# Patient Record
Sex: Female | Born: 1955
Health system: Southern US, Community
[De-identification: ages and names within clinical notes are randomized; demographics above are authoritative.]

## PROBLEM LIST (undated history)

## (undated) DIAGNOSIS — I10 Essential (primary) hypertension: Secondary | ICD-10-CM

## (undated) DIAGNOSIS — F1210 Cannabis abuse, uncomplicated: Secondary | ICD-10-CM

## (undated) DIAGNOSIS — I471 Supraventricular tachycardia, unspecified: Secondary | ICD-10-CM

## (undated) DIAGNOSIS — R079 Chest pain, unspecified: Secondary | ICD-10-CM

## (undated) DIAGNOSIS — M6530 Trigger finger, unspecified finger: Secondary | ICD-10-CM

## (undated) DIAGNOSIS — E7800 Pure hypercholesterolemia, unspecified: Secondary | ICD-10-CM

## (undated) DIAGNOSIS — E119 Type 2 diabetes mellitus without complications: Secondary | ICD-10-CM

## (undated) DIAGNOSIS — F329 Major depressive disorder, single episode, unspecified: Secondary | ICD-10-CM

## (undated) DIAGNOSIS — I340 Nonrheumatic mitral (valve) insufficiency: Secondary | ICD-10-CM

## (undated) DIAGNOSIS — F3181 Bipolar II disorder: Secondary | ICD-10-CM

## (undated) DIAGNOSIS — T7840XA Allergy, unspecified, initial encounter: Secondary | ICD-10-CM

## (undated) DIAGNOSIS — N289 Disorder of kidney and ureter, unspecified: Secondary | ICD-10-CM

## (undated) DIAGNOSIS — I351 Nonrheumatic aortic (valve) insufficiency: Secondary | ICD-10-CM

## (undated) DIAGNOSIS — I5042 Chronic combined systolic (congestive) and diastolic (congestive) heart failure: Secondary | ICD-10-CM

## (undated) DIAGNOSIS — I428 Other cardiomyopathies: Secondary | ICD-10-CM

## (undated) DIAGNOSIS — M1990 Unspecified osteoarthritis, unspecified site: Secondary | ICD-10-CM

## (undated) DIAGNOSIS — I071 Rheumatic tricuspid insufficiency: Secondary | ICD-10-CM

## (undated) DIAGNOSIS — F419 Anxiety disorder, unspecified: Secondary | ICD-10-CM

## (undated) DIAGNOSIS — H269 Unspecified cataract: Secondary | ICD-10-CM

## (undated) DIAGNOSIS — F32A Depression, unspecified: Secondary | ICD-10-CM

## (undated) DIAGNOSIS — K5900 Constipation, unspecified: Secondary | ICD-10-CM

## (undated) DIAGNOSIS — K219 Gastro-esophageal reflux disease without esophagitis: Secondary | ICD-10-CM

## (undated) DIAGNOSIS — F3170 Bipolar disorder, currently in remission, most recent episode unspecified: Secondary | ICD-10-CM

## (undated) HISTORY — DX: Major depressive disorder, single episode, unspecified: F32.9

## (undated) HISTORY — DX: Nonrheumatic mitral (valve) insufficiency: I34.0

## (undated) HISTORY — DX: Nonrheumatic aortic (valve) insufficiency: I35.1

## (undated) HISTORY — DX: Essential (primary) hypertension: I10

## (undated) HISTORY — DX: Allergy, unspecified, initial encounter: T78.40XA

## (undated) HISTORY — DX: Bipolar disorder, currently in remission, most recent episode unspecified: F31.70

## (undated) HISTORY — DX: Rheumatic tricuspid insufficiency: I07.1

## (undated) HISTORY — DX: Disorder of kidney and ureter, unspecified: N28.9

## (undated) HISTORY — DX: Other cardiomyopathies: I42.8

## (undated) HISTORY — PX: OTHER SURGICAL HISTORY: SHX169

## (undated) HISTORY — DX: Unspecified cataract: H26.9

## (undated) HISTORY — DX: Chest pain, unspecified: R07.9

## (undated) HISTORY — DX: Depression, unspecified: F32.A

## (undated) HISTORY — DX: Cannabis abuse, uncomplicated: F12.10

## (undated) HISTORY — DX: Pure hypercholesterolemia, unspecified: E78.00

## (undated) HISTORY — DX: Type 2 diabetes mellitus without complications: E11.9

## (undated) HISTORY — DX: Chronic combined systolic (congestive) and diastolic (congestive) heart failure: I50.42

## (undated) HISTORY — PX: CYSTECTOMY: SUR359

## (undated) HISTORY — DX: Trigger finger, unspecified finger: M65.30

## (undated) HISTORY — PX: COLONOSCOPY: SHX174

## (undated) HISTORY — DX: Constipation, unspecified: K59.00

## (undated) HISTORY — DX: Gastro-esophageal reflux disease without esophagitis: K21.9

## (undated) HISTORY — DX: Unspecified osteoarthritis, unspecified site: M19.90

## (undated) HISTORY — DX: Anxiety disorder, unspecified: F41.9

## (undated) HISTORY — DX: Supraventricular tachycardia: I47.1

## (undated) HISTORY — DX: Supraventricular tachycardia, unspecified: I47.10

## (undated) HISTORY — DX: Bipolar II disorder: F31.81

---

## 1984-06-16 HISTORY — PX: ABDOMINAL HYSTERECTOMY: SUR658

## 1998-12-28 ENCOUNTER — Emergency Department (HOSPITAL_COMMUNITY): Admission: EM | Admit: 1998-12-28 | Discharge: 1998-12-28 | Payer: Self-pay | Admitting: Emergency Medicine

## 1999-03-07 ENCOUNTER — Encounter: Payer: Self-pay | Admitting: Emergency Medicine

## 1999-03-07 ENCOUNTER — Emergency Department (HOSPITAL_COMMUNITY): Admission: EM | Admit: 1999-03-07 | Discharge: 1999-03-08 | Payer: Self-pay | Admitting: Emergency Medicine

## 2000-12-30 ENCOUNTER — Emergency Department (HOSPITAL_COMMUNITY): Admission: EM | Admit: 2000-12-30 | Discharge: 2000-12-30 | Payer: Self-pay | Admitting: Emergency Medicine

## 2001-04-15 ENCOUNTER — Ambulatory Visit (HOSPITAL_COMMUNITY): Admission: RE | Admit: 2001-04-15 | Discharge: 2001-04-15 | Payer: Self-pay | Admitting: Family Medicine

## 2001-09-13 ENCOUNTER — Emergency Department (HOSPITAL_COMMUNITY): Admission: EM | Admit: 2001-09-13 | Discharge: 2001-09-13 | Payer: Self-pay | Admitting: Emergency Medicine

## 2001-11-06 ENCOUNTER — Emergency Department (HOSPITAL_COMMUNITY): Admission: EM | Admit: 2001-11-06 | Discharge: 2001-11-06 | Payer: Self-pay | Admitting: Emergency Medicine

## 2001-12-21 ENCOUNTER — Emergency Department (HOSPITAL_COMMUNITY): Admission: EM | Admit: 2001-12-21 | Discharge: 2001-12-22 | Payer: Self-pay | Admitting: Emergency Medicine

## 2002-07-04 ENCOUNTER — Emergency Department (HOSPITAL_COMMUNITY): Admission: EM | Admit: 2002-07-04 | Discharge: 2002-07-04 | Payer: Self-pay | Admitting: Emergency Medicine

## 2002-07-06 ENCOUNTER — Emergency Department (HOSPITAL_COMMUNITY): Admission: EM | Admit: 2002-07-06 | Discharge: 2002-07-06 | Payer: Self-pay | Admitting: *Deleted

## 2003-06-03 ENCOUNTER — Inpatient Hospital Stay (HOSPITAL_COMMUNITY): Admission: EM | Admit: 2003-06-03 | Discharge: 2003-06-07 | Payer: Self-pay | Admitting: Emergency Medicine

## 2003-07-11 ENCOUNTER — Emergency Department (HOSPITAL_COMMUNITY): Admission: EM | Admit: 2003-07-11 | Discharge: 2003-07-11 | Payer: Self-pay | Admitting: Family Medicine

## 2003-08-07 ENCOUNTER — Encounter: Payer: Self-pay | Admitting: Emergency Medicine

## 2003-08-07 ENCOUNTER — Inpatient Hospital Stay (HOSPITAL_COMMUNITY): Admission: AD | Admit: 2003-08-07 | Discharge: 2003-08-10 | Payer: Self-pay | Admitting: Internal Medicine

## 2003-09-28 ENCOUNTER — Inpatient Hospital Stay (HOSPITAL_COMMUNITY): Admission: EM | Admit: 2003-09-28 | Discharge: 2003-09-30 | Payer: Self-pay | Admitting: Emergency Medicine

## 2003-10-02 ENCOUNTER — Encounter: Admission: RE | Admit: 2003-10-02 | Discharge: 2003-10-02 | Payer: Self-pay | Admitting: Family Medicine

## 2003-10-09 ENCOUNTER — Encounter: Admission: RE | Admit: 2003-10-09 | Discharge: 2003-10-09 | Payer: Self-pay | Admitting: Family Medicine

## 2003-10-20 ENCOUNTER — Encounter: Admission: RE | Admit: 2003-10-20 | Discharge: 2003-10-20 | Payer: Self-pay | Admitting: Family Medicine

## 2003-10-30 ENCOUNTER — Encounter: Admission: RE | Admit: 2003-10-30 | Discharge: 2003-10-30 | Payer: Self-pay | Admitting: Family Medicine

## 2003-10-30 ENCOUNTER — Encounter: Admission: RE | Admit: 2003-10-30 | Discharge: 2004-01-28 | Payer: Self-pay | Admitting: Family Medicine

## 2004-02-13 ENCOUNTER — Encounter: Admission: RE | Admit: 2004-02-13 | Discharge: 2004-02-13 | Payer: Self-pay | Admitting: Family Medicine

## 2004-04-24 ENCOUNTER — Ambulatory Visit: Payer: Self-pay | Admitting: Family Medicine

## 2004-04-24 ENCOUNTER — Observation Stay (HOSPITAL_COMMUNITY): Admission: EM | Admit: 2004-04-24 | Discharge: 2004-04-26 | Payer: Self-pay | Admitting: Emergency Medicine

## 2004-05-06 ENCOUNTER — Ambulatory Visit: Payer: Self-pay | Admitting: Sports Medicine

## 2004-05-06 HISTORY — PX: CARDIAC CATHETERIZATION: SHX172

## 2004-06-21 ENCOUNTER — Ambulatory Visit: Payer: Self-pay | Admitting: Family Medicine

## 2004-09-17 ENCOUNTER — Ambulatory Visit: Payer: Self-pay | Admitting: Family Medicine

## 2004-10-02 ENCOUNTER — Encounter: Admission: RE | Admit: 2004-10-02 | Discharge: 2004-10-02 | Payer: Self-pay | Admitting: Sports Medicine

## 2004-11-08 ENCOUNTER — Ambulatory Visit: Payer: Self-pay | Admitting: Family Medicine

## 2005-02-12 HISTORY — PX: OTHER SURGICAL HISTORY: SHX169

## 2005-06-30 ENCOUNTER — Ambulatory Visit: Payer: Self-pay | Admitting: Sports Medicine

## 2005-08-08 ENCOUNTER — Emergency Department (HOSPITAL_COMMUNITY): Admission: EM | Admit: 2005-08-08 | Discharge: 2005-08-08 | Payer: Self-pay | Admitting: Emergency Medicine

## 2005-08-12 ENCOUNTER — Ambulatory Visit: Payer: Self-pay | Admitting: Family Medicine

## 2005-12-14 ENCOUNTER — Emergency Department (HOSPITAL_COMMUNITY): Admission: EM | Admit: 2005-12-14 | Discharge: 2005-12-14 | Payer: Self-pay | Admitting: Family Medicine

## 2006-04-10 ENCOUNTER — Emergency Department (HOSPITAL_COMMUNITY): Admission: EM | Admit: 2006-04-10 | Discharge: 2006-04-10 | Payer: Self-pay | Admitting: Emergency Medicine

## 2006-04-13 ENCOUNTER — Emergency Department (HOSPITAL_COMMUNITY): Admission: EM | Admit: 2006-04-13 | Discharge: 2006-04-13 | Payer: Self-pay | Admitting: Family Medicine

## 2006-04-15 ENCOUNTER — Ambulatory Visit: Payer: Self-pay | Admitting: Family Medicine

## 2006-04-28 ENCOUNTER — Ambulatory Visit: Payer: Self-pay | Admitting: Family Medicine

## 2006-05-20 ENCOUNTER — Ambulatory Visit: Payer: Self-pay | Admitting: Family Medicine

## 2006-08-11 ENCOUNTER — Encounter (INDEPENDENT_AMBULATORY_CARE_PROVIDER_SITE_OTHER): Payer: Self-pay | Admitting: Family Medicine

## 2006-08-11 ENCOUNTER — Ambulatory Visit: Payer: Self-pay | Admitting: Family Medicine

## 2006-08-11 LAB — CONVERTED CEMR LAB
Calcium: 9.2 mg/dL (ref 8.4–10.5)
Potassium: 3.9 meq/L (ref 3.5–5.3)
Sodium: 141 meq/L (ref 135–145)

## 2006-08-13 DIAGNOSIS — G4700 Insomnia, unspecified: Secondary | ICD-10-CM | POA: Insufficient documentation

## 2006-08-13 DIAGNOSIS — G44209 Tension-type headache, unspecified, not intractable: Secondary | ICD-10-CM | POA: Insufficient documentation

## 2006-08-13 DIAGNOSIS — E1169 Type 2 diabetes mellitus with other specified complication: Secondary | ICD-10-CM | POA: Insufficient documentation

## 2006-08-13 DIAGNOSIS — F339 Major depressive disorder, recurrent, unspecified: Secondary | ICD-10-CM | POA: Insufficient documentation

## 2006-08-13 DIAGNOSIS — E785 Hyperlipidemia, unspecified: Secondary | ICD-10-CM

## 2006-08-13 DIAGNOSIS — K219 Gastro-esophageal reflux disease without esophagitis: Secondary | ICD-10-CM | POA: Insufficient documentation

## 2006-08-14 ENCOUNTER — Inpatient Hospital Stay (HOSPITAL_COMMUNITY): Admission: AD | Admit: 2006-08-14 | Discharge: 2006-08-15 | Payer: Self-pay

## 2006-08-14 ENCOUNTER — Emergency Department (HOSPITAL_COMMUNITY): Admission: EM | Admit: 2006-08-14 | Discharge: 2006-08-14 | Payer: Self-pay | Admitting: Emergency Medicine

## 2006-08-18 ENCOUNTER — Encounter (INDEPENDENT_AMBULATORY_CARE_PROVIDER_SITE_OTHER): Payer: Self-pay | Admitting: Family Medicine

## 2006-08-18 ENCOUNTER — Ambulatory Visit: Payer: Self-pay | Admitting: Family Medicine

## 2006-08-18 DIAGNOSIS — F17200 Nicotine dependence, unspecified, uncomplicated: Secondary | ICD-10-CM

## 2006-08-18 DIAGNOSIS — IMO0002 Reserved for concepts with insufficient information to code with codable children: Secondary | ICD-10-CM | POA: Insufficient documentation

## 2006-08-18 DIAGNOSIS — E1165 Type 2 diabetes mellitus with hyperglycemia: Secondary | ICD-10-CM

## 2006-08-18 DIAGNOSIS — Z87891 Personal history of nicotine dependence: Secondary | ICD-10-CM | POA: Insufficient documentation

## 2006-08-18 LAB — CONVERTED CEMR LAB
ALT: 10 units/L (ref 0–35)
Albumin: 4.1 g/dL (ref 3.5–5.2)
CO2: 24 meq/L (ref 19–32)
Calcium: 9.6 mg/dL (ref 8.4–10.5)
Chloride: 103 meq/L (ref 96–112)
Glucose, Bld: 181 mg/dL — ABNORMAL HIGH (ref 70–99)
Potassium: 4.5 meq/L (ref 3.5–5.3)
Sodium: 137 meq/L (ref 135–145)
Total Bilirubin: 0.3 mg/dL (ref 0.3–1.2)
Total CK: 40 units/L (ref 7–177)
Total Protein: 8 g/dL (ref 6.0–8.3)

## 2006-08-28 ENCOUNTER — Ambulatory Visit: Payer: Self-pay | Admitting: Family Medicine

## 2006-09-03 ENCOUNTER — Ambulatory Visit: Payer: Self-pay | Admitting: Family Medicine

## 2006-09-09 ENCOUNTER — Telehealth: Payer: Self-pay | Admitting: *Deleted

## 2006-09-10 ENCOUNTER — Ambulatory Visit: Payer: Self-pay | Admitting: Family Medicine

## 2006-09-22 ENCOUNTER — Encounter (INDEPENDENT_AMBULATORY_CARE_PROVIDER_SITE_OTHER): Payer: Self-pay | Admitting: *Deleted

## 2006-09-22 ENCOUNTER — Ambulatory Visit: Payer: Self-pay | Admitting: Sports Medicine

## 2006-12-23 ENCOUNTER — Ambulatory Visit: Payer: Self-pay | Admitting: Family Medicine

## 2007-01-14 ENCOUNTER — Encounter (INDEPENDENT_AMBULATORY_CARE_PROVIDER_SITE_OTHER): Payer: Self-pay | Admitting: Family Medicine

## 2007-01-29 ENCOUNTER — Ambulatory Visit: Payer: Self-pay | Admitting: Family Medicine

## 2007-01-29 DIAGNOSIS — E1143 Type 2 diabetes mellitus with diabetic autonomic (poly)neuropathy: Secondary | ICD-10-CM | POA: Insufficient documentation

## 2007-02-01 ENCOUNTER — Encounter (INDEPENDENT_AMBULATORY_CARE_PROVIDER_SITE_OTHER): Payer: Self-pay | Admitting: Family Medicine

## 2007-02-03 ENCOUNTER — Encounter (INDEPENDENT_AMBULATORY_CARE_PROVIDER_SITE_OTHER): Payer: Self-pay | Admitting: Family Medicine

## 2007-02-22 ENCOUNTER — Encounter (INDEPENDENT_AMBULATORY_CARE_PROVIDER_SITE_OTHER): Payer: Self-pay | Admitting: Family Medicine

## 2007-05-08 ENCOUNTER — Emergency Department (HOSPITAL_COMMUNITY): Admission: EM | Admit: 2007-05-08 | Discharge: 2007-05-08 | Payer: Self-pay | Admitting: Family Medicine

## 2007-05-11 ENCOUNTER — Emergency Department (HOSPITAL_COMMUNITY): Admission: EM | Admit: 2007-05-11 | Discharge: 2007-05-11 | Payer: Self-pay | Admitting: Emergency Medicine

## 2007-05-17 ENCOUNTER — Ambulatory Visit: Payer: Self-pay | Admitting: Sports Medicine

## 2007-05-18 ENCOUNTER — Ambulatory Visit: Payer: Self-pay | Admitting: Sports Medicine

## 2007-05-25 ENCOUNTER — Ambulatory Visit: Payer: Self-pay | Admitting: Family Medicine

## 2007-06-07 ENCOUNTER — Ambulatory Visit: Payer: Self-pay | Admitting: Sports Medicine

## 2008-02-02 ENCOUNTER — Telehealth: Payer: Self-pay | Admitting: *Deleted

## 2008-02-14 ENCOUNTER — Emergency Department (HOSPITAL_COMMUNITY): Admission: EM | Admit: 2008-02-14 | Discharge: 2008-02-14 | Payer: Self-pay | Admitting: Emergency Medicine

## 2008-02-16 ENCOUNTER — Ambulatory Visit: Payer: Self-pay | Admitting: Family Medicine

## 2008-03-30 ENCOUNTER — Telehealth (INDEPENDENT_AMBULATORY_CARE_PROVIDER_SITE_OTHER): Payer: Self-pay | Admitting: *Deleted

## 2008-04-19 ENCOUNTER — Telehealth: Payer: Self-pay | Admitting: Family Medicine

## 2008-04-19 ENCOUNTER — Ambulatory Visit: Payer: Self-pay | Admitting: Family Medicine

## 2008-04-21 ENCOUNTER — Encounter: Payer: Self-pay | Admitting: Family Medicine

## 2008-05-03 ENCOUNTER — Telehealth: Payer: Self-pay | Admitting: *Deleted

## 2008-05-04 ENCOUNTER — Ambulatory Visit: Payer: Self-pay | Admitting: Sports Medicine

## 2008-05-04 ENCOUNTER — Encounter (INDEPENDENT_AMBULATORY_CARE_PROVIDER_SITE_OTHER): Payer: Self-pay | Admitting: Family Medicine

## 2008-05-04 DIAGNOSIS — M25549 Pain in joints of unspecified hand: Secondary | ICD-10-CM | POA: Insufficient documentation

## 2008-05-05 ENCOUNTER — Encounter: Payer: Self-pay | Admitting: Family Medicine

## 2008-07-10 ENCOUNTER — Ambulatory Visit: Payer: Self-pay | Admitting: Family Medicine

## 2008-07-10 ENCOUNTER — Ambulatory Visit (HOSPITAL_COMMUNITY): Admission: RE | Admit: 2008-07-10 | Discharge: 2008-07-10 | Payer: Self-pay | Admitting: Family Medicine

## 2008-07-14 ENCOUNTER — Encounter: Payer: Self-pay | Admitting: Family Medicine

## 2008-08-01 ENCOUNTER — Encounter: Payer: Self-pay | Admitting: Family Medicine

## 2008-08-01 ENCOUNTER — Ambulatory Visit: Payer: Self-pay | Admitting: Family Medicine

## 2008-08-01 LAB — CONVERTED CEMR LAB
ALT: 23 units/L (ref 0–35)
AST: 16 units/L (ref 0–37)
Albumin: 4.4 g/dL (ref 3.5–5.2)
Alkaline Phosphatase: 147 units/L — ABNORMAL HIGH (ref 39–117)
BUN: 15 mg/dL (ref 6–23)
Chlamydia, DNA Probe: NEGATIVE
GC Probe Amp, Genital: NEGATIVE
HDL: 58 mg/dL (ref >39)
LDL Cholesterol: 173 mg/dL — ABNORMAL HIGH (ref 0–99)
Potassium: 4 meq/L (ref 3.5–5.3)
TSH: 2.062 microintl units/mL (ref 0.350–4.50)
Total CHOL/HDL Ratio: 4.7

## 2008-08-21 ENCOUNTER — Ambulatory Visit: Payer: Self-pay | Admitting: Family Medicine

## 2008-08-21 DIAGNOSIS — M129 Arthropathy, unspecified: Secondary | ICD-10-CM | POA: Insufficient documentation

## 2008-08-23 ENCOUNTER — Encounter: Payer: Self-pay | Admitting: Family Medicine

## 2008-08-23 ENCOUNTER — Ambulatory Visit: Payer: Self-pay | Admitting: Family Medicine

## 2008-09-25 ENCOUNTER — Encounter: Payer: Self-pay | Admitting: Family Medicine

## 2009-01-24 ENCOUNTER — Ambulatory Visit: Payer: Self-pay | Admitting: Family Medicine

## 2009-01-31 ENCOUNTER — Ambulatory Visit: Payer: Self-pay | Admitting: Family Medicine

## 2009-01-31 ENCOUNTER — Encounter: Payer: Self-pay | Admitting: Family Medicine

## 2009-02-05 ENCOUNTER — Ambulatory Visit: Payer: Self-pay

## 2009-02-05 DIAGNOSIS — M6530 Trigger finger, unspecified finger: Secondary | ICD-10-CM | POA: Insufficient documentation

## 2009-02-05 DIAGNOSIS — M654 Radial styloid tenosynovitis [de Quervain]: Secondary | ICD-10-CM | POA: Insufficient documentation

## 2009-02-12 ENCOUNTER — Encounter: Payer: Self-pay | Admitting: *Deleted

## 2009-02-14 ENCOUNTER — Ambulatory Visit: Payer: Self-pay | Admitting: Family Medicine

## 2009-03-01 ENCOUNTER — Ambulatory Visit: Payer: Self-pay | Admitting: Family Medicine

## 2009-03-05 ENCOUNTER — Ambulatory Visit: Payer: Self-pay | Admitting: Family Medicine

## 2009-03-06 ENCOUNTER — Encounter: Payer: Self-pay | Admitting: *Deleted

## 2009-03-12 ENCOUNTER — Ambulatory Visit: Payer: Self-pay | Admitting: Family Medicine

## 2009-03-12 ENCOUNTER — Encounter: Payer: Self-pay | Admitting: Family Medicine

## 2009-03-14 ENCOUNTER — Ambulatory Visit (HOSPITAL_COMMUNITY): Admission: RE | Admit: 2009-03-14 | Discharge: 2009-03-14 | Payer: Self-pay | Admitting: Family Medicine

## 2009-03-22 ENCOUNTER — Ambulatory Visit: Payer: Self-pay | Admitting: Family Medicine

## 2009-03-22 DIAGNOSIS — K3184 Gastroparesis: Secondary | ICD-10-CM | POA: Insufficient documentation

## 2009-03-23 ENCOUNTER — Encounter: Payer: Self-pay | Admitting: Family Medicine

## 2009-04-27 ENCOUNTER — Ambulatory Visit: Payer: Self-pay | Admitting: Family Medicine

## 2009-05-14 ENCOUNTER — Encounter: Admission: RE | Admit: 2009-05-14 | Discharge: 2009-06-13 | Payer: Self-pay | Admitting: Family Medicine

## 2009-05-15 ENCOUNTER — Encounter: Payer: Self-pay | Admitting: Family Medicine

## 2009-05-16 ENCOUNTER — Emergency Department (HOSPITAL_COMMUNITY): Admission: EM | Admit: 2009-05-16 | Discharge: 2009-05-17 | Payer: Self-pay | Admitting: Emergency Medicine

## 2009-05-18 ENCOUNTER — Emergency Department (HOSPITAL_COMMUNITY): Admission: EM | Admit: 2009-05-18 | Discharge: 2009-05-18 | Payer: Self-pay | Admitting: Emergency Medicine

## 2009-06-04 ENCOUNTER — Ambulatory Visit: Payer: Self-pay | Admitting: Family Medicine

## 2009-06-04 DIAGNOSIS — M7500 Adhesive capsulitis of unspecified shoulder: Secondary | ICD-10-CM | POA: Insufficient documentation

## 2009-06-21 ENCOUNTER — Encounter: Admission: RE | Admit: 2009-06-21 | Discharge: 2009-09-19 | Payer: Self-pay | Admitting: Family Medicine

## 2009-07-03 ENCOUNTER — Encounter: Payer: Self-pay | Admitting: Family Medicine

## 2009-07-03 LAB — HM DIABETES EYE EXAM

## 2009-07-04 ENCOUNTER — Encounter: Admission: RE | Admit: 2009-07-04 | Discharge: 2009-08-15 | Payer: Self-pay | Admitting: Family Medicine

## 2009-07-18 ENCOUNTER — Ambulatory Visit: Payer: Self-pay | Admitting: Family Medicine

## 2009-07-18 ENCOUNTER — Encounter: Payer: Self-pay | Admitting: Family Medicine

## 2009-08-06 ENCOUNTER — Encounter: Payer: Self-pay | Admitting: *Deleted

## 2009-08-06 ENCOUNTER — Ambulatory Visit: Payer: Self-pay | Admitting: Family Medicine

## 2009-08-06 DIAGNOSIS — M797 Fibromyalgia: Secondary | ICD-10-CM | POA: Insufficient documentation

## 2009-08-10 ENCOUNTER — Ambulatory Visit: Payer: Self-pay | Admitting: Family Medicine

## 2009-08-16 ENCOUNTER — Encounter: Payer: Self-pay | Admitting: Family Medicine

## 2009-08-23 ENCOUNTER — Encounter: Payer: Self-pay | Admitting: Family Medicine

## 2009-08-23 DIAGNOSIS — F3189 Other bipolar disorder: Secondary | ICD-10-CM | POA: Insufficient documentation

## 2009-08-29 ENCOUNTER — Ambulatory Visit: Payer: Self-pay | Admitting: Family Medicine

## 2009-08-29 ENCOUNTER — Encounter: Payer: Self-pay | Admitting: Family Medicine

## 2009-08-29 ENCOUNTER — Telehealth: Payer: Self-pay | Admitting: Family Medicine

## 2009-08-29 DIAGNOSIS — L608 Other nail disorders: Secondary | ICD-10-CM | POA: Insufficient documentation

## 2009-08-30 ENCOUNTER — Encounter: Payer: Self-pay | Admitting: Family Medicine

## 2009-11-05 ENCOUNTER — Ambulatory Visit: Payer: Self-pay | Admitting: Family Medicine

## 2009-11-16 ENCOUNTER — Ambulatory Visit: Payer: Self-pay | Admitting: Family Medicine

## 2009-12-08 ENCOUNTER — Emergency Department (HOSPITAL_COMMUNITY): Admission: EM | Admit: 2009-12-08 | Discharge: 2009-12-08 | Payer: Self-pay | Admitting: Family Medicine

## 2009-12-11 ENCOUNTER — Encounter: Payer: Self-pay | Admitting: Family Medicine

## 2009-12-20 ENCOUNTER — Encounter: Payer: Self-pay | Admitting: Family Medicine

## 2009-12-20 ENCOUNTER — Ambulatory Visit: Payer: Self-pay | Admitting: Family Medicine

## 2009-12-20 DIAGNOSIS — L293 Anogenital pruritus, unspecified: Secondary | ICD-10-CM | POA: Insufficient documentation

## 2009-12-20 LAB — CONVERTED CEMR LAB: Whiff Test: NEGATIVE

## 2009-12-24 ENCOUNTER — Telehealth: Payer: Self-pay | Admitting: Family Medicine

## 2009-12-24 LAB — CONVERTED CEMR LAB
Chloride: 101 meq/L (ref 96–112)
Creatinine, Ser: 0.74 mg/dL (ref 0.40–1.20)
HDL: 52 mg/dL (ref >39)
LDL Cholesterol: 218 mg/dL — ABNORMAL HIGH (ref 0–99)
Potassium: 4.6 meq/L (ref 3.5–5.3)
Sodium: 138 meq/L (ref 135–145)
Triglycerides: 85 mg/dL (ref <150)
VLDL: 17 mg/dL (ref 0–40)

## 2009-12-27 ENCOUNTER — Ambulatory Visit: Payer: Self-pay | Admitting: Family Medicine

## 2009-12-27 ENCOUNTER — Encounter: Payer: Self-pay | Admitting: Family Medicine

## 2009-12-27 LAB — CONVERTED CEMR LAB
AST: 14 units/L (ref 0–37)
Bilirubin, Direct: 0.1 mg/dL (ref 0.0–0.3)
Total Bilirubin: 0.6 mg/dL (ref 0.3–1.2)
Total CHOL/HDL Ratio: 4.8

## 2010-01-02 ENCOUNTER — Encounter: Payer: Self-pay | Admitting: Family Medicine

## 2010-01-02 ENCOUNTER — Telehealth: Payer: Self-pay | Admitting: Family Medicine

## 2010-01-02 ENCOUNTER — Ambulatory Visit: Payer: Self-pay | Admitting: Family Medicine

## 2010-01-02 LAB — CONVERTED CEMR LAB
BUN: 10 mg/dL (ref 6–23)
Chloride: 102 meq/L (ref 96–112)
Glucose, Urine, Semiquant: >=1000
Potassium: 3.5 meq/L (ref 3.5–5.3)

## 2010-01-07 ENCOUNTER — Telehealth: Payer: Self-pay | Admitting: Family Medicine

## 2010-01-17 ENCOUNTER — Ambulatory Visit: Payer: Self-pay | Admitting: Family Medicine

## 2010-01-17 DIAGNOSIS — H5310 Unspecified subjective visual disturbances: Secondary | ICD-10-CM | POA: Insufficient documentation

## 2010-01-17 LAB — HM DIABETES FOOT EXAM

## 2010-01-30 ENCOUNTER — Ambulatory Visit: Payer: Self-pay | Admitting: Family Medicine

## 2010-02-08 ENCOUNTER — Encounter: Payer: Self-pay | Admitting: *Deleted

## 2010-02-19 ENCOUNTER — Encounter: Payer: Self-pay | Admitting: Family Medicine

## 2010-02-19 ENCOUNTER — Ambulatory Visit: Payer: Self-pay | Admitting: Family Medicine

## 2010-02-19 LAB — CONVERTED CEMR LAB
ALT: 10 units/L (ref 0–35)
AST: 12 units/L (ref 0–37)
Albumin/Creatinine Ratio, Urine, POC: <30
Albumin: 4 g/dL (ref 3.5–5.2)
Alkaline Phosphatase: 100 units/L (ref 39–117)
Creatinine,U: 100 mg/dL
Glucose, Bld: 74 mg/dL (ref 70–99)
Microalbumin U total vol: 30 mg/L
Potassium: 4.4 meq/L (ref 3.5–5.3)
Sodium: 142 meq/L (ref 135–145)
Total Protein: 7.8 g/dL (ref 6.0–8.3)

## 2010-03-01 ENCOUNTER — Encounter (INDEPENDENT_AMBULATORY_CARE_PROVIDER_SITE_OTHER): Payer: Self-pay | Admitting: *Deleted

## 2010-03-01 ENCOUNTER — Ambulatory Visit: Payer: Self-pay

## 2010-04-13 ENCOUNTER — Emergency Department (HOSPITAL_COMMUNITY): Admission: EM | Admit: 2010-04-13 | Discharge: 2010-04-13 | Payer: Self-pay | Admitting: Emergency Medicine

## 2010-04-15 ENCOUNTER — Encounter (INDEPENDENT_AMBULATORY_CARE_PROVIDER_SITE_OTHER): Payer: Self-pay | Admitting: *Deleted

## 2010-04-15 ENCOUNTER — Ambulatory Visit: Payer: Self-pay | Admitting: Family Medicine

## 2010-04-22 ENCOUNTER — Telehealth: Payer: Self-pay | Admitting: *Deleted

## 2010-04-23 ENCOUNTER — Ambulatory Visit: Payer: Self-pay | Admitting: Family Medicine

## 2010-05-13 ENCOUNTER — Encounter
Admission: RE | Admit: 2010-05-13 | Discharge: 2010-06-13 | Payer: Self-pay | Source: Home / Self Care | Attending: Family Medicine | Admitting: Family Medicine

## 2010-05-22 ENCOUNTER — Ambulatory Visit: Payer: Self-pay | Admitting: Family Medicine

## 2010-05-22 ENCOUNTER — Ambulatory Visit (HOSPITAL_COMMUNITY)
Admission: RE | Admit: 2010-05-22 | Discharge: 2010-05-22 | Payer: Self-pay | Source: Home / Self Care | Admitting: Family Medicine

## 2010-05-22 ENCOUNTER — Encounter: Payer: Self-pay | Admitting: Family Medicine

## 2010-05-22 LAB — CONVERTED CEMR LAB
Alkaline Phosphatase: 110 units/L (ref 39–117)
BUN: 10 mg/dL (ref 6–23)
Creatinine, Ser: 0.64 mg/dL (ref 0.40–1.20)
Glucose, Bld: 195 mg/dL — ABNORMAL HIGH (ref 70–99)
Total Bilirubin: 0.5 mg/dL (ref 0.3–1.2)

## 2010-05-29 ENCOUNTER — Ambulatory Visit: Payer: Self-pay | Admitting: Family Medicine

## 2010-05-29 ENCOUNTER — Encounter: Payer: Self-pay | Admitting: Family Medicine

## 2010-05-29 LAB — CONVERTED CEMR LAB
Amphetamine Screen, Ur: NEGATIVE
Barbiturate Quant, Ur: NEGATIVE
Marijuana Metabolite: POSITIVE — AB
Methadone: NEGATIVE
Opiates: NEGATIVE
Propoxyphene: NEGATIVE

## 2010-06-11 ENCOUNTER — Encounter: Payer: Self-pay | Admitting: Family Medicine

## 2010-06-12 ENCOUNTER — Ambulatory Visit: Payer: Self-pay | Admitting: Family Medicine

## 2010-06-12 ENCOUNTER — Encounter: Payer: Self-pay | Admitting: Family Medicine

## 2010-06-12 LAB — CONVERTED CEMR LAB
AST: 11 units/L (ref 0–37)
Albumin: 4 g/dL (ref 3.5–5.2)
Alkaline Phosphatase: 109 units/L (ref 39–117)
BUN: 12 mg/dL (ref 6–23)
Creatinine, Ser: 0.75 mg/dL (ref 0.40–1.20)
Glucose, Bld: 133 mg/dL — ABNORMAL HIGH (ref 70–99)
Potassium: 3.6 meq/L (ref 3.5–5.3)
Total Bilirubin: 0.3 mg/dL (ref 0.3–1.2)

## 2010-06-13 ENCOUNTER — Encounter (INDEPENDENT_AMBULATORY_CARE_PROVIDER_SITE_OTHER): Payer: Self-pay | Admitting: *Deleted

## 2010-06-13 ENCOUNTER — Encounter: Payer: Self-pay | Admitting: Family Medicine

## 2010-06-14 ENCOUNTER — Telehealth: Payer: Self-pay | Admitting: Family Medicine

## 2010-06-14 ENCOUNTER — Encounter: Payer: Self-pay | Admitting: Family Medicine

## 2010-06-17 ENCOUNTER — Encounter: Payer: Self-pay | Admitting: Family Medicine

## 2010-06-17 DIAGNOSIS — I10 Essential (primary) hypertension: Secondary | ICD-10-CM | POA: Insufficient documentation

## 2010-06-18 ENCOUNTER — Encounter: Payer: Self-pay | Admitting: Family Medicine

## 2010-06-18 ENCOUNTER — Ambulatory Visit: Admission: RE | Admit: 2010-06-18 | Discharge: 2010-06-18 | Payer: Self-pay | Source: Home / Self Care

## 2010-06-18 LAB — CONVERTED CEMR LAB
Bilirubin Urine: NEGATIVE
CO2: 25 meq/L (ref 19–32)
Calcium: 9.7 mg/dL (ref 8.4–10.5)
Glucose, Urine, Semiquant: NEGATIVE
Ketones, urine, test strip: NEGATIVE
Potassium: 3.5 meq/L (ref 3.5–5.3)
Protein, U semiquant: NEGATIVE
Sodium: 137 meq/L (ref 135–145)
pH: 5.5

## 2010-06-21 ENCOUNTER — Ambulatory Visit: Admission: RE | Admit: 2010-06-21 | Discharge: 2010-06-21 | Payer: Self-pay | Source: Home / Self Care

## 2010-06-21 ENCOUNTER — Encounter: Payer: Self-pay | Admitting: Family Medicine

## 2010-06-21 LAB — CONVERTED CEMR LAB: Lithium Lvl: 0.34 meq/L — ABNORMAL LOW (ref 0.80–1.40)

## 2010-06-27 ENCOUNTER — Emergency Department (HOSPITAL_COMMUNITY)
Admission: EM | Admit: 2010-06-27 | Discharge: 2010-06-28 | Payer: Self-pay | Source: Home / Self Care | Admitting: Emergency Medicine

## 2010-07-01 LAB — COMPREHENSIVE METABOLIC PANEL
ALT: 9 U/L (ref 0–35)
AST: 14 U/L (ref 0–37)
Albumin: 3.4 g/dL — ABNORMAL LOW (ref 3.5–5.2)
Alkaline Phosphatase: 101 U/L (ref 39–117)
BUN: 15 mg/dL (ref 6–23)
CO2: 29 mEq/L (ref 19–32)
Calcium: 9.8 mg/dL (ref 8.4–10.5)
Chloride: 101 mEq/L (ref 96–112)
Creatinine, Ser: 1.27 mg/dL — ABNORMAL HIGH (ref 0.4–1.2)
GFR calc Af Amer: 53 mL/min — ABNORMAL LOW (ref >60)
GFR calc non Af Amer: 44 mL/min — ABNORMAL LOW (ref >60)
Glucose, Bld: 164 mg/dL — ABNORMAL HIGH (ref 70–99)
Potassium: 3.3 mEq/L — ABNORMAL LOW (ref 3.5–5.1)
Sodium: 141 mEq/L (ref 135–145)
Total Bilirubin: 0.4 mg/dL (ref 0.3–1.2)
Total Protein: 8 g/dL (ref 6.0–8.3)

## 2010-07-01 LAB — CBC
HCT: 33 % — ABNORMAL LOW (ref 36.0–46.0)
Hemoglobin: 11 g/dL — ABNORMAL LOW (ref 12.0–15.0)
MCH: 31 pg (ref 26.0–34.0)
MCHC: 33.3 g/dL (ref 30.0–36.0)
MCV: 93 fL (ref 78.0–100.0)
Platelets: 372 10*3/uL (ref 150–400)
RBC: 3.55 MIL/uL — ABNORMAL LOW (ref 3.87–5.11)
RDW: 13.3 % (ref 11.5–15.5)
WBC: 8.7 10*3/uL (ref 4.0–10.5)

## 2010-07-01 LAB — CARDIAC PANEL(CRET KIN+CKTOT+MB+TROPI)
CK, MB: 0.6 ng/mL (ref 0.3–4.0)
Relative Index: INVALID (ref 0.0–2.5)
Total CK: 64 U/L (ref 7–177)
Troponin I: 0.02 ng/mL (ref 0.00–0.06)

## 2010-07-01 LAB — DIFFERENTIAL
Basophils Absolute: 0 10*3/uL (ref 0.0–0.1)
Basophils Relative: 0 % (ref 0–1)
Eosinophils Absolute: 0.1 10*3/uL (ref 0.0–0.7)
Eosinophils Relative: 1 % (ref 0–5)
Lymphocytes Relative: 25 % (ref 12–46)
Lymphs Abs: 2.2 10*3/uL (ref 0.7–4.0)
Monocytes Absolute: 0.7 10*3/uL (ref 0.1–1.0)
Monocytes Relative: 8 % (ref 3–12)
Neutro Abs: 5.6 10*3/uL (ref 1.7–7.7)
Neutrophils Relative %: 65 % (ref 43–77)

## 2010-07-01 LAB — URINE CULTURE
Colony Count: >=100000
Culture  Setup Time: 201201131025

## 2010-07-01 LAB — GLUCOSE, CAPILLARY: Glucose-Capillary: 197 mg/dL — ABNORMAL HIGH (ref 70–99)

## 2010-07-01 LAB — LIPASE, BLOOD: Lipase: 19 U/L (ref 11–59)

## 2010-07-01 LAB — URINE MICROSCOPIC-ADD ON

## 2010-07-01 LAB — URINALYSIS, ROUTINE W REFLEX MICROSCOPIC
Bilirubin Urine: NEGATIVE
Hgb urine dipstick: NEGATIVE
Ketones, ur: NEGATIVE mg/dL
Nitrite: NEGATIVE
Protein, ur: NEGATIVE mg/dL
Specific Gravity, Urine: 1.014 (ref 1.005–1.030)
Urine Glucose, Fasting: NEGATIVE mg/dL
Urobilinogen, UA: 1 mg/dL (ref 0.0–1.0)
pH: 5.5 (ref 5.0–8.0)

## 2010-07-01 LAB — POCT PREGNANCY, URINE: Preg Test, Ur: NEGATIVE

## 2010-07-03 ENCOUNTER — Ambulatory Visit: Admit: 2010-07-03 | Payer: Self-pay

## 2010-07-08 ENCOUNTER — Telehealth (INDEPENDENT_AMBULATORY_CARE_PROVIDER_SITE_OTHER): Payer: Self-pay | Admitting: *Deleted

## 2010-07-16 NOTE — Assessment & Plan Note (Signed)
Summary: rt shoulder frozen/u/a to use hand or arm/bmc   Vital Signs:  Patient profile:   55 year old female Pulse rate:   102 / minute BP sitting:   182 / 101  (left arm)  Vitals Entered By: Caesar Chestnut RN (April 15, 2010 3:44 PM) CC: Rt shoulder pain x3 days, unable to raise arm   Primary Care Provider:  Shanda Howells MD  CC:  Rt shoulder pain x3 days and unable to raise arm.  History of Present Illness: 3 or more days worsening right shoulder and neck pain.  Was seen at ED and had negative neck x rays. No specific injury. Pain is 9/10 worse with any movement. has had left shoulder issue inpast and did well with PT. This feels similar but worse. Rest does not make it significantly better. Right hand dominant.  Habits & Providers  Alcohol-Tobacco-Diet     Tobacco Status: current     Tobacco Counseling: to quit use of tobacco products     Cigarette Packs/Day: 0.75  Current Medications (verified): 1)  Adult Aspirin Low Strength 81 Mg  Tbdp (Aspirin) .... One Tab By Mouth Qday 2)  Lantus 100 Unit/ml Soln (Insulin Glargine) .... Take 30 Units Daily in Am 3)  Miralax  Powd (Polyethylene Glycol 3350) .Marland KitchenMarland KitchenMarland Kitchen 17 Gm in 8 Oz Fluid Po Daily Until Regular Then As Needed For Constipation 4)  Ranitidine Hcl 150 Mg Caps (Ranitidine Hcl) .... One By Mouth Two Times A Day 5)  Nitrostat 0.3 Mg Subl (Nitroglycerin) .... Take Under Tongue As Needed For Chest Pain, May Take Up To 3 Times Every Five Minutes. 6)  Tramadol Hcl 50 Mg Tabs (Tramadol Hcl) .... Take Half A Pill Nightly 7)  Reglan 10 Mg Tabs (Metoclopramide Hcl) .... Take One By Mouth 10 Minutes Prior To Meals. 8)  Citalopram Hydrobromide 20 Mg Tabs (Citalopram Hydrobromide) .... Take One Daily For Depression 9)  Novolog 100 Unit/ml Soln (Insulin Aspart) .... Use As Directed 5 Units With A Small Meal, 10 Units With Large Meal 10)  Bd Insulin Syringe Microfine 27g X 5/8" 1 Ml Misc (Insulin Syringe-Needle U-100) .... Use As Directed 11)  Bd  Pen Needle Short U/f 31g X 8 Mm Misc (Insulin Pen Needle) .... Use As Directed 12)  Lyrica 75 Mg Caps (Pregabalin) .[name].. 1 Two Times A Day 13)  Fluconazole 50 Mg Tabs (Fluconazole) .... Take 3 Tabs Once  Allergies: 1)  Ibuprofen  Review of Systems  The patient denies fever, weight loss, and weight gain.    Physical Exam  General:  alert, well-developed, well-nourished, and well-hydrated.   Msk:  RIGHT Trapezius several trigger points.  Shoulder B symmetrical Scapula move normally. Right shoulder pain with supraspinatus testing but RC strength seems intact . Distally neurovascularly intact Additional Exam:  Patient given informed consent for injection. Discussed possible complications of infection, bleeding or skin atrophy at site of injection. Possible side effect of avascular necrosis (focal area of bone death) due to steroid use.Appropriate verbal time out taken Are cleaned and prepped in usual sterile fashion. A ---1- cc kennalog plus --4--cc 1% lidocaine without epinephrine was mixed. half of this was was injected into the--right subacromial bursa and half of it into the right GH joint.-. Patient tolerated procedure well with no complications. A 1 cc kennalog 40 plus 2 cc lidocaine 1% without epi was injected into 4 trigger points of the right trapezius.   Reviewed her cervical spine films which are negative for fracture, she has  open foramin on oblique views. Significant straightening of normal cervical curvature c/w spasm.   Impression & Recommendations:  Problem # 1:  SHOULDER PAIN, RIGHT (ICD-719.41)  injection tehrapy and will schedule for PT. 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 rtc after 4 w PT  Problem # 2:  NECK SPASM (ICD-847.0)  Complete Medication List: 1)  Adult Aspirin Low Strength 81 Mg Tbdp (Aspirin) .... One tab by mouth qday 2)  Lantus 100 Unit/ml Soln (Insulin glargine) .... Take 30 units  daily in am 3)  Miralax Powd (Polyethylene glycol 3350) .Marland KitchenMarland Kitchen. 17 gm in 8 oz fluid po daily until regular then as needed for constipation 4)  Ranitidine Hcl 150 Mg Caps (Ranitidine hcl) .... One by mouth two times a day 5)  Nitrostat 0.3 Mg Subl (Nitroglycerin) .... Take under tongue as needed for chest pain, may take up to 3 times every five minutes. 6)  Tramadol Hcl 50 Mg Tabs (Tramadol hcl) .... Take half a pill nightly 7)  Reglan 10 Mg Tabs (Metoclopramide hcl) .... Take one by mouth 10 minutes prior to meals. 8)  Citalopram Hydrobromide 20 Mg Tabs (Citalopram hydrobromide) .... Take one daily for depression 9)  Novolog 100 Unit/ml Soln (Insulin aspart) .... Use as directed 5 units with a small meal, 10 units with large meal 10)  Bd Insulin Syringe Microfine 27g X 5/8" 1 Ml Misc (Insulin syringe-needle u-100) .... Use as directed 11)  Bd Pen Needle Short U/f 31g X 8 Mm Misc (Insulin pen needle) .... Use as directed 12)  Lyrica 75 Mg Caps (Pregabalin) .[name].. 1 two times a day 13)  Fluconazole 50 Mg Tabs (Fluconazole) .... Take 3 tabs once  Other Orders: Joint Aspirate / Injection, Large (20610) Kenalog 10 mg inj [id number])   Orders Added: 1)  Est. Patient Level IV [id number] 2)  Joint Aspirate / Injection, Large [20610] 3)  Kenalog 10 mg inj [id number]

## 2010-07-16 NOTE — Letter (Signed)
Summary: Generic Letter  [facility]  [address]   [place], [place] [address]   Phone: [phone]  Fax: [phone]    08/29/2009  HENSLEY KASCHAK [address] [place], [place]  [address]  Dear Ms. [facility],   To whom it may concern,   Please allow Ms Rubenzer to wear closed toed clogs to work.     If you have any questions, feel free to contact my office.      Sincerely,   Ria Bush  MD

## 2010-07-16 NOTE — Assessment & Plan Note (Signed)
Summary: Chronic Disease Followup   Vital Signs:  Patient profile:   55 year old female Weight:      143.3 pounds Temp:     98.3 degrees F oral Pulse rate:   94 / minute BP sitting:   162 / 84  (left arm) Cuff size:   regular  Vitals Entered By: Audelia Hives CMA (February 19, 2010 10:12 AM) CC: Depression, Hypertension Management   Primary Care Provider:  Shanda Howells MD  CC:  Depression and Hypertension Management.  History of Present Illness: Hand Pain:  Pt reports return in R hand pain and in 2nd MCP and DIP, as well as 1st MCP.  Previously had joint injections at [facility] which improved sxs dramatically per pt. Feels like she can no longer perform funcitons at work because of pain .  DM: Blood sugars improved w/ meal coverage. Blood sugars now in 150s-10s. Intermittent 220s. No polyuria, polydypsia, vision changes per pt.   Depression History:      Positive alarm features for depression include insomnia and fatigue (loss of energy).  However, she denies significant weight loss, significant weight gain, feelings of worthlessness (guilt), impaired concentration (indecisiveness), and recurrent thoughts of death or suicide.  The patient denies symptoms of a manic disorder including persistently & abnormally elevated mood, abnormally & persistently irritable mood, less need for sleep, talkative or feels need to keep talking, distractibility, flight of ideas, increase in goal-directed activity, psychomotor agitation, inflated self-esteem or grandiosity, excessive buying sprees, excessive sexual indiscretions, and excessive foolish business investments.        The patient denies that she feels like life is not worth living and denies that she wishes that she were dead.        Comments:  Pt states that baseline mood is worsened since being off of maintenance medication-cannot afford meds. .  Hypertension History:      She complains of chest pain, but denies headache, dyspnea with exertion,  orthopnea, peripheral edema, and visual symptoms.        Positive major cardiovascular risk factors include diabetes, hyperlipidemia, hypertension, and current tobacco user.  Negative major cardiovascular risk factors include female age less than 86 years old.        Positive history for target organ damage include cardiac end organ damage (either CHF or LVH), peripheral vascular disease, and hypertensive retinopathy.  Further assessment for target organ damage reveals no history of stroke/TIA or renal insufficiency.      Habits & Providers  Alcohol-Tobacco-Diet     Tobacco Status: current     Tobacco Counseling: to quit use of tobacco products     Cigarette Packs/Day: 0.75     Pack years: 2008  Allergies: 1)  Ibuprofen  Physical Exam  General:  alert.   Head:  normocephalic and atraumatic.   Eyes:  vision grossly intact.   Mouth:  pharynx pink and moist.   Neck:  supple and full ROM. No JVD Chest Wall:  no tenderness.   Lungs:  CTAB, no wheezes, rales, rhoncii Heart:  normal rate, regular rhythm, and no murmur.   Abdomen:  soft, non-tender, and normal bowel sounds.   Msk:  R hand: tenderness to palpation over R wrist and 1st MCP, 3rd DIP,  Extremities:  2+ peripheral pulses, trace edema bilaterally  Neurologic:  alert & oriented X3.   Psych:  flat affect.      Impression & Recommendations:  Problem # 1:  BIPOLAR II DISORDER (ICD-296.89) Assessment Deteriorated Plan  to transition to tegretol for concurrent mood and fibromyalgia management as pt cannot afford other medication. Will transition back to previous regimen once patient can afford medications pending insurance set up. No HI/SI. Psych red flags reviewed. Will obtain CBC and BMET in 2 weeks for monitoring with tegretol use.   Problem # 2:  HYPERTENSION, BENIGN ESSENTIAL, LABILE (ICD-401.1) Will increase lisinopril-HCTZ to full dosing to decrease BPs. Will also check BMET to assess electrolyte and renal function.  Her  updated medication list for this problem includes:    Lisinopril-hydrochlorothiazide 20-12.5 Mg Tabs (Lisinopril-hydrochlorothiazide) .[name]... Take 2 pills once daily    Lopressor 50 Mg Tabs (Metoprolol tartrate) .[name]... Take 1/2 tablet twice daily  Orders: Comp Met-[facility] [id number]) [place]- Est Level  3 [id number])  Problem # 3:  DIABETES MELLITUS, TYPE II (ICD-250.00) Blood sugars improved with compliance on novolog meal coverage. Will SLIGHTLY increase sliding for improved coverage. Wil reassess A1C in Nov.  Her updated medication list for this problem includes:    Adult Aspirin Low Strength 81 Mg Tbdp (Aspirin) ..... One tab by mouth qday    Lantus 100 Unit/ml Soln (Insulin glargine) .[name]... Take 30 units daily in am    Lisinopril-hydrochlorothiazide 20-12.5 Mg Tabs (Lisinopril-hydrochlorothiazide) .[name]... Take 2 pills once daily    Novolog 100 Unit/ml Soln (Insulin aspart) ..... Use as directed 5 units with a small meal, 10 units with large meal  Orders: Comp Met-[facility] [id number]) [place]- Est Level  3 [id number])  Problem # 4:  PAIN IN JOINT, HAND [id number]) Assessment: Deteriorated Will refer to [facility] for management of R hand pain as pt reports symptomatic improvement w/ joint injections. Reviewed risks of injections w/ pt.  Pt expressed understanding. Pt agreeable to plan.  Orders: Sports Medicine (Sports Med) [facility]- Est Level  3 [phone])  Complete Medication List: 1)  Adult Aspirin Low Strength 81 Mg Tbdp (Aspirin) .... One tab by mouth qday 2)  Lantus 100 Unit/ml Soln (Insulin glargine) .... Take 30 units daily in am 3)  Miralax Powd (Polyethylene glycol 3350) .Marland KitchenMarland Kitchen. 17 gm in 8 oz fluid po daily until regular then as needed for constipation 4)  Ranitidine Hcl 150 Mg Caps (Ranitidine hcl) .... One by mouth two times a day 5)  Nitrostat 0.3 Mg Subl (Nitroglycerin) .... Take under tongue as needed for chest pain, may take up to 3 times every five minutes. 6)  Tramadol Hcl 50 Mg Tabs (Tramadol hcl) ....  Take half a pill nightly 7)  Reglan 10 Mg Tabs (Metoclopramide hcl) .... Take one by mouth 10 minutes prior to meals. 8)  Citalopram Hydrobromide 20 Mg Tabs (Citalopram hydrobromide) .... Take one daily for depression 9)  Lisinopril-hydrochlorothiazide 20-12.5 Mg Tabs (Lisinopril-hydrochlorothiazide) .... Take 2 pills once daily 10)  Novolog 100 Unit/ml Soln (Insulin aspart) .... Use as directed 5 units with a small meal, 10 units with large meal 11)  Bd Insulin Syringe Microfine 27g X 5/8" 1 Ml Misc (Insulin syringe-needle u-100) .... Use as directed 12)  Bd Pen Needle Short U/f 31g X 8 Mm Misc (Insulin pen needle) .... Use as directed 13)  Lyrica 75 Mg Caps (Pregabalin) .[name].. 1 two times a day 14)  Fluconazole 50 Mg Tabs (Fluconazole) .... Take 3 tabs once 15)  Lopressor 50 Mg Tabs (Metoprolol tartrate) .... Take 1/2 tablet twice daily 16)  Pravastatin Sodium 80 Mg Tabs (Pravastatin sodium) .... Take 1 tablet daily  Other Orders: Colonoscopy (Colon) UA Microalbumin-[facility] [id number]) Future Orders: Comp Met-[facility] [id number]) ... 03/15/2010 CBC-[facility] (  [id number]) ... 03/15/2010  Hypertension Assessment/Plan:      The patient's hypertensive risk group is category C: Target organ damage and/or diabetes.  Her calculated 10 year risk of coronary heart disease is > 32 %.  Today's blood pressure is 162/84.  Her blood pressure goal is < 130/80.  Patient Instructions: 1)  It was good seeing you again today 2)  Make an appointment with Sports Medicine 3)  We will start you on carbamezepine for you mood 4)  Increase your sliding scale coverage to 6 units for small meals and 11-12 units for large meals-HOLD novolog for blood sugars less than 90 5)  We will increase your lisinopril-HCTZ to 2 tabs daily  6)  We will refil your nitroglycerin for your chest pain 7)  Come back to see me after your [facility] account is set up  8)  Otherwise call for any questions 9)  God Bless,  10)  Shanda Howells  MD  Prescriptions: NITROSTAT 0.3 MG SUBL (NITROGLYCERIN) take under tongue as needed for chest pain, may take up to 3 times every five minutes.  #10 x 1   Entered and Authorized by:   Shanda Howells MD   Signed by:   Shanda Howells MD on 02/19/2010   Method used:   Electronically to        [facility] (retail)       [address]       [place], [place]  [address]       Ph: [phone]       Fax: [phone]   RxID:   [id number] LISINOPRIL-HYDROCHLOROTHIAZIDE 20-12.5 MG TABS (LISINOPRIL-HYDROCHLOROTHIAZIDE) take 2 pills once daily  #60 x 3   Entered and Authorized by:   Shanda Howells MD   Signed by:   Shanda Howells MD on 02/19/2010   Method used:   Electronically to        [facility] (retail)       [address]       [place], [place]  [address]       Ph: [phone]       Fax: [phone]   RxID:   [id number]    Prevention & Chronic Care Immunizations   Influenza vaccine: Fluvax Non-MCR  (03/22/2009)   Influenza vaccine due: 04/19/2009    Tetanus booster: 09/19/2003: Done.   Tetanus booster due: 09/18/2013    Pneumococcal vaccine: Not documented  Colorectal Screening   Hemoccult: Not documented    Colonoscopy: Not documented   Colonoscopy action/deferral: GI Referral  (02/19/2010)   Colonoscopy due: Refused  (08/01/2008)  Other Screening   Pap smear: Not documented   Pap smear due: Not Indicated    Mammogram: Not documented   Mammogram action/deferral: Ordered  (12/20/2009)   Smoking status: current  (02/19/2010)   Smoking cessation counseling: yes  (04/19/2008)   Target quit date: 06/23/2007  (05/25/2007)  Diabetes Mellitus   HgbA1C: 11.9  (01/17/2010)   Hemoglobin A1C due: 05/17/2008    Eye exam: diabetic retinopathy  (07/03/2009)   Diabetic eye exam action/deferral: Ophthalmology referral  (03/06/2009)   Eye exam due: 07/2010    Foot exam: yes  (01/17/2010)   Foot exam  action/deferral: Do today   High risk foot: Not documented   Foot care education: Not documented    Urine microalbumin/creatinine ratio: Not documented   Urine microalbumin action/deferral: Ordered  Diabetes flowsheet reviewed?: Yes   Progress toward A1C goal: Unchanged  Lipids   Total Cholesterol: 255  (12/27/2009)   Lipid panel action/deferral: Lipid Panel ordered   LDL: 179  (12/27/2009)   LDL Direct: Not documented   HDL: 53  (12/27/2009)   Triglycerides: 113  (12/27/2009)    SGOT (AST): 14  (12/27/2009)   SGPT (ALT): 11  (12/27/2009) CMP ordered    Alkaline phosphatase: 116  (12/27/2009)   Total bilirubin: 0.6  (12/27/2009)    Lipid flowsheet reviewed?: Yes   Progress toward LDL goal: Unchanged  Hypertension   Last Blood Pressure: 162 / 84  (02/19/2010)   Serum creatinine: 0.70  (01/02/2010)   BMP action: Ordered   Serum potassium 3.5  (01/02/2010) CMP ordered     Hypertension flowsheet reviewed?: Yes   Progress toward BP goal: Improved  Self-Management Support :   Personal Goals (by the next clinic visit) :     Personal A1C goal: 8  (07/18/2009)     Personal blood pressure goal: 140/90  (02/14/2009)     Personal LDL goal: 100  (02/14/2009)    Diabetes self-management support: Written self-care plan  (02/19/2010)   Diabetes care plan printed    Hypertension self-management support: Written self-care plan  (02/19/2010)   Hypertension self-care plan printed.    Lipid self-management support: Written self-care plan  (02/19/2010)   Lipid self-care plan printed.   Nursing Instructions: Screening colonoscopy ordered    Laboratory Results   Urine Tests  Date/Time Received: February 19, 2010 11:07 AM  Date/Time Reported: February 19, 2010 11:27 AM   Microalbumin (urine): 30 mg/L Creatinine: 100mg /dL  A:C Ratio <30 Normal Comments: ...........test performed by...........Marland KitchenHedy Camara, CMA

## 2010-07-16 NOTE — Assessment & Plan Note (Signed)
Summary: Chronic problem followup   Vital Signs:  Patient profile:   55 year old female Height:      62 inches Weight:      136.8 pounds BMI:     25.11 Pulse rate:   98 / minute BP sitting:   160 / 90  (left arm) Cuff size:   regular  Vitals Entered By: Mauricia Area CMA, (January 17, 2010 3:36 PM) CC: f/up HTN and DM. discuss meds. Lyrica is too expensive. unable to afford., C-V Risk Management Is Patient Diabetic? Yes Pain Assessment Patient in pain? yes     Location: body Intensity: 5 Onset of pain  Chronic   Primary Care Provider:  Shanda Howells MD  CC:  f/up HTN and DM. discuss meds. Lyrica is too expensive. unable to afford. and C-V Risk Management.  History of Present Illness: 55 YOF w/ PMHx/o poorly controlled HTN and DM here for chronic problem folowup visit:  HTN: Pt reports BPs in 150s-170s at home. Has been compliant w/ current regimen of lisinopril-HCTZ. Pt does report intermittent CP which has been longstanding for pt. CP non-exertional. No radiation to jaw/ L shoulder, no diaphoresis. Still eating high salt diet.    DM: Eating oatmeal, lantus usually in am 30 units. Has been eating several "snacks" which pt has not covered w/ novolog including peanut butter crackers, sandwiches, yogurt/cottage cheese. Pt does report intermittent blurry vision. Improved polyuria and polydypsia from previous visit.   Diabetic Neuropathy: Pt states that lyrica has become too expensive to take and is wondering if there is another alternative for her to manage her neuropathic pain. Pain has been relatively well controlled w/ lyrica thus far. Pt denies any worsening in sxs.   Cardiovascular Risk History:      Positive major cardiovascular risk factors include diabetes, hyperlipidemia, hypertension, and current tobacco user.  Negative major cardiovascular risk factors include female age less than 56 years old.        Positive history for target organ damage include cardiac end organ  damage (either CHF or LVH), peripheral vascular disease, and hypertensive retinopathy.  Further assessment for target organ damage reveals no history of stroke/TIA or renal insufficiency.      Habits & Providers  Alcohol-Tobacco-Diet     Tobacco Status: current     Tobacco Counseling: to quit use of tobacco products     Cigarette Packs/Day: 0.75  Comments: has upcoming appt with pharmacist.  Allergies: 1)  Ibuprofen  Physical Exam  General:  normal appearance.   Head:  normocephalic and atraumatic.   Eyes:  vision grossly intact, pupils equal, pupils round, and pupils reactive to light.  No visible funduscopic abnormalities on exam Ears:  R ear normal and L ear normal.   Nose:  no external deformity.   Mouth:  fair dentition and teeth missing.   Neck:  supple and full ROM, no JVD  Lungs:  CTAB, no wheezes Heart:  RRR, no murmurs auscultated Abdomen:  S/NT/ND Extremities:  2+ peripheral pulses, minimal edema Psych:  good eye contact.    Diabetes Management Exam:    Foot Exam (with socks and/or shoes not present):       Sensory-Pinprick/Light touch:          Left medial foot (L-4): normal          Left dorsal foot (L-5): normal          Left lateral foot (S-1): normal  Right medial foot (L-4): diminished          Right dorsal foot (L-5): normal          Right lateral foot (S-1): normal       Sensory-Monofilament:          Left foot: normal          Right foot: diminished       Inspection:          Left foot: normal          Right foot: normal       Nails:          Left foot: normal          Right foot: thickened   Impression & Recommendations:  Problem # 1:  DIABETES MELLITUS, TYPE II (ICD-250.00) HgbA1C 11.9 today. Overall very poorly controlled w/ major contribution of poor pt education/understanding of medication regimen. Will likely need reevalaution w/ pharmacy clinic as well as nutrition for educational reinforcement. Pt instructed extensively to  cover ANY meal eaten w/ sliding scale novolog (5 units small meals, 10 units large meals). Overall symptomatology improved from previous visit. However, will need followup w/ ophtalmology and podiatry for diabetic/hypertensive sequelae. Pt instructed to followup in 1 week to assess any improvement in CBGs w/ compliance to novolog regimen. Will consider addition of oral agent at next visit for better control. Pt agreeable. Her updated medication list for this problem includes:    Adult Aspirin Low Strength 81 Mg Tbdp (Aspirin) ..... One tab by mouth qday    Lantus 100 Unit/ml Soln (Insulin glargine) .[name]... Take 30 units daily in am    Lisinopril-hydrochlorothiazide 20-12.5 Mg Tabs (Lisinopril-hydrochlorothiazide) .[name]... Take 1 tab by mouth daily    Novolog 100 Unit/ml Soln (Insulin aspart) ..... Use as directed 5 units with a small meal, 10 units with large meal  Orders: [facility] [id number]) [facility]- Est Level  3 [id number])  Problem # 2:  HYPERTENSION, BENIGN ESSENTIAL, LABILE (ICD-401.1) Poorly controlled. Will start pt on norvasc for better BP control. Pt instructed to take two times a day-TID BP checks. Will followup in 1 week. Will consider additon of metoprolol if SBP >145 at next visit.  Her updated medication list for this problem includes:    Lisinopril-hydrochlorothiazide 20-12.5 Mg Tabs (Lisinopril-hydrochlorothiazide) .[name]... Take 1 tab by mouth daily    Norvasc 10 Mg Tabs (Amlodipine besylate) .[name]... Take 1 tablet daily  Orders: [place]- Est Level  3 [id number])  Problem # 3:  DIABETIC PERIPHERAL NEUROPATHY (ICD-250.60) Will start pt on neurontin for perpiheral neuropathy as pt can no longer afford lyrica. Will also give podiatry referral. Pt agreeable.  Her updated medication list for this problem includes:    Adult Aspirin Low Strength 81 Mg Tbdp (Aspirin) ..... One tab by mouth qday    Lantus 100 Unit/ml Soln (Insulin glargine) .[name]... Take 30 units daily in am    Lisinopril-hydrochlorothiazide 20-12.5  Mg Tabs (Lisinopril-hydrochlorothiazide) .[name]... Take 1 tab by mouth daily    Novolog 100 Unit/ml Soln (Insulin aspart) ..... Use as directed 5 units with a small meal, 10 units with large meal  Orders: Podiatry Referral (Podiatry)  Problem # 4:  CHEST PAIN (ICD-786.50) Currently no concern for worsening CP. Pt not using as needed nitro for pain. However, pt w/ extensive CV risk factors. Pt initially referred to caridology 07/2008. However no record noted in echart. Will readdress at next visit. Red flags reviewed extensively w/ pt.   Complete Medication List:  1)  Adult Aspirin Low Strength 81 Mg Tbdp (Aspirin) .... One tab by mouth qday 2)  Lantus 100 Unit/ml Soln (Insulin glargine) .... Take 30 units daily in am 3)  Miralax Powd (Polyethylene glycol 3350) .Marland KitchenMarland Kitchen. 17 gm in 8 oz fluid po daily until regular then as needed for constipation 4)  Ranitidine Hcl 150 Mg Caps (Ranitidine hcl) .... One by mouth two times a day 5)  Nitrostat 0.3 Mg Subl (Nitroglycerin) .... Take under tongue as needed for chest pain, may take up to 3 times every five minutes. 6)  Tramadol Hcl 50 Mg Tabs (Tramadol hcl) .... Take half a pill nightly 7)  Reglan 10 Mg Tabs (Metoclopramide hcl) .... Take one by mouth 10 minutes prior to meals. 8)  Citalopram Hydrobromide 20 Mg Tabs (Citalopram hydrobromide) .... Take one daily for depression 9)  Lisinopril-hydrochlorothiazide 20-12.5 Mg Tabs (Lisinopril-hydrochlorothiazide) .... Take 1 tab by mouth daily 10)  Novolog 100 Unit/ml Soln (Insulin aspart) .... Use as directed 5 units with a small meal, 10 units with large meal 11)  Bd Insulin Syringe Microfine 27g X 5/8" 1 Ml Misc (Insulin syringe-needle u-100) .... Use as directed 12)  Bd Pen Needle Short U/f 31g X 8 Mm Misc (Insulin pen needle) .... Use as directed 13)  Lyrica 75 Mg Caps (Pregabalin) .[name].. 1 two times a day 14)  Fluconazole 50 Mg Tabs (Fluconazole) .... Take 3 tabs once 15)  Lipitor 40 Mg Tabs (Atorvastatin calcium)  .... Take 1 tablet daily 16)  Norvasc 10 Mg Tabs (Amlodipine besylate) .... Take 1 tablet daily 17)  Neurontin 300 Mg Caps (Gabapentin) .... Take 1 tablet at night on day 1, 1 tablet twice on day 2, 1 tablet 3 times daily thereafter for neuropathic pain  Other Orders: Ophthalmology Referral (Ophthalmology)  Cardiovascular Risk Assessment/Plan:      The patient's hypertensive risk group is category C: Target organ damage and/or diabetes.  Her calculated 10 year risk of coronary heart disease is > 32 %.  Today's blood pressure is 160/90.  Her blood pressure goal is < 130/80.  Patient Instructions: 1)  It was good to see you today 2)  Take 5 units of novolog with ANY meal that you eat 3)  I will add norvasc to your BP regimen 4)  I will also refer you the eye doctor 5)  Otherwise, come back in 1 week 6)  If you have any concerns, give [place] a call 7)  God Bless 8)  Shanda Howells MD Prescriptions: NEURONTIN 300 MG CAPS (GABAPENTIN) take 1 tablet at night on day 1, 1 tablet twice on day 2, 1 tablet 3 times daily thereafter for neuropathic pain  #90 x 3   Entered and Authorized by:   Shanda Howells MD   Signed by:   Shanda Howells MD on 01/17/2010   Method used:   Electronically to        [facility] [address] (retail)       [place], [place]  [address]       Ph: [phone]       Fax: [phone]   RxID:   [id number] NORVASC 10 MG TABS (AMLODIPINE BESYLATE) take 1 tablet daily  #30 x 3   Entered and Authorized by:   Shanda Howells MD   Signed by:   Shanda Howells MD on 01/17/2010   Method used:   [facility]       [address]       [place], [place]  [address]       Ph: [phone]       Fax: [phone]   RxID:   [id number]   Laboratory Results   Blood Tests   Date/Time Received: January 17, 2010 3:44 PM  Date/Time Reported: January 17, 2010 3:57 PM   HGBA1C:  11.9%   (Normal Range: Non-Diabetic - 3-6%   Control Diabetic - 6-8%)  Comments: ...............test performed by......Marland KitchenBonnie A. Martinique, MLS (ASCP)cm

## 2010-07-16 NOTE — Miscellaneous (Signed)
Summary: PATIENT SUMMARY   Clinical Lists Changes  Problems: Removed problem of NEED PROPHYLACTIC VACCINATION&INOCULATION FLU (ICD-V04.81) Removed problem of LUMBAR SPRAIN AND STRAIN (ICD-847.2)  55 yo with h/o poorly controlled HTN, DM, HLD 2/2 noncompliance with prescribed meds, diffuse arthropathy of unclear etiology.  I would like to send patient to rheumatologist but have been unable to 2/2 financial concerns.  Has seen [facility] multiple times for trigger finger injections and dequervain's steroid injection.  Also with psychiatric issues - bipolar dx in chart and patient was on lyrica, but MAP unable to provide anymore.  Receiving samples per PK.    DM - previously on Lantus and had seen steady improvement in A1c's after attending diabetes education class, pt had started showing insight into illness.  However, recently control has deteroirated again.  New regimen is 5 u Lantus in QHS with novolog 5 -10 u with meals.  Gastroparesis - reglan helping.  Neuropathy - lyrica.  continuing samples.  HLD - on pravastatin, due for FLP.  Arthropathies - h/o negative ANA, negative RPR, elevated RF at 103.  Have not checked anti-CCP.  seen at [facility], thought more consistent with fibromyalgia.  Have given handout on fibromyalgia but have not had chance to discuss dx.  tobacco abuse.

## 2010-07-16 NOTE — Miscellaneous (Signed)
Summary: ROI/TS  ROI FAXED TO THE [facility] [place] CMA,  August 06, 2009 1:40 PM  Clinical Lists Changes

## 2010-07-16 NOTE — Assessment & Plan Note (Signed)
Summary: f/u eo   Vital Signs:  Patient profile:   55 year old female Height:      62 inches Weight:      140 pounds BMI:     25.70 Temp:     98.3 degrees F oral Pulse rate:   104 / minute BP sitting:   170 / 90  (right arm) Cuff size:   regular  Vitals Entered By: Elray Mcgregor RN  Serial Vital Signs/Assessments:  Time      Position  BP       Pulse  Resp  Temp     By                     165/88                         Ria Bush  MD  CC: f/u Is Patient Diabetic? Yes Pain Assessment Patient in pain? yes     Location: bilateral big toes   Primary Care Provider:  Ria Bush  MD  CC:  f/u.  History of Present Illness: CC: f/u HTN, DM, foot pain  1. HTN - did not take combo pill because instructions say "may not be as effective in black patients".  refused to take.  only has been taking enalapril 10mg  daily.  2. DM - ran out of lantus yesterday.  was concerned because thought [facility] was no longer providing lantus.  PK says lantus should still be provided.  To send scripts to [facility].  3. big toe pain - noticed recently nails have been growing thicker.  also endorses big toe sharp pains that come on especially when wearing closed toed shoes.  Asks for letter for work allowing her to use wider shoes.  provided with this.  has not seen podiatrist in past.  recently had steroid injection in right hand by Dr. Nori Riis.  Has one bottle left of lyrica.    Habits & Providers  Alcohol-Tobacco-Diet     Tobacco Status: current     Cigarette Packs/Day: 1.0  Allergies (verified): 1)  Ibuprofen  Past History:  Past medical, surgical, family and social histories (including risk factors) reviewed for relevance to current acute and chronic problems.  Past Medical History: Reviewed history from 08/23/2009 and no changes required. Has received Hep B series immunizations, h/o non-compliance Diabetes mellitus, type II Hypercholesterolemia HTN h/o Trigger fingers (R index  inj 12/08, Bilateral thumb inj 11/09, R thumb and dequervain's injections 01/2009) ? glaucoma (h/o peripheral iridectomy), ? small cataracts Per [facility] records: Bipolar II, MJ abuse.  Previously on Zoloft and Effexor (both made her sleepy) and seroquel/lamictal (stopped 2/2 cost) Psych admission 1992 2/2 SA  Past Surgical History: Reviewed history from 03/01/2009 and no changes required. Perirpheral iridectomy - 02/12/2005 Cardiac Cath-normal LV fxn EF>60% - 05/06/2004 s/p abdominal hysterectomy 1986 (fibroids)  Family History: Reviewed history from 08/23/2009 and no changes required. Father died at 56 of MI, Mother died at 61 with diabetes and heart dz, Sister awaiting CABG, Strong family h/o CAD and MI Father and sister with bipolar. Salley Scarlet' smoker as of 12/08  Social History: Reviewed history from 03/01/2009 and no changes required. Lives with five grandkids .  She works full-time at [employer]. ; [place] daily.  denies other rec drugs Smokes 1/2 ppd; Finances are a major issue for her.  Meeting with [facility].  Rejected by Healthcare sharing initiative. Currently receiving med  assistance for  lantus through our office and lyrica; 30 pack year h/o smoking. Has questionable h/o etoh abuse.  Has not had any etoh since her mother died in 17-Sep-2002; married.Packs/Day:  1.0  Physical Exam  General:  alert and well-developed.   Lungs:  Normal respiratory effort, chest expands symmetrically. Lungs are clear to auscultation, no crackles or wheezes. Heart:  Normal rate and regular rhythm. S1 and S2 normal without gallop, murmur, click, rub or other extra sounds.  Msk:  see foot exam.  Diabetes Management Exam:    Foot Exam (with socks and/or shoes not present):       Sensory-Pinprick/Light touch:          Left medial foot (L-4): normal          Left dorsal foot (L-5): normal          Left lateral foot (S-1): normal          Right medial foot (L-4): normal           Right dorsal foot (L-5): normal          Right lateral foot (S-1): normal       Sensory-Monofilament:          Left foot: diminished          Right foot: diminished       Inspection:          Left foot: abnormal          Right foot: abnormal             Comments: pinky with corn lateral jointline, + skin thickening medial fifth toe.       Nails:          Left foot: thickened          Right foot: thickened    Eye Exam:       Eye Exam done elsewhere   Impression & Recommendations:  Problem # 1:  DIABETES MELLITUS, TYPE II (ICD-250.00) referral to foot doctor for evaluation of toes.  have refilled lantus to [facility].  advised to refill as was having very good progress towards goal when on lantus.  Her updated medication list for this problem includes:    Adult Aspirin Low Strength 81 Mg Tbdp (Aspirin) ..... One tab by mouth qday    Enalapril Maleate 10 Mg Tabs (Enalapril maleate) .[name]... Take one by mouth daily    Lantus 100 Unit/ml Soln (Insulin glargine) .Marland KitchenMarland KitchenMarland KitchenMarland Kitchen 35 units q pm, qs 1 month  Orders: Podiatry Referral (Podiatry) [facility]- Est  Level 4 [phone])  Labs Reviewed: Creat: 0.64 (08/01/2008)   Microalbumin: 1+ (08/01/2008)  Last Eye Exam: diabetic retinopathy (07/03/2009) Reviewed HgBA1c results: 8.3 (07/18/2009)  9.1 (04/27/2009)  Problem # 2:  DYSTROPHIC NAILS (ICD-703.8) podiatry referral ,also for corn/callus on right pinky toe. Orders: Podiatry Referral (Podiatry) [facility]- Est  Level 4 [id number])  Problem # 3:  BIPOLAR II DISORDER (ICD-296.89)  celexa and lyrica.  If has to go off lyrica, consider lamictal (not sedating and previously on) ([facility] may have affordable option) or depakote for mood stabilization.  Discussed with Dr. Gwenlyn Saran.    Also consider referral to mood disorder clinic.  Orders: [place]- Est  Level 4 [id number])  Problem # 4:  HYPERTENSION, BENIGN ESSENTIAL, LABILE (ICD-401.1)  deteriorated.  Pt refused to take ACEI/HCTZ combo pill.  will start  maxzide. Her updated medication list for this problem includes:    Triamterene-hctz 37.5-25 Mg Tabs (Triamterene-hctz) .[name]... Take one  daily for blood pressure    Enalapril Maleate 10 Mg Tabs (Enalapril maleate) .[name]... 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  consider gabapentin. Her updated medication list for this problem includes:    Adult Aspirin Low Strength 81 Mg Tbdp (Aspirin) ..... One tab by mouth qday    Enalapril Maleate 10 Mg Tabs (Enalapril maleate) .[name]... Take one by mouth daily    Lantus 100 Unit/ml Soln (Insulin glargine) .Marland KitchenMarland KitchenMarland KitchenMarland Kitchen 35 units q pm, qs 1 month  Orders: Podiatry Referral (Podiatry)  Labs Reviewed: Creat: 0.64 (08/01/2008)   Microalbumin: 1+ (08/01/2008)  Last Eye Exam: diabetic retinopathy (07/03/2009) Reviewed HgBA1c results: 8.3 (07/18/2009)  9.1 (04/27/2009)  Complete Medication List: 1)  Adult Aspirin Low Strength 81 Mg Tbdp (Aspirin) .... One tab by mouth qday 2)  Triamterene-hctz 37.5-25 Mg Tabs (Triamterene-hctz) .... Take one daily for blood pressure 3)  Enalapril Maleate 10 Mg Tabs (Enalapril maleate) .... Take one by mouth daily 4)  Lantus 100 Unit/ml Soln (Insulin glargine) .... 35 units q pm, qs 1 month 5)  Pravastatin Sodium 40 Mg Tabs (Pravastatin sodium) .... Take one by mouth daily 6)  Lyrica 75 Mg Caps (Pregabalin) .... Two times a day 7)  Miralax Powd (Polyethylene glycol 3350) .Marland KitchenMarland Kitchen. 17 gm in 8 oz fluid po daily until regular then as needed for constipation 8)  Ranitidine Hcl 150 Mg Caps (Ranitidine hcl) .... One by mouth two times a day 9)  Nitrostat 0.3 Mg Subl (Nitroglycerin) .... Take under tongue as needed for  chest pain, may take up to 3 times every five minutes. 10)  Tramadol Hcl 50 Mg Tabs (Tramadol hcl) .... Take half a pill nightly 11)  Reglan 10 Mg Tabs (Metoclopramide hcl) .... Take one by mouth 10 minutes prior to meals. 12)  Methocarbamol 500 Mg Tabs (Methocarbamol) .... Take three times daily as needed for muscle spasm 13)  Pantoprazole Sodium 40 Mg Tbec (Pantoprazole sodium) .... One daily 14)  Citalopram Hydrobromide 20 Mg Tabs (Citalopram hydrobromide) .... Take one daily for depression  Patient Instructions: 1)  Please return in 1 month for follow up HTN, DM. 2)  We have referred you to a foot doctor to take a look at your big toes. 3)  I have refilled your Lantus. 4)  Start new medicine for blood pressure: TRIAMTERENE/HCTZ (MAXZIDE). 5)  Continue Enalapril 10mg  for blood pressure. 6)  I will chat with Dr. Gwenlyn Saran our psychologist about changing your lyrica when you run out. 7)  Remember to start Celexa (takes 2-4 weeks to take effect). 8)  Call clinic with questions.  Pleasure to see you today. Prescriptions: LANTUS 100 UNIT/ML SOLN (INSULIN GLARGINE) 35 units q pm, QS 1 month  #1 x 3   Entered and Authorized by:   Ria Bush  MD   Signed by:   Ria Bush  MD on 08/29/2009   Method used:   Faxed to ...       [facility] (retail)       [address] [place]       [place], [place]  [address]  Ph: [phone]       Fax: [phone]   [id number] CITALOPRAM HYDROBROMIDE 20 MG TABS (CITALOPRAM HYDROBROMIDE) take one daily for depression  #30 x 1   Entered and Authorized by:   Ria Bush  MD   Signed by:   Ria Bush  MD on 08/29/2009   Method used:   Electronically to        [facility] (retail)       [address]       [place], [place]  [address]       Ph: [phone]       Fax: [phone]   RxID:   [id number] ENALAPRIL MALEATE 10 MG TABS (ENALAPRIL MALEATE) take one  by mouth daily  #30 x 3   Entered and Authorized by:   Ria Bush  MD   Signed by:   Ria Bush  MD on 08/29/2009   Method used:   Electronically to        [facility] (retail)       [address]       [place], [place]  [address]       Ph: [phone]       Fax: [phone]   RxID:   [id number] TRIAMTERENE-HCTZ 37.5-25 MG TABS (TRIAMTERENE-HCTZ) take one daily for blood pressure  #30 x 3   Entered and Authorized by:   Ria Bush  MD   Signed by:   Ria Bush  MD on 08/29/2009   Method used:   Electronically to        [facility] (retail)       [address]       [place], [place]  [address]       Ph: [phone]       Fax: [phone]   RxID:   [id number] LANTUS 100 UNIT/ML SOLN (INSULIN GLARGINE) 35 units q pm, QS 1 month  #1 x 3   Entered and Authorized by:   Ria Bush  MD   Signed by:   Ria Bush  MD on 08/29/2009   Method used:   Print then Give to Patient   RxID:   [id number] CITALOPRAM HYDROBROMIDE 20 MG TABS (CITALOPRAM HYDROBROMIDE) take one daily for depression  #30 x 1   Entered and Authorized by:   Ria Bush  MD   Signed by:   Ria Bush  MD on 08/29/2009   Method used:   Print then Give to Patient   RxID:   [id number] TRIAMTERENE-HCTZ 37.5-25 MG TABS (TRIAMTERENE-HCTZ) take one daily for blood pressure  #30 x 3   Entered and Authorized by:   Ria Bush  MD   Signed by:   Ria Bush  MD on 08/29/2009   Method used:   Print then Give to Patient   RxID:   [id number] ENALAPRIL MALEATE 10 MG TABS (ENALAPRIL MALEATE) take one by mouth daily  #30 x 3   Entered and Authorized by:   Ria Bush  MD   Signed by:   Ria Bush  MD on 08/29/2009   Method used:   Print then Give to Patient   RxID:   [id number]    Prevention & Chronic Care Immunizations   Influenza vaccine: Fluvax Non-MCR  (03/22/2009)    Influenza vaccine due: 04/19/2009  Tetanus booster: 09/19/2003: Done.   Tetanus booster due: 09/18/2013    Pneumococcal vaccine: Not documented  Colorectal Screening   Hemoccult: Not documented    Colonoscopy: Not documented   Colonoscopy due: Refused  (08/01/2008)  Other Screening   Pap smear: Not documented   Pap smear due: Not Indicated    Mammogram: Not documented   Smoking status: current  (08/29/2009)   Smoking cessation counseling: yes  (04/19/2008)   Target quit date: 06/23/2007  (05/25/2007)  Diabetes Mellitus   HgbA1C: 8.3  (07/18/2009)   Hemoglobin A1C due: 05/17/2008    Eye exam: diabetic retinopathy  (07/03/2009)   Diabetic eye exam action/deferral: Ophthalmology referral  (03/06/2009)   Eye exam due: 07/2010    Foot exam: yes  (08/29/2009)   Foot exam action/deferral: Do today   High risk foot: Not documented   Foot care education: Not documented    Urine microalbumin/creatinine ratio: Not documented    Diabetes flowsheet reviewed?: Yes   Progress toward A1C goal: Deteriorated  Lipids   Total Cholesterol: 273  (08/01/2008)   LDL: 173  (08/01/2008)   LDL Direct: Not documented   HDL: 58  (08/01/2008)   Triglycerides: 208  (08/01/2008)    SGOT (AST): 16  (08/01/2008)   SGPT (ALT): 23  (08/01/2008)   Alkaline phosphatase: 147  (08/01/2008)   Total bilirubin: 0.5  (08/01/2008)    Lipid flowsheet reviewed?: Yes   Progress toward LDL goal: Deteriorated  Hypertension   Last Blood Pressure: 170 / 90  (08/29/2009)   Serum creatinine: 0.64  (08/01/2008)   Serum potassium 4.0  (08/01/2008)    Hypertension flowsheet reviewed?: Yes   Progress toward BP goal: Improved  Self-Management Support :   Personal Goals (by the next clinic visit) :     Personal A1C goal: 8  (07/18/2009)     Personal blood pressure goal: 140/90  (02/14/2009)     Personal LDL goal: 100  (02/14/2009)    Diabetes self-management support: CBG self-monitoring log, Written  self-care plan, Education handout  (08/29/2009)   Diabetes care plan printed   Diabetes education handout printed    Hypertension self-management support: Written self-care plan, Education handout, Pre-printed educational material  (08/29/2009)   Hypertension self-care plan printed.   Hypertension education handout printed    Lipid self-management support: Written self-care plan, Education handout  (08/29/2009)   Lipid self-care plan printed.   Lipid education handout printed

## 2010-07-16 NOTE — Miscellaneous (Signed)
Summary: [facility] update   Clinical Lists Changes  Problems: Added new problem of BIPOLAR II DISORDER (ICD-296.89) Removed problem of BIPOLAR I, MIXED, MOST RECENT EPSD NOS (ICD-296.60) Observations: Added new observation of FAMILY HX: Father died at 69 of MI, Mother died at 6 with diabetes and heart dz, Sister awaiting CABG, Strong family h/o CAD and MI Father and sister with bipolar. [name]' smoker as of 12/08 (08/23/2009 15:42) Added new observation of PAST MED HX: Has received Hep B series immunizations, h/o non-compliance Diabetes mellitus, type II Hypercholesterolemia HTN h/o Trigger fingers (R index inj 12/08, Bilateral thumb inj 11/09, R thumb and dequervain's injections 01/2009) ? glaucoma (h/o peripheral iridectomy), ? small cataracts Per [facility] records: Bipolar II, MJ abuse.  Previously on Zoloft and Effexor (both made her sleepy) and seroquel/lamictal (stopped 2/2 cost) (08/23/2009 15:42)       Family History: Father died at 47 of MI, Mother died at 38 with diabetes and heart dz, Sister awaiting CABG, Strong family h/o CAD and MI Father and sister with bipolar. [name]' smoker as of 12/08   Past History:  Past Medical History: Has received Hep B series immunizations, h/o non-compliance Diabetes mellitus, type II Hypercholesterolemia HTN h/o Trigger fingers (R index inj 12/08, Bilateral thumb inj 11/09, R thumb and dequervain's injections 01/2009) ? glaucoma (h/o peripheral iridectomy), ? small cataracts Per [facility] records: Bipolar II, MJ abuse.  Previously on Zoloft and Effexor (both made her sleepy) and seroquel/lamictal (stopped 2/2 cost)  Appended Document: [facility] update     Clinical Lists Changes  Observations: Added new observation of PAST MED HX: Has received Hep B series immunizations, h/o non-compliance Diabetes mellitus, type II Hypercholesterolemia HTN h/o Trigger fingers (R index inj 12/08, Bilateral  thumb inj 11/09, R thumb and dequervain's injections 01/2009) ? glaucoma (h/o peripheral iridectomy), ? small cataracts Per [facility] records: Bipolar II, MJ abuse.  Previously on Zoloft and Effexor (both made her sleepy) and seroquel/lamictal (stopped 2/2 cost) Psych admission 1992 2/2 SA (08/23/2009 17:05)        Past History:  Past Medical History: Has received Hep B series immunizations, h/o non-compliance Diabetes mellitus, type II Hypercholesterolemia HTN h/o Trigger fingers (R index inj 12/08, Bilateral thumb inj 11/09, R thumb and dequervain's injections 01/2009) ? glaucoma (h/o peripheral iridectomy), ? small cataracts Per [facility] records: Bipolar II, MJ abuse.  Previously on Zoloft and Effexor (both made her sleepy) and seroquel/lamictal (stopped 2/2 cost) Psych admission 1992 2/2 SA

## 2010-07-16 NOTE — Assessment & Plan Note (Signed)
Summary: Insulin Diabetes - Rx Clinic   Vital Signs:  Patient profile:   55 year old female Height:      62 inches Weight:      140 pounds BMI:     25.70 Pulse rate:   98 / minute BP sitting:   169 / 91  (left arm)  Primary Care Provider:  Ria Bush  MD   History of Present Illness: a)  Diabetes:  Pt has had diabetes since her 73s. It is poorly controlled due to her diet and work schedule.   Her last A1C 10.4%.  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  She generally checks her BGs in the AM before eating and sometimes at night when she gets home.  She reports have BGs as low as 42 and high as 398.  When experiencing hypoglycemia, she reports feeling weak and sweating.  She either eats a candy bar or drinks Mt. Dew to correct her hypoglycemia.  When experiencing hyperglycemia, she reports feeling sleepy and having blurred vision, polydipsia, and polyuria.   She does state adherence with her Lantus, which she takes at  2:30 pm everyday.  She uses 25-35 units everyday.  Pt has previously tried metformin, but it caused too much GI upset. b) HTN: Pt was recently started on Triamterene-HCTZ, in addition to continuing to take enalapril.  However, patient has not taken her Triamterene-HCTZ, stating the package insert said it was not a "good drug for African Americans."  She does report compliance with enalapril taking it before bedtime.  She checks her BP every other day.  Her BP runs in the 150-170s/90s.  She also takes ASA and pravastatin everyday. c)  Diabetic neuropathy:  Pt reports using Tramadol as needed for leg pain and questionable fibromyalgia.  She previously tried Lyrica, but it became too expensive.  She has not tried gabapentin. d)  Depression:  Pt has recently switched to citalopram.  However, it has caused a decrease in her libido.  She wishes to change back to Effexor.Deferred to next Physcian visit.  e)   Lifestyle:  Again, patient does not eat on a regular basis and when she does eat, the food choices are not always the best option.  Pt does not exercise.  She states working as a Quarry manager is her exercise since she is "constantly running around."  She and her husband smoke.  Pt has smoked since age 51.  Both are interested in quitting.      Current Medications (verified): 1)  Adult Aspirin Low Strength 81 Mg  Tbdp (Aspirin) .... One Tab By Mouth Qday 2)  Lantus 100 Unit/ml Soln (Insulin Glargine) .... Take 5 Units At Bedtime 3)  Pravastatin Sodium 40 Mg Tabs (Pravastatin Sodium) .... Take One By Mouth Daily 4)  Miralax  Powd (Polyethylene Glycol 3350) .Marland KitchenMarland KitchenMarland Kitchen 17 Gm in 8 Oz Fluid Po Daily Until Regular Then As Needed For Constipation 5)  Ranitidine Hcl 150 Mg Caps (Ranitidine Hcl) .... One By Mouth Two Times A Day 6)  Nitrostat 0.3 Mg Subl (Nitroglycerin) .... Take Under Tongue As Needed For Chest Pain, May Take Up To 3 Times Every Five Minutes. 7)  Tramadol Hcl 50 Mg Tabs (Tramadol Hcl) .... Take Half A Pill Nightly 8)  Reglan 10 Mg Tabs (Metoclopramide Hcl) .... Take One By Mouth 10 Minutes Prior To Meals. 9)  Citalopram Hydrobromide 20 Mg Tabs (  Citalopram Hydrobromide) .... Take One Daily For Depression 10)  Lisinopril-Hydrochlorothiazide 20-12.5 Mg Tabs (Lisinopril-Hydrochlorothiazide) .... Take 1 Tab By Mouth Daily 11)  Novolog 100 Unit/ml Soln (Insulin Aspart) .... Use As Directed 5 Units With A Small Meal, 10 Units With Large Meal 12)  Bd Insulin Syringe Microfine 27g X 5/8" 1 Ml Misc (Insulin Syringe-Needle U-100) .... Use As Directed 13)  Bd Pen Needle Short U/f 31g X 8 Mm Misc (Insulin Pen Needle) .... Use As Directed  Allergies (verified): 1)  Ibuprofen   Impression & Recommendations:  Problem # 1:  DIABETES MELLITUS, TYPE II (ICD-250.00)  Diabetes of:   ~20 yrs duration currently under poor control of blood glucose control based on A1C of: 10.4, fasting CBGs of:42-227   and random  during the day readings of: 118-398.  Control is suboptimal due to: poor diet and work schedule.  Pt does report having both hypo- and hyperglycemic events.  She eats a candy bar or drinks a soda to manage her hypoglycemia.  Reports adherence with: Lantus.  Counseled pt on choosing more appropriate snacks like PB crackers and Crystal Light or Mio drinks instead of candy bars and soda, and eating more vegetables and fish, while minimizing carbohydrates.  Initiated Novolog 5-10 units with meals (5 units for smaller and 10 units for larger meals; no meal = no insulin)/ Adjusted Lantus dose down to 5 units daily.  Written pt instructions provided:  F/U Rx Clinic Visit: 1 month.  TTFFC: 60 mins.  Pt seen with:  Dr. Tamala Julian, DO and Jamas Lav, PharmD resident  The following medications were removed from the medication list:    Enalapril Maleate 10 Mg Tabs (Enalapril maleate) .[name]... Take one by mouth daily Her updated medication list for this problem includes:    Adult Aspirin Low Strength 81 Mg Tbdp (Aspirin) ..... One tab by mouth qday    Lantus 100 Unit/ml Soln (Insulin glargine) .[name]... Take 5 units at bedtime    Lisinopril-hydrochlorothiazide 20-12.5 Mg Tabs (Lisinopril-hydrochlorothiazide) .[name]... Take 1 tab by mouth daily    Novolog 100 Unit/ml Soln (Insulin aspart) ..... Use as directed 5 units with a small meal, 10 units with large meal  Orders: Inital Assessment Each 89min - [employer] [id number])  Problem # 2:  HYPERTENSION, BENIGN ESSENTIAL, LABILE (ICD-401.1)  Patient's BP today was 169/91 (goal <130/80).  Pt states compliance with enalapril, but reports she is not taking Triamterene-HCTZ due to its "lack of effect in African Americans." Her BP at home runs in the 150-170s/90s.  Initiated lisinopril/HCTZ and counseled pt to take medication in the AM to minimize nocturia.  Written pt instructions provided. The following medications were removed from the medication list:    Triamterene-hctz 37.5-25 Mg Tabs  (Triamterene-hctz) .[name]... Take one daily for blood pressure    Enalapril Maleate 10 Mg Tabs (Enalapril maleate) .[name]... Take one by mouth daily Her updated medication list for this problem includes:    Lisinopril-hydrochlorothiazide 20-12.5 Mg Tabs (Lisinopril-hydrochlorothiazide) .[name]... Take 1 tab by mouth daily  Orders: Inital Assessment Each 71min - [place] [id number])  Problem # 3:  DIABETIC PERIPHERAL NEUROPATHY (ICD-250.60) Pt has previously tried Lyrica with adequate pain relief.  Will provide samples for now.  Once DM controlled, pain may be relieved without the use of medications. The following medications were removed from the medication list:    Enalapril Maleate 10 Mg Tabs (Enalapril maleate) .[name]... Take one by mouth daily Her updated medication list for this problem includes:    Adult Aspirin  Low Strength 81 Mg Tbdp (Aspirin) ..... One tab by mouth qday    Lantus 100 Unit/ml Soln (Insulin glargine) .[name]... Take 5 units at bedtime    Lisinopril-hydrochlorothiazide 20-12.5 Mg Tabs (Lisinopril-hydrochlorothiazide) .[name]... Take 1 tab by mouth daily    Novolog 100 Unit/ml Soln (Insulin aspart) ..... 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  Recommended smoking cessation class for her and her husband.  Brochure provided and appointment scheduled for both to attend the class later this month.   Orders: Inital Assessment Each 69min - [place] [phone])  Complete Medication List: 1)  Adult Aspirin Low Strength 81 Mg Tbdp (Aspirin) .... One tab by mouth qday 2)  Lantus 100 Unit/ml Soln (Insulin glargine) .... Take 5 units at bedtime 3)  Pravastatin Sodium 40 Mg Tabs (Pravastatin sodium) .... Take one by mouth daily 4)  Miralax Powd (Polyethylene glycol 3350) .Marland KitchenMarland Kitchen. 17 gm in 8 oz fluid po daily until regular then as needed for  constipation 5)  Ranitidine Hcl 150 Mg Caps (Ranitidine hcl) .... One by mouth two times a day 6)  Nitrostat 0.3 Mg Subl (Nitroglycerin) .... Take under tongue as needed for chest pain, may take up to 3 times every five minutes. 7)  Tramadol Hcl 50 Mg Tabs (Tramadol hcl) .... Take half a pill nightly 8)  Reglan 10 Mg Tabs (Metoclopramide hcl) .... Take one by mouth 10 minutes prior to meals. 9)  Citalopram Hydrobromide 20 Mg Tabs (Citalopram hydrobromide) .... Take one daily for depression 10)  Lisinopril-hydrochlorothiazide 20-12.5 Mg Tabs (Lisinopril-hydrochlorothiazide) .... Take 1 tab by mouth daily 11)  Novolog 100 Unit/ml Soln (Insulin aspart) .... Use as directed 5 units with a small meal, 10 units with large meal 12)  Bd Insulin Syringe Microfine 27g X 5/8" 1 Ml Misc (Insulin syringe-needle u-100) .... Use as directed 13)  Bd Pen Needle Short U/f 31g X 8 Mm Misc (Insulin pen needle) .... Use as directed 14)  Lyrica 75 Mg Caps (Pregabalin) .[name].. 1 two times a day  Patient Instructions: 1)  Nice to meet you 2)  We are giving you samples of lyrica.  Take one pill two times a day  3)  STOP taking your blood pressure medications.  We are giving you a new medication.  It is called Lisinopril/HCTZ.  You are going to take 1 pill in AM 4)  STOP omeprazole 5)  Now do lantus 5 units at bedtime  6)  We are giving you new insulin.  It is called Novolog.  Use 5 units with a small meal and 10 units with a large meal.  If you don't eat then don't use this medicine.  7)  Try to stop smoking!  I am glad you are motivated 8)  Try mio add it to your water.  You should be drinking 8 cups of water a day.  You can get it at any store right nown ([employer], [employer], etc). 9)  Remember food choices!  Fish, vegetables, and complex carbs. 10)  We want to see you again in  3-4 weeks Prescriptions: LYRICA 75 MG CAPS (PREGABALIN) 1 two times a day  #1 x 0   Entered by:   Rinaldo Ratel D   Authorized by:    Hulan Saas DO   Signed by:  Janeann Forehand Pharm D on 11/16/2009   Method used:   Historical   RxID:   [id number] BD PEN NEEDLE SHORT U/F 31G X 8 MM MISC (INSULIN PEN NEEDLE) use as directed  #90 x 3   Entered and Authorized by:   Hulan Saas DO   Signed by:   Hulan Saas DO on 11/16/2009   Method used:   Electronically to        [facility] (retail)       [address]       [place], [place]  [address]       Ph: [phone]       Fax: [phone]   RxID:   [id number] LANTUS 100 UNIT/ML SOLN (INSULIN GLARGINE) take 5 units at bedtime  #1 vial x 3   Entered and Authorized by:   Hulan Saas DO   Signed by:   Hulan Saas DO on 11/16/2009   Method used:   Electronically to        [facility] (retail)       [address]       [place], [place]  [address]       Ph: [phone]       Fax: [phone]   RxID:   [id number] BD INSULIN SYRINGE MICROFINE 27G X 5/8" 1 ML MISC (INSULIN SYRINGE-NEEDLE U-100) use as directed  #200 x 3   Entered and Authorized by:   Hulan Saas DO   Signed by:   Hulan Saas DO on 11/16/2009   Method used:   Electronically to        [facility] (retail)       [address]       [place], [place]  [address]       Ph: [phone]       Fax: [phone]   RxID:   [id number] LISINOPRIL-HYDROCHLOROTHIAZIDE 20-12.5 MG TABS (LISINOPRIL-HYDROCHLOROTHIAZIDE) take 1 tab by mouth daily  #32 x 6   Entered and Authorized by:   Hulan Saas DO   Signed by:   Hulan Saas DO on 11/16/2009   Method used:   Electronically to        [facility] (retail)       [address]       [place], [place]  [address]       Ph: [phone]       Fax: [phone]   RxID:   [id number]   Prevention & Chronic Care Immunizations   Influenza vaccine: Fluvax Non-MCR  (03/22/2009)   Influenza vaccine due: 04/19/2009     Tetanus booster: 09/19/2003: Done.   Tetanus booster due: 09/18/2013    Pneumococcal vaccine: Not documented  Colorectal Screening   Hemoccult: Not documented    Colonoscopy: Not documented   Colonoscopy due: Refused  (08/01/2008)  Other Screening   Pap smear: Not documented   Pap smear due: Not Indicated    Mammogram: Not documented   Smoking status: current  (11/05/2009)   Smoking cessation counseling: yes  (04/19/2008)   Target quit date: 06/23/2007  (05/25/2007)  Diabetes Mellitus   HgbA1C: 10.4  (11/05/2009)   Hemoglobin A1C due: 05/17/2008    Eye exam: diabetic retinopathy  (07/03/2009)   Diabetic eye exam action/deferral: Ophthalmology referral  (03/06/2009)  Eye exam due: 07/2010    Foot exam: yes  (08/29/2009)   Foot exam action/deferral: Do today   High risk foot: Not documented   Foot care education: Not documented    Urine microalbumin/creatinine ratio: Not documented    Diabetes flowsheet reviewed?: Yes   Progress toward A1C goal: Unchanged    Stage of readiness to change (diabetes management): Action  Lipids   Total Cholesterol: 273  (08/01/2008)   LDL: 173  (08/01/2008)   LDL Direct: Not documented   HDL: 58  (08/01/2008)   Triglycerides: 208  (08/01/2008)    SGOT (AST): 16  (08/01/2008)   SGPT (ALT): 23  (08/01/2008)   Alkaline phosphatase: 147  (08/01/2008)   Total bilirubin: 0.5  (08/01/2008)    Lipid flowsheet reviewed?: Yes   Progress toward LDL goal: Unchanged  Hypertension   Last Blood Pressure: 169 / 91  (11/16/2009)   Serum creatinine: 0.64  (08/01/2008)   Serum potassium 4.0  (08/01/2008)    Hypertension flowsheet reviewed?: Yes   Progress toward BP goal: Improved    Stage of readiness to change (hypertension management): Action  Self-Management Support :   Personal Goals (by the next clinic visit) :     Personal A1C goal: 8  (07/18/2009)     Personal blood pressure goal: 140/90  (02/14/2009)     Personal LDL goal: 100   (02/14/2009)    Diabetes self-management support: Written self-care plan  (11/16/2009)   Diabetes care plan printed    Hypertension self-management support: Education handout  (11/16/2009)   Hypertension education handout printed    Lipid self-management support: Education handout  (11/16/2009)     Lipid education handout printed

## 2010-07-16 NOTE — Letter (Signed)
Summary: [facility] PT referral form  [place] PT referral form   Imported By: Tobin Chad 04/16/2010 09:21:35  _____________________________________________________________________  External Attachment:    Type:   Image     Comment:   External Document

## 2010-07-16 NOTE — Progress Notes (Signed)
Summary: triage   Phone Note Call from Patient Call back at Work Phone [phone]   Caller: Patient Summary of Call: Surgar is high 474 yesterday and has been like this for past week.  Work number is [phone] ask for [employer]. Initial call taken by: Raymond Gurney,  January 02, 2010 3:21 PM  Follow-up for Phone Call        "hard for me to function" with it so high. states she wants to lay down & sleep. told her to come at 4 to see Dr. Tye Savoy who has an opening. pcp is full. she will leave work & come here states she lost supply of lantus, now on novalog only when she eats. states she does not eat the way she should. to discuss with md & possibly get referred to [facility] or Dr. Jenne Campus Follow-up by: Elige Radon RN,  January 02, 2010 3:28 PM  Additional Follow-up for Phone Call Additional follow up Details #1::        Spoke w/ Dr. Tye Savoy in clinic. I will see her in clinic today for followup. Appt has been rerouted.   Shanda Howells MD January 02, 2010 4:11 PM

## 2010-07-16 NOTE — Assessment & Plan Note (Signed)
Summary: HAND PAIN   Vital Signs:  Patient profile:   55 year old female Height:      62 inches (157.48 cm) Weight:      142 pounds (64.55 kg) BMI:     26.07 BP sitting:   176 / 91  Vitals Entered By: April Manson CMA (March 01, 2010 11:06 AM)  Primary Care Provider:  Shanda Howells MD   History of Present Illness: 55 yo F f/u R wrist and hand pain.  H/o DeQuervain's, h/o CSI there with relief. Now with some pain and catching in R thumb and middle finger. Worst in in R 3rd finger.  Has been unable to fully extend it for at least 6 months. 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 Would like injection today. H/o DM 2 poorly controlled, A1c last month 11.9%.  Allergies: 1)  Ibuprofen  Physical Exam  General:  Well-developed,well-nourished,in no acute distress; alert,appropriate and cooperative throughout examination Msk:  R hand: palpable nodule just proximal to 3rd MC head.  Unable to fully extend 3rd digit. Thumb with similar palpable nodule proximal to 1st MC head. NVI.   Impression & Recommendations:  Problem # 1:  TRIGGER FINGER, RIGHT MIDDLE (ICD-727.03) Assessment New  patient desires injection today.  Consent obtained and verified. Sterile betadine prep. Furthur cleansed with alcohol. Topical analgesic spray: Ethyl chloride. Joint:3rd digit trigger finger Approached in typical fashion with: direct approach Completed without difficulty Meds: 0.5 cc of 10 mg kenalog and 0.5 cc of 1% lidocaine Needle:21 G 1.5 inch Aftercare instructions and Red flags advised.  F/u as needed or if would like R thumb injected at some point.  Orders: Trigger Point Injection Single Tendon Origin/Insertion [phone]) Kenalog 10 mg inj ([id number])  Complete Medication List: 1)  Adult Aspirin Low Strength 81 Mg Tbdp (Aspirin) .... One tab by mouth qday 2)  Lantus 100 Unit/ml Soln (Insulin glargine) .... Take 30 units daily in am 3)  Miralax Powd (Polyethylene glycol 3350) .Marland KitchenMarland Kitchen. 17 gm in 8 oz  fluid po daily until regular then as needed for constipation 4)  Ranitidine Hcl 150 Mg Caps (Ranitidine hcl) .... One by mouth two times a day 5)  Nitrostat 0.3 Mg Subl (Nitroglycerin) .... Take under tongue as needed for chest pain, may take up to 3 times every five minutes. 6)  Tramadol Hcl 50 Mg Tabs (Tramadol hcl) .... Take half a pill nightly 7)  Reglan 10 Mg Tabs (Metoclopramide hcl) .... Take one by mouth 10 minutes prior to meals. 8)  Citalopram Hydrobromide 20 Mg Tabs (Citalopram hydrobromide) .... Take one daily for depression 9)  Lisinopril-hydrochlorothiazide 20-12.5 Mg Tabs (Lisinopril-hydrochlorothiazide) .... Take 2 pills once daily 10)  Novolog 100 Unit/ml Soln (Insulin aspart) .... Use as directed 5 units with a small meal, 10 units with large meal 11)  Bd Insulin Syringe Microfine 27g X 5/8" 1 Ml Misc (Insulin syringe-needle u-100) .... Use as directed 12)  Bd Pen Needle Short U/f 31g X 8 Mm Misc (Insulin pen needle) .... Use as directed 13)  Lyrica 75 Mg Caps (Pregabalin) .[name].. 1 two times a day 14)  Fluconazole 50 Mg Tabs (Fluconazole) .... Take 3 tabs once

## 2010-07-16 NOTE — Assessment & Plan Note (Signed)
Summary: Diabetes and Hypertension Followup   Vital Signs:  Patient profile:   54 year old female Weight:      140.2 pounds Temp:     98.5 degrees F oral Pulse rate:   99 / minute BP sitting:   173 / 104  (left arm) Cuff size:   regular  Vitals Entered By: Audelia Hives CMA (January 30, 2010 2:26 PM)  Primary Care Provider:  Shanda Howells MD   History of Present Illness: 65 YOF w/ PMHx/o HTN, DM here for diabetic and hypertensive followup.   CBGs- improved now using 35 lantus. CBGs now in 100s to 200s vs 200s to 400s at most recent visit.  Resolved vision changes, CP, polyuria, polydypsia. Still only intermittently using novolog meal coverage.   HTN- BPs in 140s-160s at home. Compliant on current regimen.   Cardiovascular Risk History:      Positive major cardiovascular risk factors include diabetes, hyperlipidemia, hypertension, and current tobacco user.  Negative major cardiovascular risk factors include female age less than 75 years old.        Positive history for target organ damage include cardiac end organ damage (either CHF or LVH), peripheral vascular disease, and hypertensive retinopathy.  Further assessment for target organ damage reveals no history of stroke/TIA or renal insufficiency.     Allergies: 1)  Ibuprofen  Physical Exam  General:  alert and well-developed.   Head:  normocephalic and atraumatic.   Eyes:  vision grossly intact, pupils equal, and pupils round.   Ears:  R ear normal and L ear normal.   Mouth:  no gingival abnormalities and teeth missing.   Neck:  supple and full ROM.   Lungs:  CTAB, no wheezes, rales, rhoncii Heart:  RRR, no rubs, gallops, murmurs Abdomen:  S/NT/+bowel sounds  Extremities:  2+ peripheral pulses   Impression & Recommendations:  Problem # 1:  DIABETES MELLITUS, TYPE II (ICD-250.00) Blood sugars improved w/ 35 units of lantus. Improved novolog meal coverage but still room to improve.  Reemphasized need for appropriate  meal coverage. Plan to followup in 1-2 weeks. May considder addition of glipizide at next visit to help w/ blood sugar control. Pt reports severe intolerance to metformin.  Her updated medication list for this problem includes:    Adult Aspirin Low Strength 81 Mg Tbdp (Aspirin) ..... One tab by mouth qday    Lantus 100 Unit/ml Soln (Insulin glargine) .[name]... Take 30 units daily in am    Lisinopril-hydrochlorothiazide 20-12.5 Mg Tabs (Lisinopril-hydrochlorothiazide) .[name]... Take 1 tab by mouth daily    Novolog 100 Unit/ml Soln (Insulin aspart) ..... Use as directed 5 units with a small meal, 10 units with large meal  Orders: [place]- Est Level  3 [id number])  Problem # 2:  HYPERTENSION, BENIGN ESSENTIAL, LABILE (ICD-401.1) BPs improved w/ Lis-HCTZ and norvasc. BP 162/82 on manual recheck. Will add metoprolol for improved BP control. Will followup in 1-2 weeks.  Her updated medication list for this problem includes:    Lisinopril-hydrochlorothiazide 20-12.5 Mg Tabs (Lisinopril-hydrochlorothiazide) .[name]... Take 1 tab by mouth daily    Norvasc 10 Mg Tabs (Amlodipine besylate) .[name]... Take 1 tablet daily    Lopressor 50 Mg Tabs (Metoprolol tartrate) .[name]... Take 1/2 tablet twice daily  Orders: [place]- Est Level  3 [id number])  Problem # 3:  HYPERLIPIDEMIA (ICD-272.4)  Pt reports inabaility to afford lipitor. Currently in process of applying for medication assistance program. Will restart pravastatin at maximum dosage while medication assistance is completed.   Her  updated medication list for this problem includes:    Pravastatin Sodium 80 Mg Tabs (Pravastatin sodium) .[name]... Take 1 tablet daily  Complete Medication List: 1)  Adult Aspirin Low Strength 81 Mg Tbdp (Aspirin) .... One tab by mouth qday 2)  Lantus 100 Unit/ml Soln (Insulin glargine) .... Take 30 units daily in am 3)  Miralax Powd (Polyethylene glycol 3350) .Marland KitchenMarland Kitchen. 17 gm in 8 oz fluid po daily until regular then as needed for constipation 4)  Ranitidine Hcl  150 Mg Caps (Ranitidine hcl) .... One by mouth two times a day 5)  Nitrostat 0.3 Mg Subl (Nitroglycerin) .... Take under tongue as needed for chest pain, may take up to 3 times every five minutes. 6)  Tramadol Hcl 50 Mg Tabs (Tramadol hcl) .... Take half a pill nightly 7)  Reglan 10 Mg Tabs (Metoclopramide hcl) .... Take one by mouth 10 minutes prior to meals. 8)  Citalopram Hydrobromide 20 Mg Tabs (Citalopram hydrobromide) .... Take one daily for depression 9)  Lisinopril-hydrochlorothiazide 20-12.5 Mg Tabs (Lisinopril-hydrochlorothiazide) .... Take 1 tab by mouth daily 10)  Novolog 100 Unit/ml Soln (Insulin aspart) .... Use as directed 5 units with a small meal, 10 units with large meal 11)  Bd Insulin Syringe Microfine 27g X 5/8" 1 Ml Misc (Insulin syringe-needle u-100) .... Use as directed 12)  Bd Pen Needle Short U/f 31g X 8 Mm Misc (Insulin pen needle) .... Use as directed 13)  Lyrica 75 Mg Caps (Pregabalin) .[name].. 1 two times a day 14)  Fluconazole 50 Mg Tabs (Fluconazole) .... Take 3 tabs once 15)  Norvasc 10 Mg Tabs (Amlodipine besylate) .... Take 1 tablet daily 16)  Neurontin 300 Mg Caps (Gabapentin) .... Take 1 tablet at night on day 1, 1 tablet twice on day 2, 1 tablet 3 times daily thereafter for neuropathic pain 17)  Lopressor 50 Mg Tabs (Metoprolol tartrate) .... Take 1/2 tablet twice daily 18)  Pravastatin Sodium 80 Mg Tabs (Pravastatin sodium) .... Take 1 tablet daily  Cardiovascular Risk Assessment/Plan:      The patient's hypertensive risk group is category C: Target organ damage and/or diabetes.  Her calculated 10 year risk of coronary heart disease is > 32 %.  Today's blood pressure is 173/104.  Her blood pressure goal is < 130/80.  Patient Instructions: 1)  It was good to see you today 2)  Your blood sugars are getting better  3)  Take 5 units of insulin if your blood sugars are greater than 150 after meals 4)  I am adding a medication called metoprolol to help with your  blood pressures 5)  Come back in 1-2 weeks to work on your hand pain  6)  If you have any further questions, feel free to give [place] a call 7)  Thank you and God Bless, 8)  Shanda Howells MD Prescriptions: PRAVASTATIN SODIUM 80 MG TABS (PRAVASTATIN SODIUM) take 1 tablet daily  #30 x 3   Entered and Authorized by:   Shanda Howells MD   Signed by:   Shanda Howells MD on 01/30/2010   Method used:   Electronically to        [facility] (retail)       [address]       [place], [place]  [address]       Ph: [phone]       Fax: [phone]   RxID:   [id number] LOPRESSOR 50 MG TABS (  METOPROLOL TARTRATE) take 1/2 tablet twice daily  #30 x 1   Entered and Authorized by:   Shanda Howells MD   Signed by:   Shanda Howells MD on 01/30/2010   Method used:   Electronically to        [facility] (retail)       [address]       [place], [place]  [address]       Ph: [phone]       Fax: [phone]   RxID:   [id number]

## 2010-07-16 NOTE — Progress Notes (Signed)
   Phone Note Other Incoming   Caller: [facility] Details for Reason: CBG elevated Summary of Call: CBG 218, noted on BMET, to PCP in am

## 2010-07-16 NOTE — Assessment & Plan Note (Signed)
Summary: f/u DM   Vital Signs:  Patient profile:   55 year old female Weight:      143.6 pounds BMI:     26.36 Temp:     98.3 degrees F Pulse rate:   96 / minute BP sitting:   172 / 95  (left arm)  Vitals Entered By: Geanie Cooley RN (July 18, 2009 11:26 AM) CC: f/u Is Patient Diabetic? Yes Pain Assessment Patient in pain? yes     Location: back Intensity: 8   Primary Care Provider:  Ria Bush  MD  CC:  f/u.  History of Present Illness: CC: f/u HTN, DM, back pain   1. HTN - back on Enalapril , but still elevated BP to 172/95.  States compliant with meds.    2. DM - went to diabetes education center, to go for nutrition education in near future as well as teaching on carb counting.  Notes sugars seem more easy to control since starting log, notes when she misses her lantus 35u next AM sugars up to 200's.  otherwise, fasting CBGs have been 90s to 160s.  3. neck and shoulder pain/spasms - Happy with PT, states notes improvement in ROM.  to continue for 4 more weeks.  4. back pain - 3day history of left lower back pain, started suddenly, worse with sleeping.  Starts in lower back, travels to lateral hip and into stomache.  PT told her to sleep with pillow below right side.  Has tried alleve which helps, tramadol still making too sleepy.  Advised to only use tylenol not NSAIDs given HTN and GERD.  + shooting pain from lower back to mid thigh laterally.  no saddle anesthesia or fevers, or bowel/bladder incontinence.  No h/o cancer  Of note, patient states [facility] will no longer be able to provide patient with Lyrica/Lantus, needs to find alternatives.  Only has enough left for about 3 months.  Habits & Providers  Alcohol-Tobacco-Diet     Tobacco Status: current     Cigarette Packs/Day: 0.5  Current Medications (verified): 1)  Adult Aspirin Low Strength 81 Mg  Tbdp (Aspirin) .... One Tab By Mouth Qday 2)  Enalapril Maleate 20 Mg Tabs (Enalapril Maleate) .... Take One  Pill Daily 3)  Lantus 100 Unit/ml Soln (Insulin Glargine) .... 35 Units Q Am 4)  Pravastatin Sodium 40 Mg Tabs (Pravastatin Sodium) .... Take One By Mouth Daily 5)  Lyrica 75 Mg  Caps (Pregabalin) .... Two Times A Day 6)  Miralax  Powd (Polyethylene Glycol 3350) .Marland KitchenMarland KitchenMarland Kitchen 17 Gm in 8 Oz Fluid Po Daily Until Regular Then As Needed For Constipation 7)  Protonix 40 Mg Tbec (Pantoprazole Sodium) .... One Daily 8)  Nitrostat 0.3 Mg Subl (Nitroglycerin) .... Take Under Tongue As Needed For Chest Pain, May Take Up To 3 Times Every Five Minutes. 9)  Tramadol Hcl 50 Mg Tabs (Tramadol Hcl) .... Take Half A Pill Nightly 10)  Reglan 10 Mg Tabs (Metoclopramide Hcl) .... Take One By Mouth 10 Minutes Prior To Meals. 11)  Methocarbamol 500 Mg Tabs (Methocarbamol) .... Take Three Times Daily As Needed For Muscle Spasm  Allergies (verified): 1)  Ibuprofen  Past History:  Past medical, surgical, family and social histories (including risk factors) reviewed for relevance to current acute and chronic problems.  Past Medical History: Reviewed history from 03/01/2009 and no changes required. Has received Hep B series immunizations, h/o non-compliance Diabetes mellitus, type II Hypercholesterolemia HTN h/o Trigger fingers (R index inj 12/08, Bilateral  thumb inj 11/09, R thumb and dequervain's injections 01/2009) ? glaucoma (h/o peripheral iridectomy), ? small cataracts  Past Surgical History: Reviewed history from 03/01/2009 and no changes required. Perirpheral iridectomy - 02/12/2005 Cardiac Cath-normal LV fxn EF>60% - 05/06/2004 s/p abdominal hysterectomy 1986 (fibroids)  Family History: Reviewed history from 05/25/2007 and no changes required. Father died at 45 of MI, Mother died at 73 with diabetes and heart dz, Sister awaiting CABG, Strong family h/o CAD and MI Salley Scarlet' smoker as of 12/08  Social History: Reviewed history from 03/01/2009 and no changes required. Lives with five grandkids .  She works  full-time at [employer]. ; [place] daily.  denies other rec drugs Smokes 1/2 ppd; Finances are a major issue for her.  Meeting with [facility].  Rejected by Healthcare sharing initiative. Currently receiving med assistance for  lantus through our office and lyrica; 30 pack year h/o smoking. Has questionable h/o etoh abuse.  Has not had any etoh since her mother died in 2002-09-14; married.  Physical Exam  General:  alert and well-developed.   Lungs:  Normal respiratory effort, chest expands symmetrically. Lungs are clear to auscultation, no crackles or wheezes. Heart:  Normal rate and regular rhythm. S1 and S2 normal without gallop, murmur, click, rub or other extra sounds.  Abdomen:  Bowel sounds positive,abdomen soft and non-tender without masses, organomegaly or hernias noted. Msk:  + midline tenderness along lumbar spine, no paraspinous tenderness.  + SI joint pain and greater trochanteric pain.  + pain with int/ext rotation at hip Left but pain localized to lateral hip, not groin.  neg straight leg raise. Pulses:  2+ pulses bilaterally Extremities:  no c/c/e  Diabetes Management Exam:    Foot Exam (with socks and/or shoes not present):       Sensory-Pinprick/Light touch:          Left medial foot (L-4): normal          Left dorsal foot (L-5): normal          Left lateral foot (S-1): normal          Right medial foot (L-4): normal          Right dorsal foot (L-5): normal          Right lateral foot (S-1): normal       Sensory-Monofilament:          Left foot: normal          Right foot: normal       Inspection:          Left foot: normal          Right foot: abnormal             Comments: 5th digit and 4th digit with callus x 2       Nails:          Left foot: normal          Right foot: normal    Eye Exam:       Eye Exam done elsewhere          Date: 07/03/2009          Results: diabetic retinopathy          Done by: Dr. Harriette Bouillon   Impression &  Recommendations:  Problem # 1:  SHOULDER PAIN, BILATERAL (ICD-719.41)  improving with PT.  flexeril didn't help.   The following medications were removed from the medication list:  Flexeril 5 Mg Tabs (Cyclobenzaprine hcl) .[name]... Take one by mouth two times a day as needed muscle spasm Her updated medication list for this problem includes:    Adult Aspirin Low Strength 81 Mg Tbdp (Aspirin) ..... One tab by mouth qday    Tramadol Hcl 50 Mg Tabs (Tramadol hcl) .[name]... Take half a pill nightly    Methocarbamol 500 Mg Tabs (Methocarbamol) .[name]... Take three times daily as needed for muscle spasm  Orders: [place]- Est  Level 4 [id number])  Problem # 2:  LUMBAR SPRAIN AND STRAIN (ICD-847.2)  change to methocarbamol as flexeril didn't help.  treat conservatively with tylenol, methocarbamol x 2 wks, then return.  return sooner if worsening, or red flags.  ? sacroiliitis (provided with hand out for this today with exercises).  ? herniated disc.  given all of her continuing conmplaints, will need to evaluate patient for fibromyalgia.  Orders: [place]- Est  Level 4 [id number])  Problem # 3:  DIABETES MELLITUS, TYPE II (ICD-250.00) A1c continues to decrease!  8.1 % today.  continue current treatment. Her updated medication list for this problem includes:    Adult Aspirin Low Strength 81 Mg Tbdp (Aspirin) ..... One tab by mouth qday    Lisinopril-hydrochlorothiazide 20-12.5 Mg Tabs (Lisinopril-hydrochlorothiazide) .[name]... Take one daily    Lantus 100 Unit/ml Soln (Insulin glargine) .Marland KitchenMarland KitchenMarland KitchenMarland Kitchen 35 units q am  Labs Reviewed: Creat: 0.64 (08/01/2008)   Microalbumin: 1+ (08/01/2008)  Last Eye Exam: diabetic retinopathy (07/03/2009) Reviewed HgBA1c results: 8.3 (07/18/2009)  9.1 (04/27/2009)  Problem # 4:  HYPERTENSION, BENIGN ESSENTIAL, LABILE (ICD-401.1) remains uncontrolled.  will add HCTZ to this regimen.  (enalapril HCTZ high doses not on [facility] $4 formulary so will change to lisinopril HCTZ 20/12.5.   Her updated  medication list for this problem includes:    Lisinopril-hydrochlorothiazide 20-12.5 Mg Tabs (Lisinopril-hydrochlorothiazide) .[name]... Take one daily  Orders: [facility]- Est  Level 4 ([address])  BP today: 172/95 Prior BP: 179/103 (06/04/2009)  Labs Reviewed: K+: 4.0 (08/01/2008) Creat: : 0.64 (08/01/2008)   Chol: 273 (08/01/2008)   HDL: 58 (08/01/2008)   LDL: 173 (08/01/2008)   TG: 208 (08/01/2008)  Complete Medication List: 1)  Adult Aspirin Low Strength 81 Mg Tbdp (Aspirin) .... One tab by mouth qday 2)  Lisinopril-hydrochlorothiazide 20-12.5 Mg Tabs (Lisinopril-hydrochlorothiazide) .... Take one daily 3)  Lantus 100 Unit/ml Soln (Insulin glargine) .... 35 units q am 4)  Pravastatin Sodium 40 Mg Tabs (Pravastatin sodium) .... Take one by mouth daily 5)  Lyrica 75 Mg Caps (Pregabalin) .... Two times a day 6)  Miralax Powd (Polyethylene glycol 3350) .Marland KitchenMarland Kitchen. 17 gm in 8 oz fluid po daily until regular then as needed for constipation 7)  Protonix 40 Mg Tbec (Pantoprazole sodium) .... One daily 8)  Nitrostat 0.3 Mg Subl (Nitroglycerin) .... Take under tongue as needed for chest pain, may take up to 3 times every five minutes. 9)  Tramadol Hcl 50 Mg Tabs (Tramadol hcl) .... Take half a pill nightly 10)  Reglan 10 Mg Tabs (Metoclopramide hcl) .... Take one by mouth 10 minutes prior to meals. 11)  Methocarbamol 500 Mg Tabs (Methocarbamol) .... Take three times daily as needed for muscle spasm  Other Orders: [facility] [id number])  Patient Instructions: 1)  Return in 2-4 weeks. 2)  Tramadol on weekends (when it won't make you sleepy). 3)  Methocarbamol for muscle relaxant as flexeril didn't help. 4)  Good job with the diabetes log! 5)  We'll do a combo medicine for blood pressure - start  when you run out of your current meds. 6)  We will talk about fibromyalgia next visit. 7)  Call clinic with questions. Prescriptions: LISINOPRIL-HYDROCHLOROTHIAZIDE 20-12.5 MG TABS (LISINOPRIL-HYDROCHLOROTHIAZIDE) take one  daily  #32 x 3   Entered and Authorized by:   Ria Bush  MD   Signed by:   Ria Bush  MD on 07/18/2009   Method used:   Electronically to        [facility] (retail)       [address]       [place], [place]  [address]       Ph: [phone]       Fax: [phone]   RxID:   [id number] METHOCARBAMOL 500 MG TABS (METHOCARBAMOL) take three times daily as needed for muscle spasm  #30 x 0   Entered and Authorized by:   Ria Bush  MD   Signed by:   Ria Bush  MD on 07/18/2009   Method used:   Electronically to        [facility] (retail)       [address]       [place], [place]  [address]       Ph: [phone]       Fax: [phone]   RxID:   [id number]    Vital Signs:  Patient profile:   55 year old female Weight:      143.6 pounds BMI:     26.36 Temp:     98.3 degrees F Pulse rate:   96 / minute BP sitting:   172 / 95  (left arm)  Vitals Entered By: Geanie Cooley RN (July 18, 2009 11:26 AM)   Laboratory Results   Blood Tests   Date/Time Received: July 18, 2009 11:33 AM  Date/Time Reported: July 18, 2009 11:58 AM   HGBA1C: 8.3%   (Normal Range: Non-Diabetic - 3-6%   Control Diabetic - 6-8%)  Comments: ...............test performed by......Marland KitchenBonnie A. Martinique, MLS (ASCP)cm      Prevention & Chronic Care Immunizations   Influenza vaccine: Fluvax Non-MCR  (03/22/2009)   Influenza vaccine due: 04/19/2009    Tetanus booster: 09/19/2003: Done.   Tetanus booster due: 09/18/2013    Pneumococcal vaccine: Not documented  Colorectal Screening   Hemoccult: Not documented    Colonoscopy: Not documented   Colonoscopy due: Refused  (08/01/2008)  Other Screening   Pap smear: Not documented   Pap smear due: Not Indicated    Mammogram: Not documented   Smoking status: current  (07/18/2009)   Smoking cessation counseling: yes  (04/19/2008)   [employer]  quit date: 06/23/2007  (05/25/2007)  Diabetes Mellitus   HgbA1C: 8.3  (07/18/2009)   Hemoglobin A1C due: 05/17/2008    Eye exam: diabetic retinopathy  (07/03/2009)   Diabetic eye exam action/deferral: Ophthalmology referral  (03/06/2009)   Eye exam due: 07/2010    Foot exam: yes  (07/18/2009)   Foot exam action/deferral: Do today   High risk foot: Not documented   Foot care education: Not documented    Urine microalbumin/creatinine ratio: Not documented    Diabetes flowsheet reviewed?: Yes   Progress toward A1C goal: Improved  Lipids   Total Cholesterol: 273  (08/01/2008)   LDL: 173  (08/01/2008)   LDL Direct: Not documented   HDL: 58  (08/01/2008)   Triglycerides: 208  (08/01/2008)  SGOT (AST): 16  (08/01/2008)   SGPT (ALT): 23  (08/01/2008)   Alkaline phosphatase: 147  (08/01/2008)   Total bilirubin: 0.5  (08/01/2008)    Lipid flowsheet reviewed?: Yes   Progress toward LDL goal: Unchanged  Hypertension   Last Blood Pressure: 172 / 95  (07/18/2009)   Serum creatinine: 0.64  (08/01/2008)   Serum potassium 4.0  (08/01/2008)    Hypertension flowsheet reviewed?: Yes   Progress toward BP goal: Unchanged  Self-Management Support :   Personal Goals (by the next clinic visit) :     Personal A1C goal: 8  (07/18/2009)     Personal blood pressure goal: 140/90  (02/14/2009)     Personal LDL goal: 100  (02/14/2009)    Diabetes self-management support: Copy of home glucose meter record, CBG self-monitoring log, Written self-care plan, Referred for self-management class, Referred for DM self-management training, Referred for medical nutrition therapy  (07/18/2009)   Diabetes care plan printed    Hypertension self-management support: BP self-monitoring log, Written self-care plan, Education handout  (07/18/2009)   Hypertension self-care plan printed.   Hypertension education handout printed    Lipid self-management support: Written self-care plan, Education handout   (07/18/2009)   Lipid self-care plan printed.   Lipid education handout printed

## 2010-07-16 NOTE — Consult Note (Signed)
Summary: [facility]- non-proliferative DM retinopathy  [facility]   Imported By: Beryle Lathe 07/04/2009 16:01:06  _____________________________________________________________________  External Attachment:    Type:   Image     Comment:   External Document

## 2010-07-16 NOTE — Assessment & Plan Note (Signed)
Summary: Facial numbness/see note/newton   Vital Signs:  Patient profile:   55 year old female Height:      62 inches Weight:      138.8 pounds BMI:     25.48 Temp:     98.4 degrees F oral Pulse rate:   106 / minute BP sitting:   152 / 83  (right arm) Cuff size:   regular  Vitals Entered By: Enid Skeens, CMA (April 23, 2010 1:55 PM) CC: left side facial numbess x4 days Is Patient Diabetic? No Pain Assessment Patient in pain? no        Primary Care Provider:  Shanda Howells MD  CC:  left side facial numbess x4 days.  History of Present Illness: Patient has four days of a numb feeling on the left side of her face.  She is able to chew, speak, and move her face fine.  It feels like someone gave her an injection and it is wearing off.  She denies any other neurological deficits.  On review of her PMH she has poorly controlled DM, she stopped taking her Humalog as the last time she took it her blood sugar went to 28 and her husband called EMS.  She says that she does not eat well.  She reports taking her Lantus but her blood sugars have been in the 400s recently.  When asked about depression on review of her med profile she started crying. She was prescribed citalopram in March and did not take it.  She descibes being on Effexor but it made her very tired and she could not go to work.  She works as a Chief Strategy Officer.  Habits & Providers  Alcohol-Tobacco-Diet     Tobacco Status: current  Allergies: 1)  Ibuprofen  Review of Systems      See HPI  Physical Exam  General:  Quiet, spoke slowly Eyes:  pupils equal, pupils round, pupils reactive to light, corneas and lenses clear, no optic disk abnormalities, and no nystagmus.   Mouth:  poor dentition.   Lungs:  normal respiratory effort and normal breath sounds.   Heart:  normal rate and regular rhythm.   Neurologic:  Facial symmetry, + corneal reflexes ( CN V)strength normal in all extremities, sensation intact to light  touch, sensation intact to pinprick, gait normal, and DTRs symmetrical and normal.     Impression & Recommendations:  Problem # 1:  DISTURBANCE OF SKIN SENSATION (ICD-782.0) subjective facial numbness  with normal motor and intact corneal reflex.  Poorly controlled diabetes could cause sensory mononeuropathy but corneal reflex very brisk.  Suspect severe depressive illness, recommended resuming SSRI and follow up with primary MD.  Has Bipolar II diagnosis and concern that she is not on a mood stablizer. Orders: [place]- Est Level  3 [id number])  Problem # 2:  BIPOLAR II DISORDER (ICD-296.89) added SSRI but not on a mood stablizer, to return to see primary MD.  I question if she will take the antideprssant  Problem # 3:  DIABETES MELLITUS, TYPE II (ICD-250.00) poorly controlled in past, A1C today at 8.65, not taking Novolog as it caused one episode of hypoglycemia and she is fearful or repeating; increased Lantus to 40 units. Her updated medication list for this problem includes:    Adult Aspirin Low Strength 81 Mg Tbdp (Aspirin) ..... One tab by mouth qday    Lantus 100 Unit/ml Soln (Insulin glargine) .[name]... Take 40 units daily in am    Novolog 100 Unit/ml  Soln (Insulin aspart) .[name]... Pt is not taking use as directed 5 units with a small meal, 10 units with large meal  Orders: A1C-[facility] [id number]) [place]- Est Level  3 [id number])  Complete Medication List: 1)  Adult Aspirin Low Strength 81 Mg Tbdp (Aspirin) .... One tab by mouth qday 2)  Lantus 100 Unit/ml Soln (Insulin glargine) .... Take 40 units daily in am 3)  Miralax Powd (Polyethylene glycol 3350) .Marland KitchenMarland Kitchen. 17 gm in 8 oz fluid po daily until regular then as needed for constipation 4)  Ranitidine Hcl 150 Mg Caps (Ranitidine hcl) .... One by mouth two times a day 5)  Nitrostat 0.3 Mg Subl (Nitroglycerin) .... Take under tongue as needed for chest pain, may take up to 3 times every five minutes. 6)  Tramadol Hcl 50 Mg Tabs (Tramadol hcl) .... Take half a pill  nightly 7)  Citalopram Hydrobromide 20 Mg Tabs (Citalopram hydrobromide) .... Take one daily for depression 8)  Novolog 100 Unit/ml Soln (Insulin aspart) .... Pt is not taking use as directed 5 units with a small meal, 10 units with large meal 9)  Bd Insulin Syringe Microfine 27g X 5/8" 1 Ml Misc (Insulin syringe-needle u-100) .... Use as directed 10)  Bd Pen Needle Short U/f 31g X 8 Mm Misc (Insulin pen needle) .... Use as directed  Patient Instructions: 1)  Begin the antidepressant 2)  Follow up with Dr.Newton in one month 3)  Increase you Lantus 40 untis Prescriptions: CITALOPRAM HYDROBROMIDE 20 MG TABS (CITALOPRAM HYDROBROMIDE) take one daily for depression  #30 x 1   Entered and Authorized by:   Tereasa Coop NP   Signed by:   Tereasa Coop NP on 04/23/2010   Method used:   Electronically to        [facility] (retail)       [address]       [place], [place]  [address]       Ph: [phone]       Fax: [phone]   RxID:   [id number]    Orders Added: 1)  A1C-[facility] [83036] 2)  [facility]- Est Level  3 [99213]    Laboratory Results   Blood Tests   Date/Time Received: April 23, 2010 3:03 PM  Date/Time Reported: April 23, 2010 3:21 PM   HGBA1C: 8.6%   (Normal Range: Non-Diabetic - 3-6%   Control Diabetic - 6-8%)  Comments: ...........test performed by...........Marland KitchenHedy Camara, CMA

## 2010-07-16 NOTE — Letter (Signed)
Summary: Out of Work  [place]  [address]   [place], [place] [address]   Phone: [phone]  Fax: [phone]    April 15, 2010   Employee:  [name]    To Whom It May Concern:   For Medical reasons, please excuse the above named employee from work but is cleared to return  on Wednesday, April 17, 2010. If you need additional information, please feel free to contact our office.         Sincerely,    Dorcas Mcmurray, M.D./Neeton Laurance Flatten CMA

## 2010-07-16 NOTE — Progress Notes (Signed)
Summary: Lantus   Phone Note Outgoing Call   Summary of Call: Called to inquire about Lantus.  Spoke with husband Mr .Florio.  He says that when they went to [facility] today, told she's not on MAP program (although patient has debra hill and has received Lantus thorugh MAP in past.)  He says that he thinks that we gave her sample of Lantus today, He will check with her when she arrives.  Advised to call us tomorrow to find out if we can help get her MAP figured out, and to provide sample of Lantus IF we didnt' already give her one today. Initial call taken by: Ria Bush  MD,  August 29, 2009 5:27 PM  Follow-up for Phone Call        did we give Jarrett Lantus samples (ran out on Wednedsay)  If we didn't I would like [place] to call her to have her come in for samples if still hasn't obtrained from MAP health departnemtn. Follow-up by: Ria Bush  MD,  August 31, 2009 12:05 PM  Additional Follow-up for Phone Call Additional follow up Details #1::        called pt lmom to call back Additional Follow-up by: Schuyler Amor CMA,  August 31, 2009 4:49 PM

## 2010-07-16 NOTE — Progress Notes (Signed)
Summary: triage   Phone Note Call from Patient Call back at Work Phone [phone] Call back at -ask for [facility]: Patient Summary of Call: having numbness on left side of face and hand - started saturday Initial call taken by: Audie Clear,  April 22, 2010 3:59 PM  Follow-up for Phone Call        Pt currently at work at a [employer].  Describes the following symptoms; left facial numbness, numbness in left hand and arm and a slight HA.  Asked that she have her supervisor check her BP.  It was 160/100.  Asked supervisor Santiago Glad) to have patient smile and report symmetry - no assymetry noted.  Asked her to to have pt stick out her tongue, no deviation noted.  Then asked her to have pt lift her arms palms down, no drift noted.  Advised pt that she needs to be seen asap by a physician.  Pt states she did not want to leave work - husband drives her to work and he's at work as well.  She works unitl 11 pm tonight.  Asked if there was an [facility] near by and supervisor reports no.  Reiterated that I strongly advised that she be seen as soon as possible, pt kept saying that she rather wait until after work.  Spoke again with Santiago Glad and told her that pt needs to be observed for the rest of the evening and needs to have her BP checked periodically.  If she begins to show any worsening of symptoms or if BP continues to rise EMS needs to be called.  Even if symptoms and/or BP do not worsen she still needs to go to the nearest [facility] or ED as soon as she gets off work.  Both agreeable. Follow-up by: Elray Mcgregor RN,  April 22, 2010 4:14 PM  Additional Follow-up for Phone Call Additional follow up Details #1::        Called pt back this am.  She did not go to [facility] as instructed.  States that facial numbness is a little better but still having HA.  Told her to be here at 1:15 and we would work her in.  Pt agreeable. Additional Follow-up by: Elray Mcgregor RN,  April 23, 2010 9:02 AM

## 2010-07-16 NOTE — Miscellaneous (Signed)
Summary: Re: podiatry referal.   Clinical Lists Changes    patient's [facility] card has expired . she is currently working on getting it updated. advised patient to bring card in as soon as she gets it because we do need that before  referral can be sent to [facility]. Marcell Barlow RN  August 30, 2009 12:10 PM  thanks. Ria Bush  MD  August 30, 2009 2:26 PM

## 2010-07-16 NOTE — Miscellaneous (Signed)
Summary: re: rx   Clinical Lists Changes called pt notified of lantus here at the office ready for her to come and pick up. pt states she will pick up on monday.Schuyler Amor CMA  February 08, 2010 2:11 PM

## 2010-07-16 NOTE — Letter (Signed)
Summary: Out of Work  [facility]  [address]   [place],  [address]   Phone: [phone]  Fax: [phone]    March 01, 2010   Employee:  QUASHONDA FANTIN    To Whom It May Concern:   For Medical reasons, please excuse the above named employee from work for the following dates:  Start:  March 01, 2010   End:  March 02, 2010   If you need additional information, please feel free to contact our office.         Sincerely,    April Manson CMA

## 2010-07-16 NOTE — Assessment & Plan Note (Signed)
Summary: f/u DM, HTN   Vital Signs:  Patient profile:   55 year old female Height:      62 inches Weight:      142.4 pounds BMI:     26.14 Pulse rate:   100 / minute BP sitting:   195 / 99  (right arm)  Vitals Entered By: Gerrit Heck slade,cma  Serial Vital Signs/Assessments:  Time      Position  BP       Pulse  Resp  Temp     By                     180/90                         Ria Bush  MD  CC: f/up HTN. refill meds.( glucose 278/54) Is Patient Diabetic? Yes Pain Assessment Patient in pain? yes     Location: body Intensity: 5 Onset of pain  Chronic   Primary Care Provider:  Ria Bush  MD  CC:  f/up HTN. refill meds.( glucose 278/54).  History of Present Illness: CC: f/u HTN ,DM   1. HTN - hasn't started combo pill, taking only lisinpril, wanted to finish pills prior to switch.  advised to start combo pill.  2. DM - Has one-on-one scheduled with Maggie in near future.  Advised by Burman Nieves to start taking insulin at night, check sugars and eat snack if sugar less than 140.  fasting cbg yesterday am was 54, pt felt shakey and diaphoretic.  Took snack and felt better.  otherwise doing well.  3. Depression - pt endorses SI with plan (taking pills) but contracts for safety, states doesn't go through because starts thinking about things and realizes she doesn't really want to die.  Endorses poor sleep, decreased appetite, anhedonia, decreased energy, and decreased concentration.  States had been told she was bipolar in past, but currently denies grandiosity or other manic sxs.  Previously on zoloft which she didn't like because of sexual dysfunction side fx.  Previously on seroquel as well, stopped 2/2 cost.  interested in trying pharmacotherapy again.  Asked to get ROI from mental health.   4. pain throughout body, centered on hands again - states right hand starting to act up again.  taking tylenol, tramadol, and soaking hands in warm water to help.  difficulty at  work given she cannot forcefully grip things 2/2 pain.  Discussed fibromyalgia possibility.  Pt currently on lyrica.  Pt on lyrica and lantus, states MAP no longer providing either.  Discussed with Dr. Chriss Driver, Lantus remains on MAP, lyrica no longer the case.  Habits & Providers  Alcohol-Tobacco-Diet     Tobacco Status: current     Tobacco Counseling: to quit use of tobacco products  Current Medications (verified): 1)  Adult Aspirin Low Strength 81 Mg  Tbdp (Aspirin) .... One Tab By Mouth Qday 2)  Lisinopril-Hydrochlorothiazide 20-12.5 Mg Tabs (Lisinopril-Hydrochlorothiazide) .... Take One Daily 3)  Lantus 100 Unit/ml Soln (Insulin Glargine) .... 35 Units Q Pm 4)  Pravastatin Sodium 40 Mg Tabs (Pravastatin Sodium) .... Take One By Mouth Daily 5)  Lyrica 75 Mg  Caps (Pregabalin) .... Two Times A Day 6)  Miralax  Powd (Polyethylene Glycol 3350) .Marland KitchenMarland KitchenMarland Kitchen 17 Gm in 8 Oz Fluid Po Daily Until Regular Then As Needed For Constipation 7)  Ranitidine Hcl 150 Mg Caps (Ranitidine Hcl) .... One By Mouth Two Times A Day 8)  Nitrostat 0.3  Mg Subl (Nitroglycerin) .... Take Under Tongue As Needed For Chest Pain, May Take Up To 3 Times Every Five Minutes. 9)  Tramadol Hcl 50 Mg Tabs (Tramadol Hcl) .... Take Half A Pill Nightly 10)  Reglan 10 Mg Tabs (Metoclopramide Hcl) .... Take One By Mouth 10 Minutes Prior To Meals. 11)  Methocarbamol 500 Mg Tabs (Methocarbamol) .... Take Three Times Daily As Needed For Muscle Spasm 12)  Pantoprazole Sodium 40 Mg Tbec (Pantoprazole Sodium) .... One Daily  Allergies (verified): 1)  Ibuprofen  Past History:  Past Medical History: Has received Hep B series immunizations, h/o non-compliance Diabetes mellitus, type II Hypercholesterolemia HTN h/o Trigger fingers (R index inj 12/08, Bilateral thumb inj 11/09, R thumb and dequervain's injections 01/2009) ? glaucoma (h/o peripheral iridectomy), ? small cataracts Depression, ?bipolar PMH-FH-SH reviewed for relevance  Physical  Exam  General:  alert and well-developed.   Lungs:  Normal respiratory effort, chest expands symmetrically. Lungs are clear to auscultation, no crackles or wheezes. Heart:  Normal rate and regular rhythm. S1 and S2 normal without gallop, murmur, click, rub or other extra sounds.  Abdomen:  Bowel sounds positive,abdomen soft and non-tender without masses, organomegaly or hernias noted. Extremities:  no c/c/e.  bilateral hands decreased ROM 2/2 pain, R lateral wrist tender, no swelling.  + small nodule as well   Impression & Recommendations:  Problem # 1:  DIABETES MELLITUS, TYPE II (ICD-250.00)  continue lantus, encouraging compliance with Maggie May individual class. Her updated medication list for this problem includes:    Adult Aspirin Low Strength 81 Mg Tbdp (Aspirin) ..... One tab by mouth qday    Lisinopril-hydrochlorothiazide 20-12.5 Mg Tabs (Lisinopril-hydrochlorothiazide) .[name]... Take one daily    Lantus 100 Unit/ml Soln (Insulin glargine) .Marland KitchenMarland KitchenMarland KitchenMarland Kitchen 35 units q pm  Labs Reviewed: Creat: 0.64 (08/01/2008)   Microalbumin: 1+ (08/01/2008)  Last Eye Exam: diabetic retinopathy (07/03/2009) Reviewed HgBA1c results: 8.3 (07/18/2009)  9.1 (04/27/2009)  Orders: [place]- Est  Level 4 [id number])  Problem # 2:  HYPERTENSION, BENIGN ESSENTIAL, LABILE (ICD-401.1)  poor control.  advised to fill combo pill.  rtc 1 mo for f/u, will likely need addition of other agent for optimal control.  consider norvasc.  Her updated medication list for this problem includes:    Lisinopril-hydrochlorothiazide 20-12.5 Mg Tabs (Lisinopril-hydrochlorothiazide) .[name]... Take one daily  BP today: 195/99 Prior BP: 172/95 (07/18/2009)  Labs Reviewed: K+: 4.0 (08/01/2008) Creat: : 0.64 (08/01/2008)   Chol: 273 (08/01/2008)   HDL: 58 (08/01/2008)   LDL: 173 (08/01/2008)   TG: 208 (08/01/2008)  Orders: [place]- Est  Level 4 [id number])  Problem # 3:  DEPRESSIVE DISORDER, NOS (ICD-311)  Discussed treatment options, including  trial of antidpressant medication.  contracts for safety.  no evidence of bipolar on questioning today, currently on lyrica.  start celexa.  discussed how antidepressant takes 2-4 weeks to take effect, and to watch out for suicidality.  Orders: [place]- Est  Level 4 [id number])  Problem # 4:  GASTROPARESIS (ICD-536.3) on reglan, states helping.  would like refill of protonix sent to [facility].  Problem # 5:  FIBROMYALGIA (ICD-729.1) provided with handout on fibromyalgia to read and will further discuss next visit. Her updated medication list for this problem includes:    Adult Aspirin Low Strength 81 Mg Tbdp (Aspirin) ..... One tab by mouth qday    Tramadol Hcl 50 Mg Tabs (Tramadol hcl) .[name]... Take half a pill nightly    Methocarbamol 500 Mg Tabs (Methocarbamol) .[name]... Take three times daily  as needed for muscle spasm  Complete Medication List: 1)  Adult Aspirin Low Strength 81 Mg Tbdp (Aspirin) .... One tab by mouth qday 2)  Lisinopril-hydrochlorothiazide 20-12.5 Mg Tabs (Lisinopril-hydrochlorothiazide) .... Take one daily 3)  Lantus 100 Unit/ml Soln (Insulin glargine) .... 35 units q pm 4)  Pravastatin Sodium 40 Mg Tabs (Pravastatin sodium) .... Take one by mouth daily 5)  Lyrica 75 Mg Caps (Pregabalin) .... Two times a day 6)  Miralax Powd (Polyethylene glycol 3350) .Marland KitchenMarland Kitchen. 17 gm in 8 oz fluid po daily until regular then as needed for constipation 7)  Ranitidine Hcl 150 Mg Caps (Ranitidine hcl) .... One by mouth two times a day 8)  Nitrostat 0.3 Mg Subl (Nitroglycerin) .... Take under tongue as needed for chest pain, may take up to 3 times every five minutes. 9)  Tramadol Hcl 50 Mg Tabs (Tramadol hcl) .... Take half a pill nightly 10)  Reglan 10 Mg Tabs (Metoclopramide hcl) .... Take one by mouth 10 minutes prior to meals. 11)  Methocarbamol 500 Mg Tabs (Methocarbamol) .... Take three times daily as needed for muscle spasm 12)  Pantoprazole Sodium 40 Mg Tbec (Pantoprazole sodium) ....  One daily  Patient Instructions: 1)  Please return in 3-4 weeks for follow up. 2)  start combo pill for blood pressure medicine. 3)  I've sent protonix to [facility].  Ask them about lyrica and lantus options. 4)  we have started an antidepressant called celexa.  take daily - it will not go into effect until about 2-4 weeks into treatment. 5)  you've signed a release of information today for [facility].  read up on the information on fibromyalgia. 6)  Pleasure to see you today. Prescriptions: PANTOPRAZOLE SODIUM 40 MG TBEC (PANTOPRAZOLE SODIUM) one daily  #32 x 3   Entered and Authorized by:   Ria Bush  MD   Signed by:   Ria Bush  MD on 08/06/2009   Method used:   Faxed to ...       [facility] (retail)       [address] [place], [place]  [address]       Ph: [phone]       Fax: [phone]   RxID:   [id number]

## 2010-07-16 NOTE — Miscellaneous (Signed)
Summary: PT D/C   Clinical Lists Changes     D/C from [facility] OP PT, goals partially met.  pt pleased with current functional level.  completed 12 visits.  # [id number] Ria Bush  MD  August 16, 2009 12:20 PM

## 2010-07-16 NOTE — Assessment & Plan Note (Signed)
Summary: CORT SHOT/WRIST,MC   Primary Care Provider:  Ria Bush  MD   History of Present Illness: 55 y.o AAF here for F/U of right wrist pain.  Pt states pain is constant achey pain that prevents her from doing daily activities with that hand.  Pain is over radial side  wrist.  Pt had steroid injection in early September which helped a lot. has tried to put off a second injetion as long as possible but is not at point where sh cannot do ADls.  denies new injry. no numbness in fingers.  Allergies: 1)  Ibuprofen  Physical Exam  Additional Exam:  r wrist has from. + finklesteins test. tender over extensors of thumb. hand and fingers are neurovascularly intact.  Patient given informed consent for injection. Discussed possible complications of infection, bleeding or skin atrophy at site of injection. Possible side effect of avascular necrosis (focal area of bone death) due to steroid use.Appropriate verbal time out taken Are cleaned and prepped in usual sterile fashion. A -1/2--- cc kennalog plus --1/2--cc 1% lidocaine without epinephrine was injected into the---tendon sheath of right thumb extensor. Patient tolerated procedure well with no complications.    Impression & Recommendations:  Problem # 1:  DE QUERVAIN'S TENOSYNOVITIS (ICD-727.04)  Orders: Joint Aspirate / Injection, Intermediate [id number]) Kenalog 10 mg inj [id number]) She has done well with injection and  she is really tryiong to space ythem out. Injectin today. The thumb splint seemd to worsen her pain. rtc prn  Complete Medication List: 1)  Adult Aspirin Low Strength 81 Mg Tbdp (Aspirin) .... One tab by mouth qday 2)  Lisinopril-hydrochlorothiazide 20-12.5 Mg Tabs (Lisinopril-hydrochlorothiazide) .... Take one daily 3)  Lantus 100 Unit/ml Soln (Insulin glargine) .... 35 units q pm 4)  Pravastatin Sodium 40 Mg Tabs (Pravastatin sodium) .... Take one by mouth daily 5)  Lyrica 75 Mg Caps (Pregabalin) .... Two times a  day 6)  Miralax Powd (Polyethylene glycol 3350) .Marland KitchenMarland Kitchen. 17 gm in 8 oz fluid po daily until regular then as needed for constipation 7)  Ranitidine Hcl 150 Mg Caps (Ranitidine hcl) .... One by mouth two times a day 8)  Nitrostat 0.3 Mg Subl (Nitroglycerin) .... Take under tongue as needed for chest pain, may take up to 3 times every five minutes. 9)  Tramadol Hcl 50 Mg Tabs (Tramadol hcl) .... Take half a pill nightly 10)  Reglan 10 Mg Tabs (Metoclopramide hcl) .... Take one by mouth 10 minutes prior to meals. 11)  Methocarbamol 500 Mg Tabs (Methocarbamol) .... Take three times daily as needed for muscle spasm 12)  Pantoprazole Sodium 40 Mg Tbec (Pantoprazole sodium) .... One daily

## 2010-07-16 NOTE — Assessment & Plan Note (Signed)
Summary: out of insulin,df   Vital Signs:  Patient profile:   55 year old female Height:      62 inches Weight:      142.4 pounds BMI:     26.14 Temp:     98.7 degrees F oral Pulse rate:   106 / minute BP sitting:   177 / 101  (left arm) Cuff size:   regular  Vitals Entered By: Isabelle Course (Nov 05, 2009 2:31 PM)  Serial Vital Signs/Assessments:  Time      Position  BP       Pulse  Resp  Temp     By 2:50 PM             156/96                         Isabelle Course  Comments: 2:50 PM re checked manually By: Isabelle Course   CC: C/O out of insulin since Friday and has not taken HTN meds since Friday Is Patient Diabetic? Yes Did you bring your meter with you today? No   Primary Care Provider:  Ria Bush  MD  CC:  C/O out of insulin since Friday and has not taken HTN meds since Friday.  History of Present Illness: Patient here because she cannot afford her Lantus, she makes too much money to qualify for MAP yet cannot afford healh insurance or the purchase of Lantus.  Patient of Danise Mina with last A1C improved.  Has used oral agents in the past but not for over 5 years.    Says that she did not take her BP meds today.  She is able to afford them at [facility].  Reviewed meds for generics and changed PPI to omeprazole.  Habits & Providers  Alcohol-Tobacco-Diet     Tobacco Status: current     Tobacco Counseling: to quit use of tobacco products     Cigarette Packs/Day: 0.5  Current Medications (verified): 1)  Adult Aspirin Low Strength 81 Mg  Tbdp (Aspirin) .... One Tab By Mouth Qday 2)  Triamterene-Hctz 37.5-25 Mg Tabs (Triamterene-Hctz) .... Take One Daily For Blood Pressure 3)  Enalapril Maleate 10 Mg Tabs (Enalapril Maleate) .... Take One By Mouth Daily 4)  Lantus 100 Unit/ml Soln (Insulin Glargine) .... 35 Units Q Pm, Qs 1 Month 5)  Pravastatin Sodium 40 Mg Tabs (Pravastatin Sodium) .... Take One By Mouth Daily 6)  Lyrica 75 Mg  Caps (Pregabalin) .... Two Times  A Day 7)  Miralax  Powd (Polyethylene Glycol 3350) .Marland KitchenMarland KitchenMarland Kitchen 17 Gm in 8 Oz Fluid Po Daily Until Regular Then As Needed For Constipation 8)  Ranitidine Hcl 150 Mg Caps (Ranitidine Hcl) .... One By Mouth Two Times A Day 9)  Nitrostat 0.3 Mg Subl (Nitroglycerin) .... Take Under Tongue As Needed For Chest Pain, May Take Up To 3 Times Every Five Minutes. 10)  Tramadol Hcl 50 Mg Tabs (Tramadol Hcl) .... Take Half A Pill Nightly 11)  Reglan 10 Mg Tabs (Metoclopramide Hcl) .... Take One By Mouth 10 Minutes Prior To Meals. 12)  Methocarbamol 500 Mg Tabs (Methocarbamol) .... Take Three Times Daily As Needed For Muscle Spasm 13)  Citalopram Hydrobromide 20 Mg Tabs (Citalopram Hydrobromide) .... Take One Daily For Depression 14)  Omeprazole 20 Mg Cpdr (Omeprazole) .... One Daily  Allergies (verified): 1)  Ibuprofen  Social History: Packs/Day:  0.5  Physical Exam  General:  Well-developed,well-nourished,in no acute distress; alert,appropriate and cooperative throughout  examination   Impression & Recommendations:  Problem # 1:  DIABETES MELLITUS, TYPE II (ICD-250.00) Refered to Dr. Valentina Lucks for lower cost alternative to Lantus; considered using 70/30 but was not confortable making the conversion wihout pharmacy input.   Her updated medication list for this problem includes:    Adult Aspirin Low Strength 81 Mg Tbdp (Aspirin) ..... One tab by mouth qday    Enalapril Maleate 10 Mg Tabs (Enalapril maleate) .[name]... Take one by mouth daily    Lantus 100 Unit/ml Soln (Insulin glargine) .Marland KitchenMarland KitchenMarland KitchenMarland Kitchen 35 units q pm, qs 1 month  Orders: A1C-[facility] [id number]) [place]- Est Level  3 [id number])  Problem # 2:  HYPERTENSION, BENIGN ESSENTIAL, LABILE (ICD-401.1)  Very high today, did not take her BP meds today, but there may be more to the story. Her updated medication list for this problem includes:    Triamterene-hctz 37.5-25 Mg Tabs (Triamterene-hctz) .[name]... Take one daily for blood pressure    Enalapril Maleate 10 Mg Tabs (Enalapril  maleate) .[name]... 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 One tab by mouth qday    Enalapril Maleate 10 Mg Tabs (Enalapril maleate) .[name]... Take one by mouth daily    Lantus 100 Unit/ml Soln (Insulin glargine) .Marland KitchenMarland KitchenMarland KitchenMarland Kitchen 35 units q pm, qs 1 month  Orders: [place]- Est Level  3 [id number])  Problem # 4:  GASTROESOPHAGEAL REFLUX, NO ESOPHAGITIS (ICD-530.81)  Switched to a less expensive PPI The following medications were removed from the medication list:    Pantoprazole Sodium 40 Mg Tbec (Pantoprazole sodium) ..... One daily Her updated medication list for this problem includes:    Ranitidine Hcl 150 Mg Caps (Ranitidine hcl) ..... One by mouth two times a day    Omeprazole 20 Mg Cpdr (Omeprazole) ..... One daily  Orders: [place]- Est Level  3 [id number])  Complete Medication List: 1)  Adult Aspirin Low Strength 81 Mg Tbdp (Aspirin) .... One tab by mouth qday 2)  Triamterene-hctz 37.5-25 Mg Tabs (Triamterene-hctz) .... Take one daily for blood pressure 3)  Enalapril Maleate 10 Mg Tabs (Enalapril maleate) .... Take one by mouth daily 4)  Lantus 100 Unit/ml Soln (Insulin glargine) .... 35 units q pm, qs 1 month 5)  Pravastatin Sodium 40 Mg Tabs (Pravastatin sodium) .... Take one by mouth daily 6)  Miralax Powd (Polyethylene glycol 3350) .Marland KitchenMarland Kitchen. 17 gm in 8 oz fluid po daily until regular then as needed for constipation 7)  Ranitidine Hcl 150 Mg Caps (Ranitidine hcl) .... One by mouth two times a day 8)  Nitrostat 0.3 Mg Subl (Nitroglycerin) .... Take under tongue as needed for chest pain, may take up to 3 times every five minutes. 9)  Tramadol Hcl 50 Mg Tabs (Tramadol hcl) .... Take half a pill nightly 10)  Reglan 10 Mg Tabs (Metoclopramide hcl) .... Take one by mouth 10 minutes prior  to meals. 11)  Citalopram Hydrobromide 20 Mg Tabs (Citalopram hydrobromide) .... Take one daily for depression 12)  Omeprazole 20 Mg Cpdr (Omeprazole) .... One daily  Patient Instructions: 1)  Please make apt with Dr. Valentina Lucks our pharmacist to look at lower cost options for Lantus insulin 2)  I have swtiched your acid reflux med to a generic 3)  Please take your BP meds, so  when you come in to see the pharmacist we know if they are effective. Prescriptions: OMEPRAZOLE 20 MG CPDR (OMEPRAZOLE) one daily  #30 x 6   Entered and Authorized by:   Tereasa Coop NP   Signed by:   Tereasa Coop NP on 11/05/2009   Method used:   Electronically to        [facility] (retail)       [address]       [place],   [address]       Ph: [phone]       Fax: [phone]   RxID:   [id number]   Laboratory Results   Blood Tests   Date/Time Received: Nov 05, 2009 2:37 PM  Date/Time Reported: Nov 05, 2009 2:52 PM   HGBA1C: 10.4%   (Normal Range: Non-Diabetic - 3-6%   Control Diabetic - 6-8%)  Comments: ...............test performed by......Marland KitchenBonnie A. Martinique, MLS (ASCP)cm

## 2010-07-16 NOTE — Assessment & Plan Note (Signed)
Summary: elevated cbgs//[name]   Vital Signs:  Patient profile:   55 year old female Height:      62 inches Weight:      135 pounds BMI:     24.78 Temp:     98.5 degrees F Pulse rate:   100 / minute BP sitting:   178 / 100  (left arm) Cuff size:   regular  Vitals Entered By: Elray Mcgregor RN CC: Elevated blood sugars Is Patient Diabetic? Yes Did you bring your meter with you today? No Pain Assessment Patient in pain? yes     Location: lower back Intensity: 7   Primary Care Provider:  Shanda Howells MD  CC:  Elevated blood sugars.  History of Present Illness: 65 YOF w/PMhx/o DM, HTN, HLD here for peristent elevated CBGs DM: Pt reports CBGs in 300s and low 400s for last 5-7 days w/ pt feeling fatigued. Scheduled for DM Pharm clinic week of 12/26/09 per pt, however was wrongly scheduled. Pt reports taking 10 units lantus daily, however no novolog coverage for meals as pt reports not eating. However pt does report snacking on crackers and peanuts-eating small meals w/ rice and chicken w/ intermittent coverage. Pt took previous dosage of lantus at 25 units this am when pt had reading of 360. Pt reports persistent fatigue, polyuria, polydypsia, intermittent blurriness. No CP, N/V, abd pain. Pt tolerating fluid intake. Most recent CBG 260 per pt.  HTN: Pt reports home SBPs 150s-170s, checking intermittently on weekly basis. Has not been taking lisinopril-HCTZ because she saw somehting that said drug wasnt for african americans. No CP, hemi-deficits, abd pain. Pt does report intermittent SOB in setting of tobacco abuse.   Habits & Providers  Alcohol-Tobacco-Diet     Tobacco Status: current     Tobacco Counseling: to quit use of tobacco products     Cigarette Packs/Day: 0.75     Pack years: 2008  Allergies: 1)  Ibuprofen  Physical Exam  General:  alert, in minimal distress.  Head:  normocephalic.   Eyes:  vision grossly intact-intermittent blurriness  Nose:  no external  deformity.   Mouth:  poor dentition and teeth missing.   Neck:  supple and full ROM.   Lungs:  CTAB  Heart:  RRR, no wheezes, rales, rhoncii Abdomen:  S/NT/ND+ bowel sounds Neurologic:  oriented x 3   Impression & Recommendations:  Problem # 1:  DIABETES MELLITUS, TYPE II (ICD-250.00) Poorly controlled DM of multifactorial origin. Plan to place pt back on previous dosing of lantus 30 units to be taken in am in addition to schedlued small meals. Also plan to obatin UA nd BMET to assess extent of hyperglycemia. Pt also instructed to check and record blood sugars three times a day for more accurate measurements. Stressed importance of medication adherence to adequately manage disease as pt reports intermittent compliance w/ DM and HTN regimen. Red flags discussed at length w/ pt and husband-as well as red flags for hypoglycemia. Pt instructed to followup in 1 week to reassess DM. Pt agreeable.  Her updated medication list for this problem includes:    Adult Aspirin Low Strength 81 Mg Tbdp (Aspirin) ..... One tab by mouth qday    Lantus 100 Unit/ml Soln (Insulin glargine) .[name]... Take 30 units daily in am    Lisinopril-hydrochlorothiazide 20-12.5 Mg Tabs (Lisinopril-hydrochlorothiazide) .[name]... Take 1 tab by mouth daily    Novolog 100 Unit/ml Soln (Insulin aspart) ..... 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 Asymptomatic w/ CP, Abd pain, neuro findings. Clrified w/ pt that lisinopril-HCTZ IS SAFE in african americans and comlpiance is necessary to help address BPs. Pt instructed to take daily as previously prescribed and to followup in 1 week. Current overall major concern for significant noncompliance in pt.  Will try to addres any medication issues or concerns at next visit.  Her updated medication list for this problem includes:    Lisinopril-hydrochlorothiazide 20-12.5 Mg Tabs (Lisinopril-hydrochlorothiazide) .[name]... Take 1 tab by mouth daily  Complete Medication List: 1)  Adult Aspirin Low Strength 81 Mg Tbdp (Aspirin) .... One tab by mouth qday 2)  Lantus 100 Unit/ml Soln (Insulin glargine) .... Take 30 units daily in am 3)  Miralax Powd (Polyethylene glycol 3350) .Marland KitchenMarland Kitchen. 17 gm in 8 oz fluid po daily until regular then as needed for constipation 4)  Ranitidine Hcl 150 Mg Caps (Ranitidine hcl) .... One by mouth two times a day 5)  Nitrostat 0.3 Mg Subl (Nitroglycerin) .... Take under tongue as needed for chest pain, may take up to 3 times every five minutes. 6)  Tramadol Hcl 50 Mg Tabs (Tramadol hcl) .... Take half a pill nightly 7)  Reglan 10 Mg Tabs (Metoclopramide hcl) .... Take one by mouth 10 minutes prior to meals. 8)  Citalopram Hydrobromide 20 Mg Tabs (Citalopram hydrobromide) .... Take one daily for depression 9)  Lisinopril-hydrochlorothiazide 20-12.5 Mg Tabs (Lisinopril-hydrochlorothiazide) .... Take 1 tab by mouth daily 10)  Novolog 100 Unit/ml Soln (Insulin aspart) .... Use as directed 5 units with a small meal, 10 units with large meal 11)  Bd Insulin Syringe Microfine 27g X 5/8" 1 Ml Misc (Insulin syringe-needle u-100) .... Use as directed 12)  Bd Pen Needle Short U/f 31g X 8 Mm Misc (Insulin pen needle) .... Use as directed 13)  Lyrica 75 Mg Caps (Pregabalin) .[name].. 1 two times a day 14)  Fluconazole 50 Mg Tabs (Fluconazole) .... Take 3 tabs once 15)  Lipitor 40 Mg Tabs (Atorvastatin calcium) .... Take 1 tablet daily  Patient Instructions: 1)  It was good seeing today 2)  Take 30 units of lantus every morning 3)  Schedule 3-6 small high protein/low carbohydate meals daily (size of palm) and cover w/ small coverage novolog 4)  take your blood sugars three time a day and record  them 5)  Make sure you drink at leat 64-128 ozs of water daily for at leat the next 7 days 6)  Schedlue an appt to see me in 1 week and come in w/ your blood sugars at that time.  7)  God Bless,  8)  Shanda Howells MD  Laboratory Results   Urine Tests  Date/Time Received: January 02, 2010 4:19 PM  Date/Time Reported: January 02, 2010 4:40 PM   Routine Urinalysis   Glucose: >=1000   (Normal Range: Negative) Ketone: negative   (Normal Range: Negative)    Comments: ...............test performed by......Marland KitchenBonnie A. Martinique, MLS (ASCP)cm

## 2010-07-16 NOTE — Progress Notes (Signed)
Summary: Returning call   Phone Note Call from Patient Call back at Home Phone [phone]   Caller: Patient Reason for Call: Talk to Doctor Summary of Call: pt is returning MD call, I read message to pt re: the switch on her medication, she said the doctor said something about her kidneys but she couldnt understand the message. would like call back Initial call taken by: Samara Snide,  December 24, 2009 3:27 PM  Follow-up for Phone Call        spoke with patient and gave her message from MD. she will come in tomorrow AM for baseline LFTs. after results are obtained will notify patient to start Lipitor if indicated. . Follow-up by: Marcell Barlow RN,  December 25, 2009 9:43 AM  Additional Follow-up for Phone Call Additional follow up Details #1::        spoke w/ patient over phone about clarification. Will be going to pharm clinic thursday, will get LFTs drawn after pharm clinic.   Shanda Howells MD December 25, 2009 12:55 PM

## 2010-07-16 NOTE — Letter (Signed)
Summary: Out of Work  [facility]  [address]   [place], [place] [address]   Phone: [phone]  Fax: [phone]    July 18, 2009   Employee:  RHONDI NOLD    To Whom It May Concern:   For Medical reasons, please excuse the above named employee from work for the following dates:  Start:  July 18, 2009   End:  July 18, 2009   If you need additional information, please feel free to contact our office.         Sincerely,    Ria Bush  MD

## 2010-07-16 NOTE — Assessment & Plan Note (Signed)
Summary: f/up,tcb   Vital Signs:  Patient profile:   55 year old female Height:      62 inches Weight:      134 pounds BMI:     24.60 Temp:     98.4 degrees F oral Pulse rate:   100 / minute BP sitting:   154 / 82  (right arm) Cuff size:   regular  Vitals Entered By: Schuyler Amor CMA (December 20, 2009 1:45 PM) CC: F/U Is Patient Diabetic? Yes   Primary Care Provider:  Shanda Howells MD  CC:  F/U.  History of Present Illness: 43 YOM w/ PMHx/o type II DM here for new Dr. visit. Pt recently transitioned care from Dr. Ria Bush.  DM: Pt reports blood sugars in 300s at home. Most recent HbA1C at 10.4 10/2009. Pt reports recent loss of insurance and inability to afford lantus. Pt using novolog meal coverage per Dr. Graylin Shiver recommendations (5 units w/ small meals, 10 units w/large meals.). Pt states that she eats only meal a day but snacks heavily on candy throughout the day. Pt does not cover for this. Pt reports intermittent polyuria, polydypsia. No CP, Abd pain. No blurry vision, chills, dizziness.   Vaginal itching: Pt reports 2-3 week hx/o vaginal itching and discomfort. Pt denies change in sexual partners, abd pain, hx/o PID, hx/o STDs, fever, or vaginal discharge. No dysuria. No menses s/p hysterectomy 1986. Pt states that she has had previous symptoms such as this whenever her blood sugars got too high. No alleviating, aggravating facotrs per pt.   Habits & Providers  Alcohol-Tobacco-Diet     Tobacco Status: current     Tobacco Counseling: to quit use of tobacco products     Cigarette Packs/Day: 0.75  Allergies: 1)  Ibuprofen  Social History: Packs/Day:  0.75  Review of Systems       ROS negative except as noted above in HPI.   Physical Exam  General:  alert and normal appearance.   Head:  normocephalic and atraumatic.   Eyes:  vision grossly intact.   Nose:  no external deformity.   Mouth:  good dentition.   Neck:  supple and full ROM.   Lungs:  normal  respiratory effort, no dullness, no crackles, and no wheezes.   Heart:  normal rate, regular rhythm, and no murmur.   Abdomen:  soft, non-tender, normal bowel sounds, and no guarding.   Genitalia:  normal introitus, no external lesions, no vaginal or cervical lesions, and vaginal discharge.   Extremities:  no edema Neurologic:  alert & oriented X3.     Impression & Recommendations:  Problem # 1:  VAGINAL PRURITUS (ICD-698.1)  Likely candidal vulvovaginitis likely secondary to poorly controlled blood sugars. Also plan to obtain wet prep to rule out STD. Will provide Rx pending results.  Orders: Wet Prep- [place] [id number]) [place]- Est  Level 4 [id number])  Problem # 2:  DIABETES MELLITUS, TYPE II (ICD-250.00) Sample of lantus given to patient w/ plan for pt to increase lantus to 10 units nightly given persistet blood sugars in 300s in addition to scheduled meal coverage. Pt strongly advised to discontinue candy eating as this is likely major source of persistent hyperglycemia. Pt w/ tentative plan to followup w/ Dr. Valentina Lucks in next week for diabetic/Rx clinic.   Her updated medication list for this problem includes:    Adult Aspirin Low Strength 81 Mg Tbdp (Aspirin) ..... One tab by mouth qday    Lantus 100 Unit/ml Soln (Insulin  glargine) .[name]... Take 5 units at bedtime    Lisinopril-hydrochlorothiazide 20-12.5 Mg Tabs (Lisinopril-hydrochlorothiazide) .[name]... Take 1 tab by mouth daily    Novolog 100 Unit/ml Soln (Insulin aspart) ..... Use as directed 5 units with a small meal, 10 units with large meal  Problem # 3:  Screening Breast Cancer (ICD-V76.10) Pt tentatively scheduled for mammogram. Will followup results  Problem # 4:  HYPERLIPIDEMIA (ICD-272.4) Currently stable on pravachol. Will obtain FLP to reassess hyperlipidemia.  Her updated medication list for this problem includes:    Pravastatin Sodium 40 Mg Tabs (Pravastatin sodium) .[name]... Take one by mouth daily  Orders: T-Lipid Profile  [id number]) [facility]- Est  Level 4 ([address]214)Future Orders: T-Lipid Profile [id number]) ... 01/10/2010  Problem # 5:  TOBACCO USE (ICD-305.1) Counseled on smoking cessation. Pt actively trying to reduced number of cigarettes. Planning to attend smoking cessation class per pt.   Complete Medication List: 1)  Adult Aspirin Low Strength 81 Mg Tbdp (Aspirin) .... One tab by mouth qday 2)  Lantus 100 Unit/ml Soln (Insulin glargine) .... Take 5 units at bedtime 3)  Pravastatin Sodium 40 Mg Tabs (Pravastatin sodium) .... Take one by mouth daily 4)  Miralax Powd (Polyethylene glycol 3350) .Marland KitchenMarland Kitchen. 17 gm in 8 oz fluid po daily until regular then as needed for constipation 5)  Ranitidine Hcl 150 Mg Caps (Ranitidine hcl) .... One by mouth two times a day 6)  Nitrostat 0.3 Mg Subl (Nitroglycerin) .... Take under tongue as needed for chest pain, may take up to 3 times every five minutes. 7)  Tramadol Hcl 50 Mg Tabs (Tramadol hcl) .... Take half a pill nightly 8)  Reglan 10 Mg Tabs (Metoclopramide hcl) .... Take one by mouth 10 minutes prior to meals. 9)  Citalopram Hydrobromide 20 Mg Tabs (Citalopram hydrobromide) .... Take one daily for depression 10)  Lisinopril-hydrochlorothiazide 20-12.5 Mg Tabs (Lisinopril-hydrochlorothiazide) .... Take 1 tab by mouth daily 11)  Novolog 100 Unit/ml Soln (Insulin aspart) .... Use as directed 5 units with a small meal, 10 units with large meal 12)  Bd Insulin Syringe Microfine 27g X 5/8" 1 Ml Misc (Insulin syringe-needle u-100) .... Use as directed 13)  Bd Pen Needle Short U/f 31g X 8 Mm Misc (Insulin pen needle) .... Use as directed 14)  Lyrica 75 Mg Caps (Pregabalin) .[name].. 1 two times a day 15)  Fluconazole 50 Mg Tabs (Fluconazole) .... Take 3 tabs once 16)  Metronidazole 500 Mg Tabs (Metronidazole) .... 4 tabs once.  Other Orders: Mammogram (Screening) (Mammo) T-Basic Metabolic Panel ([id number]) Prescriptions: METRONIDAZOLE 500 MG TABS (METRONIDAZOLE) 4 tabs once.  #4  x 0   Entered and Authorized by:   Shanda Howells MD   Signed by:   Shanda Howells MD on 12/20/2009   Method used:   Electronically to        [facility] (retail)       [address]       [place], [place]  [address]       Ph: [phone]       Fax: [phone]   RxID:   [id number] FLUCONAZOLE 50 MG TABS (FLUCONAZOLE) Take 3 tabs once  #3 x 0   Entered and Authorized by:   Shanda Howells MD   Signed by:   Shanda Howells MD on 12/20/2009   Method used:   Electronically to        [facility] (retail)  [place], [place]  [address]       Ph: [phone]       Fax: [phone]   RxID:   [id number]    Prevention & Chronic Care Immunizations   Influenza vaccine: Fluvax Non-MCR  (03/22/2009)   Influenza vaccine due: 04/19/2009    Tetanus booster: 09/19/2003: Done.   Tetanus booster due: 09/18/2013    Pneumococcal vaccine: Not documented  Colorectal Screening   Hemoccult: Not documented    Colonoscopy: Not documented   Colonoscopy due: Refused  (08/01/2008)  Other Screening   Pap smear: Not documented   Pap smear due: Not Indicated    Mammogram: Not documented   Mammogram action/deferral: Ordered  (12/20/2009)   Smoking status: current  (12/20/2009)   Smoking cessation counseling: yes  (04/19/2008)   Target quit date: 06/23/2007  (05/25/2007)  Diabetes Mellitus   HgbA1C: 10.4  (11/05/2009)   Hemoglobin A1C due: 05/17/2008    Eye exam: diabetic retinopathy  (07/03/2009)   Diabetic eye exam action/deferral: Ophthalmology referral  (03/06/2009)   Eye exam due: 07/2010    Foot exam: yes  (08/29/2009)   Foot exam action/deferral: Do today   High risk foot: Not documented   Foot care education: Not documented    Urine microalbumin/creatinine ratio: Not documented    Diabetes flowsheet reviewed?: Yes   Progress toward A1C goal: Unchanged  Lipids   Total Cholesterol: 273   (08/01/2008)   Lipid panel action/deferral: Lipid Panel ordered   LDL: 173  (08/01/2008)   LDL Direct: Not documented   HDL: 58  (08/01/2008)   Triglycerides: 208  (08/01/2008)    SGOT (AST): 16  (08/01/2008)   SGPT (ALT): 23  (08/01/2008)   Alkaline phosphatase: 147  (08/01/2008)   Total bilirubin: 0.5  (08/01/2008)    Lipid flowsheet reviewed?: Yes   Progress toward LDL goal: Unchanged  Hypertension   Last Blood Pressure: 154 / 82  (12/20/2009)   Serum creatinine: 0.64  (08/01/2008)   BMP action: Ordered   Serum potassium 4.0  (08/01/2008)    Hypertension flowsheet reviewed?: Yes   Progress toward BP goal: Improved  Self-Management Support :   Personal Goals (by the next clinic visit) :     Personal A1C goal: 8  (07/18/2009)     Personal blood pressure goal: 140/90  (02/14/2009)     Personal LDL goal: 100  (02/14/2009)    Diabetes self-management support: Written self-care plan  (12/20/2009)   Diabetes care plan printed    Hypertension self-management support: Written self-care plan  (12/20/2009)   Hypertension self-care plan printed.    Lipid self-management support: Written self-care plan  (12/20/2009)   Lipid self-care plan printed.   Nursing Instructions: Schedule screening mammogram (see order)      Flowsheet View for Follow-up Visit    Weight:     134    Blood pressure:   154 / 82    Smoking PPD:   0.75   Laboratory Results  Date/Time Received: December 20, 2009 1:53 PM  Date/Time Reported: December 20, 2009 2:42 PM   [employer] Source: vaginal WBC/hpf: >20 Bacteria/hpf: 3+  Rods Clue cells/hpf: none  Negative whiff Yeast/hpf: few Trichomonas/hpf: few Comments: ...........test performed by...........Marland KitchenHedy Camara, CMA

## 2010-07-16 NOTE — Progress Notes (Signed)
Summary: triage   Phone Note Call from Patient Call back at Home Phone [phone]   Caller: Patient Summary of Call: Wants to talk to Gay Filler about some papers concerning some medicine she was supposed to get. Initial call taken by: Raymond Gurney,  January 07, 2010 10:02 AM  Follow-up for Phone Call        states some forms were faxed here for md to sign. it may be in her maiden names, Amedeo Plenty. none in pcp chart box. message to pcp to see if he has it. told pt I will check thru the faxes that are here this am & will call her when it is done & ready to fax Follow-up by: Elige Radon RN,  January 07, 2010 10:53 AM  Additional Follow-up for Phone Call Additional follow up Details #1::        no forms found. LM for her to call me. we will need more info about these forms Additional Follow-up by: Elige Radon RN,  January 08, 2010 9:24 AM    Additional Follow-up for Phone Call Additional follow up Details #2::    states the forms were faxed here from a drug company for free drugs. told her I will send a text to md to see if he has them Follow-up by: Elige Radon RN,  January 08, 2010 9:40 AM  Additional Follow-up for Phone Call Additional follow up Details #3:: Details for Additional Follow-up Action Taken: Discussed w/ Gay Filler that I have not recieved any forms pertaining to Thi Blee to date.  Shanda Howells MD January 08, 2010 12:20 PM

## 2010-07-18 NOTE — Assessment & Plan Note (Signed)
Summary: F/U  PER Nikol Lemar/KH   Vital Signs:  Patient profile:   55 year old female Height:      62 inches Weight:      144.4 pounds BMI:     26.51 Temp:     98.4 degrees F oral Pulse rate:   93 / minute BP sitting:   149 / 83  (right arm) Cuff size:   regular  Vitals Entered By: Enid Skeens, CMA (June 12, 2010 2:07 PM) CC: follow up, Depression, Hypertension Management Is Patient Diabetic? Yes Did you bring your meter with you today? No   CC:  follow up, Depression, and Hypertension Management.  Depression History:      Positive alarm features for depression include insomnia and impaired concentration (indecisiveness).  However, she denies psychomotor agitation, feelings of worthlessness (guilt), and recurrent thoughts of death or suicide.  The patient denies symptoms of a manic disorder including persistently & abnormally elevated mood, less need for sleep, and flight of ideas.        The patient denies that she feels like life is not worth living, denies that she wishes that she were dead, and denies that she has thought about ending her life.        Comments:  Mood is somewhat better since last clinical visit per pt/per husband. Has been compliant with tegretol, but stopped taking 1-2 days ago because medication gave her significant abdominal pain.  Took tegretol for approx 10 days per pt. .  Depression Treatment History:  Prior Medication Used:   Start Date: Assessment of Effect:   Comments:  Celexa (citalopram)     08/2009   no improvement/noncompliant       --  Hypertension History:      She complains of headache, palpitations, and dyspnea with exertion, but denies chest pain, peripheral edema, and syncope.        Positive major cardiovascular risk factors include female age 61 years old or older, diabetes, hyperlipidemia, hypertension, and current tobacco user.        Positive history for target organ damage include cardiac end organ damage (either CHF or LVH), peripheral  vascular disease, and hypertensive retinopathy.  Further assessment for target organ damage reveals no history of stroke/TIA or renal insufficiency.      Habits & Providers  Alcohol-Tobacco-Diet     Tobacco Status: current  Current Medications (verified): 1)  Adult Aspirin Low Strength 81 Mg  Tbdp (Aspirin) .... One Tab By Mouth Qday 2)  Lantus 100 Unit/ml Soln (Insulin Glargine) .... Take 40 Units Daily in Am 3)  Miralax  Powd (Polyethylene Glycol 3350) .Marland KitchenMarland KitchenMarland Kitchen 17 Gm in 8 Oz Fluid Po Daily Until Regular Then As Needed For Constipation 4)  Ranitidine Hcl 150 Mg Caps (Ranitidine Hcl) .... One By Mouth Two Times A Day 5)  Nitrostat 0.3 Mg Subl (Nitroglycerin) .... Take Under Tongue As Needed For Chest Pain, May Take Up To 3 Times Every Five Minutes. 6)  Bd Insulin Syringe Microfine 27g X 5/8" 1 Ml Misc (Insulin Syringe-Needle U-100) .... Use As Directed 7)  Bd Pen Needle Short U/f 31g X 8 Mm Misc (Insulin Pen Needle) .... Use As Directed 8)  Lisinopril-Hydrochlorothiazide 20-12.5 Mg Tabs (Lisinopril-Hydrochlorothiazide) .... 2 Pills Daily 9)  Lopressor 50 Mg Tabs (Metoprolol Tartrate) .[name].. 1 Tablet Daily 10)  Reglan 10 Mg Tabs (Metoclopramide Hcl) .[name].. 1 Tab By Mouth 10 Mins Prior To Meals 11)  Lamictal 25 Mg Tabs (Lamotrigine) .[name].. 1 Tab Daily X14 Days,  Then 2 Tabs Daily X 14 Days, Then 4 Tabs Daily X 1 Week  Allergies: 1)  Ibuprofen  Physical Exam  General:  alert.  NAD Eyes:  vision grossly intact.   Mouth:  teeth missing  Neck:  supple and full ROM.   Lungs:  CTAB, no wheezes, rales, rhoncii Heart:  RRR, no rubs, gallops, murmurs  Abdomen:  S/NT/ND Extremities:  2+ peripheral pulses, no edema   Impression & Recommendations:  Problem # 1:  BIPOLAR II DISORDER (ICD-296.89) Overall mood somewhat improved since last clinical visit. Will transition to lamictal as pt reports significant abdominal pain with taking tegretol.  Will also check CMET to rule out any hepatotoxic effects of  tegretol.  Will reassess mood in 1 week. Would like  to restart SSRI in next 1-2 months once stable on mood stabilizer.   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 Pt with long standing non specific sxs. Likely secondary to untreated fibromylagia and polyneuropathy.  Pt noted to be at increased vascular risk given comorbidities; though no acute issues today. Plan to increase lopressor to 50mg  daily as HR noted in 90s. Will check K/Cr.  Her updated medication list for this problem includes:    Lisinopril-hydrochlorothiazide 20-12.5 Mg Tabs (Lisinopril-hydrochlorothiazide) .[name]... 2 pills daily    Lopressor 50 Mg Tabs (Metoprolol tartrate) .[name]... 1 tablet daily  Orders: [place]- Est  Level 4 [id number])  Problem # 3:  Preventive Health Care (ICD-V70.0) Mammogram ordered.   Complete Medication List: 1)  Adult Aspirin Low Strength 81 Mg Tbdp (Aspirin) .... One tab by mouth qday 2)  Lantus 100 Unit/ml Soln (Insulin glargine) .... Take 40 units daily in am 3)  Miralax Powd (Polyethylene glycol 3350) .Marland KitchenMarland Kitchen. 17 gm in 8 oz fluid po daily until regular then as needed for constipation 4)  Ranitidine Hcl 150 Mg Caps (Ranitidine hcl) .... One by mouth two times a day 5)  Nitrostat 0.3 Mg Subl (Nitroglycerin) .... Take under tongue as needed for chest pain, may take up to 3 times every five minutes. 6)  Bd Insulin Syringe Microfine 27g X 5/8" 1 Ml Misc (Insulin syringe-needle u-100) .... Use as directed 7)  Bd Pen Needle Short U/f 31g X 8 Mm Misc (Insulin pen needle) .... Use as directed 8)  Lisinopril-hydrochlorothiazide 20-12.5 Mg Tabs (Lisinopril-hydrochlorothiazide) .... 2 pills daily 9)  Lopressor 50 Mg Tabs (Metoprolol tartrate) .[name].. 1 tablet daily 10)  Reglan 10 Mg Tabs (Metoclopramide hcl) .[name].. 1 tab by mouth 10 mins prior to meals 11)  Lamictal 25 Mg Tabs (Lamotrigine) .[name].. 1 tab daily x14 days,  then 2 tabs daily x 14 days, then 4 tabs daily x 1 week  Other Orders: Comp [facility] [id number]) Mammogram (Screening) (Mammo)  Hypertension Assessment/Plan:      The patient's hypertensive risk group is category C: Target organ damage and/or diabetes.  Her calculated 10 year risk of coronary heart disease is > 32 %.  Today's blood pressure is 149/83.  Her blood pressure goal is < 130/80.  Patient Instructions: 1)  It was good to see you today 2)  I will start you on lamictal for your mood  3)  Take 1 pill of the metoprolol daily  4)  I will also check some labs today 5)  Please followup with the psychiatrist as previously instructed 6)  Come back to see me next week 7)  Otherwise call  with any questions. 8)  Happy New Year and God Bless 9)  Mercedes Howells MD  Prescriptions: Tonia Ghent 50 MG TABS (METOPROLOL TARTRATE) 1 tablet daily  #30 x 3   Entered and Authorized by:   Mercedes Howells MD   Signed by:   Mercedes Howells MD on 06/12/2010   Method used:   Electronically to        [facility] (retail)       [address]       [place], [place]  [address]       Ph: [phone]       Fax: [phone]   RxID:   [id number] LAMICTAL 25 MG TABS (LAMOTRIGINE) 1 tab daily x14 days, then 2 tabs daily x 14 days, then 4 tabs daily x 1 week  #70 x 0   Entered and Authorized by:   Mercedes Howells MD   Signed by:   Mercedes Howells MD on 06/12/2010   Method used:   Electronically to        [facility] (retail)       [address]       [place], [place]  [address]       Ph: [phone]       Fax: [phone]   RxID:   [id number]    Orders Added: 1)  Comp Met-[facility] [id number] 2)  Mammogram (Screening) [Mammo] 3)  [facility]- Est  Level 4 [id number]     Prevention & Chronic Care Immunizations   Influenza vaccine: Fluvax Non-MCR  (03/22/2009)   Influenza vaccine due: 04/19/2009    Tetanus booster: 09/19/2003: Done.   Tetanus  booster due: 09/18/2013    Pneumococcal vaccine: Not documented  Colorectal Screening   Hemoccult: Not documented    Colonoscopy: Not documented   Colonoscopy action/deferral: GI Referral  (02/19/2010)   Colonoscopy due: Refused  (08/01/2008)  Other Screening   Pap smear: Not documented   Pap smear due: Not Indicated    Mammogram: Not documented   Mammogram action/deferral: Ordered  (06/12/2010)   Smoking status: current  (06/12/2010)   Smoking cessation counseling: yes  (04/19/2008)   Target quit date: 06/23/2007  (05/25/2007)  Diabetes Mellitus   HgbA1C: 8.6  (04/23/2010)   Hemoglobin A1C due: 05/17/2008    Eye exam: diabetic retinopathy  (07/03/2009)   Diabetic eye exam action/deferral: Ophthalmology referral  (03/06/2009)   Eye exam due: 07/2010    Foot exam: yes  (01/17/2010)   Foot exam action/deferral: Do today   High risk foot: Not documented   Foot care education: Not documented    Urine microalbumin/creatinine ratio: Not documented   Urine microalbumin action/deferral: Ordered    Diabetes flowsheet reviewed?: Yes   Progress toward A1C goal: Improved  Lipids   Total Cholesterol: 255  (12/27/2009)   Lipid panel action/deferral: Lipid Panel ordered   LDL: 179  (12/27/2009)   LDL Direct: Not documented   HDL: 53  (12/27/2009)   Triglycerides: 113  (12/27/2009)    SGOT (AST): 9  (05/22/2010)   BMP action: Ordered   SGPT (ALT): 9  (05/22/2010) CMP ordered    Alkaline phosphatase: 110  (05/22/2010)   Total bilirubin: 0.5  (05/22/2010)    Lipid flowsheet reviewed?: Yes   Progress toward LDL goal: Unchanged  Hypertension   Last Blood Pressure: 149 / 83  (06/12/2010)   Serum creatinine: 0.64  (05/22/2010)  BMP action: Ordered   Serum potassium 3.6  (05/22/2010) CMP ordered   Self-Management Support :   Personal Goals (by the next clinic visit) :     Personal A1C goal: 8  (07/18/2009)     Personal blood pressure goal: 140/90  (02/14/2009)      Personal LDL goal: 100  (02/14/2009)    Diabetes self-management support: Written self-care plan  (02/19/2010)    Hypertension self-management support: Written self-care plan  (02/19/2010)    Lipid self-management support: Written self-care plan  (02/19/2010)    Nursing Instructions: Give Flu vaccine today Schedule screening mammogram (see order)

## 2010-07-18 NOTE — Assessment & Plan Note (Signed)
Summary: Chronic Problem Followup   Vital Signs:  Patient profile:   55 year old female Height:      62 inches Weight:      147 pounds BMI:     26.98 Pulse rate:   96 / minute BP sitting:   165 / 90  (right arm) Cuff size:   regular  Vitals Entered By: Schuyler Amor CMA (May 22, 2010 10:08 AM) CC: F/U, Hypertension Management, Depression Pain Assessment Patient in pain? yes     Location: body ache Intensity: 6   Primary Care Jareb Radoncic:  Shanda Howells MD  CC:  F/U, Hypertension Management, and Depression.  History of Present Illness: DM: Blood sugars now mainly in 140s w/ lantus 40 daily per pt. has stopped taking SSI becuase of hypoglycemia. No recent episodes of polyuria, polydypsia per pt. Vision somewhat improved.   L sided numbness:  11/8-->Patient has four days of a numb feeling on the left side of her face.  She is able to chew, speak, and move her face fine.  It feels like someone gave her an injection and it is wearing off.  She denies any other neurological deficits.  Today--> symptoms essentially unchanged. No focal neuro deficits. Was rxd neurontin in the past, however pt has not been taking.   Depression History:      The patient comes in today for her first follow up visit for depression.  The patient is having a depressed mood most of the day and has a diminished interest in her usual daily activities.  Positive alarm features for depression include insomnia and fatigue (loss of energy).  However, she denies significant weight loss and significant weight gain.  Positive alarm features for a manic disorder include abnormally & persistently irritable mood and distractibility.  She denies persistently & abnormally elevated mood, less need for sleep, flight of ideas, increase in goal-directed activity, and excessive sexual indiscretions.        Suicide risk questions reveal that she has thought about ending her life.  The patient denies that she feels like life is  not worth living, denies that she wishes that she were dead, and denies that she has planned how to end her life.        Comments:  Was previously prescribed tegretol and citalopram. However has not been taking because meds make pt feel "sleepy". Feels that mood primarily depressed and sometimes labile.  No current HI/SI; though has had in the remote past. .  Depression Treatment History:  Prior Medication Used:   Start Date: Assessment of Effect:   Comments:  Celexa (citalopram)     08/2009   no improvement/noncompliant       --  Hypertension History:      She complains of headache and orthopnea, but denies chest pain, palpitations, dyspnea with exertion, peripheral edema, and visual symptoms.  Further comments include: Pt has not taken lopressor today. Has been taking BP meds daily per pt. .        Positive major cardiovascular risk factors include female age 59 years old or older, diabetes, hyperlipidemia, hypertension, and current tobacco user.        Positive history for target organ damage include cardiac end organ damage (either CHF or LVH), peripheral vascular disease, and hypertensive retinopathy.  Further assessment for target organ damage reveals no history of stroke/TIA or renal insufficiency.       Allergies: 1)  Ibuprofen  Physical Exam  General:   alert. in min  distress, intermittently tearful   Nose:  no external deformity.   Neck:  supple and full ROM, no JVD Lungs:  CTAB, no wheezes, rales, rhoncii  Heart:  normal rate, regular rhythm, and no murmur.   Abdomen:  soft, non-tender, and normal bowel sounds.   Extremities:  2+ peripheral pulses, no edema  Neurologic:  alert & oriented X3.     Impression & Recommendations:  Problem # 1:  DEPRESSIVE DISORDER, NOS (ICD-311) Baseline hx/o severe noncompliance w/ contribution of depressive d/o. Stressed importance of compliance. Instructed pt that I will now be seeing her on a weekly basis. Will start with pt taking  tegretol as previously instructed for mood stabilization as well as formal psychiatry referral. Will then restart celexa as pt noncompliant in past with medication-will follow for improvement in sxs. Currently no current HI/SI. Discussed psych red flags at length with husband and pt. Will follow up in 1 week. Pt agreeable to plan.  The following medications were removed from the medication list:    Citalopram Hydrobromide 20 Mg Tabs (Citalopram hydrobromide) .[name]... Take one daily for depression  Orders: Psychiatric Referral (Psych) [place]- Est  Level 4 [id number])  Problem # 2:  BIPOLAR II DISORDER (ICD-296.89) See problem #1.  may also consider screening pt for drug use in the future, though pt denies.  Orders: Psychiatric Referral (Psych) [place]- Est  Level 4 [id number])  Problem # 3:  HYPERTENSION, BENIGN ESSENTIAL, LABILE (ICD-401.1) Will obtain EKG given palpitations. Pt instructed to come in for repeat BP check when all BP meds taken for accurate BP measurement. Pt agreeable to plan. Will also check Cr. and K.  Her updated medication list for this problem includes:    Lisinopril-hydrochlorothiazide 20-12.5 Mg Tabs (Lisinopril-hydrochlorothiazide) .[name]... 2 pills daily    Lopressor 50 Mg Tabs (Metoprolol tartrate) .[name]... 1/2 tablet daily  Orders: 12 Lead EKG (12 Lead EKG) [facility] [id number]) [place]- Est  Level 4 [id number])  Problem # 4:  DIABETIC PERIPHERAL NEUROPATHY (ICD-250.60)  Currently stable. Will need to resume neurontin. Will reassess at next clinic visit.  The following medications were removed from the medication list:    Novolog 100 Unit/ml Soln (Insulin aspart) .[name]... Pt is not taking use as directed 5 units with a small meal, 10 units with large meal Her updated medication list for this problem includes:    Adult Aspirin Low Strength 81 Mg Tbdp (Aspirin) ..... One tab by mouth qday    Lantus 100 Unit/ml Soln (Insulin glargine) .[name]... Take 40 units daily in am     Lisinopril-hydrochlorothiazide 20-12.5 Mg Tabs (Lisinopril-hydrochlorothiazide) .[name]... 2 pills daily  Orders: [place]- Est  Level 4 [id number])  Problem # 5:  DIABETES MELLITUS, TYPE II (ICD-250.00)  Wil continue w/ current regimen .  The following medications were removed from the medication list:    Novolog 100 Unit/ml Soln (Insulin aspart) .[name]... Pt is not taking use as directed 5 units with a small meal, 10 units with large meal Her updated medication list for this problem includes:    Adult Aspirin Low Strength 81 Mg Tbdp (Aspirin) ..... One tab by mouth qday    Lantus 100 Unit/ml Soln (Insulin glargine) .[name]... Take 40 units daily in am    Lisinopril-hydrochlorothiazide 20-12.5 Mg Tabs (Lisinopril-hydrochlorothiazide) .[name]... 2 pills daily  Orders: [facility]- Est  Level 4 [id number])  Complete Medication List: 1)  Adult Aspirin Low Strength 81 Mg Tbdp (Aspirin) .... One tab by mouth qday 2)  Lantus 100 Unit/ml Soln (Insulin glargine) .Marland KitchenMarland KitchenMarland Kitchen  Take 40 units daily in am 3)  Miralax Powd (Polyethylene glycol 3350) .Marland KitchenMarland Kitchen. 17 gm in 8 oz fluid po daily until regular then as needed for constipation 4)  Ranitidine Hcl 150 Mg Caps (Ranitidine hcl) .... One by mouth two times a day 5)  Nitrostat 0.3 Mg Subl (Nitroglycerin) .... Take under tongue as needed for chest pain, may take up to 3 times every five minutes. 6)  Bd Insulin Syringe Microfine 27g X 5/8" 1 Ml Misc (Insulin syringe-needle u-100) .... Use as directed 7)  Bd Pen Needle Short U/f 31g X 8 Mm Misc (Insulin pen needle) .... Use as directed 8)  Lisinopril-hydrochlorothiazide 20-12.5 Mg Tabs (Lisinopril-hydrochlorothiazide) .... 2 pills daily 9)  Lopressor 50 Mg Tabs (Metoprolol tartrate) .... 1/2 tablet daily 10)  Tegretol 200 Mg Tabs (Carbamazepine) .... 1/2 tablet twice daily 11)  Reglan 10 Mg Tabs (Metoclopramide hcl) .[name].. 1 tab by mouth 10 mins prior to meals  Hypertension Assessment/Plan:      The patient's hypertensive risk group is category C: Target  organ damage and/or diabetes.  Her calculated 10 year risk of coronary heart disease is > 32 %.  Today's blood pressure is 165/90.  Her blood pressure goal is < 130/80.  Patient Instructions: 1)  It was good to see you today 2)  Your medication list has been updated 3)  Please follow this list as directed 4)  Come back in tomorrow for a blood pressure check when you have taken your blood pressure medications 5)  We will check an EKG because of your history of palpitations 6)  If you develop any chest pain, give [place] a call, or go to the ED 7)  Come back to see me next week wednesday  8)  We will also check some lab work.  9)  If you have any particular questions, please give [place] a call.  10)  God Bless,  26)  Shanda Howells MD  Prescriptions: REGLAN 10 MG TABS (METOCLOPRAMIDE HCL) 1 tab by mouth 10 mins prior to meals  #90 x 3   Entered and Authorized by:   Shanda Howells MD   Signed by:   Shanda Howells MD on 05/22/2010   Method used:   Print then Give to Patient   RxID:   [id number] TEGRETOL 200 MG TABS (CARBAMAZEPINE) 1/2 tablet twice daily  #60 x 0   Entered and Authorized by:   Shanda Howells MD   Signed by:   Shanda Howells MD on 05/22/2010   Method used:   Print then Give to Patient   RxID:   [id number] LOPRESSOR 50 MG TABS (METOPROLOL TARTRATE) 1/2 tablet daily  #0 x 0   Entered and Authorized by:   Shanda Howells MD   Signed by:   Shanda Howells MD on 05/22/2010   Method used:   Print then Give to Patient   RxID:   [id number] LISINOPRIL-HYDROCHLOROTHIAZIDE 20-12.5 MG TABS (LISINOPRIL-HYDROCHLOROTHIAZIDE) 2 pills daily  #60 x 6   Entered and Authorized by:   Shanda Howells MD   Signed by:   Shanda Howells MD on 05/22/2010   Method used:   Print then Give to Patient   RxID:   [id number] REGLAN 10 MG TABS (METOCLOPRAMIDE HCL) 1 tab by mouth 10 mins prior to meals  #90 x 3   Entered and Authorized by:   Shanda Howells MD   Signed by:   Shanda Howells MD on  05/22/2010   Method used:   [facility]       [address]       [place], [place]  [address]       Ph: [phone]       Fax: [phone]   RxID:   [id number] TEGRETOL 200 MG TABS (CARBAMAZEPINE) 1/2 tablet twice daily  #60 x 0   Entered and Authorized by:   Shanda Howells MD   Signed by:   Shanda Howells MD on 05/22/2010   Method used:   Electronically to        [facility] (retail)       [address]       [place], [place]  [address]       Ph: [phone]       Fax: [phone]   RxID:   [id number] LISINOPRIL-HYDROCHLOROTHIAZIDE 20-12.5 MG TABS (LISINOPRIL-HYDROCHLOROTHIAZIDE) 2 pills daily  #60 x 6   Entered and Authorized by:   Shanda Howells MD   Signed by:   Shanda Howells MD on 05/22/2010   Method used:   Electronically to        [facility] (retail)       [address]       [place], [place]  [address]       Ph: [phone]       Fax: [phone]   RxID:   [id number]    Orders Added: 1)  12 Lead EKG [12 Lead EKG] 2)  Comp Met-[facility] [id number] 3)  Psychiatric Referral [Psych] 4)  [place]- Est  Level 4 [id number]       Prevention & Chronic Care Immunizations   Influenza vaccine: Fluvax Non-MCR  (03/22/2009)   Influenza vaccine due: 04/19/2009    Tetanus booster: 09/19/2003: Done.   Tetanus booster due: 09/18/2013    Pneumococcal vaccine: Not documented  Colorectal Screening   Hemoccult: Not documented    Colonoscopy: Not documented   Colonoscopy action/deferral: GI Referral  (02/19/2010)   Colonoscopy due: Refused  (08/01/2008)  Other Screening   Pap smear: Not documented   Pap smear due: Not Indicated    Mammogram: Not documented   Mammogram action/deferral: Ordered  (12/20/2009)   Smoking status: current  (04/23/2010)   Smoking cessation counseling: yes  (04/19/2008)   Target quit date: 06/23/2007   (05/25/2007)  Diabetes Mellitus   HgbA1C: 8.6  (04/23/2010)   Hemoglobin A1C due: 05/17/2008    Eye exam: diabetic retinopathy  (07/03/2009)   Diabetic eye exam action/deferral: Ophthalmology referral  (03/06/2009)   Eye exam due: 07/2010    Foot exam: yes  (01/17/2010)   Foot exam action/deferral: Do today   High risk foot: Not documented   Foot care education: Not documented    Urine microalbumin/creatinine ratio: Not documented   Urine microalbumin action/deferral: Ordered    Diabetes flowsheet reviewed?: Yes   Progress toward A1C goal: Deteriorated  Lipids   Total Cholesterol: 255  (12/27/2009)   Lipid panel action/deferral: Lipid Panel ordered   LDL: 179  (12/27/2009)   LDL Direct: Not documented   HDL: 53  (12/27/2009)   Triglycerides: 113  (12/27/2009)    SGOT (AST): 12  (02/19/2010)   BMP action: Ordered   SGPT (ALT): 10  (02/19/2010) CMP ordered    Alkaline phosphatase: 100  (02/19/2010)   Total bilirubin: 0.4  (02/19/2010)  Lipid flowsheet reviewed?: Yes   Progress toward LDL goal: Unchanged  Hypertension   Last Blood Pressure: 165 / 90  (05/22/2010)   Serum creatinine: 0.63  (02/19/2010)   BMP action: Ordered   Serum potassium 4.4  (02/19/2010) CMP ordered     Hypertension flowsheet reviewed?: Yes   Progress toward BP goal: Deteriorated  Self-Management Support :   Personal Goals (by the next clinic visit) :     Personal A1C goal: 8  (07/18/2009)     Personal blood pressure goal: 140/90  (02/14/2009)     Personal LDL goal: 100  (02/14/2009)    Diabetes self-management support: Written self-care plan  (02/19/2010)    Hypertension self-management support: Written self-care plan  (02/19/2010)    Lipid self-management support: Written self-care plan  (02/19/2010)    Nursing Instructions: Give Flu vaccine today   Appended Document: Chronic Problem Followup pharmacy calls advising MD did not put a quanity on [place] . RN advised to give #  15 tabs and 2 refills.

## 2010-07-18 NOTE — Miscellaneous (Signed)
Summary: PROBLEM LIST UPDATE   Clinical Lists Changes  Problems: Removed problem of CONSTIPATION (ICD-564.00) Removed problem of CHEST PAIN (ICD-786.50) Removed problem of HYPERTENSION, BENIGN ESSENTIAL, LABILE (ICD-401.1) Removed problem of NECK SPASM (ICD-847.0) Removed problem of DISTURBANCE OF SKIN SENSATION (ICD-782.0) Added new problem of HYPERTENSION, BENIGN ESSENTIAL (ICD-401.1) Pt with multiple recurrent medical problems. Problems removed that pt has not been seen about for the last 6 months. HTN dx modified from 2008.  Shanda Howells MD June 17, 2010 10:42 AM

## 2010-07-18 NOTE — Assessment & Plan Note (Signed)
Summary: F/U  F[place]   Vital Signs:  Patient profile:   55 year old female Height:      62 inches Weight:      150 pounds BMI:     27.53 Temp:     98.7 degrees F oral Pulse rate:   87 / minute BP sitting:   155 / 79  (left arm) Cuff size:   regular  Vitals Entered By: Schuyler Amor CMA (May 29, 2010 9:37 AM) CC: F/U, Depression, Hypertension Management   CC:  F/U, Depression, and Hypertension Management.  Depression History:      The patient comes in today for her first follow up visit for depression.  The patient is having a depressed mood most of the day and has a diminished interest in her usual daily activities.  The patient denies significant weight loss and significant weight gain.  Positive alarm features for a manic disorder include distractibility and flight of ideas.        Suicide risk questions reveal that she has thought about ending her life.  The patient denies that she feels like life is not worth living, denies that she wishes that she were dead, and denies that she has planned how to end her life.        Comments:  Pt started taking tegretol daily yesterday. Wasnt sure whether to take it because she thought it was a seizure medication. Currently no HI/SI. [name]  Depression Treatment History:  Prior Medication Used:   Start Date: Assessment of Effect:   Comments:  Celexa (citalopram)     08/2009   no improvement/noncompliant       --  Hypertension History:      She complains of visual symptoms, but denies chest pain, palpitations, orthopnea, and PND.  Further comments include: Visual symptoms recurrent. Has appt with ophtholmologist 06/2010. has only taken 1/2 of lisinopril-HCTZ today. Reports taking lisinopril-hctz on two times a day schedule per pt. .        Positive major cardiovascular risk factors include female age 38 years old or older, diabetes, hyperlipidemia, hypertension, and current tobacco user.        Positive history for target organ damage include cardiac  end organ damage (either CHF or LVH), peripheral vascular disease, and hypertensive retinopathy.  Further assessment for target organ damage reveals no history of stroke/TIA or renal insufficiency.      Current Medications (verified): 1)  Adult Aspirin Low Strength 81 Mg  Tbdp (Aspirin) .... One Tab By Mouth Qday 2)  Lantus 100 Unit/ml Soln (Insulin Glargine) .... Take 40 Units Daily in Am 3)  Miralax  Powd (Polyethylene Glycol 3350) .Marland KitchenMarland KitchenMarland Kitchen 17 Gm in 8 Oz Fluid Po Daily Until Regular Then As Needed For Constipation 4)  Ranitidine Hcl 150 Mg Caps (Ranitidine Hcl) .... One By Mouth Two Times A Day 5)  Nitrostat 0.3 Mg Subl (Nitroglycerin) .... Take Under Tongue As Needed For Chest Pain, May Take Up To 3 Times Every Five Minutes. 6)  Bd Insulin Syringe Microfine 27g X 5/8" 1 Ml Misc (Insulin Syringe-Needle U-100) .... Use As Directed 7)  Bd Pen Needle Short U/f 31g X 8 Mm Misc (Insulin Pen Needle) .... Use As Directed 8)  Lisinopril-Hydrochlorothiazide 20-12.5 Mg Tabs (Lisinopril-Hydrochlorothiazide) .... 2 Pills Daily 9)  Lopressor 50 Mg Tabs (Metoprolol Tartrate) .... 1/2 Tablet Daily 10)  Tegretol 200 Mg Tabs (Carbamazepine) .... 1/2 Tablet Twice Daily 11)  Reglan 10 Mg Tabs (Metoclopramide Hcl) .[name].. 1 Tab By Mouth  10 Mins Prior To Meals  Allergies: 1)  Ibuprofen  Physical Exam  General:  alert.  NAD Head:  normocephalic and atraumatic.   Eyes:  vision grossly intact.   Nose:  no external deformity.   Mouth:  teeth missing  Neck:  supple and full ROM.   Lungs:  CTAB, no wheezes, rales, rhoncii Heart:  RRR, no rubs, gallops, murmurs  Abdomen:  S/NT/ND Extremities:  2+ peripheral pulses, no edema   Impression & Recommendations:  Problem # 1:  BIPOLAR II DISORDER (ICD-296.89) Day 2 of tegretol. No overt HI/SI. Encouraged to continue with use. Pt with lonstanding history of longstanding noncompliance. Broached issue of drug use which pt denies. Will UDS to rule out drug use/abuse as  part of mood picture.  Orders: Miscellaneous Lab [facility] [id number]) [place]- Est  Level 4 [id number])  Problem # 2:  HYPERTENSION, BENIGN ESSENTIAL, LABILE (ICD-401.1)  Pt currently partially medicated. Will likely need 3rd medication. Will likley start nirvasc at next clinical visit.  Her updated medication list for this problem includes:    Lisinopril-hydrochlorothiazide 20-12.5 Mg Tabs (Lisinopril-hydrochlorothiazide) .[name]... 2 pills daily    Lopressor 50 Mg Tabs (Metoprolol tartrate) .[name]... 1/2 tablet daily  Orders: [place]- Est  Level 4 [id number])  Problem # 3:  DEPRESSIVE DISORDER, NOS (ICD-311) See problem #1.  Orders: Miscellaneous Lab [facility] [id number]) [place]- Est  Level 4 [id number])  Complete Medication List: 1)  Adult Aspirin Low Strength 81 Mg Tbdp (Aspirin) .... One tab by mouth qday 2)  Lantus 100 Unit/ml Soln (Insulin glargine) .... Take 40 units daily in am 3)  Miralax Powd (Polyethylene glycol 3350) .Marland KitchenMarland Kitchen. 17 gm in 8 oz fluid po daily until regular then as needed for constipation 4)  Ranitidine Hcl 150 Mg Caps (Ranitidine hcl) .... One by mouth two times a day 5)  Nitrostat 0.3 Mg Subl (Nitroglycerin) .... Take under tongue as needed for chest pain, may take up to 3 times every five minutes. 6)  Bd Insulin Syringe Microfine 27g X 5/8" 1 Ml Misc (Insulin syringe-needle u-100) .... Use as directed 7)  Bd Pen Needle Short U/f 31g X 8 Mm Misc (Insulin pen needle) .... Use as directed 8)  Lisinopril-hydrochlorothiazide 20-12.5 Mg Tabs (Lisinopril-hydrochlorothiazide) .... 2 pills daily 9)  Lopressor 50 Mg Tabs (Metoprolol tartrate) .... 1/2 tablet daily 10)  Tegretol 200 Mg Tabs (Carbamazepine) .... 1/2 tablet twice daily 11)  Reglan 10 Mg Tabs (Metoclopramide hcl) .[name].. 1 tab by mouth 10 mins prior to meals  Hypertension Assessment/Plan:      The patient's hypertensive risk group is category C: Target organ damage and/or diabetes.  Her calculated 10 year risk of coronary heart disease is > 32  %.  Today's blood pressure is 155/79.  Her blood pressure goal is < 130/80.  Patient Instructions: 1)  It was good to see you today 2)  Continue taking the epitol as directed for your mood  3)  If you have any suicidal or homicidal thoughts that are pressing, please give [place] a call or go to [facility]  4)  Take the lisinopril-HCTZ-2 tabs twice daily  5)  I will also check some labs today 6)  Come back to see me on December 28th 7)  Otherwise call with any questions 8)  Merry Christmas and God [place],  9)  Shanda Howells MD    Orders Added: 1)  Miscellaneous Lab [place] [[id number]] 2)  [facility]- Est  Level 4 [id number]

## 2010-07-18 NOTE — Miscellaneous (Signed)
Summary: [facility]  [facility]   Imported By: Tobin Chad 07/02/2010 08:56:00  _____________________________________________________________________  External Attachment:    Type:   Image     Comment:   External Document

## 2010-07-18 NOTE — Progress Notes (Signed)
Summary: Insulin   Phone Note Call from Patient Call back at Home Phone [phone]   Reason for Call: Talk to Nurse Summary of Call: pt wants to know if her insulin is here? Initial call taken by: Samara Snide,  July 08, 2010 11:27 AM  Follow-up for Phone Call        message left on voicemail to call back. Follow-up by: Marcell Barlow RN,  July 08, 2010 11:52 AM  Additional Follow-up for Phone Call Additional follow up Details #1::        spoke with patient. she states she  has ordered meds thru a drug company and will be sent here and she was checking to see if we have received. advised that we do not have any meds for her here and that we generally do not have meds sent here for patients anymore. she will contact company to check on this. Additional Follow-up by: Marcell Barlow RN,  July 08, 2010 1:55 PM

## 2010-07-18 NOTE — Miscellaneous (Signed)
Summary: FMLA  Patient came by for lab visit and said she was told papers would be ready for her to pick up today.  She really  needs these papers asap. [name]  June 21, 2010 9:54 AM

## 2010-07-18 NOTE — Letter (Signed)
Summary: Generic Letter  [facility]  [address]   [place], [place] [address]   Phone: [phone]  Fax: [phone]    06/13/2010  [name] [address] [place], [place]  [address]  The above patient may return to work without restrictions as of 06/17/09.  If you have any questions please call my office at [phone].            Sincerely,   Dorcas Mcmurray, MD

## 2010-07-18 NOTE — Assessment & Plan Note (Signed)
Summary: Hypertension and Bipolar Followup   Vital Signs:  Patient profile:   55 year old female Height:      62 inches Weight:      142 pounds BMI:     26.07 Temp:     98.2 degrees F oral Pulse rate:   99 / minute BP sitting:   149 / 79  (right arm) Cuff size:   regular  Vitals Entered By: Schuyler Amor CMA (June 18, 2010 2:01 PM) CC: F/U, Depression, Hypertension Management Is Patient Diabetic? Yes   CC:  F/U, Depression, and Hypertension Management.  Depression History:      The patient is having a depressed mood most of the day but denies diminished interest in her usual daily activities.  Positive alarm features for a manic disorder include flight of ideas and psychomotor agitation.  She denies less need for sleep, talkative or feels need to keep talking, and distractibility.        The patient denies that she feels like life is not worth living, denies that she wishes that she were dead, and denies that she has thought about ending her life.        Comments:  Mood overall unchanged since last visit. Has not taken lamictal because pt reports medication is to expensive and cannot afford.  Has appt with behavioral health 06/27/10 per pt.  No current HI/SI. [name]  Depression Treatment History:  Prior Medication Used:   Start Date: Assessment of Effect:   Comments:  Celexa (citalopram)     08/2009   no improvement/noncompliant       --  Hypertension History:      She denies chest pain and palpitations.  Further comments include: Symptoms unchanged from previous visit. Has not been checking blood pressures at home. Was taking 1 tab of metoprolol for BP. Pt stopped taking 1 tab because of fatigue. Restarted taking 1/2 tablet.  [name]        Positive major cardiovascular risk factors include female age 75 years old or older, diabetes, hyperlipidemia, hypertension, and current tobacco user.        Positive history for target organ damage include cardiac end organ damage (either CHF or LVH),  peripheral vascular disease, and hypertensive retinopathy.  Further assessment for target organ damage reveals no history of stroke/TIA or renal insufficiency.      Allergies: 1)  Ibuprofen  Physical Exam  General:  alert.  NAD Eyes:  vision grossly intact.   Nose:  no external deformity.   Mouth:  teeth missing  Neck:  supple and full ROM.   Lungs:  CTAB, no wheezes, rales, rhoncii Heart:  RRR, no rubs, gallops, murmurs  Abdomen:  S/NT/ND Extremities:  2+ peripheral pulses, no edema Neurologic:  alert & oriented X3.   Psych:  flat affect.     Impression & Recommendations:  Problem # 1:  HYPERTENSION, BENIGN ESSENTIAL (ICD-401.1) Assessment Unchanged Will start on norvasc 10 mg daily. Will also check UA  and K and Cr. WIll follow up in 2-4 weeks.  Her updated medication list for this problem includes:    Lisinopril-hydrochlorothiazide 20-12.5 Mg Tabs (Lisinopril-hydrochlorothiazide) .[name]... 2 pills daily    Lopressor 50 Mg Tabs (Metoprolol tartrate) .[name]... 1/2 tablet daily    Norvasc 10 Mg Tabs (Amlodipine besylate) .[name]... 1 tablet daily  Orders: Urinalysis-[facility] (00000) [place]- Est  Level 4 [id number])  Problem # 2:  BIPOLAR II DISORDER (ICD-296.89) Assessment: Unchanged Will transition pt to lithium as lithium noted  on [facility] 4 dollar list. Discussed at length overall side effect profile. Pt does need some form of mood stabilization and has essentially failed multiple medications (tegretol, lamictal).  Will start on Li with very judicious watch on side effect profile. Pt has been compliant in weekly followup appts. UA and BMET obtained. Will check lithium level at end of the week.  Hopefully behavioral health can help in management in coming weeks.  Orders: [place]- Est  Level 4 ([id number])Future Orders: Miscellaneous Lab [place] [id number]) ... 06/21/2010  Complete Medication List: 1)  Adult Aspirin Low Strength 81 Mg Tbdp (Aspirin) .... One tab by mouth qday 2)  Lantus 100 Unit/ml Soln  (Insulin glargine) .... Take 40 units daily in am 3)  Miralax Powd (Polyethylene glycol 3350) .Marland KitchenMarland Kitchen. 17 gm in 8 oz fluid po daily until regular then as needed for constipation 4)  Ranitidine Hcl 150 Mg Caps (Ranitidine hcl) .... One by mouth two times a day 5)  Nitrostat 0.3 Mg Subl (Nitroglycerin) .... Take under tongue as needed for chest pain, may take up to 3 times every five minutes. 6)  Bd Insulin Syringe Microfine 27g X 5/8" 1 Ml Misc (Insulin syringe-needle u-100) .... Use as directed 7)  Bd Pen Needle Short U/f 31g X 8 Mm Misc (Insulin pen needle) .... Use as directed 8)  Lisinopril-hydrochlorothiazide 20-12.5 Mg Tabs (Lisinopril-hydrochlorothiazide) .... 2 pills daily 9)  Lopressor 50 Mg Tabs (Metoprolol tartrate) .... 1/2 tablet daily 10)  Reglan 10 Mg Tabs (Metoclopramide hcl) .[name].. 1 tab by mouth 10 mins prior to meals 11)  Lamictal 25 Mg Tabs (Lamotrigine) .[name].. 1 tab daily x14 days, then 2 tabs daily x 14 days, then 4 tabs daily x 1 week 12)  Norvasc 10 Mg Tabs (Amlodipine besylate) .[name].. 1 tablet daily 13)  Lithium Carbonate 300 Mg Caps (Lithium carbonate) .[name].. 1 tab by mouth two times a day 14)  Do Not Fill Lamictal   Other Orders: Basic Met-[facility] [id number]) A1C-[facility] [id number])  Hypertension Assessment/Plan:      The patient's hypertensive risk group is category C: Target organ damage and/or diabetes.  Her calculated 10 year risk of coronary heart disease is > 32 %.  Today's blood pressure is 149/79.  Her blood pressure goal is < 130/80.  Patient Instructions: 1)  It was good to see you today  2)  Pick up the lithium and begin taking tonight 3)  Come back on friday for [place] to check a lithium level 4)  If you notice any tremor, increased thirst, increased urination or any other unusual symptoms, please give [place] a call.  5)  Come back next week to see me for a followup appointment about your mood.  6)  We will followup on your A1C at your next visit 7)  Otherwise call with any  questions, 8)  Shanda Howells MD  Prescriptions: LITHIUM CARBONATE 300 MG CAPS (LITHIUM CARBONATE) 1 tab by mouth two times a day  #28 x 0   Entered and Authorized by:   Shanda Howells MD   Signed by:   Shanda Howells MD on 06/18/2010   Method used:   Electronically to        [facility] (retail)       [address]       [place], [place]  [address]       Ph: [phone]       Fax: [phone]   RxID:  [id number] NORVASC 10 MG TABS (AMLODIPINE BESYLATE) 1 tablet daily  #30 x 6   Entered and Authorized by:   Shanda Howells MD   Signed by:   Shanda Howells MD on 06/18/2010   Method used:   Electronically to        [facility] (retail)       [address]       [place], [place]  [address]       Ph: [phone]       Fax: [phone]   RxID:   [id number]    Orders Added: 1)  Basic Met-[facility] [phone] 2)  A1C-[facility] [83036] 3)  Urinalysis-[facility] [00000] 4)  Miscellaneous Lab [place] [id number] 5)  [facility]- Est  Level 4 [id number]    Laboratory Results   Urine Tests  Date/Time Received: June 18, 2010 2:55 PM  Date/Time Reported: June 18, 2010 3:22 PM   Routine Urinalysis   Color: yellow Appearance: Clear Glucose: negative   (Normal Range: Negative) Bilirubin: negative   (Normal Range: Negative) Ketone: negative   (Normal Range: Negative) Spec. Gravity: 1.010   (Normal Range: 1.003-1.035) Blood: negative   (Normal Range: Negative) pH: 5.5   (Normal Range: 5.0-8.0) Protein: negative   (Normal Range: Negative) Urobilinogen: 0.2   (Normal Range: 0-1) Nitrite: negative   (Normal Range: Negative) Leukocyte Esterace: negative   (Normal Range: Negative)    Comments: .........Marland Kitchenbiochemical negative; microscopic not indicated ...............test performed by......Marland KitchenBonnie A. Martinique, MLS (ASCP)cm   Blood Tests   Date/Time Received: June 18, 2010 1:58 PM  Date/Time Reported: June 18, 2010 2:18  PM   HGBA1C: 8.4%   (Normal Range: Non-Diabetic - 3-6%   Control Diabetic - 6-8%)  Comments: ...............test performed by......Marland KitchenBonnie A. Martinique, MLS (ASCP)cm

## 2010-07-18 NOTE — Miscellaneous (Signed)
Summary: FMLA  Patient needs the FMLA papers that she left for you to fill out.  She has to have them by Monday or else she will lose her job.  Her phone number is [phone]. Beryle Lathe  June 13, 2010 10:10 AM

## 2010-07-18 NOTE — Progress Notes (Signed)
Summary: FMLA   Phone Note Call from Patient Call back at Home Phone [phone]   Reason for Call: Talk to Nurse Summary of Call: needs to be sure FMLA can be filled out today, if not she will lose her job, see update from  yesterday Initial call taken by: Samara Snide,  June 14, 2010 9:20 AM  Follow-up for Phone Call        Call and spoke to pt about FMLA paperwork. Pt was seen by [facility] for shoulder pain and sent to PT. Pt states that FMLA was concerning shoulder pain andshe plans to go to work today to clarify FMLA info needed for return to work. Instructed pt to call back if there is any other additional information needed given clearance by Dr. Nori Riis. Pt agreeable to plan.  Shanda Howells MD December, 30 2011 2:46 PM

## 2010-07-18 NOTE — Miscellaneous (Signed)
Summary: NO WORK RESTRICTIONS   Clinical Lists Changes patient called yesterday wanting a "return to wrok" note. 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 We did not say she could not do this job, were never asked. She did have her last PT session  on 12/29 and I have reviewed that which shows forward flexion 105, abduction >75 so I think she can perform the functions described to [place] over the phone. I did giove her a letter saying she had no restrictions as she was evidently about to lose her job. But noted that we never erstricted her work activity. copy of PT report will be scanned to chart. Also noted she did not rtc as we had requested.

## 2010-07-24 ENCOUNTER — Telehealth: Payer: Self-pay | Admitting: Family Medicine

## 2010-07-25 ENCOUNTER — Telehealth: Payer: Self-pay | Admitting: *Deleted

## 2010-07-25 NOTE — Telephone Encounter (Signed)
Spoke with [name] and he does not know anything about insulin being sent here from  a  drug company. We no longer receive meds from drug companies. He suggests patient check at [facility] to see if it was sent there. Patient voices understanding.

## 2010-07-25 NOTE — Telephone Encounter (Signed)
Closing encounter

## 2010-08-02 ENCOUNTER — Telehealth: Payer: Self-pay | Admitting: Family Medicine

## 2010-08-02 NOTE — Telephone Encounter (Signed)
States she just called the pt assistance & they told her it would be in about 1 wk. Urged her to call for refills earlier. Told her we have 1 vial here she can have. No more samples. Must plan for re-supplies. States she is at work & will send her spouse here to get it

## 2010-08-02 NOTE — Telephone Encounter (Signed)
Pt called again about getting some insulin She is now at work and can be reached at [facility]

## 2010-08-09 ENCOUNTER — Telehealth: Payer: Self-pay | Admitting: *Deleted

## 2010-08-09 NOTE — Telephone Encounter (Signed)
Called pt and informed her that her insulin is here and ready to be picked up.Ronnald Nian, [name]

## 2010-08-09 NOTE — Telephone Encounter (Signed)
Gave spouse 4 vials of lantus.Marcelline Deist, Verl Bangs

## 2010-08-25 ENCOUNTER — Emergency Department (HOSPITAL_COMMUNITY)
Admission: EM | Admit: 2010-08-25 | Discharge: 2010-08-26 | Disposition: A | Discharge status: Home / Self Care | Payer: Self-pay | Attending: Emergency Medicine | Admitting: Emergency Medicine

## 2010-08-25 ENCOUNTER — Emergency Department (HOSPITAL_COMMUNITY): Payer: Self-pay

## 2010-08-25 DIAGNOSIS — F329 Major depressive disorder, single episode, unspecified: Secondary | ICD-10-CM | POA: Insufficient documentation

## 2010-08-25 DIAGNOSIS — F3289 Other specified depressive episodes: Secondary | ICD-10-CM | POA: Insufficient documentation

## 2010-08-25 DIAGNOSIS — M129 Arthropathy, unspecified: Secondary | ICD-10-CM | POA: Insufficient documentation

## 2010-08-25 DIAGNOSIS — M25559 Pain in unspecified hip: Secondary | ICD-10-CM | POA: Insufficient documentation

## 2010-08-25 DIAGNOSIS — E119 Type 2 diabetes mellitus without complications: Secondary | ICD-10-CM | POA: Insufficient documentation

## 2010-08-25 DIAGNOSIS — N949 Unspecified condition associated with female genital organs and menstrual cycle: Secondary | ICD-10-CM | POA: Insufficient documentation

## 2010-08-25 DIAGNOSIS — R5381 Other malaise: Secondary | ICD-10-CM | POA: Insufficient documentation

## 2010-08-25 DIAGNOSIS — E7800 Pure hypercholesterolemia, unspecified: Secondary | ICD-10-CM | POA: Insufficient documentation

## 2010-08-25 DIAGNOSIS — R5383 Other fatigue: Secondary | ICD-10-CM | POA: Insufficient documentation

## 2010-08-25 DIAGNOSIS — I10 Essential (primary) hypertension: Secondary | ICD-10-CM | POA: Insufficient documentation

## 2010-08-25 DIAGNOSIS — Z794 Long term (current) use of insulin: Secondary | ICD-10-CM | POA: Insufficient documentation

## 2010-08-25 LAB — GLUCOSE, CAPILLARY: Glucose-Capillary: 250 mg/dL — ABNORMAL HIGH (ref 70–99)

## 2010-08-26 ENCOUNTER — Emergency Department (HOSPITAL_COMMUNITY): Payer: Self-pay

## 2010-08-26 ENCOUNTER — Encounter (HOSPITAL_COMMUNITY): Payer: Self-pay

## 2010-08-27 ENCOUNTER — Inpatient Hospital Stay (INDEPENDENT_AMBULATORY_CARE_PROVIDER_SITE_OTHER)
Admission: RE | Admit: 2010-08-27 | Discharge: 2010-08-27 | Disposition: A | Discharge status: Home / Self Care | Payer: Self-pay | Source: Ambulatory Visit

## 2010-08-27 ENCOUNTER — Emergency Department (HOSPITAL_COMMUNITY)
Admission: EM | Admit: 2010-08-27 | Discharge: 2010-08-28 | Disposition: A | Discharge status: Home / Self Care | Payer: Self-pay | Attending: Emergency Medicine | Admitting: Emergency Medicine

## 2010-08-27 DIAGNOSIS — E7800 Pure hypercholesterolemia, unspecified: Secondary | ICD-10-CM | POA: Insufficient documentation

## 2010-08-27 DIAGNOSIS — R109 Unspecified abdominal pain: Secondary | ICD-10-CM | POA: Insufficient documentation

## 2010-08-27 DIAGNOSIS — M25559 Pain in unspecified hip: Secondary | ICD-10-CM | POA: Insufficient documentation

## 2010-08-27 DIAGNOSIS — Z794 Long term (current) use of insulin: Secondary | ICD-10-CM | POA: Insufficient documentation

## 2010-08-27 DIAGNOSIS — F3130 Bipolar disorder, current episode depressed, mild or moderate severity, unspecified: Secondary | ICD-10-CM | POA: Insufficient documentation

## 2010-08-27 DIAGNOSIS — M79609 Pain in unspecified limb: Secondary | ICD-10-CM | POA: Insufficient documentation

## 2010-08-27 DIAGNOSIS — Z79899 Other long term (current) drug therapy: Secondary | ICD-10-CM | POA: Insufficient documentation

## 2010-08-27 DIAGNOSIS — E119 Type 2 diabetes mellitus without complications: Secondary | ICD-10-CM | POA: Insufficient documentation

## 2010-08-27 DIAGNOSIS — I10 Essential (primary) hypertension: Secondary | ICD-10-CM | POA: Insufficient documentation

## 2010-08-27 LAB — URINALYSIS, ROUTINE W REFLEX MICROSCOPIC
Bilirubin Urine: NEGATIVE
Ketones, ur: NEGATIVE mg/dL
Nitrite: NEGATIVE
Specific Gravity, Urine: 1.013 (ref 1.005–1.030)
Urobilinogen, UA: 0.2 mg/dL (ref 0.0–1.0)

## 2010-08-27 LAB — POCT I-STAT, CHEM 8
Calcium, Ion: 1.19 mmol/L (ref 1.12–1.32)
Glucose, Bld: 182 mg/dL — ABNORMAL HIGH (ref 70–99)
HCT: 33 % — ABNORMAL LOW (ref 36.0–46.0)
Hemoglobin: 11.2 g/dL — ABNORMAL LOW (ref 12.0–15.0)

## 2010-08-27 LAB — D-DIMER, QUANTITATIVE: D-Dimer, Quant: 0.83 ug/mL-FEU — ABNORMAL HIGH (ref 0.00–0.48)

## 2010-08-28 ENCOUNTER — Ambulatory Visit (HOSPITAL_COMMUNITY)
Admission: RE | Admit: 2010-08-28 | Discharge: 2010-08-28 | Disposition: A | Discharge status: Home / Self Care | Payer: Self-pay | Source: Ambulatory Visit | Attending: Family Medicine | Admitting: Family Medicine

## 2010-08-28 DIAGNOSIS — M7989 Other specified soft tissue disorders: Secondary | ICD-10-CM | POA: Insufficient documentation

## 2010-08-28 DIAGNOSIS — M79609 Pain in unspecified limb: Secondary | ICD-10-CM

## 2010-08-30 ENCOUNTER — Ambulatory Visit: Payer: Self-pay | Admitting: Family Medicine

## 2010-08-30 NOTE — Telephone Encounter (Signed)
Gave med to spouse.Aileen Pilot

## 2010-11-01 NOTE — Discharge Summary (Signed)
[name], [name]                           ACCOUNT NO.:  [id number]   MEDICAL RECORD NO.:  [id number]                   PATIENT TYPE:  INP   LOCATION:  [id number]                                 FACILITY:  [facility]   PHYSICIAN:  Beatrix Fetters, MD                      DATE OF BIRTH:  1955/07/24   DATE OF ADMISSION:  09/28/2003  DATE OF DISCHARGE:  09/30/2003                                 DISCHARGE SUMMARY   DISCHARGE DIAGNOSES:  1. Hyperglycemia associated with diabetes type 2.  2. Tobacco abuse.   DISCHARGE MEDICATIONS:  1. Lantus 12 units subcu q.h.s.  2. Glipizide 5 mg p.o. q. day.  3. Nicotine patch, apply one each day.   PROCEDURES:  EKG:  Normal sinus rhythm.   HISTORY OF PRESENTING ILLNESS:  This is a 55 year old African-American  female with a history of diabetes type 2 who presented to the [facility]  Emergency Department with a three week history of polydipsia, polyuria and  leg pain increasing frequency and also complaining of nausea and vomiting  x1.  The patient gives a history at the time of admission of having been  discharged from [facility] in February 2005 with a diagnosis of viral  meningitis.  At that time, she did not receive any prescriptions for  diabetic medications and states that she could not have afforded them even  if she had.  She had some extra Lantus and a few diabetic pills left over  from before her hospital admission which she took for a few days, but ran  out.   REVIEW OF SYSTEMS:  Significant for a temperature to 101, occasional chills,  some shortness of air.   ADMISSION PHYSICAL AND LAB DATA:  VITAL SIGNS:  Temperature 99, blood  pressure 145/72, heart rate 98, respirations 22, 100% O2 saturation on 12  liters by nasal cannula.  GENERAL:  This is a pleasant, healthy-appearing, African-American young  female in no apparent distress, breathing comfortably off of oxygen in the  examining room.  HEENT:  Dry mucous membranes.  EXTREMITIES:   Diffuse bilateral lower extremity tenderness to palpation.  Otherwise within normal limits.   LAB DATA:  White count 7.8, hemoglobin 12.9, hematocrit 37.7, platelets 246,  total protein 6.4, albumin 3.3, AST 14, ALT 13, alkaline phosphatase 108,  total bili 0.7, direct bili 0.1, indirect bili 0.6, lipase 50.  Sodium 132  which corrects to 141 when you take into consideration her glucose of 659,  potassium 4.5, chloride 101, bicarb 21, BUN 9, creatinine 0.5, pH 7.282,  pCO2 44, base deficit of 6.   HOSPITAL COURSE:  1. The patient was admitted to [facility] for     management of hyperglycemia which seems likely secondary to medical     noncompliance.  The patient was started on Glucommander and she was given     aggressive  fluid rehydration with rapid correction of her anion gap.  On     hospital day number two, anion gap was closed and the patient was doing     well off of the IV Glucommander on sliding scale insulin.  Diabetes     educator was consulted and discussed medical compliance with the patient.     Care management was involved also as financial issues loom large for this     patient.  Interestingly, the patient had a urinalysis which had a glucose     greater than 1,000 with negative protein.  The patient's hemoglobin A1c     was found to be 12.9.  This represents poor control prior to hospital     admission.  Capillary blood glucose while in the hospital ranged from 143-     341.  The patient was discharged on medications described above and     instructed to follow-up with myself, Dr. Elana Alm, at [facility].   Regarding to her leg pain that the patient reported at the time of  admission, this has been on ongoing problem for this patient, fairly chronic  and intermittent in nature.  She uses ibuprofen on a p.r.n. basis with  moderate relief.  It is my sense that this is at least the beginning of  diabetic neuropathy.  The patient  was advised that the best way to control  this would be to control her blood sugars.  No pain medicine provided at the  time of discharge.  1. Tobacco abuse.  The patient was given a nicotine patch at the time of     admission and counseled about smoking cessation.   ADDITIONAL LAB DATA:  TSH 0.797.  Discharge BUN and creatinine 3 and 0.6,  respectively.                                                Beatrix Fetters, MD    TD/MEDQ  D:  10/19/2003  T:  10/20/2003  Job:  [id number]

## 2010-11-01 NOTE — Cardiovascular Report (Signed)
[name], [name]               ACCOUNT NO.:  [id number]   MEDICAL RECORD NO.:  [id number]          PATIENT TYPE:  OBV   LOCATION:  [id number]                         FACILITY:  [facility]   PHYSICIAN:  Jeanella Craze. Little, M.D. DATE OF BIRTH:  [birth year]   DATE OF PROCEDURE:  04/25/2004  DATE OF DISCHARGE:                              CARDIAC CATHETERIZATION   INDICATIONS FOR TEST:  This 54 year old female was admitted on April 24, 2004, complaining of exertional chest pain relieved with rest.  This had  been occurring for the last 48 hours prior to her admission.  She smokes  cigarettes, has diabetes mellitus for about six years that has been poorly  controlled, and a family history of heart disease.   Her EKG is unremarkable.  Her cardiac enzymes are negative.   After obtaining informed consent, the patient was prepped and draped in the  usual sterile fashion, exposing the right groin.  She was given 2 mg of IV  Versed for relaxation.  Using the Seldinger technique, a 5-French introducer  sheath was placed into the right femoral artery.  Left and right coronary  arteriography and ventriculography in the RAO projection was performed.   COMPLICATIONS:  None.   EQUIPMENT:  5-French Judkins configuration catheters.   TOTAL CONTRAST:  90 cc.   HEMODYNAMIC MONITORING:  The central aortic pressure was 163/86.  Left  ventricular pressure was 162/13 with no aortic valve gradient noted at the  time of pullback.   VENTRICULOGRAPHY:  Ventriculography in the RAO projection was performed x2.  Ventricular ectopy during the first ventriculogram prevented evaluation for  LV function.  There appeared to be normal LV systolic function.  There was a  single PVC during the second ventriculogram that caused mitral  regurgitation, but on the beats that were not associated with ventricular  ectopy, no mitral regurgitation was seen.  The ejection fraction was greater  than 60%, and the end diastolic  pressure was 21.   CORONARY ARTERIOGRAPHY:  On fluoroscopy, no calcification was seen.   FINDINGS:  1.  LEFT MAIN:  Normal.  It bifurcated.  2.  LAD:  The LAD crossed the apex of the heart.  It gave rise to a moderate-      sized first diagonal.  There were minimal irregularities at the      bifurcation of the LAD and first diagonal.  3.  CIRCUMFLEX:  The circumflex in the AV groove gave rise to a small      terminal OM #3.  There was a small OM #2 that was free of disease.      There was a very proximal, large bifurcating OM #1 that was free of      disease.  4.  RCA.  A small PDA, but normal.   CONCLUSION:  1.  Normal LV systolic function.  2.  No evidence of significant epicardial coronary disease.   I cannot explain her chest pain based on her coronary anatomy.  Because of  her history of reflux, this may be GI in nature, although her relationship  to activity and  pain does not truly fit a GI complaint either.  The patient  should be ready for discharge from a cardiac standpoint in the morning.  We  will reevaluate on a p.r.n. basis.       ABL/MEDQ  D:  04/25/2004  T:  04/25/2004  Job:  [id number]   cc:   Blane Ohara McDiarmid, M.D.  [id number]   Catheterization Laboratory

## 2010-11-01 NOTE — Op Note (Signed)
[name], [name]               ACCOUNT NO.:  [id number]   MEDICAL RECORD NO.:  [id number]          PATIENT TYPE:  INP   LOCATION:  [id number]                         FACILITY:  [facility]   PHYSICIAN:  Merri Ray. Grandville Silos, M.D.DATE OF BIRTH:  December 27, 1955   DATE OF PROCEDURE:  08/14/2006  DATE OF DISCHARGE:                               OPERATIVE REPORT   PREOPERATIVE DIAGNOSIS:  Scalp abscess posterior to the left ear.   POSTOPERATIVE DIAGNOSIS:  Scalp abscess posterior to the left ear.   PROCEDURE:  Incision and drainage of scalp abscess.   SURGEON:  Merri Ray. Grandville Silos, MD   HISTORY OF PRESENT ILLNESS:  The patient is a 55 year old African  American female with a history of diabetes, who has a 1-week history of  a painful swelling behind her left ear.  She initially noticed soreness  after having her hair done and she was evaluated in our urgent office by  my partner,  Dr. Excell Seltzer, and the area was very tender and fluctuant,  and she is brought emergently to the operating room for incision and  drainage.   PROCEDURE IN DETAIL:  Informed consent was obtained.  The patient  received intravenous antibiotics.  She was brought to the operating  room.  General anesthesia was administered.  Her hair was trimmed  slightly and her left lateral scalp was prepped and draped in a sterile  fashion.  An elliptical incision was made to encompass the small  punctate pustules over the top of this abscess, and that was excised.  Dissection down into the subcutaneous tissues revealed a pocket of thick  purulent material.  Cultures were sent.  This was completely cleaned out  and hemostasis was obtained.  The wound was copiously irrigated and  packed with quarter-inch Iodoform gauze.  A sterile dressing was  applied.  The patient tolerated the procedure well without apparent  complication.  Sponge, needle, and instrument counts were correct.  She  was taken to the recovery room in stable  condition.      Merri Ray Grandville Silos, M.D.  Electronically Signed     BET/MEDQ  D:  08/14/2006  T:  08/15/2006  Job:  [id number]

## 2010-11-01 NOTE — Discharge Summary (Signed)
[name], [name]                           ACCOUNT NO.:  [id number]   MEDICAL RECORD NO.:  [id number]                   PATIENT TYPE:  INP   LOCATION:  [id number]                                 FACILITY:  [facility]   PHYSICIAN:  Levy Sjogren, M.D.                  DATE OF BIRTH:  12/26/1955   DATE OF ADMISSION:  06/03/2003  DATE OF DISCHARGE:  06/07/2003                                 DISCHARGE SUMMARY   DISCHARGE DIAGNOSES:  1. Alcoholism.  2. Alcohol withdrawal.  3. Syncope secondary to alcohol overdose.  4. Poorly controlled type 2 diabetes.  5. Depression.   DISCHARGE MEDICATIONS:  1. Actos 30 mg p.o. daily.  2. Glipizide 20 mg p.o. daily.  3. Effexor XR 75 mg p.o. daily.   CONSULTATIONS:  Psychiatry, Dr. Rhona Raider.   PROCEDURES:  Head CT scan on June 03, 2003, showed no intracranial  abnormalities.   HISTORY AND PHYSICAL AND HOSPITAL COURSE:  The patient is a 55 year old  African-American female who had diabetes and a new diagnosis of depression.  After her mother's death she went out to party and had an episode of being  unresponsive.  She was brought to the emergency department for evaluation.  Her blood alcohol level was found to be 80 with upper normal of 10.  Urine  drug screen was positive for marijuana.  Head CT scan was negative.  Urinalysis was negative.  Hemoglobin A1C was elevated at 11.8.  Cardiac  enzymes were normal, so were thyroid hormones.  Her physical exam revealed  stable vital signs and no significant abnormalities.  The patient was  admitted for evaluation and treatment.  She had a few episodes of nausea and  vomiting and hypokalemia which was treated.  She went into acute alcohol  withdrawal.  Was treated with benzodiazepines and we were successfully able  to withdraw her from alcohol.  We consulted Dr. Rhona Raider from psychiatry to  evaluate her suicidal depression component, and the patient was started on  Effexor for depression, and set up for an  outpatient Behavioral and [facility].  The patient made a remarkable recovery.  Because of all of her  medications for diabetes were increased, she will be connected with the  indigent care to help pay for medications.                                                Levy Sjogren, M.D.    SA/MEDQ  D:  06/07/2003  T:  06/08/2003  Job:  [id number]

## 2010-11-01 NOTE — Discharge Summary (Signed)
[name], [name]               ACCOUNT NO.:  [id number]   MEDICAL RECORD NO.:  [id number]          PATIENT TYPE:  OBV   LOCATION:  [id number]                         FACILITY:  [facility]   PHYSICIAN:  Cletus Gash T. Pickard II, MDDATE OF BIRTH:  09/25/1955   DATE OF ADMISSION:  04/24/2004  DATE OF DISCHARGE:  04/26/2004                                 DISCHARGE SUMMARY   PRIMARY CARE PHYSICIAN:  Dr. Marcello Moores Day at [facility].   CONSULTING PHYSICIAN:  Dr. Rex Kras with [facility].   DISCHARGE DIAGNOSES:  1.  Typical chest pain, rule out myocardial infarction.  2.  History of gastroesophageal reflux disease.  3.  History of depression.  4.  Diabetes mellitus type 2, under poor control.   PROCEDURES:  1.  Chest x-ray take April 24, 2004, shows no active cardiopulmonary      disease.  2.  Catheterization performed November 10 by Dr. Rex Kras shows normal left      ventricular systolic function and no evidence of significant epicardial      coronary disease with an EF greater than 60%.  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   LABORATORY DATA:  Admission CBC showed a white count of 7.4, hemoglobin  12.8, hematocrit of 37, platelets count of 314,000.  Point of care enzymes  were negative x2.  Cardiac enzymes in the hospital were negative x3.  Hemoglobin A1c was 10.1 on admission.  A repeat hemoglobin A1c taken the  next morning was 10.4.  A fasting lipid panel obtained on admission shows  cholesterol of 204, triglycerides of 194, HDL of 41, LDL of 124, VLDL of 39.  BNP on the day of discharge showed sodium of 136, potassium 3.7, chloride  104, bicarb 26, glucose 202, BUN 8, creatinine of 0.6.  Hemoglobin post  catheterization was 12.5, which is at baseline, hematocrit of 36.1, which is  at baseline.   HISTORY AND PHYSICAL:  The patient is a 55 year old African American female  admitted with typical chest pain, brought on  by exertion, radiating into the  left neck as well as the left arm, with associated shortness of breath and  nausea.  This pain is only related with movement and exertion, is relieved  by rest, and is 10/10 substernal in nature.  The patient was admitted to  rule out myocardial infarction, given the typical nature of her symptoms,  her significant family history of coronary artery disease, her history of  diabetes mellitus, and her history of tobacco use.   HOSPITAL COURSE:  1.  Chest pain, rule out myocardial infarction.  Initial point of care      cardiac enzymes were negative x2. Subsequent cardiac enzymes were      obtained q.8h. x3 and all were found to be within normal limits.  An EKG      taken on admission and an EKG taken in the a.m. the second day were both      normal with no evidence of ST elevation, inverted T waves or Q  waves.      However, given the concerning nature of her symptoms, a cardiology      consult was called. Dr. Rex Kras took the patient to catheterization on      April 25, 2004, and the results are dictated on [employer], but in short,      it was a normal catheterization with no evidence for cardiac component      to this pain.  It was felt that this patient has been sufficiently ruled      out from a cardiac standpoint.  However, we still maximized her      cardioprotective medicines to prevent future coronary artery disease.      We initiated the patient on a statin prior to discharge, Lipitor 10 mg      p.o. daily, and would appreciate it if Dr. Marcello Moores Day would titrate this      to a therapeutic dose as needed.  The patient was initially started on      heparin as well as Plavix, however, given the clear catheterization,      these medicines were discontinued.  2.  Gastroesophageal reflux disease.  The patient was started back on      Protonix 40 mg one p.o. daily, and instructed to continue this at the      time of discharge.  I would appreciate it if Dr.  Marcello Moores Day would work      up any GI component to this chest pain that he feels is necessary.  3.  History of depressive disorder with anxiety.  This is felt to also play      a large role in the patient symptoms.  The patient was continued on her      home dose of Effexor 75 mg p.o. daily, and was discharged home with this      as well.  Would appreciate it if Dr. Marcello Moores Day would follow up on this      as necessary.  4.  Musculoskeletal  chest wall pain.  On the morning of discharge, the      patient admitted that the pain was with movement and was reproducible by      palpation on exam, concerning mainly for chest wall pain. She was      discharged home with p.r.n. Motrin and Tylenol.  5.  Tobacco abuse.  The patient was counseled to discontinue smoking.  She      stated that she will give this some thought.  She was instructed to      follow up with Dr. Marcello Moores Day and discuss smoking cessation in the      future.   DISCHARGE MEDICATIONS:  1.  Aspirin 81 mg one p.o. daily.  2.  Metoprolol 25 mg one p.o. b.i.d.  3.  Protonix 40 mg one p.o. daily.  4.  Effexor 75 mg one p.o. daily.  5.  Lantus 15 units subcu q.h.s.  6.  Glucotrol 10 mg one p.o. b.i.d.  7.  Lipitor 10 mg one p.o. daily.  8.  P.r.n. Motrin for chest wall pain and p.r.n. Tylenol for chest wall      pain.   FOLLOWUP:  The patient is instructed to follow up with Dr. Marcello Moores Day at the  [facility] on Monday, May 06, 2004, at 2:30 p.m.  I would  appreciate it if Dr. Elana Alm would follow her Lipitor and titrate it to an  effective dose.  Also, follow up on  any GI component to her chest pain.       WTP/MEDQ  D:  04/26/2004  T:  04/26/2004  Job:  [id number]   cc:   Beatrix Fetters, MD  Fax: [place], M.D.  [address]  [place] [address]  Fax: [phone]

## 2010-11-01 NOTE — Consult Note (Signed)
[facility]  Patient:    [name], [name]                        MRN: [id number] Adm. Date:  [id number] Disc. Date: [id number] Attending:  Lebron Conners                          Consultation Report  Patient is a 55 year old African-American female who was seen in the emergency room.  Presented with a two week history of cough, chest congestion, low grade temperature, and chills.  Complained of some chest pain with coughing.  Also noticed to have complaint of numbness in her extremities, sometimes in the left or right.  Also, occasionally some numbness and tingling in her feet. She has history of diabetes, fully controlled on Actos and Glucotrol.  She currently gets her health care at the [facility].  She reports that she sees Collie Siad at [facility].  PAST MEDICAL HISTORY:  Type 2 diabetes poorly controlled, hyperlipidemia, question of depression.  PAST SURGICAL HISTORY:  None.  MEDICATIONS:  Actos and Glucotrol.  She was on Zoloft which she stopped on her own.  ALLERGIES:  No known drug allergies.  SOCIAL HISTORY:  Smokes one pack a day.  FAMILY HISTORY:  Noncontributory.  PHYSICAL EXAMINATION  VITAL SIGNS:  Temperature 98.1, blood pressure 142/68, pulse 88 and regular, respirations 20, saturations 99-100%.  GENERAL:  Well appearing female in no acute distress.  NECK:  Supple.  No adenopathy.  HEENT:  Oropharynx:  Clear.  LUNGS:  Clear without any wheezing or rales.  CARDIAC:  Regular S1, S2 without any murmurs.  ABDOMEN:  Soft, nontender.  EXTREMITIES:  No edema.  LABORATORIES:  White count 6.8, normal hemoglobin.  Normal blood chemistries. CPK and troponin were negative.  EKG:  Normal sinus rhythm without any ST-T wave changes.  Chest x-ray:  No infiltrates.  ASSESSMENT: 1. Acute bronchitis. 2. Type 2 diabetes with some neuropathy.  PLAN:  Zithromax five day.  Humibid LA 600 b.i.d.  Tussionex cough syrup at night  p.r.n.  Follow-up with [facility] on Friday, July 19.  Come back to the emergency room if there is any worsening of symptoms. DD:  12/30/00 TD:  12/31/00 Job: [id number]

## 2010-11-01 NOTE — H&P (Signed)
[name], [name]               ACCOUNT NO.:  [id number]   MEDICAL RECORD NO.:  [id number]          PATIENT TYPE:  OBV   LOCATION:  [id number]                         FACILITY:  [facility]   PHYSICIAN:  Eliezer Lofts, MD        DATE OF BIRTH:  June 06, 1956   DATE OF ADMISSION:  04/24/2004  DATE OF DISCHARGE:  04/26/2004                                HISTORY & PHYSICAL   HISTORY OF PRESENT ILLNESS:  [name] is a 55 year old female with history  of diabetes, family history of coronary artery disease, and depression, who  presented to the emergency department with chest pain with exertion since  April 26, 2004. The pain is substernal and feels like pressure, it lasts  during activity such as moving at work and with housework. Following the  chest pain with exertion it stops immediately with rest. The patient's first  episode was on November 7th and then progressively more frequently up to  today. The patient denies any changes of chest pain with p.o. intake, she  denies anxiety, shortness of breath, nausea, or diaphoresis with the  episodes. She does state that she feels pain in her left neck and down her  left arm now. She currently does not have any chest pain while lying down.  In the ER the patient received aspirin 325 mg and nitroglycerin 10 sternal  paste.   REVIEW OF SYSTEMS:  No fever, no chills. No palpitations. Positive chest  pain. No shortness of breath or wheeze. Positive chronic cough,  nonproductive, increased slightly over the last two weeks. Positive nausea  and vomiting approximately one week ago, now resolved. No diarrhea,  constipation, rash. No weakness, except left arm feels heavy. No  suicidality. No myalgias. No vision changes. The patient not measuring blood  sugar because she is out of strips. No sore throat. No upper respiratory  tract infection. No dysuria and no bleeding.   PAST MEDICAL HISTORY:  1.  Diabetes mellitus, type 2.  2.  GERD.  3.  Depressive  disorder.  4.  Tension headaches.  5.  History of H. pylori on October 09, 2003. Question if this was treated.  6.  Status post abdominal hysterectomy in 1986.   MEDICATIONS:  1.  Effexor 75 mg p.o. daily.  2.  Glucotrol 10 mg p.o. b.i.d.  3.  Lantus 15 units subcu q.h.s.  4.  Protonix 40 mg p.o. daily.   FAMILY HISTORY:  The patient has a strong family history of coronary artery  disease and MI. Her father died at age 65 of an MI. Her mother died at 27  with diabetes and heart disease. Sister had a MI at age 93 and brother had a  MI at age 1.   SOCIAL HISTORY:  [name] lives with four grandchildren. She works full-time  at [employer]. She is going to school to get her GED.  She has a 30-pack year history of smoking and currently smokes one pack per  day. She has a questionable history of alcohol abuse, but denies it at this  time.  She  occasionally uses marijuana, but denies IV drug use and cocaine  use.   PHYSICAL EXAMINATION:  VITAL SIGNS: Temperature 97.6, heart rate 79 to 100,  respiratory rate 20 to 22, blood pressure 111 to 144 over 55 to 80, 99% on  room air.  GENERAL: A healthy-appearing female in no apparent distress. Normal insight  and judgment, alert and oriented times four.  HEENT: PERRLA. Extraocular movements intact. Tympanic membranes are clear.  Nares patent. Conjunctiva clear. Mucous membranes moist. Oropharynx without  erythema or exudate. No cervical lymphadenopathy. No neck masses. No  thyromegaly.  RESPIRATORY: Clear to auscultation bilaterally. No rales, rhonchi, or  wheezes. Normal work of breathing.  CARDIOVASCULAR: No tenderness to chest on palpation. No thrill. Regular rate  and rhythm. No murmurs, rubs, or gallops. Normal PMI. No peripheral edema.  MUSCULOSKELETAL: Trapezius muscle tenderness, but otherwise normal range of  motion. Strength 5/5 throughout. ABDOMEN: Soft, nontender. Normoactive bowel  sounds. No hepatosplenomegaly.   SKIN: No skin lesions. No rash.  NEUROLOGIC: Cranial nerves II-XII are grossly intact.   LABORATORY DATA:  WBC is 7.4, hemoglobin 12.8, platelet count 314,000. Point  of care markers negative times one. Sodium 139, potassium 4.3, chloride 106,  CO2 27, BUN 5, creatinine 0.6, glucose 237, calcium 9.0, AST 14, ALT 12,  alkaline phosphatase 103, total bilirubin 0.5, total protein 3.9, albumin  3.9.   Chest x-ray reveals no active disease. EKG reveals a sinus rhythm at 93  beats per minute. No bundle branch block. No change from April 2005.   ASSESSMENT/PLAN:  A 55 year old female with diabetes mellitus, smoker,  family history of coronary artery disease with:  1.  Typical chest pain: Will admit to telemetry bed to rule out MI with      cardiac enzymes q.8h. times three and EKG in the a.m. Will place the      patient on oxygen, aspirin, and beta blocker as well as nitroglycerin.      The patient is at high risk because of diabetes, smoking, and family      history. Other etiologies of chest pain include gastritis with untreated      Helicobacter pylori versus gastroesophageal reflux disease versus panic      attack versus esophageal spasm versus PE, although I doubt the PE      because she is not hypoxic, no shortness of breath. I would likely      consult cardiology in the a.m. for a stress test. As far as risk factor      modification, we will set up smoking cessation, check an FLP, and check      hemoglobin A1C. Will manage diabetes mellitus as below.  2.  Diabetes mellitus, type 2: Continue home dose of Lantus and Glucotrol.      Check Accu-Chek and give sliding scale coverage.  3.  Gastroesophageal reflux disease: Protonix p.o. daily. We will try and      locate in her record if H. pylori was treated.  4.  Depression, stable: Continue Effexor.  5.  FEN: Diabetes mellitus diet, heparin lock, electrolytes within normal      limits. 6.  Deep venous thrombosis prophylaxis: PAS hose  because avoiding ambulation      secondary to chest pain.       AB/MEDQ  D:  04/28/2004  T:  04/28/2004  Job:  [id number]

## 2010-11-01 NOTE — Discharge Summary (Signed)
[name], [name]                           ACCOUNT NO.:  [id number]   MEDICAL RECORD NO.:  [id number]                   PATIENT TYPE:  INP   LOCATION:  [id number]                                 FACILITY:  [facility]   PHYSICIAN:  Benito Mccreedy, M.D.             DATE OF BIRTH:  [birth year]   DATE OF ADMISSION:  08/07/2003  DATE OF DISCHARGE:  08/10/2003                                 DISCHARGE SUMMARY   DISCHARGE DIAGNOSES:  1. Viral meningitis.  2. Diabetes mellitus.   DISCHARGE MEDICATIONS:  The patient is to go home to continue her previous  medications, i.e.,  Glucotrol 20 mg q.d., Actos 10 mg q.d., Lantus 12 units  subq injection q.h.s.  The patient has adequate supply of these medications,  and does not require prescriptions.  New medication is Ceftin 500 mg p.o.  b.i.d.   DISCHARGE LABS:  Wbc count 4.8, hemoglobin 12.2, hematocrit 35.5, MCV 92.5,  platelet count 271, sodium 140, potassium 4.1, chloride 106, CO2 28, glucose  112, BUN 7, creatinine 0.6, calcium 9.6.   CONSULTANTS:  Not applicable.   PROCEDURES:  Lumbar puncture on admission, dated August 07, 2003, by  intervention radiology.   CONDITION ON DISCHARGE:  Improved and satisfactory.   REASON FOR ADMISSION:  Meningitis.  The patient is a 55 year old African-  American lady, pleasant indeed, who presented with increasing headaches and  fever for 24 hours.  She also had some complaints about neck pain.  She had  been feeling weak, and she also complained of generalized malaise.  The  patient did not have any complaints of dysuria, frequency of micturition,  nausea, or vomiting.  According to the admission H&P by Dr. Dillard Essex, the  patient's temperature was 101.1 degrees Fahrenheit with a pulse rate of 105.  Otherwise, her vital signs were unremarkable.  She had dry mucous membranes.  Oropharynx was clear.  She had some difficulty flexing her neck secondary to  pain, but there was no obvious neck stiffness per se.   Her lungs were clear  to auscultation.  Cardiac exam was significant for tachycardia without any  gallop.  Abdominal exam was unremarkable, and neurologic exam did not reveal  any obvious focal deficits.  Lab work was significant for hyponatremia of  128 with a glucose of 226.  Otherwise, basic metabolic panel was  unremarkable.  The patient had an LP done, and CSF analysis indicated  colorless in both tube 1 and 4.  Clarity was said to be hazy in both tube 1  and 4.  Wbc count was 354 per cubic mm in tube 1 and 369 per cubic mm in  tube 4, RBC of 475 per cubic mm and 22 per cubic mm in tubes 1 and 4,  respectively.  She had neutrophils of 31%, lymphocytes of 36%, monocytes of  32%, 0% eosinophils.  Glucose was 124 with a total protein of 123.  She  was  therefore admitted to the hospital for management of meningitis, likely  viral, to rule out bacterial meningitis.   HOSPITAL COURSE:  The patient was initially admitted to a stepdown unit,  subsequently transferred to a telemetry bed.  There were no significant  dysrhythmias.  She was started on IV vancomycin and Rocephin to cover  bacterial meningitis.  She had blood cultures which were drawn on admission,  and there has not been growth by the time of discharge.  She also had a gram  stain done for her CSF, which showed abundant wbc's, predominantly PMN and  mononuclear cells.  No organisms were seen.  Niger ink preparation of the  CSF was negative.  CSF culture reported on August 10, 2003 was negative.  The patient's vancomycin was discontinued after 24 hours of treatment.  She  remained on Rocephin during hospitalization.  Her mild leukocytosis has  resolved at the time of discharge.  She does not have any fever.  Her neck  pain has improved remarkably and almost resolved, such that she does not  require any pain medications.  The patient has been ambulatory on the floor  without any problems, has been eating well, and her generalized  weakness and  malaise have resolved.  It is believed that the cause of the patient's  meningitis is viral, and she is free to go today.  She is being discharged  home in stable and satisfactory condition.  The patient is being discharged  today with discharge vital signs of BP 126/77, temperature of 98.1degrees  Fahrenheit, pulse rate of 74 per minute, respiratory rate of 20, sat of 100%  on room air.                                                Benito Mccreedy, M.D.    GO/MEDQ  D:  08/10/2003  T:  08/10/2003  Job:  [id number]   cc:   [employer]

## 2010-11-01 NOTE — H&P (Signed)
[name]                           ACCOUNT NO.:  [id number]   MEDICAL RECORD NO.:  [id number]                   PATIENT TYPE:  INP   LOCATION:  [id number]                                 FACILITY:  [facility]   PHYSICIAN:  Rexene Alberts, M.D.                 DATE OF BIRTH:  May 18, 1956   DATE OF ADMISSION:  06/03/2003  DATE OF DISCHARGE:                                HISTORY & PHYSICAL   CHIEF COMPLAINT:  Nonresponsiveness.   HISTORY OF PRESENT ILLNESS:  [name] is a 55 year old woman  with a past  medical history significant for diabetes mellitus and recent depression  secondary to her mother's death who presents to the emergency department  this morning with an episode of nonresponsiveness. The history is provided  by both the patient and her friend who accompanied her last night and this  morning. The patient went out to a club/party last night. She was in her  normal state of health. She had two mixed drinks with brandy at the club.  She began to dance, however, shortly afterwards she began to feel sick.  The patient specifically meant that she became nauseated and subsequently  threw up when she went to sit back down at the table. She had two episodes  of nausea and vomiting in all. She proceeded to lay her head down on the  table to rest, however, when her friend tried to arouse her they could not  wake her up. There was no witnessed tampering of her mixed drinks per the  patient and per the friend who accompanies here. The patient had brandy  mixed with a fruit punch and it is uncertain if this combination caused the  nausea and vomiting. The patient remembers getting dizzy and sitting down,  however, she does not recall what follows afterwards. Her friend states that  the patient became diaphoretic for a few minutes when she was attempting  awake the patient. The patient had no preceding prodromal symptoms other  than feeling dizzy. The patient actually took clonazepam 0.5  mg three times  during the day yesterday as prescribed. It was the first time that she had  taken clonazepam and drunk alcohol in the same day. The patient was  prescribed the clonazepam on the 14th, however, she filled the prescription  on June 01, 2003. The patient has been going to mental health for the  past two weeks secondary to major depression and grief secondary to her  mother's recent death.   The patient states that she has had chronic chest pain, shortness of breath,  and palpitations on and off for the past week since her mother died. She  readily admits to depression, sadness, poor appetite, a lack of energy,  weight loss, chronic headaches, and frequent crying spells ove the past two  weeks. She denies fever, but she has had intermittent chills. She denies  abdominal pain,  diarrhea, melena, hematochezia, dysuria, or hematuria. She  denies any upper respiratory infection symptoms. She does have blurred  vision, which she feels is secondary to her diabetes. She admits that she  has not taken any of her diabetes medications. She usually takes Glucotrol  and Actos, but as stated before, she has been noncompliant. The patient also  intermittently takes Ambien for sleep, but she states she has not over the  past couple of nights. The patient was previously treated with BuSpar and  Zoloft in the distant past, however, she has been off of these medications  for at least a year or two. The patient denies suicidal ideation, however,  she strongly misses her mother and also her sister who also died  approximately a month ago.   When the patient was evaluated in the emergency department initially she was  somewhat obtunded, however, during the course of the hospital stay the  patient became more alert. A CT scan of the head, as ordered by Dr. Tomi Bamberger  was negative. Her chest x-ray revealed questionable, mild, bilateral  perihilar infiltrates. Her lab work was significant for a  potassium of 2.5,  a glucose of 384, and alcohol level of 82. The remainder of her lab results  were unremarkable including a CBC, which was within normal limits. The  patient's urine drug screen was positive for THC. The patient will be  admitted for further evaluation and management of delirium,  nonresponsiveness/question syncope, and hypokalemia.   PAST MEDICAL HISTORY:  1. Type 2 diabetes mellitus diagnosed approximately five years ago.  2. Depression.  3. Status post total abdominal hysterectomy in the past, in the 1980s     secondary to fibroids.   MEDICATIONS:  1. Clonazepam 0.5 mg t.i.d.  2. Ambien 5 mg q.h.s. p.r.n.  3. Actos 15 mg daily.  4. Glucotrol 10 mg two tablets daily.   ALLERGIES:  No known drug allergies.   FAMILY HISTORY:  Her mother died two weeks ago at the age of 13 secondary to  possible cardiac arrest. She had a history of diabetes, hypertension, and  organic brain disease. Her father died secondary to complications of  diabetes, age unknown. She had one brother who died in his 73s secondary to  coronary artery disease and diabetes mellitus. She had 12 siblings in all,  all of which either had a history of either diabetes or heart disease with  the exception of two siblings. She had one sister that passed away from  complications secondary to diabetes mellitus.   SOCIAL HISTORY:  The patient is single and she lives in [place]. Her  boyfriend lives with her. She has four children. She is employed at the  [employer]. She smokes one to two packs of cigarettes per  day and has been doing so for approximately 25 years. She smokes marijuana  several times per week. She denies any other illicit drug use. She denies  any intravenous drug use. She occasionally drinks alcohol, only socially on  the weekend.   PHYSICAL EXAMINATION:  VITAL SIGNS:  Pulse 89, respiratory rate 18, blood pressure 145/79, temperature 97.2.  GENERAL:  The patient  is an average sized 55 year old African-American woman  who is currently lying in bed, asleep, and in no acute distress.  HEENT:  Head is normocephalic, atraumatic. Pupils are equal, round, and  reactive to light. Extraocular movements are intact. Conjunctivae are  slightly injected. Sclerae are white. Tympanic membranes are obscured by  cerumen bilaterally.  Nasal mucosa is moist, no drainage. Oropharynx reveals  missing teeth. Mucous membranes are moist. No posterior exudate or erythema.  NECK:  Supple. No adenopathy, no thyromegaly, no bruit.  LUNGS:  The patient has nonlabored breathing. Lungs are essentially clear to  auscultation bilaterally with decreased breath sounds in the bases.  HEART:  S1, S2 with no murmur, rub or gallop.  ABDOMEN:  Positive bowel sounds. Soft, nontender, nondistended, no  hepatosplenomegaly. No masses palpated.  GU/RECTAL:  Deferred.  EXTREMITIES:  The patient has good movement of all of her extremities. No  pretibial edema. Pedal pulses are 2+ bilaterally.  NEUROLOGIC:  The patient was initially lethargic and sleeping, however, she  was arousable and answered questions appropriately. Cranial nerves 2-12 are  intact. Strength is 5/5 throughout in the supine position. Sensation is  intact to soft touch of the upper and lower extremities.  PSYCHOLOGICAL:  The patient has a flat affect. It quickly turned into a  saddened affect once the patient gave the history of her mother's death two  weeks ago. She began to cry, but she was not hysterical. She readily admits  to depression and sadness. She gives no reason for why she discontinued the  Zoloft and the BuSpar in the past for the treatment of depression. She is  currently attending grief therapy at mental health.   ADMISSION LABORATORY DATA:  EKG will need to be repeated. Chest x-ray mild  bilateral perihilar infiltrates. CT scan of the head negative. WBC 6.8,  hemoglobin 13.1, hematocrit 38.7, MCV 93.0,  platelets 255. Urinalysis  greater than 1000 glucose, 15 urine ketones. Urine total protein negative.  Urine leukocyte esterase negative. Urine bacteria rare. Sodium 133,  potassium 2.5, chloride 101, CO2 22, glucose 384, BUN 3, creatinine 0.5,  calcium 9.2, total protein 7.1, albumin 3.7, AST 13, ALT 15, alkaline  phosphatase 105, total bilirubin 0.4. Alcohol 82. Salicylate less than 4.0.  Urine drug screen negative except positive for THC. ABG on 3 L pH 7.306,  pCO2 39.6, pO2 102.   ASSESSMENT:  1. Delirium/mental status change/unresponsiveness.  The differential     diagnoses include multidrug intoxication with alcohol, marijuana, and     clonazepam. Per the patient's history last night was the first time that     she combined clonazepam with alcohol. The patient also admitted to     smoking marijuana. We will also consider psychomotor retardation from    depression. We will also consider hypokalemia contributing to the mental     status change. The patient was not hypoglycemic when she arrived to the     emergency department therefore hypoglycemia is probably the unlikely     cause for her nonresponsiveness. The patient is currently more alert,     more appropriate, and in no acute distress.  2. Hypokalemia. The patient's potassium on admission is 2.5. The patient's     magnesium level will need to be assessed. There is no explanation for why     her potassium would be low with the exception of the nausea and vomiting     that she experienced last night. We will need to rule out hypomagnesemia     as well as renal tubular acidosis.  3. Nausea and vomiting. The cause of the nausea and vomiting is unknown,     however, her symptoms have resolved. The nausea and vomiting could have     been the result of the two mixed drinks containing brandy and a fruit  punch.  4. Depression with grief reaction. The patient is somewhat symptomatic with     depression. She is currently going  through a grief reaction secondary to     her mother's death two weeks ago and a sister's death approximately a     month or two ago. She has sought psychiatric treatment at mental health.     She, however, has not been prescribed an antidepressant. She was     prescribed clonazepam on December 14 and the patient filled the     prescription on December 16. Starting an antidepressant here in the     hospital may be unfruitful given that it takes approximately three to     four weeks for the medication to become effective. We will probably defer     the specific antidepressant to psychiatry.  5. Type 2 diabetes mellitus. The patient's blood sugars appear to be     uncontrolled given that her blood glucose is 384 on admission. The     patient admits not taking her Glucotrol and Actos on a regular basis. The     reason for the noncompliance is depression.   PLAN:  1. The patient was treated in the emergency department with 1 mg of Narcan     IV x1, Thiamine IV x1, and a 1 L bolus of normal saline. She also     received 10 mEq of potassium chloride. She received Tylenol 650 mg p.o.     x1 for her headache.  2. The patient will be admitted to a telemetry bed for closer observation.  3. We will repeat the EKG for assessment of mild chest pain with     palpitations associated with depression. We will also check one set of     cardiac enzymes.  4. We will obtain neurological checks q.4h. x24 hours. We will also instill     fall precautions. The patient will be kept at bed rest for approximately     18-24 hours. A Foley catheter will be inserted for now and we will     discontinue the Foley catheter in the a.m. We will treat the patient's     diabetes with Glucotrol and Actos as previously prescribed. We will also     cover elevated blood sugars with a sliding scale insulin regimen.  5. We will treat nausea and vomiting with Phenergan as needed. We will start    with a full liquid diet and  advance as tolerated.  6. We will assess the patient's TSH, free T4, vitamin B12, and folate. We     will also add a magnesium level and a hemoglobin A1C for further     assessment.  7. The patient will be further repleted with her potassium chloride in her     IV fluids and by mouth.  8. We will strongly consider obtaining a psychiatric consultation while the     patient is here in the hospital.  9. We will obtain a two view chest x-ray to investigate the perihilar     infiltrate. The patient is not febrile and she does not have an elevated     white blood count. A two view will provide a better assessment.  Rexene Alberts, M.D.    DF/MEDQ  D:  06/03/2003  T:  06/03/2003  Job:  [id number]   cc:   Collie Siad Drinkard

## 2010-11-01 NOTE — H&P (Signed)
[name], [name]                           ACCOUNT NO.:  [id number]   MEDICAL RECORD NO.:  [id number]                   PATIENT TYPE:  INP   LOCATION:  1845                                 FACILITY:  [facility]   PHYSICIAN:  Vickki Hearing, M.D.                  DATE OF BIRTH:  07-09-1955   DATE OF ADMISSION:  09/28/2003  DATE OF DISCHARGE:                                HISTORY & PHYSICAL   CHIEF COMPLAINT:  Nausea, vomiting, and leg pain.   SUBJECTIVE:  This is a 55 year old African-American female with a history of  diabetes mellitus type 2, who presents with a 3-week history of polydipsia,  polyuria, and leg pain increasing in frequency.  She does report some nausea  over the past few days with vomiting on one occasion today.  She reports  being discharged from [facility] in February of 2005 with a  diagnosis of viral meningitis.  She did not receive any diabetic medication  prescriptions, and could not afford them even if she had.  She had some  extra Lantus and a few diabetic pills which she took for a few days, but  subsequently stopped, as she ran out of the pills.   PAST MEDICAL HISTORY:  1. Diabetes mellitus.  2. History of alcohol abuse.  3. Status post total abdominal hysterectomy secondary to fibroids.  4. Depressive disorder.   MEDICATIONS:  1. Glucotrol 20 mg p.o. daily.  2. Actos 30 mg p.o. daily.  3. Lantus 12 units subcutaneously q.h.s.   ALLERGIES:  No known drug allergies.   SOCIAL HISTORY:  She currently works as a Quarry manager at [employer].  She is currently single and lives in [place].  She has 4 kids.  She reports smoking 1-1/2 to 1 pack per day x30 years.  She denies any  alcohol.  She used to drink occasionally.  She does report marijuana each  night.  She denies any other drugs.   FAMILY HISTORY:  Her mother died at the age of 21 secondary to heart  stopping.  She recently died in 24-Jun-2003.  Mother also had diabetes  mellitus and dementia.  Her father died at the age of 65 secondary to  diabetes and questionable heart problems.  She had 6 sisters and 5 brothers.  Coronary artery disease and MI's run in the family.  She has a sister  awaiting a CABG.   REVIEW OF SYSTEMS:  She does report an occasional temperature to 101 to 102  and chills every now and then.  She has infrequent sharp chest pains and  respiratory problems consisting of shortness of breath.  She denies any  dysuria or melena or bright red blood per rectum.   PHYSICAL EXAMINATION:  VITAL SIGNS:  Temperature is 99.4, blood pressure  145/72, heart rate 98, respiratory rate 22, O2 saturation 100% on 12 liters  per minute nasal cannula.  GENERAL:  She is pleasant, alert, in no acute distress.  Breathing  comfortably off oxygen in the examining room.  HEENT:  She is normocephalic and atraumatic.  Pupils equal, round and  reactive to light and accommodation.  Extraocular movements intact.  Dry  mucosal membranes.  Her oropharynx is benign, but she has poor dentition.  NECK:  Supple without any lymphadenopathy.  CARDIOVASCULAR:  Regular rate and rhythm with no murmurs.  CHEST:  Clear to auscultation bilaterally without any wheeze, and is non-  labored.  ABDOMEN:  Soft, nontender.  Mildly distended.  Hypoactive bowel sounds.  EXTREMITIES:  Without edema.  She has mild diffuse bilateral lower extremity  tenderness to palpation, which is worse in the posterior calves.  NEUROLOGIC:  She is alert and oriented x3.  Cranial nerves II-XII are  grossly intact.  Strength and sensation are normal.   LABORATORY DATA:  White blood count of 7.8, hemoglobin 12.9, hematocrit  37.7, platelets 246, total protein 6.4, albumin 3.3.  AST 14, ALT 13,  alkaline phosphatase 108, total bilirubin 0.7, direct bilirubin 0.1,  indirect bilirubin 0.6, and lipase of 50.  i-STAT values include a sodium of  132, which corrects to 141, potassium 4.5, chloride 101, bicarb 21,  BUN 9,  creatinine 0.5, glucose 659, pH of 7.282, PCO2 44.7, acid base deficit of  6.0.   ASSESSMENT AND PLAN:  A 55 year old African-American female with a history  of diabetes mellitus type 2, who presents with hyperglycemia.  1. Hyperglycemia, likely secondary to not taking her medications over the     past few weeks.  Will place on Glucomander and give IV fluids.  Will     follow electrolytes, especially her potassium.  Interestingly, she is     slightly acidotic on pH, but her bicarb is normal.  Will check __________     bicarb, but she does not smell ketotic.  2. Acidosis.  Has an anion gap of 18 with corrected sodium.  Glucomander and     IV fluids.  Would expect it to correct fairly quickly.  3. Dehydration secondary to #1.  Will give IV fluids, and allow sips and ice     chips.  4. Bilateral lower extremity pain.  Felt to be musculoskeletal, and probably     secondary to #1.  Will monitor her overnight and reevaluate in the a.m.  5. Depressive disorder.  Has been on SSRI in the past.  The patient states     that she is dealing with her mother's death from June 29, 2003 still.     Will follow, and can add medication/counseling post acute issues     resolved.  6. Tobacco abuse.  Will place patch and encourage smoking cessation.                                                Vickki Hearing, M.D.    AK/MEDQ  D:  09/28/2003  T:  09/28/2003  Job:  [id number]

## 2010-11-01 NOTE — Discharge Summary (Signed)
NAMEANAID, GUTH               ACCOUNT NO.:  [id number]   MEDICAL RECORD NO.:  [id number]          PATIENT TYPE:  INP   LOCATION:  5715                         FACILITY:  [facility]   PHYSICIAN:  Merri Ray. Grandville Silos, M.D.DATE OF BIRTH:  1956-04-20   DATE OF ADMISSION:  08/14/2006  DATE OF DISCHARGE:  08/15/2006                               DISCHARGE SUMMARY   DISCHARGE DIAGNOSES:  1. Scalp abscess posterior to left ear.  2. Status post incision and drainage of scalp abscess.   HISTORY OF PRESENT ILLNESS:  The patient is a 55 year old female who was  admitted to the hospital from our urgent office with a painful scalp  abscess posterior to left ear.   HOSPITAL COURSE:  The patient is brought to the hospital.  She was  brought to the operating room.  She underwent incision and drainage of  the scalp abscess.  Postoperatively, wound care was done.  She received  IV antibiotics and by the next day was changed to oral antibiotics and  had good tolerance of pain medications and she is discharged home in  stable condition.   DISCHARGE DIET:  Regular.   DISCHARGE ACTIVITIES:  As tolerated.   DISCHARGE MEDICATIONS:  Augmentin 875 one p.o. b.i.d.   FOLLOWUP:  In our office in 1 week.      Merri Ray Grandville Silos, M.D.  Electronically Signed     BET/MEDQ  D:  10/01/2006  T:  10/01/2006  Job:  [id number]

## 2010-11-05 ENCOUNTER — Encounter: Payer: Self-pay | Admitting: Family Medicine

## 2010-11-05 ENCOUNTER — Ambulatory Visit (INDEPENDENT_AMBULATORY_CARE_PROVIDER_SITE_OTHER): Payer: Self-pay | Admitting: Family Medicine

## 2010-11-05 VITALS — BP 160/62 | HR 84

## 2010-11-05 DIAGNOSIS — M25519 Pain in unspecified shoulder: Secondary | ICD-10-CM

## 2010-11-05 DIAGNOSIS — M129 Arthropathy, unspecified: Secondary | ICD-10-CM

## 2010-11-05 DIAGNOSIS — H5310 Unspecified subjective visual disturbances: Secondary | ICD-10-CM

## 2010-11-05 DIAGNOSIS — M25549 Pain in joints of unspecified hand: Secondary | ICD-10-CM

## 2010-11-05 NOTE — Assessment & Plan Note (Signed)
1. 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  A. 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    B. Letter written to [facility] regarding patient's limitations.  Suggested that an agreement be attempted regarding mutually acceptable job duties/working conditions.  C. Patient instructed to follow-up with Dr. Ernestina Patches

## 2010-11-05 NOTE — Patient Instructions (Signed)
Information for [facility] Apt with Dr. Ernestina Williams when she received orange card

## 2010-11-05 NOTE — Progress Notes (Signed)
  Subjective:    Patient ID: [name], female    DOB: April 04, 1956, 55 y.o.   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  Pt has worked for facility for 16 years.  The patient has a documented history of arthritis and/or arthropathy in multiple joints, including hands and shoulders: also, see 06/11/10 out-patient rehab report documenting ROM limitation in bilateral shoulders.  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  Despite her modifications to these limitations, the pt reports that if we find she is unable to perform any of her duties (listed on a job description form provided), she may have to use FMLA or perhaps lose her job.    The facility is unable to place on "light duty" unless the pt has been injured on the job.     Review of Systems  Eyes: Positive for visual disturbance.       Reports blurry vision from diabetes complications  Musculoskeletal: Positive for arthralgias. Negative for gait problem.       Limited ROM of bilateral shoulders and finger joints  Also reports generalized aches- documented fibromyalgia       Objective:   Physical Exam  Constitutional: She is  oriented to person, place, and time. She appears well-developed and well-nourished.  HENT:  Head: Normocephalic and atraumatic.  Pulmonary/Chest: Effort normal.  Musculoskeletal:       Right shoulder: She exhibits decreased range of motion.       Left shoulder: She exhibits decreased range of motion.       Right hand: She exhibits decreased range of motion.       Left hand: She exhibits decreased range of motion.       Decreased ability to grip in bilateral hands  Neurological: She is alert and oriented to person, place, and time.  Skin: Skin is warm and dry. No rash noted.  Psychiatric: She has a normal mood and affect.          Assessment & Plan:

## 2010-11-05 NOTE — Assessment & Plan Note (Signed)
1.Adhesive Capsulitis (bilateral shoulders): active problem, limiting pt's ability to perform some work duties per facility policy.  A. Job description/duties reviewed with patient, she confirms she is unable to perform at least two of the duties that are infrequently required of her, but still in job description (lifting patients with draw sheet; document on wall-mounted computer; lift anything above shoulders).    B. Letter written to [facility] regarding patient's limitations.  Suggested that an agreement be attempted regarding mutually acceptable job duties/working conditions.  C. Patient instructed to follow-up with Dr. Ernestina Patches

## 2010-11-05 NOTE — Assessment & Plan Note (Signed)
1.Visual disturbance (subjective): active problem, limiting pt's ability to perform some work duties per facility policy.  A. Job description/duties reviewed with patient, she confirms she is unable to perform at least one of the duties that is frequently required of her (safely shave residents).  Although the pt uses an automated BP monitor, should she have to use a manual one, she would be unable to read the dial.  B. Letter written to [facility] regarding patient's limitations.  Suggested that an agreement be attempted regarding mutually acceptable job duties/working conditions.  C. Patient instructed to follow-up with Dr. Ernestina Patches

## 2010-11-05 NOTE — Assessment & Plan Note (Deleted)
1. 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  A. 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    B. Letter written to [facility] regarding patient's limitations.  Suggested that an agreement be attempted regarding mutually acceptable job duties/working conditions.  C. Patient instructed to follow-up with Dr. Ernestina Patches

## 2010-12-20 ENCOUNTER — Encounter: Payer: Self-pay | Admitting: Family Medicine

## 2011-01-30 ENCOUNTER — Encounter: Payer: Self-pay | Admitting: Family Medicine

## 2011-01-30 ENCOUNTER — Ambulatory Visit (INDEPENDENT_AMBULATORY_CARE_PROVIDER_SITE_OTHER): Payer: Self-pay | Admitting: Family Medicine

## 2011-01-30 DIAGNOSIS — E119 Type 2 diabetes mellitus without complications: Secondary | ICD-10-CM

## 2011-01-30 DIAGNOSIS — F3189 Other bipolar disorder: Secondary | ICD-10-CM

## 2011-01-30 DIAGNOSIS — I10 Essential (primary) hypertension: Secondary | ICD-10-CM

## 2011-01-30 MED ORDER — GLIPIZIDE 10 MG PO TABS: 10.0000 mg | ORAL_TABLET | Freq: Two times a day (BID) | ORAL | Status: DC

## 2011-01-30 MED ORDER — OMEPRAZOLE 40 MG PO CPDR: 40.0000 mg | DELAYED_RELEASE_CAPSULE | Freq: Every day | ORAL | Status: DC

## 2011-01-30 MED ORDER — ASPIRIN 81 MG PO TABS: 81.0000 mg | ORAL_TABLET | Freq: Every day | ORAL | Status: DC

## 2011-01-30 MED ORDER — LISINOPRIL-HYDROCHLOROTHIAZIDE 20-12.5 MG PO TABS: 2.0000 | ORAL_TABLET | Freq: Every day | ORAL | Status: DC

## 2011-01-30 NOTE — Progress Notes (Signed)
  Subjective:    Patient ID: [name], female    DOB: 1955/10/14, 55 y.o.   MRN: [id number]  HPI 61 YOF w/ multpile medical problems here to reestablish care. Pt has not been seen by PCP since for 8 months.   Pt has been off of  Many of her medications medications since last clinical visit.   CBGs: Poorly controlled. On lantus 45 units daily.  Blood sugars in 200s-300s, also with some CBGs into 60s per pt.  + Polyuria, polydypsia.   HTN: Not checking BPs at home. + intermittent HA, SOB,   Mood: Poorly controlled. Hx/o bipolar disorder. + depressed mood. Has been seen at [facility] previously. Pt can recall that she is on abilify for her mood currently. Pt is unsure of dose. Pt unsure of any other medications that she is taking for her mood. Has not been seen for > 1 year. Pt states that she does not go because she has to be seen as a walk in instead of by appointment.  Pt has had intermittent SI. No concrete plan. No SI currently.    Review of Systems See HPI     Objective:   Physical Exam Gen: up in chair, NAD HEENT: NCAT, EOMI, TMs clear bilaterally CV: RRR, no murmurs auscultated PULM: CTAB, no wheezes, rales, rhoncii ABD: S/NT/+ bowel sounds  EXT: 2+ peripheral pulses Assessment & Plan:

## 2011-01-30 NOTE — Patient Instructions (Addendum)
It was good to see you today  I am restarting you on your medications slowly Take the lisinioprill-HCTZ as prescribed  Take the prilosec as prescribed Take the glipizide as prescribed. Be sure to check your blood pressure and blood sugars daily.  Make sure that you go the [facility] in the morning Follow up with me next week Call with any questions, God Bless,  Shanda Howells MD

## 2011-01-31 LAB — BASIC METABOLIC PANEL
BUN: 12 mg/dL (ref 6–23)
CO2: 29 mEq/L (ref 19–32)
Chloride: 96 mEq/L (ref 96–112)
Glucose, Bld: 373 mg/dL — ABNORMAL HIGH (ref 70–99)
Potassium: 4.2 mEq/L (ref 3.5–5.3)

## 2011-02-01 ENCOUNTER — Encounter: Payer: Self-pay | Admitting: Family Medicine

## 2011-02-01 MED ORDER — INSULIN GLARGINE 100 UNIT/ML ~~LOC~~ SOLN: 45.0000 [IU] | Freq: Every day | SUBCUTANEOUS | Status: DC

## 2011-02-01 NOTE — Assessment & Plan Note (Addendum)
Uncontrolled. Will restart pt back on lisinopril-HCTZ for BP control. Will check BMET and UA for proteinuria.

## 2011-02-01 NOTE — Assessment & Plan Note (Addendum)
Will add on low dose glipizide with food to regimen. History of brittle diabetes. Will check UA to assess glucosuria. Will also check A1C. If Cr WNL, will start on metformin. Will continue with current lantus regimen pending A1C.

## 2011-02-01 NOTE — Assessment & Plan Note (Signed)
Poorly controlled. Unsure if pt is currently on a mood stabilizer.  No SI currently. Referred pt back to [facility] center with husband agreeing to take pt in am. Discussed psych red flags for urgent evaluation.

## 2011-02-06 ENCOUNTER — Other Ambulatory Visit: Payer: Self-pay

## 2011-02-06 ENCOUNTER — Encounter: Payer: Self-pay | Admitting: Family Medicine

## 2011-02-06 ENCOUNTER — Ambulatory Visit (HOSPITAL_COMMUNITY)
Admission: RE | Admit: 2011-02-06 | Discharge: 2011-02-06 | Disposition: A | Discharge status: Home / Self Care | Payer: Self-pay | Source: Ambulatory Visit | Attending: Family Medicine | Admitting: Family Medicine

## 2011-02-06 ENCOUNTER — Ambulatory Visit (INDEPENDENT_AMBULATORY_CARE_PROVIDER_SITE_OTHER): Payer: Self-pay | Admitting: Family Medicine

## 2011-02-06 DIAGNOSIS — F319 Bipolar disorder, unspecified: Secondary | ICD-10-CM

## 2011-02-06 DIAGNOSIS — R0602 Shortness of breath: Secondary | ICD-10-CM | POA: Insufficient documentation

## 2011-02-06 DIAGNOSIS — R9431 Abnormal electrocardiogram [ECG] [EKG]: Secondary | ICD-10-CM | POA: Insufficient documentation

## 2011-02-06 DIAGNOSIS — F3189 Other bipolar disorder: Secondary | ICD-10-CM

## 2011-02-06 DIAGNOSIS — R079 Chest pain, unspecified: Secondary | ICD-10-CM | POA: Insufficient documentation

## 2011-02-06 DIAGNOSIS — I10 Essential (primary) hypertension: Secondary | ICD-10-CM

## 2011-02-06 DIAGNOSIS — E119 Type 2 diabetes mellitus without complications: Secondary | ICD-10-CM

## 2011-02-06 MED ORDER — AMLODIPINE BESYLATE 10 MG PO TABS: 10.0000 mg | ORAL_TABLET | Freq: Every day | ORAL | Status: DC

## 2011-02-06 MED ORDER — INSULIN GLARGINE 100 UNIT/ML ~~LOC~~ SOLN: SUBCUTANEOUS | Status: DC

## 2011-02-06 MED ORDER — VENLAFAXINE HCL ER 37.5 MG PO CP24: 37.5000 mg | ORAL_CAPSULE | Freq: Every day | ORAL | Status: DC

## 2011-02-06 MED ORDER — TRAZODONE HCL 100 MG PO TABS: 100.0000 mg | ORAL_TABLET | Freq: Every day | ORAL | Status: DC

## 2011-02-06 MED ORDER — CARVEDILOL 12.5 MG PO TABS: 12.5000 mg | ORAL_TABLET | Freq: Two times a day (BID) | ORAL | Status: DC

## 2011-02-06 MED ORDER — CARVEDILOL 6.25 MG PO TABS: 3.1250 mg | ORAL_TABLET | Freq: Two times a day (BID) | ORAL | Status: DC

## 2011-02-06 MED ORDER — ARIPIPRAZOLE 2 MG PO TABS: 2.0000 mg | ORAL_TABLET | Freq: Every day | ORAL | Status: DC

## 2011-02-06 NOTE — Assessment & Plan Note (Signed)
Improving. Noted A1C approx 11. Will continue with glipizide at current dose. Will increase lantus to 50 units daily. Rx given for pt to get glucometer kit for approx  24 dollars (this was discussed with Jeani Hawking).

## 2011-02-06 NOTE — Assessment & Plan Note (Signed)
Elevated today. Will start pt on norvasc and low dose beta blocker. Will follow up in 1 week.

## 2011-02-06 NOTE — Progress Notes (Signed)
  Subjective:    Patient ID: [name], female    DOB: 04/27/1956, 54 y.o.   MRN: [id number]  HPI Pt is here for follow up of multiple medical problems: Mood: Went to [facility] (now [place] last week as previously encouraged. Pt is now on effexor 37.5, trazodone 100-150 at night, and abilify 2 mg daily. Mood is improved form previous visit "its getting there" per pt. No HI/SI. Pt feels drowsy with medication regimen. No falls per pt. Pt states that she has had some gait instability, that has seemed to worsen with medication regimen. Has had some intermittent  Feet tingling and numbness that has been a longstanding issue. Also with intermittent foot cramping. Husband is driving her around. Pt was instructed by Therapist that drowsiness should improve over 2-3 weeks as pt adjusts to medicine. Has a follow up appt with [facility] 02/21/11 @ 2:30 PM, and 03/05/11 @ 4PM.  HTN:  Pt is not checking BP every day. Was placed on lisinopril-HCTZ 40/25 at most recent visit. Has had intermittent HA. No CP. Pt has had SOB. More so exertional in nature. Also with intermittent orthopnea. No drug use per pt.   DM: Has been checking blood sugars at home intermittently. Has been in mid-200s. Pt states that she broke the glucometer that she has at home. Pt states that she will not be able to get another machine until mid September. Pt states that she can get OTC glucometer. Polyuria has resolved. Still with persistent thirst. Pt states that she has hx/o intolerance to metformin in the past.   Review of Systems See HPI     Objective:   Physical Exam Gen: up in chair, NAD  HEENT: NCAT, EOMI, TMs clear bilaterally  CV: RRR, no murmurs auscultated  PULM: CTAB, no wheezes, rales, rhoncii  ABD: S/NT/+ bowel sounds  EXT: 2+ peripheral pulses, no edema   EKG: NSR, LVH in anteriolateral leads.  Assessment & Plan:

## 2011-02-06 NOTE — Patient Instructions (Addendum)
It was good to see you today I am starting you on norvasc  And low dose coreg for your blood pressure Be sure to check your blood pressure everyday and avoid high salt food  I am increasing your lantus to 50 units; Be sure to check your blood sugars at least 3 times a day Come back to see me in 1 week,  Call with any questions,  God Bless,  Shanda Howells MD

## 2011-02-06 NOTE — Assessment & Plan Note (Signed)
Improved with effexor, trazodone, and abilify. Is now well plugged in with [facility] ([facility]).

## 2011-02-19 ENCOUNTER — Emergency Department (HOSPITAL_COMMUNITY): Payer: Self-pay

## 2011-02-19 ENCOUNTER — Ambulatory Visit: Payer: Self-pay | Admitting: Family Medicine

## 2011-02-19 ENCOUNTER — Emergency Department (HOSPITAL_COMMUNITY)
Admission: EM | Admit: 2011-02-19 | Discharge: 2011-02-19 | Disposition: A | Discharge status: Other Acute Inpatient Hospital | Payer: Self-pay | Attending: Emergency Medicine | Admitting: Emergency Medicine

## 2011-02-19 ENCOUNTER — Inpatient Hospital Stay (HOSPITAL_COMMUNITY)
Admission: AD | Admit: 2011-02-19 | Discharge: 2011-02-21 | DRG: 638 | Disposition: A | Discharge status: Home / Self Care | Payer: Self-pay | Source: Other Acute Inpatient Hospital | Attending: Family Medicine | Admitting: Family Medicine

## 2011-02-19 ENCOUNTER — Inpatient Hospital Stay (HOSPITAL_COMMUNITY): Payer: Self-pay

## 2011-02-19 ENCOUNTER — Encounter: Payer: Self-pay | Admitting: Family Medicine

## 2011-02-19 ENCOUNTER — Encounter (HOSPITAL_COMMUNITY): Payer: Self-pay

## 2011-02-19 DIAGNOSIS — E1169 Type 2 diabetes mellitus with other specified complication: Secondary | ICD-10-CM | POA: Insufficient documentation

## 2011-02-19 DIAGNOSIS — M129 Arthropathy, unspecified: Secondary | ICD-10-CM | POA: Diagnosis present

## 2011-02-19 DIAGNOSIS — E785 Hyperlipidemia, unspecified: Secondary | ICD-10-CM | POA: Diagnosis present

## 2011-02-19 DIAGNOSIS — R680 Hypothermia, not associated with low environmental temperature: Secondary | ICD-10-CM | POA: Insufficient documentation

## 2011-02-19 DIAGNOSIS — E1142 Type 2 diabetes mellitus with diabetic polyneuropathy: Secondary | ICD-10-CM | POA: Diagnosis present

## 2011-02-19 DIAGNOSIS — F3189 Other bipolar disorder: Secondary | ICD-10-CM | POA: Diagnosis present

## 2011-02-19 DIAGNOSIS — E1069 Type 1 diabetes mellitus with other specified complication: Secondary | ICD-10-CM

## 2011-02-19 DIAGNOSIS — F17200 Nicotine dependence, unspecified, uncomplicated: Secondary | ICD-10-CM | POA: Diagnosis present

## 2011-02-19 DIAGNOSIS — R404 Transient alteration of awareness: Secondary | ICD-10-CM | POA: Insufficient documentation

## 2011-02-19 DIAGNOSIS — IMO0001 Reserved for inherently not codable concepts without codable children: Secondary | ICD-10-CM | POA: Diagnosis present

## 2011-02-19 DIAGNOSIS — I10 Essential (primary) hypertension: Secondary | ICD-10-CM | POA: Diagnosis present

## 2011-02-19 DIAGNOSIS — K089 Disorder of teeth and supporting structures, unspecified: Secondary | ICD-10-CM | POA: Diagnosis present

## 2011-02-19 DIAGNOSIS — E1149 Type 2 diabetes mellitus with other diabetic neurological complication: Secondary | ICD-10-CM | POA: Diagnosis present

## 2011-02-19 DIAGNOSIS — N390 Urinary tract infection, site not specified: Secondary | ICD-10-CM

## 2011-02-19 DIAGNOSIS — B952 Enterococcus as the cause of diseases classified elsewhere: Secondary | ICD-10-CM | POA: Diagnosis present

## 2011-02-19 DIAGNOSIS — K219 Gastro-esophageal reflux disease without esophagitis: Secondary | ICD-10-CM | POA: Diagnosis present

## 2011-02-19 DIAGNOSIS — T68XXXA Hypothermia, initial encounter: Secondary | ICD-10-CM

## 2011-02-19 DIAGNOSIS — K3184 Gastroparesis: Secondary | ICD-10-CM | POA: Diagnosis present

## 2011-02-19 DIAGNOSIS — R4182 Altered mental status, unspecified: Secondary | ICD-10-CM

## 2011-02-19 LAB — BLOOD GAS, ARTERIAL
Acid-base deficit: 0.1 mmol/L (ref 0.0–2.0)
Drawn by: 336861
O2 Content: 3 L/min
pCO2 arterial: 43.8 mmHg (ref 35.0–45.0)
pH, Arterial: 7.369 (ref 7.350–7.400)

## 2011-02-19 LAB — URINALYSIS, ROUTINE W REFLEX MICROSCOPIC
Bilirubin Urine: NEGATIVE
Hgb urine dipstick: NEGATIVE
Ketones, ur: NEGATIVE mg/dL
Specific Gravity, Urine: 1.019 (ref 1.005–1.030)
Urobilinogen, UA: 0.2 mg/dL (ref 0.0–1.0)

## 2011-02-19 LAB — DIFFERENTIAL
Basophils Relative: 0 % (ref 0–1)
Lymphocytes Relative: 64 % — ABNORMAL HIGH (ref 12–46)
Monocytes Absolute: 0.6 10*3/uL (ref 0.1–1.0)
Monocytes Relative: 7 % (ref 3–12)
Neutro Abs: 2.2 10*3/uL (ref 1.7–7.7)

## 2011-02-19 LAB — COMPREHENSIVE METABOLIC PANEL
CO2: 27 mEq/L (ref 19–32)
Calcium: 10.5 mg/dL (ref 8.4–10.5)
Creatinine, Ser: 0.71 mg/dL (ref 0.50–1.10)
GFR calc Af Amer: >60 mL/min (ref >60)
GFR calc non Af Amer: >60 mL/min (ref >60)
Glucose, Bld: 27 mg/dL — CL (ref 70–99)

## 2011-02-19 LAB — CBC
HCT: 37.9 % (ref 36.0–46.0)
Hemoglobin: 13.1 g/dL (ref 12.0–15.0)
MCH: 30.5 pg (ref 26.0–34.0)
MCHC: 34.6 g/dL (ref 30.0–36.0)

## 2011-02-19 LAB — POCT I-STAT, CHEM 8
Calcium, Ion: 1.23 mmol/L (ref 1.12–1.32)
Creatinine, Ser: 0.8 mg/dL (ref 0.50–1.10)
Glucose, Bld: 27 mg/dL — CL (ref 70–99)
Hemoglobin: 13.9 g/dL (ref 12.0–15.0)
Potassium: 3.6 mEq/L (ref 3.5–5.1)
TCO2: 25 mmol/L (ref 0–100)

## 2011-02-19 LAB — GLUCOSE, CAPILLARY
Glucose-Capillary: 122 mg/dL — ABNORMAL HIGH (ref 70–99)
Glucose-Capillary: 72 mg/dL (ref 70–99)
Glucose-Capillary: 50 mg/dL — ABNORMAL LOW (ref 70–99)
Glucose-Capillary: 69 mg/dL — ABNORMAL LOW (ref 70–99)

## 2011-02-19 LAB — POCT I-STAT TROPONIN I: Troponin i, poc: 0.06 ng/mL (ref 0.00–0.08)

## 2011-02-19 LAB — RAPID URINE DRUG SCREEN, HOSP PERFORMED
Amphetamines: NOT DETECTED
Cocaine: NOT DETECTED
Opiates: POSITIVE — AB
Tetrahydrocannabinol: POSITIVE — AB

## 2011-02-19 LAB — CARDIAC PANEL(CRET KIN+CKTOT+MB+TROPI): Troponin I: <0.3 ng/mL (ref <0.30)

## 2011-02-19 NOTE — Discharge Summary (Signed)
[facility] Admission History and Physical  Patient name: [name] Medical record number: [id number] Date of birth: [birth year] Age: 55 y.o. Gender: female  Primary Care Provider: Shanda Howells, MD  Chief Complaint: hypoglycemia, altered mental status History of Present Illness: [name] is a 55 y.o. year old female presenting with hypoglycemia from [facility] ED. Patient's husband awoke ~3 am last night to find [place] sitting in a recliner extremely diaphoretic, lethargic and "ice cold". He asked her if her blood sugar was low and she answered yes. Attempted oral OJ and PB crackers to correct, but symptoms remained so he drove pt to ED. CBG was found to be 17 with core temperature of 92 degrees and significant AMS. Administered D50 and started on rewarming. CBGs recovered to mid 100s prior to transfer to [facility].  Patient had recently been increased on her home insulin regimen of Lantus 45 units to 50 units. Several weeks prior to this was started on glipizide BID for symptomatic hyperglycemia.  Denies any recent changes in appetite, fevers, dysuria, SOB, cough, skin rash, ulcers. She does endorse a tooth ache in right bottom for the past week causing significant pain, but does not have insurance so cannot see a dentist.    Patient Active Problem List  Diagnoses  . DIABETES MELLITUS, TYPE II  . DIABETIC PERIPHERAL NEUROPATHY  . HYPERLIPIDEMIA  . BIPOLAR II DISORDER  . TOBACCO USE  . TENSION HEADACHE  . DEPRESSIVE DISORDER, NOS  . UNSPECIFIED SUBJECTIVE VISUAL DISTURBANCE  . GASTROESOPHAGEAL REFLUX, NO ESOPHAGITIS  . GASTROPARESIS  . VAGINAL PRURITUS  . DYSTROPHIC NAILS  . UNSPECIFIED ARTHROPATHY MULTIPLE SITES  . Adhesive capsulitis  . PAIN IN JOINT, HAND  . Trigger finger (acquired)  . DE QUERVAIN'S TENOSYNOVITIS  . FIBROMYALGIA  . INSOMNIA NOS  . HYPERTENSION, BENIGN ESSENTIAL   Past Medical History: Past Medical History  Diagnosis Date  . DM  (diabetes mellitus), type 2   . Hypercholesteremia   . HTN (hypertension)   . Trigger finger     r index inj 12/08, bilateral thumb inj 11/09, r thumb and dequervain's inj 01/2009  . Bipolar 2 disorder   . Marijuana abuse     Past Surgical History: Past Surgical History  Procedure Date  . Abdominal hysterectomy 1986    fibroids  . Cardiac catheterization 05/06/2004    normal LV fxn EF>60%  . Perirpheral iridectomy 02/12/2005    Social History: History   Social History  . Marital Status: Married    Spouse Name: N/A    Number of Children: N/A  . Years of Education: N/A   Occupational History  . 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 Smoking status: Current Everyday Smoker -- 0.5 packs/day for 30 years    Types: Cigarettes  . Smokeless tobacco: Not on file  . Alcohol Use: Not on file  . Drug Use: 7 per week    Special: Marijuana  . Sexually Active: Yes -- Female partner(s)   Other Topics Concern  . Not on file   Social History Narrative   Finances are a major issue. Meeting with [place]. Rejected by healthcare sharing initiativecurrently receiving med assistance for lantus through our office and [employer]rica.Lives with 5 grandkids8/2012: recetntly loss position as CNA secondary to poor functionality from recurrent adhesive capsulitis, duquervains, and hypeglycemia. Pt in process of obtaining disability.     Family History: Family History  Problem Relation Age of Onset  . Diabetes Mother   . Heart disease Mother   . Heart attack Father   . Bipolar disorder Father   . Bipolar disorder Sister     Allergies: Allergies  Allergen Reactions  . Ibuprofen     REACTION: chest tightness and feeling like something stuck in chest    No current outpatient prescriptions on file.   Review Of Systems: Per HPI with the following additions: Has recent visual decline and c/o pain all over chronically. ALso has intermittent pains in  central chest that are not new.  Otherwise 12 point review of systems was performed and was unremarkable.  Physical Exam: Pulse: 88  Blood Pressure: 120/57  RR: 17   O2: 100 on *2L Temp: 36.5 (97.7)  General: fatigued, no distress and slowed mentation HEENT: PERRLA, extra ocular movement intact, sclera clear, anicteric, oropharynx clear, no lesions and right bottom incisor with some cavitation. No overt abscess or erythema. Heart: S1, S2 normal, no murmur, rub or gallop, regular rate and rhythm Lungs: unlabored breathing and rales to bilateral mid-lung fields Abdomen: abdomen is soft without significant tenderness, masses, organomegaly or guarding Extremities: extremities normal, atraumatic, no cyanosis or edema Skin:no rashes Neurology: normal without focal findings, PERLA, cranial nerves 2-12 intact, muscle tone and strength normal and symmetric, sensation grossly normal and lethargic but oriented x4  Labs and Imaging: Lab Results  Component Value Date/Time   NA 142 02/19/2011  2:38 AM   K 3.6 02/19/2011  2:38 AM   CL 107 02/19/2011  2:38 AM   CO2 27 02/19/2011  2:24 AM   BUN 23 02/19/2011  2:38 AM   CREATININE 0.80 02/19/2011  2:38 AM   CREATININE 0.81 01/30/2011  3:03 PM   GLUCOSE 27* 02/19/2011  2:38 AM   Lab Results  Component Value Date   WBC 8.3 02/19/2011   HGB 13.9 02/19/2011   HCT 41.0 02/19/2011   MCV 88.1 02/19/2011   PLT 327 02/19/2011     Assessment and Plan: CATERIN PLACIDE is a 55 y.o. year old female presenting with AMS secondary to hypoglycemia and hypothermia. 1. Hypoglycemia. MOst likely iatrogenic due to BID glipizide and increase in basal lantus as this has been coming on for several days. Patient still somewhat lethargic but improved mentation with resolution of CBG.  CBG now 150-200s after administration of D50. Will start D51/2NS at 150cc/hr for maintenance fluid to avoid repeated hypoglycemic episode. Will cover hyperglycemia with sensitive SSI and will need to restart lower  dose basal insulin later tonight or tomorrow. WIll DC glipizide. Check CBG q2hxc4 then QID/achs.  2. Hyperthermia. Likely secondary to autonomic response of long term hypoglycemia. Temperature resolved. WIll check PA/Lat CXR and blood cultures to evaluate for occult infectious etiology.  3. Tooth pain. Will consider initiating penicillin if CXR is negative to cover for possible infectious source. Patient will benefit from dental evaluation in the future but has no insurance.  4. FEN/GI: Start clears and advance to [facility] as tolerated. SSI and CBGs as above. Daily PPI. F/u am electrolyes.  5. Prophylaxis: Start heparin sq for VTE ppx. 6. Disposition: Pending clinical improvement.

## 2011-02-20 ENCOUNTER — Inpatient Hospital Stay (HOSPITAL_COMMUNITY): Payer: Self-pay

## 2011-02-20 LAB — GLUCOSE, CAPILLARY
Glucose-Capillary: 124 mg/dL — ABNORMAL HIGH (ref 70–99)
Glucose-Capillary: 130 mg/dL — ABNORMAL HIGH (ref 70–99)
Glucose-Capillary: 132 mg/dL — ABNORMAL HIGH (ref 70–99)
Glucose-Capillary: 153 mg/dL — ABNORMAL HIGH (ref 70–99)
Glucose-Capillary: 189 mg/dL — ABNORMAL HIGH (ref 70–99)
Glucose-Capillary: 204 mg/dL — ABNORMAL HIGH (ref 70–99)
Glucose-Capillary: 241 mg/dL — ABNORMAL HIGH (ref 70–99)

## 2011-02-20 LAB — BASIC METABOLIC PANEL
BUN: 4 mg/dL — ABNORMAL LOW (ref 6–23)
Calcium: 8.7 mg/dL (ref 8.4–10.5)
Chloride: 104 mEq/L (ref 96–112)
Creatinine, Ser: <0.47 mg/dL — ABNORMAL LOW (ref 0.50–1.10)

## 2011-02-20 LAB — CBC
HCT: 29.6 % — ABNORMAL LOW (ref 36.0–46.0)
MCH: 29.8 pg (ref 26.0–34.0)
MCHC: 33.8 g/dL (ref 30.0–36.0)
MCV: 88.1 fL (ref 78.0–100.0)
RDW: 14.3 % (ref 11.5–15.5)

## 2011-02-21 ENCOUNTER — Encounter: Payer: Self-pay | Admitting: Family Medicine

## 2011-02-21 LAB — URINE CULTURE
Colony Count: >=100000
Culture  Setup Time: 201209051106

## 2011-02-21 LAB — CBC
MCH: 29.2 pg (ref 26.0–34.0)
MCV: 87.7 fL (ref 78.0–100.0)
Platelets: 260 10*3/uL (ref 150–400)
RDW: 14.1 % (ref 11.5–15.5)

## 2011-02-21 LAB — GLUCOSE, CAPILLARY
Glucose-Capillary: 217 mg/dL — ABNORMAL HIGH (ref 70–99)
Glucose-Capillary: 194 mg/dL — ABNORMAL HIGH (ref 70–99)

## 2011-02-21 LAB — BASIC METABOLIC PANEL
Calcium: 9.5 mg/dL (ref 8.4–10.5)
Creatinine, Ser: 0.51 mg/dL (ref 0.50–1.10)
GFR calc Af Amer: >60 mL/min (ref >60)

## 2011-02-21 NOTE — Discharge Summary (Signed)
Physician Discharge Summary  Patient ID: [name] MRN: [id number] DOB/AGE: 1955-11-16 55 y.o.  Admit date: 02/19/2011 Discharge date: 02/21/2011  Admission Diagnoses: Hypoglycemia, Hypothermia DM, HTN, Tooth Pain, Marijuana Use  Discharge Diagnoses:  DM, UTI, Tooth ache, Depression  Discharged Condition: Improved  Hospital Course: Pt is a 55 yo F admitted with AMS secondary to hypoglycemia and hypothermia 1. Hypoglycemia: Thought to be secondary to recent Dm regimen changes. Pt was started on D51/2NS fluids and covered with SSI throughout the hospital admission. Glipizide was stopped. Once her blood sugars were consistent and she was eating well, her D5 was stopped. Pt will be discharged home on Launtus 25 units (a decrease from her 50 units) with a plan to start rapid acting insulin with meals as well. At this time, pt cannot afford Novolog but she understands why she needs it. Therefore, we will wait until she sees Dr. Ernestina Patches in the clinic who will start this with every meal and hopefully be able to give her samples in the clinic for this. At discharge, pt had received DM education and was willing to meet with Dr. Valentina Lucks as an outpatient as well. 2. Hypothermia: Autonomic response to hypoglycemia. Improved. 3. UTI: Pt was found to have an Enterococcus UTI which was susceptible to Ampicillin. Pt is asymptomatic. Started on Amox 500mg  TID x 7 days. 4. Tooth ache: Pt has a broken tooth on right bottom side. Started on PCN while in patient, and Amox she is being discharged home on will cover this as well. SW met with her to discuss dental options. 5. Social: Pt does not have insurance and she is awaiting disability. SW spoke with her extensively about how to get hooked up in the system and pt states she will work on this. 6. Depression: On Abilify, Effexor and Trazadone at admission. Effexor had unwanted sexual side effects and Trazadone made her groggy. Therefore, both of those were  discontinued and she was started on Remeron qhs for depression, sleep and appetite. Pt tolerated this well while she was in the hospital.  Consults: Diabetes educator; Social work  Significant Diagnostic Studies: none  Treatments: insulin:SSI  Discharge Exam: There were no vitals taken for this visit. WNL  Disposition: [facility]  Discharge Orders    Future Appointments: Provider: Department: Dept Phone: Center:   02/28/2011 9:30 AM Shanda Howells [facility] [phone] [facility]   03/04/2011 9:15 AM Leavy Cella, PHARMD [facility] [phone] [facility]     New Medications: Lantus 5U every morning; Nicotine Patches; Amox 500mg  TID x 7 days; Mirtazipine 15mg  qhs Stop taking: Trazadone, Effexor, Glipizide    Signed: Nan Maya 02/21/2011, 3:12 PM

## 2011-02-25 LAB — CULTURE, BLOOD (ROUTINE X 2)
Culture  Setup Time: 201209052232
Culture: NO GROWTH

## 2011-02-28 ENCOUNTER — Ambulatory Visit (INDEPENDENT_AMBULATORY_CARE_PROVIDER_SITE_OTHER): Payer: Self-pay | Admitting: Family Medicine

## 2011-02-28 ENCOUNTER — Encounter: Payer: Self-pay | Admitting: Family Medicine

## 2011-02-28 VITALS — BP 145/87 | HR 99 | Temp 97.8°F | Ht 62.0 in | Wt 136.2 lb

## 2011-02-28 DIAGNOSIS — E119 Type 2 diabetes mellitus without complications: Secondary | ICD-10-CM

## 2011-02-28 DIAGNOSIS — I10 Essential (primary) hypertension: Secondary | ICD-10-CM

## 2011-02-28 DIAGNOSIS — Z23 Encounter for immunization: Secondary | ICD-10-CM

## 2011-02-28 DIAGNOSIS — F329 Major depressive disorder, single episode, unspecified: Secondary | ICD-10-CM

## 2011-02-28 LAB — BASIC METABOLIC PANEL
BUN: 26 mg/dL — ABNORMAL HIGH (ref 6–23)
CO2: 26 mEq/L (ref 19–32)
Calcium: 10 mg/dL (ref 8.4–10.5)
Chloride: 99 mEq/L (ref 96–112)
Creat: 0.97 mg/dL (ref 0.50–1.10)
Glucose, Bld: 268 mg/dL — ABNORMAL HIGH (ref 70–99)
Potassium: 5.4 mEq/L — ABNORMAL HIGH (ref 3.5–5.3)
Sodium: 136 mEq/L (ref 135–145)

## 2011-02-28 NOTE — Patient Instructions (Signed)
It was good to see you today I am starting you on novolog; 3 units with your largest meal. Take this AFTER your largest meal of the day Try eating less salty food Keep recording in your blood pressure and blood sugar logs. GOOD JOB!!!! I will also draw some lab work today,. Come back to see me in 1 week Call with any questions God Bless, Shanda Howells MD

## 2011-03-01 NOTE — Progress Notes (Signed)
  Subjective:    Patient ID: [name], female    DOB: [birth year], 55 y.o.   MRN: [id number]  HPI Pt is here for recent hospital follow for medication induced hypoglycemia in setting of type 2 DM. Pt was discharged on lantus 25 units (pt previously on lantus 45 and glipizide 10 bid w/ meals with recent A1C >11).   DM: Today, pt states that she feels much better that previous. No episodes of hypoglycemia or hypothermia since hospital discharge.  Blood sugars have been running in 130s-250s. Has still had some polyuria and polydypsia. Pt with previous hx/o skipping meals, which was likely nidus for episodes of hyoglycemia. Pt states that her appetite has also improved since being placed on remeron for mood.   HTN: Pt has been checking BPs on a daily basis. SBPs mainly in 150s. No CP. Has had some intermittent HAs and SOB. No orthopnea, or peripheral edema per pt.   Mood: Has been stable since hospital discharge. Pt previously on abilify and effexor per mental health. Pt was recently changed to remeron for depressed mood. Remeron was also added to help with appetite as there was a question of pt's diet (missing meals) in setting of brittle diabetes. Pt states that appetie has also improved.  Review of Systems See HPI     Objective:   Physical Exam Gen: up in chair, NAD HEENT: NCAT, EOMI, TMs clear bilaterally CV: RRR, no murmurs auscultated PULM: CTAB, no wheezes, rales, rhoncii ABD: S/NT/+ bowel sounds  EXT: 2+ peripheral pulses Neuro: CN II-XII grossly intact.    Assessment & Plan:

## 2011-03-02 ENCOUNTER — Encounter: Payer: Self-pay | Admitting: Family Medicine

## 2011-03-02 MED ORDER — INSULIN GLARGINE 100 UNIT/ML ~~LOC~~ SOLN: 25.0000 [IU] | Freq: Every day | SUBCUTANEOUS | Status: DC

## 2011-03-02 MED ORDER — MIRTAZAPINE 15 MG PO TABS: 15.0000 mg | ORAL_TABLET | Freq: Every day | ORAL | Status: DC

## 2011-03-02 NOTE — Assessment & Plan Note (Addendum)
Blood sugars elevated, but no noted episodes of hypoglycemia which is reassuring. Will start pt on lantus 3 units after largest meal. It was stressed with pt to avoid using insulin without eating as this is the likely cause of her episodes of hypoglycemia. Will follow up in 1 week. Pt will need extremly close follow up with weekly visits over the coming months given her multiple comorbidities. Pt agreeable to plan.

## 2011-03-02 NOTE — Assessment & Plan Note (Signed)
Improved on current regimen. No HI/SI. Will continue to follow in conjunction with mental health.

## 2011-03-02 NOTE — Assessment & Plan Note (Signed)
Stable on current regimen. Encouraged continued monitoring and low sodium diet. Will follow up in 1 week.

## 2011-03-03 ENCOUNTER — Ambulatory Visit (HOSPITAL_COMMUNITY)
Admission: RE | Admit: 2011-03-03 | Discharge: 2011-03-03 | Disposition: A | Discharge status: Home / Self Care | Payer: Self-pay | Source: Ambulatory Visit | Attending: Family Medicine | Admitting: Family Medicine

## 2011-03-03 ENCOUNTER — Telehealth: Payer: Self-pay | Admitting: Family Medicine

## 2011-03-03 ENCOUNTER — Other Ambulatory Visit: Payer: Self-pay

## 2011-03-03 ENCOUNTER — Ambulatory Visit (INDEPENDENT_AMBULATORY_CARE_PROVIDER_SITE_OTHER): Payer: Self-pay | Admitting: *Deleted

## 2011-03-03 DIAGNOSIS — R9431 Abnormal electrocardiogram [ECG] [EKG]: Secondary | ICD-10-CM | POA: Insufficient documentation

## 2011-03-03 DIAGNOSIS — E875 Hyperkalemia: Secondary | ICD-10-CM

## 2011-03-03 DIAGNOSIS — I10 Essential (primary) hypertension: Secondary | ICD-10-CM

## 2011-03-03 LAB — BASIC METABOLIC PANEL
BUN: 20 mg/dL (ref 6–23)
Calcium: 10.6 mg/dL — ABNORMAL HIGH (ref 8.4–10.5)
Potassium: 4.4 mEq/L (ref 3.5–5.3)
Sodium: 138 mEq/L (ref 135–145)

## 2011-03-03 NOTE — Progress Notes (Signed)
Bmp done today ,sent STAT per DR.Arna Medici Shawnta Schlegel

## 2011-03-03 NOTE — Patient Instructions (Signed)
If the chest pain gets worse, please go to the emergency room. You might buy ranitidine over the counter.  You can take 150 mg at a time.  Do not take at the same time as the prilosec. Your EKG looks fine now.

## 2011-03-03 NOTE — Telephone Encounter (Signed)
Called pt and put on the lab schedule for today. We will also do EKG, after her labs. Javier Glazier, Gerrit Heck

## 2011-03-03 NOTE — Progress Notes (Signed)
  Subjective:    Patient ID: [name], female    DOB: Jun 10, 1956, 55 y.o.   MRN: [id number]  HPI 55 yo with DM2, tobacco abuse, HTN and hyperlipidemia, noted to have K = 5.4 sent in for EKG and repeat labs.  She is a brittle diabetic with recent admission for hypoglycemia.  She does endorse some chest pain for years, sharp pain that lasts seconds.  Worse with eating, sometimes feels like pressure.  Currently taking OTC prilosec without relief.       Review of Systems  SEe HPI     Objective:   Physical ExamWNWD female.  No distress. CV:RRR with 1/6 systolic murmur. EKG reviewed.  No ST elevation or depression, no peaked T waves.        Assessment & Plan:  Pt with cardiac risk factors, but chest pain chronic and feels like GERD. Will add ranitidine to regimen (pt has tried before, does not take consistently) and given red flags to return or go to ED.  Follow up tomorrow with Dr. Valentina Lucks for diabetes management and Friday with Dr. Ernestina Patches.  Stat labs pending.

## 2011-03-03 NOTE — Progress Notes (Signed)
  Subjective:    Patient ID: [name], female    DOB: Sep 16, 1955, 55 y.o.   MRN: [id number]  HPI    Review of Systems     Objective:   Physical Exam        Assessment & Plan:

## 2011-03-03 NOTE — Telephone Encounter (Signed)
Called pt to follow up on BMET and noted K @ 5.4. Pt currently asymptomatic. Instructed pt to come in for repeat lab draw today as well as EKG to rule out cardiac effects of K. Instructed pt that I will call her when results are in.

## 2011-03-03 NOTE — Discharge Summary (Signed)
NAME:  [name], [name] NO.:  [id number]  MEDICAL RECORD NO.:  [id number]  LOCATION:  2316                         FACILITY:  [facility]  PHYSICIAN:  Dickie La, MD        DATE OF BIRTH:  16-Mar-1956  DATE OF ADMISSION:  02/19/2011 DATE OF DISCHARGE:  02/21/2011                              DISCHARGE SUMMARY   PRIMARY CARE PHYSICIAN:  Shanda Howells, MD at [facility].  ADMISSION DIAGNOSES: 1. Hypoglycemia. 2. Hypothermia. 3. Diabetes. 4. Hypertension. 5. Tooth pain. 6. Marijuana use.  DISCHARGE DIAGNOSES: 1. Diabetes. 2. Urinary tract infection. 3. Tooth ache. 4. Depression.  DISCHARGE CONDITION:  Improved.  MEDICATIONS AT DISCHARGE:  New medications, 1. Amoxicillin 500 mg p.o. t.i.d. until February 27, 2011, for     urinary tract infection and toothache. 2. Remeron 15 mg p.o. at bedtime. 3. Nicotine 14 mg/24 hour patch, use one patch daily.  Change medications, Lantus changed from 50 units to 25 units daily every morning before breakfast.  Home medications to continue, 1. Abilify 2 mg p.o. daily. 2. Aleve 220 mg p.o. every 6 hours as needed for pain. 3. Amlodipine 10 mg daily. 4. Carvedilol 12.5 mg by mouth b.i.d. 5. Lisinopril/hydrochlorothiazide 20/12.5 mg p.o. daily. 6. Acetaminophen 500 mg one tablet every 8 hours as needed for pain.  Medications to stop taking, 1. Glipizide 10 mg p.o. b.i.d. 2. Effexor XR 37.5 mg p.o. daily. 3. Trazodone 100 mg one tab daily.  BRIEF HOSPITAL COURSE:  The patient is a 55 year old female admitted with altered mental status secondary to hypoglycemia and hypothermia. 1. Hypoglycemia thought to be secondary to recent diabetes regimen     changes.  The patient was started on D5 half normal saline fluids     and covered with sliding scale insulin throughout hospital course.     Glipizide was recently added as an outpatient and that was stopped.     Once her blood sugars were consistent and she  was eating well, her     D5 IV fluids were stopped.  The patient was discharged home on Lantus 25     units, this is a decrease from her usual 50 units, with a plan to     start rapid-acting insulin with meals as well.  At this time, the     patient cannot afford NovoLog but she understands why she needs     this.  Therefore, we will await until she sees Dr. Shanda Howells in     the clinic who will start this with every meal and hopefully will     be able to access samples.  At discharge, the patient had received     diabetes education and was willing to meet with Dr. Alinda Dooms for further diabetes medication management as     an outpatient as well. 2. Hyporthermia felt to be an autonomic response to the hypoglycemia;     improved. 3. Urinary tract infection.  The patient was found to have an     enterococcus UTI which is susceptible to ampicillin.  The patient     is asymptomatic.  She was started on amoxicillin 500 mg  t.i.d. for     7 days. 4. Tooth ache.  The patient has a broken tooth on the right bottom     side.  The patient was started on penicillin while inpatient and     discharge home on amoxicillin.  Social work met with her to discuss     potential option. 5. Social.  The patient does not have insurance and she is awaiting     disability.  Social work spoke with her extensively on how to get     into the system and the patient states understanding if that she     will work on this. 6. Depression.  At admission, the patient was on Abilify, Effexor, and     trazodone.  Effexor has unwanted sexual side effects for her and     trazodone made her groggy, therefore both of these were     discontinued and she was started on Remeron every night for     depression, sleep, and this will hopefully increase her appetite as     well.  The patient tolerated this change while she was in the     hospital.  [place]:  The patient has appointment with Dr. Shanda Howells at [facility] on February 28, 2011, at 9:30 a.m.  The patient has appointment with Dr. Alinda Dooms at [facility] on March 04, 2011, at 9:15 a.m. and the patient will call mental health to make her followup appointment.  Please continue to monitor her blood sugar as she is only on insulin at this time.  She will need NovoLog or Humalog for mealtime coverage, also monitor for the recent changes in her depression medication.  DISCHARGE CONDITION:  The patient was discharged home in stable medical condition with first PCP followup.    ______________________________ Melrose Nakayama, MD   ______________________________ Dickie La, MD    AH/MEDQ  D:  02/25/2011  T:  02/25/2011  Job:  [id number]  Electronically Signed by Melrose Nakayama  on 02/26/2011 10:04:58 AM Electronically Signed by Dorcas Mcmurray MD on 03/03/2011 10:21:32 AM

## 2011-03-04 ENCOUNTER — Encounter: Payer: Self-pay | Admitting: Pharmacist

## 2011-03-04 ENCOUNTER — Ambulatory Visit (INDEPENDENT_AMBULATORY_CARE_PROVIDER_SITE_OTHER): Payer: Self-pay | Admitting: Pharmacist

## 2011-03-04 DIAGNOSIS — E119 Type 2 diabetes mellitus without complications: Secondary | ICD-10-CM

## 2011-03-04 NOTE — Progress Notes (Signed)
  Subjective:    Patient ID: [name], female    DOB: 1956/02/03, 55 y.o.   MRN: [id number]  HPI Reviewed and agree with Dr. Graylin Shiver management.    Review of Systems     Objective:   Physical Exam        Assessment & Plan:

## 2011-03-04 NOTE — Progress Notes (Signed)
  Subjective:    Patient ID: [name], female    DOB: December 21, 1955, 55 y.o.   MRN: [id number]  HPI  Pt presents to pharmacy clinic in good spirits for discussion of diet for diabetes control, states that she was diagnosed with T2DM in the 1980's. Patient endorses little to no appetite in the morning and generally has to wait 1-2 hours before she can make herself eat breakfast without "bringing it back up." She generally eats 1-3 meals per day. Patient states that she feels groggy and fatigued most mornings and wakes most days with blurry vision. Her eyesight and disposition improve over the course of the day. Patient endorses polyuria (getting worse with addition of HCTZ), but denies nocturia. She endorses the loss of sensation in both of her feet and states that sometimes she is not aware/can't feel if her shoes are on or off. Pt brought in BG log with many AM readings in the 300s and only 1 reading at less than 100 (93) over the past 3 weeks.  Review of Systems     Objective:   Physical Exam        Assessment & Plan:   Diabetes since 1980s currently under poor control of blood glucose based on patient reported BG log Lab Results  Component Value Date   HGBA1C 11.5 01/30/2011  home fasting CBG readings ranging from 100s- high 300s. Control is suboptimal due to inconsistent diet, limited exercise, and re-initiation of insulin regimen that is not yet optimized. Endorses hypoglycemic events and was hospitalized 02/19/11 d/t hypoglycemia and hypothermia.  Able to verbalize appropriate hypoglycemia management plan. Decreased dose of basal insulin Lantus (insulin glargine) to 20 units daily. Increased dose of rapid insulin Novolog (insulin aspart) to 3 units SQ with each meal.  Written patient instructions provided.  Follow up in  Pharmacist Clinic Visit  In 3 weeks.   TTFFC 30 minutes.  Patient seen with Johny Drilling, PharmD Candidate and Woodroe Chen, Pharmacy Resident .

## 2011-03-04 NOTE — Patient Instructions (Signed)
1) Decrease Lantus dose to 20 units daily 2) Inject 3 units of Novolog with each meal (or immediately right after you are finished eating). 3) Bring your blood glucose meter with you to your next visit. 4) Call back tomorrow to reschedule your doctor's visit and to make an appointment in the pharmacy clinic in about 3 weeks.

## 2011-03-04 NOTE — Assessment & Plan Note (Signed)
Diabetes since 1980s currently under poor control of blood glucose based on patient reported BG log Lab Results  Component Value Date   HGBA1C 11.5 01/30/2011  home fasting CBG readings ranging from 100s- high 300s. Control is suboptimal due to inconsistent diet, limited exercise, and re-initiation of insulin regimen that is not yet optimized. Endorses hypoglycemic events and was hospitalized 02/19/11 d/t hypoglycemia and hypothermia.  Able to verbalize appropriate hypoglycemia management plan. Decreased dose of basal insulin Lantus (insulin glargine) to 20 units daily. Increased dose of rapid insulin Novolog (insulin aspart) to 3 units SQ with each meal.  Written patient instructions provided.  Follow up in  Pharmacist Clinic Visit  In 3 weeks.   TTFFC 30 minutes.  Patient seen with Johny Drilling, PharmD Candidate and Woodroe Chen, Pharmacy Resident

## 2011-03-07 ENCOUNTER — Ambulatory Visit: Payer: Self-pay | Admitting: Family Medicine

## 2011-03-12 NOTE — H&P (Signed)
NAME:  GINGER, [name] NO.:  [id number]  MEDICAL RECORD NO.:  [id number]  LOCATION:  2316                         FACILITY:  [facility]  PHYSICIAN:  Mercedes La, MD        DATE OF BIRTH:  [birth year]  DATE OF ADMISSION:  02/19/2011 DATE OF DISCHARGE:                             HISTORY & PHYSICAL   PRIMARY CARE PHYSICIAN:  Mercedes Howells, MD.  CHIEF COMPLAINT:  Hypoglycemia and altered mental status.  HISTORY OF PRESENT ILLNESS:  [name] is a 55 year old female with a history of uncontrolled diabetes and secondary manifestations, who presented with altered mental status and hypoglycemia.  According to her husband, he woke around 3am last night and find [name] sitting in a recliner with extreme diaphoretic appearance, lethargic, and "ice cold." He asked her if her blood sugar was low and she answered yes.  They attempted oral correction with OJ and peanut butter crackers, however, her symptoms were not improved. The patient's husband drove her immediately to the emergency department where her CBG was found to be 17 and core temperature of 92 degrees with significant lethargy and altered mental status.  In the ED, she was administered D50 and started on rewarming protocol with correction of her CBGs to the mid 100s.  The patient was then transferred to [facility] for admission.  Notably, the patient had recently had her home Lantus dose increased from 45 to 50 units approximately 1 week prior to admission.  Several weeks prior to this, she was also started on glipizide b.i.d. for symptomatic hyperglycemia and A1c of greater than 11.  The patient denies any recent changes in her appetite, fevers, dysuria, shortness of breath, cough, skin rash, ulcers, or symptoms of infection except for a tooth ache in her bottom right mouth notable for the past week, causing significant pain, but does not have insurance to see a dentist.  MEDICATIONS: 1.  Norvasc 10 mg p.o. daily. 2. Abilify 2 mg p.o. daily. 3. Aspirin 81 mg p.o. daily. 4. Coreg 3.125 mg p.o. b.i.d. 5. Glipizide 10 mg p.o. b.i.d. 6. Lantus 50 units subcu at bedtime. 7. Lisinopril/HCTZ 20/12.5 mg two tablets p.o. daily. 8. Reglan 10 mg p.o. q.a.c. and at bedtime. 9. Nitroglycerin sublingual p.r.n. 10.Prilosec 40 mg p.o. daily. 11.Trazodone 100 mg p.o. at bedtime. 12.Effexor 37.5 mg p.o. daily.  PAST MEDICAL HISTORY: 1. Type 2 diabetes. 2. Diabetic peripheral neuropathy. 3. Hyperlipidemia. 4. Bipolar II disorder. 5. Tobacco abuse. 6. Tension headaches. 7. Depressive disorder. 8. Gastroesophageal reflux disease. 9. Gastroparesis. 10.Vaginal pruritus. 11.Arthritis. 12.Adhesive capsulitis. 13.Trigger finger. 14.Fibromyalgia. 15.Hypertension.  PAST SURGICAL HISTORY: 1. Abdominal hysterectomy. 2. Cardiac cath in 2005 showing EF greater than 60%. 3. Peripheral iridectomy in 2006.  SOCIAL HISTORY:  The patient is married, works as a [employer]ar[employer] manager, smoke 0.5 packs of cigarettes per day x30 years, uses marijuana daily.  Denies alcohol use and does not have insurance.  FAMILY HISTORY:  Diabetes and heart disease in mother.  MI and bipolar disorder in father and sister with bipolar disorder.  ALLERGIES:  MOTRIN.  REVIEW OF SYSTEMS:  See HPI, otherwise does endorse recent visual decline and complains of chronic pain all  over her body, also has intermittent chest pain that are not new onset.  Otherwise, review of systems is negative.  PHYSICAL EXAM:  VITAL SIGNS:  Blood pressure 120/57, pulse 88, respirations 17, O2 100% on 2 liters, temperature 36.5 degrees celsius or 97.7 degrees Fahrenheit. GENERAL APPEARANCE:  Fatigued, lethargic, and slow mentation, but no apparent distress and is oriented x4. HEENT:  PERRLA.  EOMI.  Oropharynx clear.  No lesions noted.  Right bottom incisor with some cavitation, but no overt abscess or erythema. CARDIOVASCULAR:  Regular rate and  rhythm.  No murmurs, rubs, or gallops. LUNGS:  Nonlabored breathing and rales noted bilaterally to mid lung field.  No wheezes. ABDOMEN:  Soft.  No significant tenderness, masses, guarding.  Bowel sounds are present. EXTREMITIES:  Normal.  No edema or cyanosis. NEUROLOGIC:  Cranial nerves II-XII grossly intact.  Muscle tone symmetric and oriented x4.  Speech was intact but slowed.  LABS AND STUDIES: 1. Sodium 142, potassium 3.6, chloride 107, bicarb 27, BUN 23,     creatinine 0.80, glucose 27, total protein of 1, albumin 4.5, AST     22, ALT 16, alk phos 109, total bili 0.2. 2. CBC:  White blood count 8.3, hemoglobin 13.1, hematocrit 37.9,     platelets 327.  UA, spec gravity 1.019, 500 glucose, negative     ketones. 3. Lactic acid 1.5. 4. Troponin I 0.06. 5. ABG; pH 7.369, pO2 43, O2 114, bicarb 26. 6. Chest x-ray showing increased vascularity, but poor sounds     secondary to respiratory status.  No focal abnormalities noted. 7. CT head, no acute intracranial abnormalities.  ASSESSMENT AND PLAN:  This is a 55 year old female with a history of uncontrolled diabetes, who presents with altered mental status secondary to hypoglycemia and hypothermia. 1. Hypoglycemia.  This problem is most likely iatrogenic due to b.i.d.     glipizide and increasing basal Lantus since she has noticed several days of mild hypoglycemia prior to the day of admission.  The patient     feels somewhat lethargic, however, has improved mentation with     resolution of her CBGs, currently in the 150s-200s after     administration of 2 amps of D50.  We will start D5 half-normal     saline at 150 mL per hour for maintenance fluids to avoid repeated     hypoglycemic episode.  We will also cover hyperglycemia with     sensitive sliding scale insulin and we will likely need to restart     lower dose of basal insulin later tonight or tomorrow morning.  We     will discontinue glipizide for now.  We will check CBGs  q.2 h x4,     then q.i.d. or before meals or at bedtime. 2. Hypothermia likely secondary to autonomic response of her long-term     hypoglycemia.  Her temperature is now resolved after rewarming.  We     will additionally check PA and lateral chest x-ray and blood     culture to evaluate for occult infectious etiology.  However, these     are unlikely given her stable hemodynamic status currently. 3. Tooth pain.  We will consider initiating penicillin if chest x-ray     is negative to cover for possible infectious source, however, exam     does not appear to reveal infection currently.  The patient will     benefit from dental evaluation in future, but has no insurance. 4. FEN/GI.  We will  start clears diet, advance to carb modified as     tolerated.  Sliding-scale insulin and CBG was noted above.  We will     start daily PPI and follow up morning electrolytes. 5. Bipolar disorder.  We will continue the patient's home doses of     Abilify and Effexor as this is currently stable. 6. Prophylaxis.  We will start heparin subcu for VT prophylaxis. 7. Disposition is pending clinical improvement. 8. Hypertension.  We will hold the patient's home Norvasc and     lisinopril/HCTZ as her blood pressure is normotensive currently.     We will continue low-dose beta blocker at this time.  She will     likely need to restart her additional antihypertensive prior to     discharge.    ______________________________ Luis Abed, MD   ______________________________ Mercedes La, MD    JK/MEDQ  D:  02/19/2011  T:  02/19/2011  Job:  [id number]  Electronically Signed by Luis Abed MD on 03/04/2011 06:01:18 PM Electronically Signed by Dorcas Mcmurray MD on 03/12/2011 09:03:55 AM

## 2011-03-19 ENCOUNTER — Ambulatory Visit (INDEPENDENT_AMBULATORY_CARE_PROVIDER_SITE_OTHER): Payer: Self-pay | Admitting: Family Medicine

## 2011-03-19 ENCOUNTER — Encounter: Payer: Self-pay | Admitting: Family Medicine

## 2011-03-19 DIAGNOSIS — E119 Type 2 diabetes mellitus without complications: Secondary | ICD-10-CM

## 2011-03-19 DIAGNOSIS — K047 Periapical abscess without sinus: Secondary | ICD-10-CM

## 2011-03-19 DIAGNOSIS — I10 Essential (primary) hypertension: Secondary | ICD-10-CM

## 2011-03-19 MED ORDER — INSULIN GLARGINE 100 UNIT/ML ~~LOC~~ SOLN: 25.0000 [IU] | Freq: Every day | SUBCUTANEOUS | Status: DC

## 2011-03-19 MED ORDER — HYDROCODONE-ACETAMINOPHEN 7.5-500 MG PO TABS: 1.0000 | ORAL_TABLET | ORAL | Status: DC | PRN

## 2011-03-19 MED ORDER — AMLODIPINE BESYLATE 10 MG PO TABS: 5.0000 mg | ORAL_TABLET | Freq: Every day | ORAL | Status: DC

## 2011-03-19 MED ORDER — MIRTAZAPINE 30 MG PO TABS: 30.0000 mg | ORAL_TABLET | Freq: Every day | ORAL | Status: DC

## 2011-03-19 MED ORDER — AMOXICILLIN 500 MG PO CAPS: 500.0000 mg | ORAL_CAPSULE | Freq: Three times a day (TID) | ORAL | Status: AC

## 2011-03-19 NOTE — Progress Notes (Signed)
  Subjective:    Patient ID: [name], female    DOB: [birth year], 55 y.o.   MRN: [id number]  HPI Pt is here for chronic problem follow up:  HTN: BPs have been ranging in 100s-110s. Pt has had some mild weakness with this. No CP, SOB. Mild HA  DM: Pt was recently transitioned to lantus 20 units daily and novolog 3 units with meals. Pt state that blood sugars have increased since this change was made. Pt states that blood sugars have been in 250s-upper 300s. No reports of blood sugars below 100 per report. Intermittent polyuria, polydypsia  Dental Abscess: This is an acute issue. Pt states that she has noticed worsening swelling and pain in R lower jaw over last 1-2 weeks. No fevers. No purulent discharge. Pt is pending dental appt for assessment.    Review of Systems See HPI     Objective:   Physical Exam Gen: up in chair, NAD  HEENT: NCAT, EOMI, TMs clear bilaterally, + dental caries and R lower molar dental abscess  CV: RRR, no murmurs auscultated  PULM: CTAB, no wheezes, rales, rhoncii  ABD: S/NT/+ bowel sounds  EXT: 2+ peripheral pulses  Neuro: CN II-XII grossly intact.   Assessment & Plan:

## 2011-03-19 NOTE — Assessment & Plan Note (Signed)
Will increase lantus to 25 units daily as well as novolog 5 untis with largest meal. Hypoglycemia red flags discussed. Will follow up in 1 week.

## 2011-03-19 NOTE — Assessment & Plan Note (Signed)
Will decrease norvasc to 5 mg daily in setting of soft BPs on home log. Will follow up in 1 week.

## 2011-03-19 NOTE — Patient Instructions (Signed)
It was good to see you today  I am increasing your remeron  Cut your norvasc in 1/2 to 5 mg per day  We are increasing your lantus to 25 units per day; if your blood sugar drops to <100; decrease your lantus to 22 units per day. Increase your novolog to 5 units with your largest meal, use 3 units of novolog with your other small meals Come back to see me in 1 week, Call with any other questions,  God Bless, Shanda Howells MD

## 2011-03-19 NOTE — Assessment & Plan Note (Signed)
amox and vicodin for infection and pain coverage. Pt is pending dental followup. Will reassess if still present at visit next week.

## 2011-03-26 ENCOUNTER — Ambulatory Visit (INDEPENDENT_AMBULATORY_CARE_PROVIDER_SITE_OTHER): Payer: Self-pay | Admitting: Family Medicine

## 2011-03-26 ENCOUNTER — Telehealth: Payer: Self-pay | Admitting: Family Medicine

## 2011-03-26 ENCOUNTER — Encounter: Payer: Self-pay | Admitting: Family Medicine

## 2011-03-26 DIAGNOSIS — E119 Type 2 diabetes mellitus without complications: Secondary | ICD-10-CM

## 2011-03-26 DIAGNOSIS — G629 Polyneuropathy, unspecified: Secondary | ICD-10-CM

## 2011-03-26 DIAGNOSIS — R079 Chest pain, unspecified: Secondary | ICD-10-CM | POA: Insufficient documentation

## 2011-03-26 DIAGNOSIS — K047 Periapical abscess without sinus: Secondary | ICD-10-CM

## 2011-03-26 DIAGNOSIS — I10 Essential (primary) hypertension: Secondary | ICD-10-CM

## 2011-03-26 DIAGNOSIS — G589 Mononeuropathy, unspecified: Secondary | ICD-10-CM

## 2011-03-26 MED ORDER — LIDOCAINE 5 % EX PTCH: 1.0000 | MEDICATED_PATCH | Freq: Two times a day (BID) | CUTANEOUS | Status: DC

## 2011-03-26 MED ORDER — GABAPENTIN 100 MG PO CAPS: 100.0000 mg | ORAL_CAPSULE | Freq: Three times a day (TID) | ORAL | Status: DC

## 2011-03-26 NOTE — Telephone Encounter (Signed)
Pt states she could not afford the lidoderm patches. Can you RX something less expensive.

## 2011-03-26 NOTE — Assessment & Plan Note (Signed)
BPs mildly elevated at home, but weakness resolved. Will continue with current regimen.

## 2011-03-26 NOTE — Assessment & Plan Note (Addendum)
Musculoskeletal in origin. This has been a chronic issue. Will rx lidoderm patch and neurontin in setting of baseline diabetic neuropathy. Pt also with sigificant CV risk factors. Framingham score 13. Will refer to cardiology for dobutamine stress test.

## 2011-03-26 NOTE — Patient Instructions (Signed)
It was good to see you today  We will continue your current medication regiment Be sure to followup with Dr. Valentina Lucks  on Friday I'm starting you on Neurontin and the Lidoderm patch for your chest pain I am also referring her to the cardiologist for a stress test Come back to see me in 1-2 weeks Call with any questions, God Bless, Shanda Howells MD

## 2011-03-26 NOTE — Assessment & Plan Note (Signed)
Relatively stable with importance on avoidance of hypoglycemia. Will continue on current regimen.

## 2011-03-26 NOTE — Assessment & Plan Note (Signed)
Improved with amox and vicodin. Pending dental follow up.

## 2011-03-26 NOTE — Progress Notes (Signed)
  Subjective:    Patient ID: [name], female    DOB: [birth year], 55 y.o.   MRN: [id number]  HPI Pt is here for chronic problem follow up:  HTN: BPs have been ranging in 12s-440s.  Weakness has improved since decrease in antihypertensive regimen. Mild CP assd with LUE movt, NO HA, SOB.  DM: CBGs ranging for 120s-400s. Mainly in upper 200s.  No reports of blood sugars below 100 per report. Intermittent polyuria, polydypsia Dental Abscess: Improved w/p abx and vicodin. Is still pending dental evaluation.  Review of Systems See HPI     Objective:   Physical Exam Gen: up in chair, NAD  HEENT: NCAT, EOMI, TMs clear bilaterally, + dental caries and R lower molar dental abscess; improved CV: RRR, no murmurs auscultated; + TTP along anterior chest wall PULM: CTAB, no wheezes, rales, rhoncii  ABD: S/NT/+ bowel sounds  EXT: 2+ peripheral pulses  Neuro: CN II-XII grossly intact.   Assessment & Plan:

## 2011-03-28 ENCOUNTER — Ambulatory Visit: Payer: Self-pay | Admitting: Pharmacist

## 2011-04-04 ENCOUNTER — Ambulatory Visit (INDEPENDENT_AMBULATORY_CARE_PROVIDER_SITE_OTHER): Payer: Self-pay | Admitting: Pharmacist

## 2011-04-04 VITALS — BP 150/82 | HR 92 | Ht 62.5 in | Wt 141.6 lb

## 2011-04-04 DIAGNOSIS — E119 Type 2 diabetes mellitus without complications: Secondary | ICD-10-CM

## 2011-04-04 DIAGNOSIS — K219 Gastro-esophageal reflux disease without esophagitis: Secondary | ICD-10-CM

## 2011-04-04 NOTE — Patient Instructions (Signed)
Good seeing you today! Make sure to turn in your paper work this week. Continue Lantus 25 units every morning. Take Novolog 5 units with bigger meals, wait for dose until you know that you will be able to keep the food down. Take 3 units with smaller meals. Follow up with Dr. Ernestina Patches next week.

## 2011-04-04 NOTE — Progress Notes (Signed)
  Subjective:    Patient ID: [name], female    DOB: [birth year], 55 y.o.   MRN: [id number]  HPI Pt. Come to the clinic in good spirit accompanied by her family. She states that she is not able to "keep the food down" with her Metoclopramide. Pt eats very irregularly, most days she eats 1 or 2 meals a day because of N/V. She does not take her Novolog when she does not eat. Pt complains of being sleepy when she takes her nerve pill (Gabapentin). She is not able to afford Lidocaine patch or Nitroglycerin.   Review of Systems     Objective:   Physical Exam  Home CBGs reported by patient:  130-200s.       Assessment & Plan:   Diabetes of 5 yrs duration improved but remains fair control of blood glucose based on   Lab Results  Component Value Date   HGBA1C 11.5 01/30/2011    ,home fasting CBG readings of 130-200. Control is suboptimal due to irregular diet and skipping meals. Denies hypoglycemic events.  Able to verbalize appropriate hypoglycemia management plan. Continued basal insulin Lantus (insulin glargine) at 25 units in the morning. Increased dose of rapid insulin Novolog (insulin aspart) to 3 units with small meals and 5 units with bigger meals.  Written patient instructions provided.  Follow up in  Pharmacist Clinic Visit 3-4 weeks.   TTFFC 30 minutes.  Patient seen with Volanda Napoleon, PharmD Candidate and Ralene Bathe, Pharmacy Resident.

## 2011-04-04 NOTE — Assessment & Plan Note (Signed)
Diabetes of 5 yrs duration improved but remains fair control of blood glucose based on   Lab Results  Component Value Date   HGBA1C 11.5 01/30/2011    ,home fasting CBG readings of 130-200. Control is suboptimal due to irregular diet and skipping meals. Denies hypoglycemic events.  Able to verbalize appropriate hypoglycemia management plan. Continued basal insulin Lantus (insulin glargine) at 25 units in the morning. Increased dose of rapid insulin Novolog (insulin aspart) to 3 units with small meals and 5 units with bigger meals.  Written patient instructions provided.  Follow up in  Pharmacist Clinic Visit 3-4 weeks.   TTFFC 30 minutes.  Patient seen with Volanda Napoleon, PharmD Candidate and Ralene Bathe, Pharmacy Resident.

## 2011-04-05 NOTE — Progress Notes (Signed)
  Subjective:    Patient ID: [name], female    DOB: November 26, 1955, 55 y.o.   MRN: [id number]  HPI Reviewed and agree with Dr. Graylin Shiver management.     Review of Systems     Objective:   Physical Exam        Assessment & Plan:

## 2011-04-08 ENCOUNTER — Encounter: Payer: Self-pay | Admitting: Family Medicine

## 2011-04-08 ENCOUNTER — Ambulatory Visit (INDEPENDENT_AMBULATORY_CARE_PROVIDER_SITE_OTHER): Payer: Self-pay | Admitting: Family Medicine

## 2011-04-08 VITALS — BP 114/71 | HR 98 | Ht 62.0 in | Wt 142.0 lb

## 2011-04-08 DIAGNOSIS — E119 Type 2 diabetes mellitus without complications: Secondary | ICD-10-CM

## 2011-04-08 DIAGNOSIS — K089 Disorder of teeth and supporting structures, unspecified: Secondary | ICD-10-CM

## 2011-04-08 DIAGNOSIS — E785 Hyperlipidemia, unspecified: Secondary | ICD-10-CM

## 2011-04-08 DIAGNOSIS — K0889 Other specified disorders of teeth and supporting structures: Secondary | ICD-10-CM

## 2011-04-08 DIAGNOSIS — I10 Essential (primary) hypertension: Secondary | ICD-10-CM

## 2011-04-08 DIAGNOSIS — K047 Periapical abscess without sinus: Secondary | ICD-10-CM

## 2011-04-08 MED ORDER — INSULIN GLARGINE 100 UNIT/ML ~~LOC~~ SOLN: SUBCUTANEOUS | Status: DC

## 2011-04-08 NOTE — Progress Notes (Signed)
  Subjective:    Patient ID: [name], female    DOB: 1955-10-24, 55 y.o.   MRN: [id number]  HPI Pt is here for chronic problem follow up:  HTN: BPs have been ranging in90s-150s.  Pt still with mild weakness in setting of recent decrease . Mild CP assd with LUE movt, NO HA, SOB.   DM: CBGs ranging from 90s-300s. Pt was recently seen in pharmacy clinic with Dr. Valentina Lucks. Major recommendation at the time was for increase in mealtime coverage to 5 units of novolog with large meals and 3 units with small meals. + Intermittent polyuria, polydypsia  Dental Abscess: Improved w/ abx and vicodin. Is still pending dental evaluation.Pt states that she needs a formal referral from [facility] for evaluation.   Review of Systems See HPI     Objective:   Physical Exam Gen: up in chair, NAD  HEENT: NCAT, EOMI, TMs clear bilaterally, + dental caries and R lower molar dental abscess; improved CV: RRR, no murmurs auscultated PULM: CTAB, no wheezes, rales, rhoncii  ABD: S/NT/+ bowel sounds  EXT: 2+ peripheral pulses  Neuro: CN II-XII grossly intact.   Assessment & Plan:

## 2011-04-08 NOTE — Patient Instructions (Signed)
It was good to meet you today  Decrease your lantus to 23 units per day STOP the norvasc (amlodipine) I am also checking some lab work, Come back to see me in 1 week,  Call with any questions,  God Bless,  Shanda Howells MD

## 2011-04-08 NOTE — Assessment & Plan Note (Signed)
Will decrease lantus to 23 units as pt has had episodes of symptomatic CBGs<100.

## 2011-04-08 NOTE — Assessment & Plan Note (Signed)
Checking lipids today.  Given multiple CV risk factors (Framingham risk score @ apprx 27% if LDL WNL). Also setting up pt for outpt dobutamine stress test. Likely exercise intolerant at baseline.

## 2011-04-08 NOTE — Assessment & Plan Note (Signed)
Norvasc d/c'd as pt has had symptomatic SBPs into the 90s. Will reassess in 1-2 weeks.

## 2011-04-08 NOTE — Assessment & Plan Note (Signed)
Overall stable. Will make formal referral to dentistry per pt request.

## 2011-04-09 LAB — COMPREHENSIVE METABOLIC PANEL
AST: 9 U/L (ref 0–37)
Albumin: 4 g/dL (ref 3.5–5.2)
BUN: 21 mg/dL (ref 6–23)
Calcium: 9.6 mg/dL (ref 8.4–10.5)
Chloride: 104 mEq/L (ref 96–112)
Creat: 0.96 mg/dL (ref 0.50–1.10)
Glucose, Bld: 150 mg/dL — ABNORMAL HIGH (ref 70–99)

## 2011-04-09 LAB — LIPID PANEL
Cholesterol: 272 mg/dL — ABNORMAL HIGH (ref 0–200)
Triglycerides: 195 mg/dL — ABNORMAL HIGH (ref <150)
VLDL: 39 mg/dL (ref 0–40)

## 2011-04-14 ENCOUNTER — Telehealth: Payer: Self-pay | Admitting: Family Medicine

## 2011-04-14 NOTE — Telephone Encounter (Signed)
Sent message to Dr Ernestina Patches, patient must be on pain meds and antibiotics in order to send dental referral through [facility] since she has the orange card. Will send referral once and notify patient once this is done.[name], [name]

## 2011-04-14 NOTE — Telephone Encounter (Signed)
Wants to know status of her dental referral

## 2011-04-18 ENCOUNTER — Encounter: Payer: Self-pay | Admitting: Family Medicine

## 2011-04-18 ENCOUNTER — Ambulatory Visit (INDEPENDENT_AMBULATORY_CARE_PROVIDER_SITE_OTHER): Payer: Self-pay | Admitting: Family Medicine

## 2011-04-18 DIAGNOSIS — E119 Type 2 diabetes mellitus without complications: Secondary | ICD-10-CM

## 2011-04-18 DIAGNOSIS — I10 Essential (primary) hypertension: Secondary | ICD-10-CM

## 2011-04-18 DIAGNOSIS — E785 Hyperlipidemia, unspecified: Secondary | ICD-10-CM

## 2011-04-18 DIAGNOSIS — K047 Periapical abscess without sinus: Secondary | ICD-10-CM

## 2011-04-18 DIAGNOSIS — O24919 Unspecified diabetes mellitus in pregnancy, unspecified trimester: Secondary | ICD-10-CM

## 2011-04-18 DIAGNOSIS — F17200 Nicotine dependence, unspecified, uncomplicated: Secondary | ICD-10-CM

## 2011-04-18 MED ORDER — AMOXICILLIN 500 MG PO CAPS: 500.0000 mg | ORAL_CAPSULE | Freq: Two times a day (BID) | ORAL | Status: DC

## 2011-04-18 MED ORDER — PRAVASTATIN SODIUM 40 MG PO TABS: 40.0000 mg | ORAL_TABLET | Freq: Every evening | ORAL | Status: DC

## 2011-04-18 NOTE — Patient Instructions (Signed)
It was good to see you today  I am starting you on cholesterol medicine I am referring you to the eye and foot doctor I have started you on antibiotics for your teeth, we will set up your dental referrral. Avoid high fat foods Use glucerna to supplement your meals Come back to see me in 2 weeks Call with any questions,  God Bless, Shanda Howells MD    Cholesterol Control Diet Cholesterol levels in your body are determined significantly by your diet. Cholesterol levels may also be related to heart disease. The following material helps to explain this relationship and discusses what you can do to help keep your heart healthy. Not all cholesterol is bad. Low-density lipoprotein (LDL) cholesterol is the "bad" cholesterol. It may cause fatty deposits to build up inside your arteries. High-density lipoprotein (HDL) cholesterol is "good." It helps to remove the "bad" LDL cholesterol from your blood. Cholesterol is a very important risk factor for heart disease. Other risk factors are high blood pressure, smoking, stress, heredity, and weight. The heart muscle gets its supply of blood through the coronary arteries. If your LDL cholesterol is high and your HDL cholesterol is low, you are at risk for having fatty deposits build up in your coronary arteries. This leaves less room through which blood can flow. Without sufficient blood and oxygen, the heart muscle cannot function properly and you may feel chest pains (angina pectoris). When a coronary artery closes up entirely, a part of the heart muscle may die, causing a heart attack (myocardial infarction). CHECKING CHOLESTEROL When your caregiver sends your blood to a lab to be analyzed for cholesterol, a complete lipid (fat) profile may be done. With this test, the total amount of cholesterol and levels of LDL and HDL are determined. Triglycerides are a type of fat that circulates in the blood and can also be used to determine heart disease risk. The list  below describes what the numbers should be: Test: Total Cholesterol.  Less than 200 mg/dl.  Test: LDL "bad cholesterol."  Less than 100 mg/dl.   Less than 70 mg/dl if you are at very high risk of a heart attack or sudden cardiac death.  Test: HDL "good cholesterol."  Greater than 50 mg/dl for women.   Greater than 40 mg/dl for men.  Test: Triglycerides.  Less than 150 mg/dl.  CONTROLLING CHOLESTEROL WITH DIET Although exercise and lifestyle factors are important, your diet is key. That is because certain foods are known to raise cholesterol and others to lower it. The goal is to balance foods for their effect on cholesterol and more importantly, to replace saturated and trans fat with other types of fat, such as monounsaturated fat, polyunsaturated fat, and omega-3 fatty acids. On average, a person should consume no more than 15 to 17 g of saturated fat daily. Saturated and trans fats are considered "bad" fats, and they will raise LDL cholesterol. Saturated fats are primarily found in animal products such as meats, butter, and cream. However, that does not mean you need to sacrifice all your favorite foods. Today, there are good tasting, low-fat, low-cholesterol substitutes for most of the things you like to eat. Choose low-fat or nonfat alternatives. Choose round or loin cuts of red meat, since these types of cuts are lowest in fat and cholesterol. Chicken (without the skin), fish, veal, and ground [place] breast are excellent choices. Eliminate fatty meats, such as hot dogs and salami. Even shellfish have little or no saturated fat. Have a  3 oz (85 g) portion when you eat lean meat, poultry, or fish. Trans fats are also called "partially hydrogenated oils." They are oils that have been scientifically manipulated so that they are solid at room temperature resulting in a longer shelf life and improved taste and texture of foods in which they are added. Trans fats are found in stick margarine, some  tub margarines, cookies, crackers, and baked goods.  When baking and cooking, oils are an excellent substitute for butter. The monounsaturated oils are especially beneficial since it is believed they lower LDL and raise HDL. The oils you should avoid entirely are saturated tropical oils, such as coconut and palm.  Remember to eat liberally from food groups that are naturally free of saturated and trans fat, including fish, fruit, vegetables, beans, grains (barley, rice, couscous, bulgur wheat), and pasta (without cream sauces).  IDENTIFYING FOODS THAT LOWER CHOLESTEROL  Soluble fiber may lower your cholesterol. This type of fiber is found in fruits such as apples, vegetables such as broccoli, potatoes, and carrots, legumes such as beans, peas, and lentils, and grains such as barley. Foods fortified with plant sterols (phytosterol) may also lower cholesterol. You should eat at least 2 g per day of these foods for a cholesterol lowering effect.  Read package labels to identify low-saturated fats, trans fats free, and low-fat foods at the supermarket. Select cheeses that have only 2 to 3 g saturated fat per ounce. Use a heart-healthy tub margarine that is free of trans fats or partially hydrogenated oil. When buying baked goods (cookies, crackers), avoid partially hydrogenated oils. Breads and muffins should be made from whole grains (whole-wheat or whole oat flour, instead of "flour" or "enriched flour"). Buy non-creamy canned soups with reduced salt and no added fats.  FOOD PREPARATION TECHNIQUES  Never deep-fry. If you must fry, either stir-fry, which uses very little fat, or use non-stick cooking sprays. When possible, broil, bake, or roast meats, and steam vegetables. Instead of dressing vegetables with butter or margarine, use lemon and herbs, applesauce and cinnamon (for squash and sweet potatoes), nonfat yogurt, salsa, and low-fat dressings for salads.  LOW-SATURATED FAT / LOW-FAT FOOD  SUBSTITUTES Meats / Saturated Fat (g)  Avoid: Steak, marbled (3 oz/85 g) / 11 g   Choose: Steak, lean (3 oz/85 g) / 4 g   Avoid: Hamburger (3 oz/85 g) / 7 g   Choose: Hamburger, lean (3 oz/85 g) / 5 g   Avoid: Ham (3 oz/85 g) / 6 g   Choose: Ham, lean cut (3 oz/85 g) / 2.4 g   Avoid: Chicken, with skin, dark meat (3 oz/85 g) / 4 g   Choose: Chicken, skin removed, dark meat (3 oz/85 g) / 2 g   Avoid: Chicken, with skin, light meat (3 oz/85 g) / 2.5 g   Choose: Chicken, skin removed, light meat (3 oz/85 g) / 1 g  Dairy / Saturated Fat (g)  Avoid: Whole milk (1 cup) / 5 g   Choose: Low-fat milk, 2% (1 cup) / 3 g   Choose: Low-fat milk, 1% (1 cup) / 1.5 g   Choose: Skim milk (1 cup) / 0.3 g   Avoid: Hard cheese (1 oz/28 g) / 6 g   Choose: Skim milk cheese (1 oz/28 g) / 2 to 3 g   Avoid: Cottage cheese, 4% fat (1 cup) / 6.5 g   Choose: Low-fat cottage cheese, 1% fat (1 cup) / 1.5 g   Avoid: Ice cream (1  cup) / 9 g   Choose: Sherbet (1 cup) / 2.5 g   Choose: Nonfat frozen yogurt (1 cup) / 0.3 g   Choose: Frozen fruit bar / trace   Avoid: Whipped cream (1 tbs) / 3.5 g   Choose: Nondairy whipped topping (1 tbs) / 1 g  Condiments / Saturated Fat (g)  Avoid: Mayonnaise (1 tbs) / 2 g   Choose: Low-fat mayonnaise (1 tbs) / 1 g   Avoid: Butter (1 tbs) / 7 g   Choose: Extra light margarine (1 tbs) / 1 g   Avoid: Coconut oil (1 tbs) / 11.8 g   Choose: Olive oil (1 tbs) / 1.8 g   Choose: Corn oil (1 tbs) / 1.7 g   Choose: Safflower oil (1 tbs) / 1.2 g   Choose: Sunflower oil (1 tbs) / 1.4 g   Choose: Soybean oil (1 tbs) / 2.4 g   Choose: Canola oil (1 tbs) / 1 g  Document Released: 06/02/2005 Document Revised: 02/12/2011 Document Reviewed: 11/21/2010 [facility] Patient Information 2012 [place], [place].

## 2011-04-20 ENCOUNTER — Encounter: Payer: Self-pay | Admitting: Family Medicine

## 2011-04-20 NOTE — Assessment & Plan Note (Signed)
Overall stable. Will continue on current regimen.

## 2011-04-20 NOTE — Assessment & Plan Note (Signed)
Will re-refer to dentistry. Will start on antibiotics.

## 2011-04-20 NOTE — Assessment & Plan Note (Signed)
Not ready to quit. Encouraged cessation 

## 2011-04-20 NOTE — Assessment & Plan Note (Signed)
Noted LDL near 200 in setting of goal LDL <70. Will start on pravachol. Handout on low fat diet also given and discussed.

## 2011-04-20 NOTE — Progress Notes (Signed)
  Subjective:    Patient ID: [name], female    DOB: 11-11-1955, 55 y.o.   MRN: [id number]  HPI Pt is here for chronic problem follow up:  HTN: BPs have been ranging in 100s-150s.Weakness has improved since last clinical visit.  Mild CP assd with LUE movt, NO HA, SOB.   DM: CBGs ranging from 100s-300s. Overall symptoms of weakness and fatigue have been improved since last clinical. + Intermittent polyuria, polydipsia.  Pt states that she has misses meals on a regular basis. Pt has not used meal supplements in the past.   Dental Abscess: Improved w/ abx and vicodin. Is still pending dental evaluation. Formal evaluation requires that t be on abx before referral can be done.    Review of Systems See HPI     Objective:   Physical Exam Gen: up in chair, NAD  HEENT: NCAT, EOMI, TMs clear bilaterally, + dental caries and R lower molar dental abscess; improved CV: RRR, no murmurs auscultated PULM: CTAB, no wheezes, rales, rhoncii  ABD: S/NT/+ bowel sounds  EXT: 2+ peripheral pulses  Neuro: CN II-XII grossly intact.  , no decreased sensation on monofilament testing on LEs bilaterally.  Assessment & Plan:

## 2011-04-20 NOTE — Assessment & Plan Note (Addendum)
Will continue with lantus 23. Instructed pt to supplement missed meals with glucerna as to bridge blood sugars. Will follow up in 2-3 weeks.  Will also refer to podiatry and ophthalmology for diabetic.

## 2011-04-21 ENCOUNTER — Encounter: Payer: Self-pay | Admitting: Internal Medicine

## 2011-04-21 ENCOUNTER — Other Ambulatory Visit: Payer: Self-pay | Admitting: Family Medicine

## 2011-04-21 DIAGNOSIS — Z1231 Encounter for screening mammogram for malignant neoplasm of breast: Secondary | ICD-10-CM

## 2011-04-22 NOTE — Telephone Encounter (Signed)
Pt was placed back on abx pending dental referral.

## 2011-04-23 ENCOUNTER — Institutional Professional Consult (permissible substitution): Payer: Self-pay | Admitting: Cardiovascular Disease

## 2011-05-06 ENCOUNTER — Ambulatory Visit (INDEPENDENT_AMBULATORY_CARE_PROVIDER_SITE_OTHER): Payer: Self-pay | Admitting: Family Medicine

## 2011-05-06 ENCOUNTER — Encounter: Payer: Self-pay | Admitting: Family Medicine

## 2011-05-06 DIAGNOSIS — K047 Periapical abscess without sinus: Secondary | ICD-10-CM

## 2011-05-06 DIAGNOSIS — R079 Chest pain, unspecified: Secondary | ICD-10-CM

## 2011-05-06 DIAGNOSIS — I10 Essential (primary) hypertension: Secondary | ICD-10-CM

## 2011-05-06 DIAGNOSIS — E119 Type 2 diabetes mellitus without complications: Secondary | ICD-10-CM

## 2011-05-06 DIAGNOSIS — F17200 Nicotine dependence, unspecified, uncomplicated: Secondary | ICD-10-CM

## 2011-05-06 MED ORDER — CARVEDILOL 6.25 MG PO TABS: ORAL_TABLET | ORAL | Status: DC

## 2011-05-06 MED ORDER — AMOXICILLIN 500 MG PO CAPS: 500.0000 mg | ORAL_CAPSULE | Freq: Two times a day (BID) | ORAL | Status: AC

## 2011-05-06 MED ORDER — HYDROCODONE-ACETAMINOPHEN 7.5-500 MG PO TABS
1.0000 | ORAL_TABLET | ORAL | Status: AC | PRN
Start: 2011-05-06 — End: 2011-05-16

## 2011-05-06 MED ORDER — INSULIN ASPART 100 UNIT/ML ~~LOC~~ SOLN: 3.0000 [IU] | Freq: Three times a day (TID) | SUBCUTANEOUS | Status: DC

## 2011-05-06 MED ORDER — INSULIN NPH (HUMAN) (ISOPHANE) 100 UNIT/ML ~~LOC~~ SUSP: SUBCUTANEOUS | Status: DC

## 2011-05-06 NOTE — Progress Notes (Signed)
  Subjective:    Patient ID: [name], female    DOB: 11/09/1955, 55 y.o.   MRN: [id number]  HPI Pt is here for chronic problem follow up...  DM: 1.Checking CBGs?:yes  2.How Often?:checking 2-3 times per day. Pt states that she ran out of lantus over the weekend.   3.Blood Sugar Range:125-331 4.Polyuria, polydypsia, polyphagia:no 5.Symptomatic Hypoglycemia:no 6.Medication Compliance?:yes  7.ACE if hypertensive/obesity?:yes  8.Statin if LDL >100?:yes pravachol 40 mg daily   HTN:  1. Checking BPS Daily ?:yes  2. BP Range:90s-140s; mainly in 140s  3. Any HA, CP, SOB?:yes; CP and SOB; this has been a chronic issue and has been evaluated in the past. 4. Any Medication Side Effects:pt states that she is sometimes symptomatic with evening coreg dose.  5. Medication Compliance:yes  6. Salt intake?:low salt per pt 7. Exercise?:no 8. Weight Loss?:relatively stable  Review of Systems See HPI     Objective:   Physical Exam Gen: up in chair, NAD  HEENT: NCAT, EOMI, TMs clear bilaterally, + dental caries and R lower molar dental abscess; improved  CV: RRR, no murmurs auscultated  PULM: CTAB, no wheezes, rales, rhoncii  ABD: S/NT/+ bowel sounds  EXT: 2+ peripheral pulses  Neuro: CN II-XII grossly intact.    Assessment & Plan:

## 2011-05-06 NOTE — Assessment & Plan Note (Signed)
Pt still smoking. Not ready to quit.

## 2011-05-06 NOTE — Patient Instructions (Signed)
It was good to see you today  I am switching you to insulin N; 10 units twice daily. [facility]. Continue to take the novolog as needed  Cut your evening dose of coreg in 1/2 to avoid your low blood pressure sxs We are referring you to dentistry and cardiology  Follow up with me in 2 weeks, Call with any questions,  God Bless Shanda Howells MD

## 2011-05-06 NOTE — Assessment & Plan Note (Signed)
Scripts for abx and narcotics rewritten such that dental referral can be processed.

## 2011-05-06 NOTE — Assessment & Plan Note (Signed)
Decreasing PM coreg dose as pt is symptomatic with this. Pt's HR as it ULN. Will consider transitioning to more beta selective medication in the future. Will follow up at next visit. Pt also referred to cardiology for likely dobutamine stress test given pt's CV risk factors.

## 2011-05-06 NOTE — Assessment & Plan Note (Signed)
Will transition pt to NPH 10 BID as this is an inexpensive option that pt can obtain from [facility]. Discussed hypoglycemic red flags. Will follow up 2-3 weeks. Will also set up for ophtho appt.

## 2011-05-06 NOTE — Assessment & Plan Note (Signed)
Baseline hx/o MSK CP. Cardiology referral re-addressed today with pt having planned f/u next month. Discussed CV red flags. Pt agreeable.

## 2011-05-12 ENCOUNTER — Encounter: Payer: Self-pay | Admitting: Family Medicine

## 2011-05-12 ENCOUNTER — Ambulatory Visit (INDEPENDENT_AMBULATORY_CARE_PROVIDER_SITE_OTHER): Payer: Self-pay | Admitting: *Deleted

## 2011-05-12 ENCOUNTER — Ambulatory Visit (INDEPENDENT_AMBULATORY_CARE_PROVIDER_SITE_OTHER): Payer: Self-pay | Admitting: Family Medicine

## 2011-05-12 DIAGNOSIS — I10 Essential (primary) hypertension: Secondary | ICD-10-CM

## 2011-05-12 DIAGNOSIS — E119 Type 2 diabetes mellitus without complications: Secondary | ICD-10-CM

## 2011-05-12 LAB — GLUCOSE, CAPILLARY
Glucose-Capillary: 499 mg/dL — ABNORMAL HIGH (ref 70–99)
Glucose-Capillary: 207 mg/dL — ABNORMAL HIGH (ref 70–99)

## 2011-05-12 NOTE — Progress Notes (Signed)
  Subjective:    Patient ID: [name], female    DOB: Jan 01, 1956, 55 y.o.   MRN: [id number]  HPI Patient is here for blood sugar follow. The patient is noted to have had a recent blood sugar into the 500s as patient was unable to fill her insulin prescription. Patient with significant weakness, dizziness, as well as generalized malaise with this. Patient is instructed earlier today to take NovoLog 10 units for treatment as of this as was picking up her NPH prescription. Currently the patient states she feels essentially unchanged previously. Patient states that she has been compliant using the NovoLog 10 units. Patient states that she has not eaten. Patient also reports some worsening weakness associated with low blood pressure. Patient has been compliant with her decreased Coreg dose. Patient also reports decreased overall by mouth intake since her blood sugars have been high. No nausea or vomiting. No diarrhea. Patient feels that she will be able to go home today. Patient's sister because has been in generally issue has been long-standing for over the last year. Dental abscess has been relatively stable. Pain has overall stable as well. Pt states that she was contacted by dentistry with earliest appointment being in early January. No purulent drainage.    Review of Systems See HPI     Objective:   Physical Exam Gen: up in chair, NAD  HEENT: NCAT, EOMI, TMs clear bilaterally, + dental caries and R lower molar dental abscess; improved  CV: RRR, no murmurs auscultated  PULM: CTAB, no wheezes, rales, rhoncii  ABD: S/NT/+ bowel sounds  EXT: 2+ peripheral pulses  Neuro: CN II-XII grossly intact.   Assessment & Plan:

## 2011-05-12 NOTE — Progress Notes (Signed)
Patient came to office this AM stating [name] is unable to get Novolog insulin for her . She has to apply to [employer] and they will be able to get for her. She did pick up the Novolin N today. States she has been without insulin for 2 weeks. BS this AM 548 she states.  Gave her one sample of Novolog  Insulin from our sample supply..   BS checked in office 499 . Has not eaten since last night at 8:00 PM.  Consulted with Dr. Walker Kehr and he advises patient needs to return today to see Dr. Ernestina Patches. He will be in [facility] this afternoon with Dr. Walker Kehr. Advised patient to go home and  take 10 units of Novoolg just before eating lunch. Return at 2:45 to see MD .  She will start Novolin NPH tonight . States she feels  OK now. Has been very thirsty. Poor appetite. Dr. Walker Kehr came in to speak with patient also. She understands important to return today to see MD.

## 2011-05-12 NOTE — Progress Notes (Signed)
Orthostatic BP checks were done at time of office visit . Times entered was time BP was  charted.

## 2011-05-12 NOTE — Assessment & Plan Note (Signed)
Orthostatics positive in clinic today. NS 1L bolus unsuccessfully attempted in clinic. Pt feels that she can go home. Discussed CV red flags at length. Pt instructed to hold coreg. Will follow up tomorrow to reassess BPs. Pt agreeable to plan.

## 2011-05-12 NOTE — Assessment & Plan Note (Signed)
CBG 205 s/p novolg 10 uints with a meal. Pt instructed to take NPH 5 units with meal tonight. NO meal time coverage. No am coverage. Will reassess CBG tomorrow in clinic. Discussed CV and glycemic red flags at length. Will follow up tomorrow.

## 2011-05-12 NOTE — Patient Instructions (Signed)
It was good to see you again Take NPH 5 units tonight. Please take this with a meal. Do not worry about mealtime coverage with NovoLog. Stop your Coreg. Come back to see me tomorrow. If your weakness worsens,  please go to the ED tonight. Call with any questions,  God Bless, Shanda Howells MD

## 2011-05-13 ENCOUNTER — Ambulatory Visit (INDEPENDENT_AMBULATORY_CARE_PROVIDER_SITE_OTHER): Payer: Self-pay | Admitting: Family Medicine

## 2011-05-13 ENCOUNTER — Encounter: Payer: Self-pay | Admitting: Family Medicine

## 2011-05-13 VITALS — BP 148/81 | HR 98 | Ht 62.0 in | Wt 136.0 lb

## 2011-05-13 DIAGNOSIS — R079 Chest pain, unspecified: Secondary | ICD-10-CM

## 2011-05-13 DIAGNOSIS — E119 Type 2 diabetes mellitus without complications: Secondary | ICD-10-CM

## 2011-05-13 DIAGNOSIS — I10 Essential (primary) hypertension: Secondary | ICD-10-CM

## 2011-05-13 LAB — GLUCOSE, CAPILLARY: Glucose-Capillary: 484 mg/dL — ABNORMAL HIGH (ref 70–99)

## 2011-05-13 LAB — POCT GLYCOSYLATED HEMOGLOBIN (HGB A1C): Hemoglobin A1C: 9.8

## 2011-05-13 LAB — BASIC METABOLIC PANEL
BUN: 32 mg/dL — ABNORMAL HIGH (ref 6–23)
Calcium: 9.8 mg/dL (ref 8.4–10.5)
Creat: 1.12 mg/dL — ABNORMAL HIGH (ref 0.50–1.10)
Glucose, Bld: 470 mg/dL — ABNORMAL HIGH (ref 70–99)
Potassium: 4.3 mEq/L (ref 3.5–5.3)

## 2011-05-13 MED ORDER — METOPROLOL SUCCINATE ER 25 MG PO TB24: 6.2500 mg | ORAL_TABLET | Freq: Every day | ORAL | Status: DC

## 2011-05-13 NOTE — Assessment & Plan Note (Signed)
BPs improved. Coreg d/c'd. Will start pt on very low dose metoprolol as this is more beta selective. Will follow up in 3 days to reassess BPs.

## 2011-05-13 NOTE — Assessment & Plan Note (Signed)
Will start pt on NPH 10 units BID as well as previous mealtime coverage. A1C improved. Goal A1C is likely 8-9.

## 2011-05-13 NOTE — Progress Notes (Signed)
  Subjective:    Patient ID: [name], female    DOB: May 09, 1956, 55 y.o.   MRN: [id number]  HPI Pt is here for general medical follow up.  Pt was seen yesterday with noted CBG of 500 as well as SBPs in the 90s and + orthostatics. Pt was instructed to hold beta blocker as well as begin NPH 5 units with planned follow up today.  Today pt states that weakness is improved since yesterday. Pt has held her coreg.  Pt states that her blood sugars have been in the 300s this morning. No episodes of syncope, SOB. Pt has longstanding CP. This has been stable.  Review of Systems See HPI    Objective:   Physical Exam Gen: up in chair, NAD  HEENT: NCAT, EOMI, TMs clear bilaterally, + dental caries and R lower molar dental abscess; improved  CV: RRR, no murmurs auscultated  PULM: CTAB, no wheezes, rales, rhoncii  ABD: S/NT/+ bowel sounds  EXT: 2+ peripheral pulses  Neuro: CN II-XII grossly intact.    Assessment & Plan:

## 2011-05-13 NOTE — Patient Instructions (Signed)
It was good to see you today I am switching your from coreg to metoprolol for your blood pressures Take NPH 10 units twice daily. You can take your 8pm dose as 6pm today  Come back to see me on the 30th (Friday) Call with any questions, God Bless,  Shanda Howells MD

## 2011-05-13 NOTE — Assessment & Plan Note (Signed)
Pending cardiology f/u in December.

## 2011-05-16 ENCOUNTER — Ambulatory Visit (INDEPENDENT_AMBULATORY_CARE_PROVIDER_SITE_OTHER): Payer: Self-pay | Admitting: Family Medicine

## 2011-05-16 VITALS — BP 149/88 | HR 105 | Temp 97.9°F | Ht 62.0 in | Wt 137.5 lb

## 2011-05-16 DIAGNOSIS — E119 Type 2 diabetes mellitus without complications: Secondary | ICD-10-CM

## 2011-05-16 DIAGNOSIS — I10 Essential (primary) hypertension: Secondary | ICD-10-CM

## 2011-05-16 MED ORDER — METOPROLOL TARTRATE 12.5 MG HALF TABLET: 6.2500 mg | ORAL_TABLET | Freq: Two times a day (BID) | ORAL | Status: DC

## 2011-05-16 NOTE — Patient Instructions (Signed)
It was good to see you today  Increase your NPH to 12 units twice daily Take no more that 5 units of novolog with your meals I am switching you to lopressor. You can fill this at [facility] back to see me next week Please set up an appt for [place] December 13th at 2 pm  Call with any questions,  God Bless, Shanda Howells MD

## 2011-05-18 NOTE — Progress Notes (Signed)
  Subjective:    Patient ID: [name], female    DOB: 04-19-1956, 55 y.o.   MRN: [id number]  HPI Pt is here for chronic problem follow up.  DM: CBGS have been in 200s-300s since last clinical visit. Pt states that she has been using NPH 10 BID. Pt reports one episode of CBG 450. Pt states that she took novolog 10 units and CBG dropped to 70s.   HTN: Pt was recently placed on toprol for BP management in setting of persistent eleavted HR that nears tachycardia. Pt states that she did not fill this 2/2 cost (was 30 dollars). Pt states that weakness and dizziness have improved since norvasc was recently stopped.   Review of Systems See HPI     Objective:   Physical Exam Gen: up in chair, NAD  HEENT: NCAT, EOMI, TMs clear bilaterally, + dental caries and R lower molar dental abscess; improved  CV: RRR, no murmurs auscultated  PULM: CTAB, no wheezes, rales, rhoncii  ABD: S/NT/+ bowel sounds  EXT: 2+ peripheral pulses  Neuro: CN II-XII grossly intact.    Assessment & Plan:

## 2011-05-18 NOTE — Assessment & Plan Note (Signed)
Plan to switch pt to lopressor as this noted on [facility] 4$ list. Start dose at 6.25 mg BID. Will follow up in 1 week to reassess.

## 2011-05-18 NOTE — Assessment & Plan Note (Addendum)
Will increase NPH to 12 units BID, discussed with pt that novolog meal/sliding scale coverage should be no more that 5 units. Will follow up in 1 week.  Plan to also set up pt for nutrition counseling to better assess diet.

## 2011-05-19 ENCOUNTER — Ambulatory Visit (HOSPITAL_COMMUNITY)
Admission: RE | Admit: 2011-05-19 | Discharge: 2011-05-19 | Disposition: A | Discharge status: Home / Self Care | Payer: Self-pay | Source: Ambulatory Visit | Attending: Family Medicine | Admitting: Family Medicine

## 2011-05-19 DIAGNOSIS — Z1231 Encounter for screening mammogram for malignant neoplasm of breast: Secondary | ICD-10-CM | POA: Insufficient documentation

## 2011-05-21 ENCOUNTER — Other Ambulatory Visit: Payer: Self-pay

## 2011-05-21 ENCOUNTER — Other Ambulatory Visit: Payer: Self-pay | Admitting: Cardiology

## 2011-05-21 ENCOUNTER — Encounter: Payer: Self-pay | Admitting: *Deleted

## 2011-05-21 ENCOUNTER — Encounter (AMBULATORY_SURGERY_CENTER): Payer: Self-pay | Admitting: *Deleted

## 2011-05-21 ENCOUNTER — Ambulatory Visit: Payer: Self-pay

## 2011-05-21 ENCOUNTER — Encounter: Payer: Self-pay | Admitting: Cardiology

## 2011-05-21 ENCOUNTER — Ambulatory Visit (INDEPENDENT_AMBULATORY_CARE_PROVIDER_SITE_OTHER): Payer: Self-pay | Admitting: Cardiology

## 2011-05-21 DIAGNOSIS — F17200 Nicotine dependence, unspecified, uncomplicated: Secondary | ICD-10-CM

## 2011-05-21 DIAGNOSIS — R079 Chest pain, unspecified: Secondary | ICD-10-CM

## 2011-05-21 DIAGNOSIS — E7800 Pure hypercholesterolemia, unspecified: Secondary | ICD-10-CM

## 2011-05-21 DIAGNOSIS — I10 Essential (primary) hypertension: Secondary | ICD-10-CM

## 2011-05-21 DIAGNOSIS — R0600 Dyspnea, unspecified: Secondary | ICD-10-CM

## 2011-05-21 DIAGNOSIS — E785 Hyperlipidemia, unspecified: Secondary | ICD-10-CM

## 2011-05-21 LAB — CBC WITH DIFFERENTIAL/PLATELET
Basophils Absolute: 0.3 10*3/uL — ABNORMAL HIGH (ref 0.0–0.1)
HCT: 31.9 % — ABNORMAL LOW (ref 36.0–46.0)
Lymphs Abs: 4.1 10*3/uL — ABNORMAL HIGH (ref 0.7–4.0)
Monocytes Absolute: 0.5 10*3/uL (ref 0.1–1.0)
Monocytes Relative: 6.6 % (ref 3.0–12.0)
Neutrophils Relative %: 37.4 % — ABNORMAL LOW (ref 43.0–77.0)
Platelets: 264 10*3/uL (ref 150.0–400.0)
RDW: 14 % (ref 11.5–14.6)

## 2011-05-21 LAB — BASIC METABOLIC PANEL
CO2: 29 mEq/L (ref 19–32)
Calcium: 10 mg/dL (ref 8.4–10.5)
Creatinine, Ser: 0.8 mg/dL (ref 0.4–1.2)
GFR: 90.2 mL/min (ref >60.00)

## 2011-05-21 LAB — PROTIME-INR: INR: 1 ratio (ref 0.8–1.0)

## 2011-05-21 MED ORDER — PRAVASTATIN SODIUM 80 MG PO TABS: 80.0000 mg | ORAL_TABLET | Freq: Every evening | ORAL | Status: DC

## 2011-05-21 MED ORDER — SODIUM CHLORIDE 0.9 % IV SOLN: 250.0000 mL | INTRAVENOUS | Status: DC | PRN

## 2011-05-21 MED ORDER — ATORVASTATIN CALCIUM 40 MG PO TABS: 40.0000 mg | ORAL_TABLET | Freq: Every day | ORAL | Status: DC

## 2011-05-21 MED ORDER — SODIUM CHLORIDE 0.9 % IJ SOLN: 3.0000 mL | Freq: Two times a day (BID) | INTRAMUSCULAR | Status: DC

## 2011-05-21 MED ORDER — SODIUM CHLORIDE 0.9 % IJ SOLN: 3.0000 mL | INTRAMUSCULAR | Status: DC | PRN

## 2011-05-21 NOTE — Assessment & Plan Note (Signed)
Multiple coronary risk factors: DM, smoking, hyperlipidemia, HTN.  She has exertional chest pain relieved by rest.  This sounds like angina; pre-test probability for CAD is quite high.  I am going to set her up for a left heart cath to evaluate.  She will need to continue ASA 81 mg daily, statin, and beta blocker for now.

## 2011-05-21 NOTE — Assessment & Plan Note (Signed)
LDL is very high.  We are limited in ability to use simvastatin given the fact that she is on amlodipine 5 mg bid.  She cannot afford atorvastatin (buys meds out of pocket).  I do not think that pravastatin is going to be adequate.  I will continue pravastatin but increase it to 80 mg daily for now.  We will need to work on getting her Crestor or atorvastatin througth the respective companies' patient assistance programs.

## 2011-05-21 NOTE — Assessment & Plan Note (Signed)
It is imperative for her to quit smoking.  We had a long discussion about this today.  She cannot afford nicotine patches or wellbutrin (has to buy meds out of pocket).

## 2011-05-21 NOTE — Assessment & Plan Note (Signed)
Patient appears to have mild supine hypertension with orthostatic hypotension.  She was mildly orthostatic today; it seems less marked than at times in the past.  I am not going to adjust her BP meds today.  Could consider trial of pyridostigmine in the future.  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

## 2011-05-21 NOTE — Patient Instructions (Addendum)
Increase pravastatin to 80mg  daily in the evening.    Your physician recommends that you have  lab work today--BMET/CBC/PT 786.50  401.9  272.0  Your physician has requested that you have a cardiac catheterization. Cardiac catheterization is used to diagnose and/or treat various heart conditions. Doctors may recommend this procedure for a number of different reasons. The most common reason is to evaluate chest pain. Chest pain can be a symptom of coronary artery disease (CAD), and cardiac catheterization can show whether plaque is narrowing or blocking your heart's arteries. This procedure is also used to evaluate the valves, as well as measure the blood flow and oxygen levels in different parts of your heart. For further information please visit [email or web address]. Please follow instruction sheet, as given.    Your physician recommends that you schedule a follow-up appointment with Dr Aundra Dubin about 2 weeks after the cath December 10,2012.

## 2011-05-21 NOTE — Assessment & Plan Note (Signed)
She does not appear volume overloaded on exam.  Ischemic diastolic dysfunction is certainly a possibility.  COPD given long-term smoking is also possible.

## 2011-05-21 NOTE — Progress Notes (Signed)
PCP: Dr. Ernestina Patches  55 yo with history of HTN, hyperlipidemia, diabetes, and smoking presents for evaluation of chest pain.  Patient has had chest pressure and tightness episodes for about a year now.  With exertion, she feels like her chest is "being squeezed."  She notes the chest tightness with moderate exertion such as trying to walk about 100 yards or going up a flight of steps.  She has quit trying to walk in the park because she will always get chest tightness and cannot make it to the pond where she likes to fish.  The pain will always resolve with rest.  Episodes occur on most days.  She does not get chest pain at rest.  No radiation.  She is short of breath with short distances walking.  She gets some dyspnea just walking in her house.  She is very limited by her chest pain and dyspnea.  She additionally has had trouble with labile blood pressure.  BP meds have had to be adjusted because of orthostatic hypotension.  Lying BP today was elevated at 145/77.  However, she was mildly orthostatic with standing with drop in BP to 125/74. LDL cholesterol has been very high.   Patient continues to smoke 1/2 ppd.  Labs (10/12): LDL 198, HDL 35, TGs 195 Labs (11/12): creatinine 1.12, hgbA1c 9.8  ECG: NSR, LVH  PMH: 1. HTN 2. Hyperlipidemia 3. Diabetes mellitus type II, on insulin.  4. Active smoker  FH: Brother with MI in his 36s, mother with MI in her 34s, father with MI at 6.   SH: Smokes 1/2 ppd.  Married, lives in [place].  Working on disability.   ROS: All systems reviewed and negative except as per HPI.   Current Outpatient Prescriptions  Medication Sig Dispense Refill  . amLODipine (NORVASC) 10 MG tablet Take 5 mg by mouth 2 (two) times daily.        . ARIPiprazole (ABILIFY) 10 MG tablet Take 20 mg by mouth daily.       [name] aspirin 81 MG tablet Take 1 tablet (81 mg total) by mouth daily.  30 tablet  11  . gabapentin (NEURONTIN) 100 MG capsule Take 1 capsule (100 mg total) by mouth 3  (three) times daily.  90 capsule  2  . insulin aspart (NOVOLOG) 100 UNIT/ML injection Inject 3-5 Units into the skin 3 (three) times daily before meals. Take 3-5 units. 3 units with small meals, 5 units with bigger meals. Can also take novolog immediately after meals  1 vial  3  . insulin NPH (NOVOLIN N) 100 UNIT/ML injection 10 units twice daily  10 mL  3  . Insulin Pen Needle (B-D ULTRAFINE III SHORT PEN) 31G X 8 MM MISC by Does not apply route. Use as directed       . Insulin Syringe-Needle U-100 (B-D INS SYR MICROFINE 1CC/27G) 27G X 5/8" 1 ML MISC by Does not apply route. Use as directed       . lisinopril-hydrochlorothiazide (PRINZIDE,ZESTORETIC) 20-12.5 MG per tablet Take 2 tablets by mouth daily.  60 tablet  3  . metoclopramide (REGLAN) 10 MG tablet 10 mg. 1 tab by mouth 10 mins prior to meals       . metoprolol tartrate (LOPRESSOR) 12.5 mg TABS Take 0.5 tablets (12.5 mg total) by mouth 2 (two) times daily.  30 tablet  3  . mirtazapine (REMERON) 30 MG tablet Take 1 tablet (30 mg total) by mouth at bedtime.  30 tablet  2  .  omeprazole (PRILOSEC) 20 MG capsule Take 1 capsule (20 mg total) by mouth daily.      [name] DISCONTD: pravastatin (PRAVACHOL) 40 MG tablet Take 1 tablet (40 mg total) by mouth every evening.  30 tablet  11  . atorvastatin (LIPITOR) 40 MG tablet Take 1 tablet (40 mg total) by mouth daily.  30 tablet  3  . nitroGLYCERIN (NITROSTAT) 0.3 MG SL tablet Place 0.3 mg under the tongue every 5 (five) minutes as needed. May take up to 3 times every 5 minutes if needed for chest pain       . pravastatin (PRAVACHOL) 80 MG tablet Take 1 tablet (80 mg total) by mouth every evening.  30 tablet  6  . DISCONTD: omeprazole (PRILOSEC) 40 MG capsule Take 1 capsule (40 mg total) by mouth daily.  30 capsule  3   Current Facility-Administered Medications  Medication Dose Route Frequency Provider Last Rate Last Dose  . 0.9 %  sodium chloride infusion  250 mL Intravenous PRN [name], MD      .  sodium chloride 0.9 % injection 3 mL  3 mL Intravenous Q12H [name], MD      . sodium chloride 0.9 % injection 3 mL  3 mL Intravenous PRN [name], MD        BP 142/84  Pulse 81  Ht 5\' 2"  (1.575 m)  Wt 63.504 kg (140 lb)  BMI 25.61 kg/m2 General: NAD Neck: No JVD, no thyromegaly or thyroid nodule.  Lungs: Clear to auscultation bilaterally with normal respiratory effort. CV: Nondisplaced PMI.  Heart regular S1/S2, no S3/S4, no murmur.  No peripheral edema.  No carotid bruit.  Normal pedal pulses.  Abdomen: Soft, nontender, no hepatosplenomegaly, no distention.  Skin: Intact without lesions or rashes.  Neurologic: Alert and oriented x 3.  Psych: Normal affect. Extremities: No clubbing or cyanosis.  HEENT: Normal.

## 2011-05-22 ENCOUNTER — Encounter (HOSPITAL_COMMUNITY): Payer: Self-pay

## 2011-05-22 ENCOUNTER — Encounter: Payer: Self-pay | Admitting: Family Medicine

## 2011-05-22 ENCOUNTER — Ambulatory Visit (INDEPENDENT_AMBULATORY_CARE_PROVIDER_SITE_OTHER): Payer: Self-pay | Admitting: Family Medicine

## 2011-05-22 DIAGNOSIS — E119 Type 2 diabetes mellitus without complications: Secondary | ICD-10-CM

## 2011-05-22 DIAGNOSIS — R079 Chest pain, unspecified: Secondary | ICD-10-CM

## 2011-05-22 NOTE — Assessment & Plan Note (Signed)
Longstanding issue. Multiple risk factors. Pending cardiac catheterization on 05/26/11. Appreciate cards assessment.

## 2011-05-22 NOTE — Patient Instructions (Signed)
It was good to see you today  Increase you NPH to 13 units twice a day Be sure to drink glucerna in between meals Come back to see me in 1 week Call with any questions,  God Bless, Shanda Howells MD

## 2011-05-22 NOTE — Progress Notes (Signed)
  Subjective:    Patient ID: [name], female    DOB: 07-04-1955, 55 y.o.   MRN: [id number]  HPI Patient is here for general followup visit. Patient is known to have multiple medical problems. Of note, patient was seen by the cardiologist Dr. Algernon Huxley. Given multiple cardiac risk factors patient is scheduled for a cardiac catheterization on 05/26/11. No chest pain currently. Other medication changes performed by cardiology include next is Pravachol with pending likely switch to [facility] statin pending financial assistance. DM: Checking CBGs?:YES How Often?:2-3/DAY Blood Sugar Range:200-400 Polyuria, polydypsia, polyphagia:NO Symptomatic Hypoglycemia:NO Medication Compliance?:YES; PT CURRENTLY TAKING NPH 12 UNITS BID AND SLIDING SCALE UP TO 5 UNITS WITH MEALS. ACE if hypertensive/obesity?:YES Statin if LDL >100?:YES    Review of Systems See HPI , otherwise 12 point ROS negative    Objective:   Physical Exam Gen: up in chair, NAD  HEENT: NCAT, EOMI, TMs clear bilaterally, + dental caries and R lower molar dental abscess; improved  CV: RRR, no murmurs auscultated  PULM: CTAB, no wheezes, rales, rhoncii  ABD: S/NT/+ bowel sounds  EXT: 2+ peripheral pulses  Neuro: CN II-XII grossly intact.    Assessment & Plan:

## 2011-05-22 NOTE — Assessment & Plan Note (Signed)
Will increase NPH to 13 units BID. Instructed pt to take glucerna between as to help regulate CBGs. Will follow up in 1 week.

## 2011-05-23 ENCOUNTER — Ambulatory Visit (AMBULATORY_SURGERY_CENTER): Payer: Self-pay | Admitting: *Deleted

## 2011-05-23 VITALS — Ht 62.0 in | Wt 139.2 lb

## 2011-05-23 DIAGNOSIS — Z1211 Encounter for screening for malignant neoplasm of colon: Secondary | ICD-10-CM

## 2011-05-23 MED ORDER — PEG-KCL-NACL-NASULF-NA ASC-C 100 G PO SOLR: ORAL | Status: DC

## 2011-05-23 NOTE — Progress Notes (Signed)
Pt takes several meds for anxiety, depression, and bipolar disorder.  Rescheduled colonoscopy w/ propofol. Ulice Dash

## 2011-05-26 ENCOUNTER — Ambulatory Visit (HOSPITAL_COMMUNITY): Admit: 2011-05-26 | Payer: Self-pay | Admitting: Cardiology

## 2011-05-26 ENCOUNTER — Encounter (HOSPITAL_COMMUNITY): Payer: Self-pay

## 2011-05-26 ENCOUNTER — Ambulatory Visit (HOSPITAL_COMMUNITY)
Admission: RE | Admit: 2011-05-26 | Discharge: 2011-05-26 | Disposition: A | Discharge status: Home / Self Care | Payer: Self-pay | Source: Ambulatory Visit | Attending: Cardiology | Admitting: Cardiology

## 2011-05-26 ENCOUNTER — Encounter (HOSPITAL_COMMUNITY)
Admission: RE | Disposition: A | Discharge status: Home / Self Care | Payer: Self-pay | Source: Ambulatory Visit | Attending: Cardiology

## 2011-05-26 DIAGNOSIS — R0602 Shortness of breath: Secondary | ICD-10-CM | POA: Insufficient documentation

## 2011-05-26 DIAGNOSIS — I10 Essential (primary) hypertension: Secondary | ICD-10-CM | POA: Insufficient documentation

## 2011-05-26 DIAGNOSIS — G629 Polyneuropathy, unspecified: Secondary | ICD-10-CM

## 2011-05-26 DIAGNOSIS — Z1211 Encounter for screening for malignant neoplasm of colon: Secondary | ICD-10-CM

## 2011-05-26 DIAGNOSIS — F17200 Nicotine dependence, unspecified, uncomplicated: Secondary | ICD-10-CM | POA: Insufficient documentation

## 2011-05-26 DIAGNOSIS — K219 Gastro-esophageal reflux disease without esophagitis: Secondary | ICD-10-CM

## 2011-05-26 DIAGNOSIS — R079 Chest pain, unspecified: Secondary | ICD-10-CM | POA: Insufficient documentation

## 2011-05-26 DIAGNOSIS — E119 Type 2 diabetes mellitus without complications: Secondary | ICD-10-CM | POA: Insufficient documentation

## 2011-05-26 DIAGNOSIS — E785 Hyperlipidemia, unspecified: Secondary | ICD-10-CM | POA: Insufficient documentation

## 2011-05-26 HISTORY — PX: LEFT HEART CATHETERIZATION WITH CORONARY ANGIOGRAM: SHX5451

## 2011-05-26 LAB — GLUCOSE, CAPILLARY
Glucose-Capillary: 200 mg/dL — ABNORMAL HIGH (ref 70–99)
Glucose-Capillary: 190 mg/dL — ABNORMAL HIGH (ref 70–99)

## 2011-05-26 SURGERY — LEFT HEART CATHETERIZATION WITH CORONARY ANGIOGRAM
Anesthesia: LOCAL

## 2011-05-26 MED ORDER — NITROGLYCERIN 0.2 MG/ML ON CALL CATH LAB
INTRAVENOUS | Status: AC
  Filled 2011-05-26: qty 1

## 2011-05-26 MED ORDER — HEPARIN (PORCINE) IN NACL 2-0.9 UNIT/ML-% IJ SOLN
INTRAMUSCULAR | Status: AC
  Filled 2011-05-26: qty 1000

## 2011-05-26 MED ORDER — ONDANSETRON HCL 4 MG/2ML IJ SOLN: 4.0000 mg | Freq: Four times a day (QID) | INTRAMUSCULAR | Status: DC | PRN

## 2011-05-26 MED ORDER — SODIUM CHLORIDE 0.9 % IV SOLN: INTRAVENOUS | Status: AC

## 2011-05-26 MED ORDER — FENTANYL CITRATE 0.05 MG/ML IJ SOLN
INTRAMUSCULAR | Status: AC
  Filled 2011-05-26: qty 2

## 2011-05-26 MED ORDER — VERAPAMIL HCL 2.5 MG/ML IV SOLN
INTRAVENOUS | Status: AC
  Filled 2011-05-26: qty 2

## 2011-05-26 MED ORDER — HEPARIN SODIUM (PORCINE) 1000 UNIT/ML IJ SOLN
INTRAMUSCULAR | Status: AC
  Filled 2011-05-26: qty 1

## 2011-05-26 MED ORDER — MIDAZOLAM HCL 2 MG/2ML IJ SOLN
INTRAMUSCULAR | Status: AC
  Filled 2011-05-26: qty 2

## 2011-05-26 MED ORDER — SODIUM CHLORIDE 0.9 % IV SOLN
INTRAVENOUS | Status: DC
  Administered 2011-05-26: 12:00:00 via INTRAVENOUS

## 2011-05-26 MED ORDER — ASPIRIN 81 MG PO CHEW
CHEWABLE_TABLET | ORAL | Status: AC
  Filled 2011-05-26: qty 4

## 2011-05-26 MED ORDER — LIDOCAINE HCL (PF) 1 % IJ SOLN
INTRAMUSCULAR | Status: AC
  Filled 2011-05-26: qty 30

## 2011-05-26 MED ORDER — METOCLOPRAMIDE HCL 10 MG PO TABS
10.0000 mg | ORAL_TABLET | Freq: Once | ORAL | Status: AC
  Administered 2011-05-26: 10 mg via ORAL
  Filled 2011-05-26: qty 1

## 2011-05-26 MED ORDER — ACETAMINOPHEN 325 MG PO TABS: 650.0000 mg | ORAL_TABLET | ORAL | Status: DC | PRN

## 2011-05-26 MED ORDER — ASPIRIN 81 MG PO CHEW
324.0000 mg | CHEWABLE_TABLET | ORAL | Status: AC
  Administered 2011-05-26: 324 mg via ORAL

## 2011-05-26 NOTE — H&P (View-Only) (Signed)
PCP: Dr. Ernestina Patches  55 yo with history of HTN, hyperlipidemia, diabetes, and smoking presents for evaluation of chest pain.  Patient has had chest pressure and tightness episodes for about a year now.  With exertion, she feels like her chest is "being squeezed."  She notes the chest tightness with moderate exertion such as trying to walk about 100 yards or going up a flight of steps.  She has quit trying to walk in the park because she will always get chest tightness and cannot make it to the pond where she likes to fish.  The pain will always resolve with rest.  Episodes occur on most days.  She does not get chest pain at rest.  No radiation.  She is short of breath with short distances walking.  She gets some dyspnea just walking in her house.  She is very limited by her chest pain and dyspnea.  She additionally has had trouble with labile blood pressure.  BP meds have had to be adjusted because of orthostatic hypotension.  Lying BP today was elevated at 145/77.  However, she was mildly orthostatic with standing with drop in BP to 125/74. LDL cholesterol has been very high.   Patient continues to smoke 1/2 ppd.  Labs (10/12): LDL 198, HDL 35, TGs 195 Labs (11/12): creatinine 1.12, hgbA1c 9.8  ECG: NSR, LVH  PMH: 1. HTN 2. Hyperlipidemia 3. Diabetes mellitus type II, on insulin.  4. Active smoker  FH: Brother with MI in his 31s, mother with MI in her 52s, father with MI at 38.   SH: Smokes 1/2 ppd.  Married, lives in [place].  Working on disability.   ROS: All systems reviewed and negative except as per HPI.   Current Outpatient Prescriptions  Medication Sig Dispense Refill  . amLODipine (NORVASC) 10 MG tablet Take 5 mg by mouth 2 (two) times daily.        . ARIPiprazole (ABILIFY) 10 MG tablet Take 20 mg by mouth daily.       [name] aspirin 81 MG tablet Take 1 tablet (81 mg total) by mouth daily.  30 tablet  11  . gabapentin (NEURONTIN) 100 MG capsule Take 1 capsule (100 mg total) by mouth 3  (three) times daily.  90 capsule  2  . insulin aspart (NOVOLOG) 100 UNIT/ML injection Inject 3-5 Units into the skin 3 (three) times daily before meals. Take 3-5 units. 3 units with small meals, 5 units with bigger meals. Can also take novolog immediately after meals  1 vial  3  . insulin NPH (NOVOLIN N) 100 UNIT/ML injection 10 units twice daily  10 mL  3  . Insulin Pen Needle (B-D ULTRAFINE III SHORT PEN) 31G X 8 MM MISC by Does not apply route. Use as directed       . Insulin Syringe-Needle U-100 (B-D INS SYR MICROFINE 1CC/27G) 27G X 5/8" 1 ML MISC by Does not apply route. Use as directed       . lisinopril-hydrochlorothiazide (PRINZIDE,ZESTORETIC) 20-12.5 MG per tablet Take 2 tablets by mouth daily.  60 tablet  3  . metoclopramide (REGLAN) 10 MG tablet 10 mg. 1 tab by mouth 10 mins prior to meals       . metoprolol tartrate (LOPRESSOR) 12.5 mg TABS Take 0.5 tablets (12.5 mg total) by mouth 2 (two) times daily.  30 tablet  3  . mirtazapine (REMERON) 30 MG tablet Take 1 tablet (30 mg total) by mouth at bedtime.  30 tablet  2  .  omeprazole (PRILOSEC) 20 MG capsule Take 1 capsule (20 mg total) by mouth daily.      [name] DISCONTD: pravastatin (PRAVACHOL) 40 MG tablet Take 1 tablet (40 mg total) by mouth every evening.  30 tablet  11  . atorvastatin (LIPITOR) 40 MG tablet Take 1 tablet (40 mg total) by mouth daily.  30 tablet  3  . nitroGLYCERIN (NITROSTAT) 0.3 MG SL tablet Place 0.3 mg under the tongue every 5 (five) minutes as needed. May take up to 3 times every 5 minutes if needed for chest pain       . pravastatin (PRAVACHOL) 80 MG tablet Take 1 tablet (80 mg total) by mouth every evening.  30 tablet  6  . DISCONTD: omeprazole (PRILOSEC) 40 MG capsule Take 1 capsule (40 mg total) by mouth daily.  30 capsule  3   Current Facility-Administered Medications  Medication Dose Route Frequency Provider Last Rate Last Dose  . 0.9 %  sodium chloride infusion  250 mL Intravenous PRN [name], MD      .  sodium chloride 0.9 % injection 3 mL  3 mL Intravenous Q12H [name], MD      . sodium chloride 0.9 % injection 3 mL  3 mL Intravenous PRN [name], MD        BP 142/84  Pulse 81  Ht 5\' 2"  (1.575 m)  Wt 63.504 kg (140 lb)  BMI 25.61 kg/m2 General: NAD Neck: No JVD, no thyromegaly or thyroid nodule.  Lungs: Clear to auscultation bilaterally with normal respiratory effort. CV: Nondisplaced PMI.  Heart regular S1/S2, no S3/S4, no murmur.  No peripheral edema.  No carotid bruit.  Normal pedal pulses.  Abdomen: Soft, nontender, no hepatosplenomegaly, no distention.  Skin: Intact without lesions or rashes.  Neurologic: Alert and oriented x 3.  Psych: Normal affect. Extremities: No clubbing or cyanosis.  HEENT: Normal.

## 2011-05-26 NOTE — Interval H&P Note (Signed)
History and Physical Interval Note:  05/26/2011 1:52 PM  [name]  has presented today for surgery, with the diagnosis of exertional chest pain  The various methods of treatment have been discussed with the patient and family. After consideration of risks, benefits and other options for treatment, the patient has consented to  Procedure(s): [place] as a surgical intervention .  The patients' history has been reviewed, patient examined, no change in status, stable for surgery.  I have reviewed the patients' chart and labs.  Questions were answered to the patient's satisfaction.     [place] [employer]

## 2011-05-26 NOTE — Procedures (Signed)
Cardiac Catheterization Procedure Note  Name: [name] MRN: [id number] DOB: January 16, 1956  Procedure: Left Heart Cath, Selective Coronary Angiography, LV angiography  Indication: Exertional chest pain in patient with HTN, hyperlipidemia, DM, smoking.    Procedural details: Allen's test on the right wrist was negative so femoral access was used.  The right groin was prepped, draped, and anesthetized with 1% lidocaine. Using modified Seldinger technique, a 5 French sheath was introduced into the right femoral artery. MP catheter was used to engage the RCA and enter the LV.  JL-4 catheter used to engage the LCA. Catheter exchanges were performed over a guidewire. There were no immediate procedural complications. The patient was transferred to the post catheterization recovery area for further monitoring.  Procedural Findings: Hemodynamics:  AO 143/68 LV 137/9   Coronary angiography: Coronary dominance: right  Left mainstem: No angiographic CAD  Left anterior descending (LAD): No angiographic CAD  Left circumflex (LCx): No angiographic CAD  Right coronary artery (RCA): No angiographic CAD  Left ventriculography: Left ventricular systolic function is normal, LVEF is estimated at 65%, there is no significant mitral regurgitation   Final Conclusions:  No angiographic CAD.  Normal LV systolic function.  Exertional chest pain could be secondary to microvascular disease given history of diabetes and smoking.   [name] 05/26/2011, 2:25 PM

## 2011-05-29 ENCOUNTER — Other Ambulatory Visit: Payer: Self-pay | Admitting: Internal Medicine

## 2011-05-30 ENCOUNTER — Ambulatory Visit: Payer: Self-pay | Admitting: Family Medicine

## 2011-06-04 ENCOUNTER — Ambulatory Visit (INDEPENDENT_AMBULATORY_CARE_PROVIDER_SITE_OTHER): Payer: Self-pay | Admitting: Cardiology

## 2011-06-04 ENCOUNTER — Ambulatory Visit: Payer: Self-pay | Admitting: Cardiology

## 2011-06-04 ENCOUNTER — Encounter: Payer: Self-pay | Admitting: Cardiology

## 2011-06-04 VITALS — BP 154/87 | HR 91 | Ht 62.0 in | Wt 140.8 lb

## 2011-06-04 DIAGNOSIS — F17200 Nicotine dependence, unspecified, uncomplicated: Secondary | ICD-10-CM

## 2011-06-04 DIAGNOSIS — E785 Hyperlipidemia, unspecified: Secondary | ICD-10-CM

## 2011-06-04 DIAGNOSIS — R079 Chest pain, unspecified: Secondary | ICD-10-CM

## 2011-06-04 DIAGNOSIS — I10 Essential (primary) hypertension: Secondary | ICD-10-CM

## 2011-06-04 NOTE — Assessment & Plan Note (Signed)
No angiographic CAD despite typical symptoms and multiple risk factors.  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  In this case, she needs to quit smoking and to have lipids controlled by a statin.

## 2011-06-04 NOTE — Assessment & Plan Note (Signed)
LDL is very high.  She can only afford pravastatin at this time with no insurance.  When she gets Medicaid, I would like her to start on atorvastatin 40 mg dail (should be covered).

## 2011-06-04 NOTE — Progress Notes (Signed)
PCP: Dr. Ernestina Patches  55 yo with history of HTN, hyperlipidemia, diabetes, and smoking presented initially for evaluation of chest pain.  Patient has had chest pressure and tightness episodes for about a year now.  With exertion, she feels like her chest is "being squeezed."  She notes the chest tightness with moderate exertion such as trying to walk about 100 yards or going up a flight of steps.  She has quit trying to walk in the park because she will always get chest tightness and cannot make it to the pond where she likes to fish.  The pain will always resolve with rest.  Episodes occur on most days.  She does not get chest pain at rest.  No radiation.  She is short of breath with short distances walking.  She gets some dyspnea just walking in her house.  She is very limited by her chest pain and dyspnea.  She additionally has had trouble with labile blood pressure.  BP meds have had to be adjusted because of orthostatic hypotension.    Given her risk factors and exertional chest pain, I decided to do a left heart cath.  This showed no angiographic CAD and normal EF.  I speculated that her symptoms could be due to microvascular angina.  Her symptoms continue about the same.  She still has some lightheadedness with standing but systolic BP only dropped about 10 mmHg today with standing.  She is still awaiting Medicaid so is only on pravastatin at this time despite very high LDL.   Patient continues to smoke 1/2 ppd.  Labs (10/12): LDL 198, HDL 35, TGs 195 Labs (11/12): creatinine 1.12, hgbA1c 9.8 Labs (12/12): K 3.8, creatinine 0.8  ECG: NSR, LVH  PMH: 1. HTN 2. Hyperlipidemia 3. Diabetes mellitus type II, on insulin.  4. Active smoker 5. Chest pain: Left heart cath (12/12) with no angiographic CAD, EF 65%.  ? Microvascular angina.  6. Orthostatic hypotension with supine hypertension.   FH: Brother with MI in his 36s, mother with MI in her 90s, father with MI at 24.   SH: Smokes 1/2 ppd.   Married, lives in [place].  Working on disability.    Current Outpatient Prescriptions  Medication Sig Dispense Refill  . amLODipine (NORVASC) 10 MG tablet Take 10 mg by mouth daily.       . ARIPiprazole (ABILIFY) 10 MG tablet Take 20 mg by mouth daily.       [name] aspirin 81 MG tablet Take 1 tablet (81 mg total) by mouth daily.  30 tablet  11  . gabapentin (NEURONTIN) 100 MG capsule Take 1 capsule (100 mg total) by mouth 3 (three) times daily.  90 capsule  2  . insulin aspart (NOVOLOG) 100 UNIT/ML injection Inject 3-5 Units into the skin 3 (three) times daily before meals. Take 3-5 units. 3 units with small meals, 5 units with bigger meals. Can also take novolog immediately after meals  1 vial  3  . insulin NPH (HUMULIN N,NOVOLIN N) 100 UNIT/ML injection Inject 12 Units into the skin 2 (two) times daily.        . Insulin Pen Needle (B-D ULTRAFINE III SHORT PEN) 31G X 8 MM MISC by Does not apply route. Use as directed       . Insulin Syringe-Needle U-100 (B-D INS SYR MICROFINE 1CC/27G) 27G X 5/8" 1 ML MISC by Does not apply route. Use as directed       . metoclopramide (REGLAN) 10 MG tablet Take 10  mg by mouth 3 (three) times daily as needed. 1 tab by mouth 10 mins prior to meals      . metoprolol tartrate (LOPRESSOR) 12.5 mg TABS Take 6.25 mg by mouth 2 (two) times daily.        . mirtazapine (REMERON) 30 MG tablet Take 1 tablet (30 mg total) by mouth at bedtime.  30 tablet  2  . omeprazole (PRILOSEC) 20 MG capsule Take 1 capsule (20 mg total) by mouth daily.      . peg 3350 powder (MOVIPREP) 100 G SOLR moviprep as directed  1 kit  0  . pravastatin (PRAVACHOL) 80 MG tablet Take 1 tablet (80 mg total) by mouth every evening.  30 tablet  6  . traZODone (DESYREL) 100 MG tablet Take 100 mg by mouth at bedtime.        [name] DISCONTD: lisinopril-hydrochlorothiazide (PRINZIDE,ZESTORETIC) 20-12.5 MG per tablet Take 2 tablets by mouth daily.  60 tablet  3  . lisinopril-hydrochlorothiazide  (PRINZIDE,ZESTORETIC) 20-12.5 MG per tablet Take 2 tablets daily      . DISCONTD: omeprazole (PRILOSEC) 40 MG capsule Take 1 capsule (40 mg total) by mouth daily.  30 capsule  3   Current Facility-Administered Medications  Medication Dose Route Frequency Provider Last Rate Last Dose  . sodium chloride 0.9 % injection 3 mL  3 mL Intravenous Q12H [name], MD      . sodium chloride 0.9 % injection 3 mL  3 mL Intravenous PRN [name], MD        BP 154/87  Pulse 91  Ht 5\' 2"  (1.575 m)  Wt 63.866 kg (140 lb 12.8 oz)  BMI 25.75 kg/m2  SpO2 98% General: NAD Neck: No JVD, no thyromegaly or thyroid nodule.  Lungs: Clear to auscultation bilaterally with normal respiratory effort. CV: Nondisplaced PMI.  Heart regular S1/S2, no S3/S4, no murmur.  No peripheral edema.  No carotid bruit.  Normal pedal pulses.  Abdomen: Soft, nontender, no hepatosplenomegaly, no distention.  Skin: Intact without lesions or rashes.  Neurologic: Alert and oriented x 3.  Psych: Normal affect. Extremities: No clubbing or cyanosis.  HEENT: Normal.

## 2011-06-04 NOTE — Assessment & Plan Note (Signed)
Orthostatic hypotension with supine hypertension.  She still is lightheaded with standing though BP did not drop too much with standing today.  Her supine BP is high.  I will continue the current meds, no changes.  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

## 2011-06-04 NOTE — Patient Instructions (Addendum)
   Your physician wants you to follow-up in: 6 months with Dr Aundra Dubin. (June 2013). You will receive a reminder letter in the mail two months in advance. If you don't receive a letter, please call our office to schedule the follow-up appointment.

## 2011-06-04 NOTE — Assessment & Plan Note (Signed)
I again counseled her to quit.  This can make chest pain with microvascular angina worse.

## 2011-06-06 ENCOUNTER — Encounter: Payer: Self-pay | Admitting: Family Medicine

## 2011-06-06 ENCOUNTER — Ambulatory Visit (INDEPENDENT_AMBULATORY_CARE_PROVIDER_SITE_OTHER): Payer: Self-pay | Admitting: Family Medicine

## 2011-06-06 DIAGNOSIS — I10 Essential (primary) hypertension: Secondary | ICD-10-CM

## 2011-06-06 DIAGNOSIS — R079 Chest pain, unspecified: Secondary | ICD-10-CM

## 2011-06-06 DIAGNOSIS — E119 Type 2 diabetes mellitus without complications: Secondary | ICD-10-CM

## 2011-06-06 NOTE — Progress Notes (Signed)
S:  Pt is here for general medical follow up.  Pt is noted to have had a recent cardiac catheterization in setting of multiple cardiac RFs and typical CP. Cardiac catheretization was negative for coronary disease. Normal EF.  HTN:  Checking BPS Daily ?:yes BP Range:120s-160s; Pt with symptomatic orthostatic hypotension. Working diagnosis of diabetic autonomic neuropathy Any HA, CP, SOB?:Pt is still having exertional dyspnea. Working dx of microvascular angina.  Any Medication Side Effects:no  Medication Compliance:yes Salt intake?:stable  Exercise?:no. Exercise seems to induce angina  Weight Loss?:stable    DM: Checking CBGs?:yes How Often?:tid Blood Sugar Range:120s-400s mainly low 200s  Polyuria, polydypsia, polyphagia:no  Symptomatic Hypoglycemia:no Medication Compliance?:yes ACE if hypertensive/obesity?:yes Statin if LDL >100?:yes; on pravachol 80.        O:  Current outpatient prescriptions:amLODipine (NORVASC) 10 MG tablet, Take 10 mg by mouth daily. , Disp: , Rfl: ;  ARIPiprazole (ABILIFY) 10 MG tablet, Take 20 mg by mouth daily. , Disp: , Rfl: ;  aspirin 81 MG tablet, Take 1 tablet (81 mg total) by mouth daily., Disp: 30 tablet, Rfl: 11;  gabapentin (NEURONTIN) 100 MG capsule, Take 1 capsule (100 mg total) by mouth 3 (three) times daily., Disp: 90 capsule, Rfl: 2 insulin aspart (NOVOLOG) 100 UNIT/ML injection, Inject 3-5 Units into the skin 3 (three) times daily before meals. Take 3-5 units. 3 units with small meals, 5 units with bigger meals. Can also take novolog immediately after meals, Disp: 1 vial, Rfl: 3;  insulin NPH (HUMULIN N,NOVOLIN N) 100 UNIT/ML injection, Inject 12 Units into the skin 2 (two) times daily.  , Disp: , Rfl:  Insulin Pen Needle (B-D ULTRAFINE III SHORT PEN) 31G X 8 MM MISC, by Does not apply route. Use as directed , Disp: , Rfl: ;  Insulin Syringe-Needle U-100 (B-D INS SYR MICROFINE 1CC/27G) 27G X 5/8" 1 ML MISC, by Does not apply route. Use as  directed , Disp: , Rfl: ;  lisinopril-hydrochlorothiazide (PRINZIDE,ZESTORETIC) 20-12.5 MG per tablet, Take 2 tablets daily, Disp: , Rfl:  metoclopramide (REGLAN) 10 MG tablet, Take 10 mg by mouth 3 (three) times daily as needed. 1 tab by mouth 10 mins prior to meals, Disp: , Rfl: ;  metoprolol tartrate (LOPRESSOR) 12.5 mg TABS, Take 6.25 mg by mouth 2 (two) times daily.  , Disp: , Rfl: ;  mirtazapine (REMERON) 30 MG tablet, Take 1 tablet (30 mg total) by mouth at bedtime., Disp: 30 tablet, Rfl: 2 omeprazole (PRILOSEC) 20 MG capsule, Take 1 capsule (20 mg total) by mouth daily., Disp: , Rfl: ;  peg 3350 powder (MOVIPREP) 100 G SOLR, moviprep as directed, Disp: 1 kit, Rfl: 0;  pravastatin (PRAVACHOL) 80 MG tablet, Take 1 tablet (80 mg total) by mouth every evening., Disp: 30 tablet, Rfl: 6;  traZODone (DESYREL) 100 MG tablet, Take 100 mg by mouth at bedtime.  , Disp: , Rfl:  DISCONTD: omeprazole (PRILOSEC) 40 MG capsule, Take 1 capsule (40 mg total) by mouth daily., Disp: 30 capsule, Rfl: 3 Current facility-administered medications:sodium chloride 0.9 % injection 3 mL, 3 mL, Intravenous, Q12H, [name], MD;  sodium chloride 0.9 % injection 3 mL, 3 mL, Intravenous, PRN, [name], MD  Wt Readings from Last 3 Encounters:  06/06/11 139 lb (63.05 kg)  06/04/11 140 lb 12.8 oz (63.866 kg)  05/26/11 139 lb (63.05 kg)   Temp Readings from Last 3 Encounters:  05/26/11 97.3 F (36.3 C) Oral  05/26/11 97.3 F (36.3 C) Oral  05/16/11 97.9 F (  36.6 C) Oral   BP Readings from Last 3 Encounters:  06/06/11 124/75  06/04/11 154/87  05/26/11 98/61   Pulse Readings from Last 3 Encounters:  06/06/11 92  06/04/11 91  05/26/11 112    General: alert and cooperative HEENT: PERRLA and extra ocular movement intact Heart: S1, S2 normal, no murmur, rub or gallop, regular rate and rhythm Lungs: clear to auscultation, no wheezes or rales and unlabored breathing Abdomen: abdomen is soft without  significant tenderness, masses, organomegaly or guarding Extremities: extremities normal, atraumatic, no cyanosis or edema Skin:no rashes Neurology: normal without focal findings, mental status, speech normal, alert and oriented x3 and PERLA   A/P:

## 2011-06-06 NOTE — Patient Instructions (Signed)
It was good to see you today  Continue with your current diabetes and hypertension regimens MAKE A NUTRITION APPOINTMENT AND AN APPT AFTER THE NEW YEAR We will be doing a pap smear at your new year follow up appointment. Be sure to drink glucerna in between meals Call with any questions,  God Bless, Shanda Howells MD

## 2011-06-11 NOTE — Assessment & Plan Note (Signed)
Improved from previous clinical visit. Will continue with current regimen. Baseline diabetic autonomic neuropathy is a complicating issue of BP management. Appreciate Cards input thus far.

## 2011-06-11 NOTE — Assessment & Plan Note (Signed)
Likely microvascular angina per cardiology in setting of negative cath. Imdur would be a consideration. However, symptomatic orthostasis (diabetic autonomic neuropathy) is a strong contraindication.

## 2011-06-11 NOTE — Assessment & Plan Note (Signed)
Will continue with current regimen. Most important issue is for pt to develop stable diet as pt eating regimen drastically changes on a daily basis. Plan for pt to go to nutrition clinic. Will follow prn.

## 2011-06-19 ENCOUNTER — Ambulatory Visit (INDEPENDENT_AMBULATORY_CARE_PROVIDER_SITE_OTHER): Payer: Self-pay | Admitting: Family Medicine

## 2011-06-19 ENCOUNTER — Encounter: Payer: Self-pay | Admitting: Family Medicine

## 2011-06-19 VITALS — BP 125/75 | HR 99 | Ht 62.0 in | Wt 142.0 lb

## 2011-06-19 DIAGNOSIS — I10 Essential (primary) hypertension: Secondary | ICD-10-CM

## 2011-06-19 DIAGNOSIS — Z0000 Encounter for general adult medical examination without abnormal findings: Secondary | ICD-10-CM

## 2011-06-19 DIAGNOSIS — E119 Type 2 diabetes mellitus without complications: Secondary | ICD-10-CM

## 2011-06-19 DIAGNOSIS — Z124 Encounter for screening for malignant neoplasm of cervix: Secondary | ICD-10-CM

## 2011-06-20 DIAGNOSIS — Z0000 Encounter for general adult medical examination without abnormal findings: Secondary | ICD-10-CM | POA: Insufficient documentation

## 2011-06-20 NOTE — Assessment & Plan Note (Signed)
Otherwise well controlled. Still with symptomatic diabetic autonomic neuropathy. Will make no medication changes today.

## 2011-06-20 NOTE — Assessment & Plan Note (Signed)
Will make no changes to regimen today. Will tolerate higher sugars as pt has brittle blood sugar tendencies. Most important issue for pt right now is to be formally evaluated by nutrition clinic as to establish proper eating pattern to help with stabilizing blood sugars. Will follow up in 2 weeks.

## 2011-06-20 NOTE — Progress Notes (Signed)
S:  Pt is here for general medical follow up.  Pt denies any acute issues apart from symptomatic diabetic autonomic neuropathy. No syncopal events per pt.  HTN:  Checking BPS Daily ?:yes BP Range:120s-160s; Pt with symptomatic orthostatic hypotension. Working diagnosis of diabetic autonomic neuropathy Any HA, CP, SOB?:Pt is still having exertional dyspnea. Working dx of microvascular angina.  Any Medication Side Effects:no  Medication Compliance:yes Salt intake?:stable  Exercise?:no. Exercise seems to induce angina  Weight Loss?:stable    DM: Checking CBGs?:yes How Often?:tid Blood Sugar Range:120s-400s mainly low 200s  Polyuria, polydypsia, polyphagia:no  Symptomatic Hypoglycemia:no Medication Compliance?:yes ACE if hypertensive/obesity?:yes Statin if LDL >100?:yes; on pravachol 80.        O:  Current outpatient prescriptions:amLODipine (NORVASC) 10 MG tablet, Take 10 mg by mouth daily. , Disp: , Rfl: ;  ARIPiprazole (ABILIFY) 10 MG tablet, Take 20 mg by mouth daily. , Disp: , Rfl: ;  aspirin 81 MG tablet, Take 1 tablet (81 mg total) by mouth daily., Disp: 30 tablet, Rfl: 11;  gabapentin (NEURONTIN) 100 MG capsule, Take 1 capsule (100 mg total) by mouth 3 (three) times daily., Disp: 90 capsule, Rfl: 2 insulin aspart (NOVOLOG) 100 UNIT/ML injection, Inject 3-5 Units into the skin 3 (three) times daily before meals. Take 3-5 units. 3 units with small meals, 5 units with bigger meals. Can also take novolog immediately after meals, Disp: 1 vial, Rfl: 3;  insulin NPH (HUMULIN N,NOVOLIN N) 100 UNIT/ML injection, Inject 13 Units into the skin 2 (two) times daily. , Disp: , Rfl:  Insulin Pen Needle (B-D ULTRAFINE III SHORT PEN) 31G X 8 MM MISC, by Does not apply route. Use as directed , Disp: , Rfl: ;  Insulin Syringe-Needle U-100 (B-D INS SYR MICROFINE 1CC/27G) 27G X 5/8" 1 ML MISC, by Does not apply route. Use as directed , Disp: , Rfl: ;  lisinopril-hydrochlorothiazide  (PRINZIDE,ZESTORETIC) 20-12.5 MG per tablet, Take 2 tablets daily, Disp: , Rfl:  metoclopramide (REGLAN) 10 MG tablet, Take 10 mg by mouth 3 (three) times daily as needed. 1 tab by mouth 10 mins prior to meals, Disp: , Rfl: ;  metoprolol tartrate (LOPRESSOR) 12.5 mg TABS, Take 6.25 mg by mouth 2 (two) times daily.  , Disp: , Rfl: ;  mirtazapine (REMERON) 30 MG tablet, Take 1 tablet (30 mg total) by mouth at bedtime., Disp: 30 tablet, Rfl: 2 omeprazole (PRILOSEC) 20 MG capsule, Take 1 capsule (20 mg total) by mouth daily., Disp: , Rfl: ;  pravastatin (PRAVACHOL) 80 MG tablet, Take 1 tablet (80 mg total) by mouth every evening., Disp: 30 tablet, Rfl: 6;  traZODone (DESYREL) 100 MG tablet, Take 100 mg by mouth at bedtime.  , Disp: , Rfl: ;  peg 3350 powder (MOVIPREP) 100 G SOLR, moviprep as directed, Disp: 1 kit, Rfl: 0 DISCONTD: omeprazole (PRILOSEC) 40 MG capsule, Take 1 capsule (40 mg total) by mouth daily., Disp: 30 capsule, Rfl: 3 Current facility-administered medications:sodium chloride 0.9 % injection 3 mL, 3 mL, Intravenous, Q12H, [name], MD;  sodium chloride 0.9 % injection 3 mL, 3 mL, Intravenous, PRN, [name], MD  Wt Readings from Last 3 Encounters:  06/19/11 142 lb (64.411 kg)  06/06/11 139 lb (63.05 kg)  06/04/11 140 lb 12.8 oz (63.866 kg)   Temp Readings from Last 3 Encounters:  05/26/11 97.3 F (36.3 C) Oral  05/26/11 97.3 F (36.3 C) Oral  05/16/11 97.9 F (36.6 C) Oral   BP Readings from Last 3 Encounters:  06/19/11  125/75  06/06/11 124/75  06/04/11 154/87   Pulse Readings from Last 3 Encounters:  06/19/11 99  06/06/11 92  06/04/11 91    General: alert and cooperative HEENT: PERRLA and extra ocular movement intact Heart: S1, S2 normal, no murmur, rub or gallop, regular rate and rhythm Lungs: clear to auscultation, no wheezes or rales and unlabored breathing Abdomen: abdomen is soft without significant tenderness, masses, organomegaly or guarding GU:  normal external genitalia, no visible cervix on speculum exam, no palpable cervix on bimanual exam Extremities: extremities normal, atraumatic, no cyanosis or edema Skin:no rashes Neurology: normal without focal findings, mental status, speech normal, alert and oriented x3 and PERLA   A/P:

## 2011-06-20 NOTE — Assessment & Plan Note (Signed)
Pt is s/p abd hysterectomy. No visible cervix on speculum or bimanual exam. Pt is pending medicaid set up for colonoscopy set up.

## 2011-07-04 ENCOUNTER — Ambulatory Visit: Payer: Self-pay | Admitting: Family Medicine

## 2011-07-10 ENCOUNTER — Ambulatory Visit (AMBULATORY_SURGERY_CENTER): Payer: Self-pay | Admitting: Internal Medicine

## 2011-07-10 ENCOUNTER — Encounter: Payer: Self-pay | Admitting: Internal Medicine

## 2011-07-10 VITALS — BP 171/79 | HR 98 | Temp 98.2°F | Resp 12 | Ht 62.0 in | Wt 139.0 lb

## 2011-07-10 DIAGNOSIS — E119 Type 2 diabetes mellitus without complications: Secondary | ICD-10-CM

## 2011-07-10 DIAGNOSIS — Z1211 Encounter for screening for malignant neoplasm of colon: Secondary | ICD-10-CM

## 2011-07-10 DIAGNOSIS — D126 Benign neoplasm of colon, unspecified: Secondary | ICD-10-CM

## 2011-07-10 DIAGNOSIS — K5730 Diverticulosis of large intestine without perforation or abscess without bleeding: Secondary | ICD-10-CM

## 2011-07-10 LAB — GLUCOSE, CAPILLARY: Glucose-Capillary: 266 mg/dL — ABNORMAL HIGH (ref 70–99)

## 2011-07-10 MED ORDER — SODIUM CHLORIDE 0.9 % IV SOLN: 500.0000 mL | INTRAVENOUS | Status: DC

## 2011-07-10 MED ORDER — INSULIN REGULAR HUMAN 100 UNIT/ML IJ SOLN
5.0000 [IU] | Freq: Once | INTRAMUSCULAR | Status: AC
  Administered 2011-07-10: 5 [IU] via SUBCUTANEOUS

## 2011-07-10 NOTE — Patient Instructions (Addendum)
Three polyps were removed. They look benign but I will send a letter after the pathologist looks at them. You also have some diverticulosis or pockets in the colon - common and not usually a problem. Gatha Mayer, MD, [facility]   See the picture page for your findings from your exam today.  Follow the green and blue discharge instruction sheets the rest of the day.  Resume your prior medications today. Please call if any questions or concerns.

## 2011-07-10 NOTE — Progress Notes (Signed)
No complaints noted in the recovery room. Maw  Reported blood sugar 266 in the recovery room to Dr. Carlean Purl.  No orders given but for the pt to resume her medication regiment at home. Maw  Patient did not experience any of the following events: a burn prior to discharge; a fall within the facility; wrong site/side/patient/procedure/implant event; or a hospital transfer or hospital admission upon discharge from the facility. [phone]) Patient did not have preoperative order for IV antibiotic SSI prophylaxis. [phone])

## 2011-07-10 NOTE — Op Note (Signed)
[facility] [address] [facility] [address] [place], [place]  [id number]  COLONOSCOPY PROCEDURE REPORT  PATIENT:  [name], [name]  MR#:  [id number] BIRTHDATE:  August 16, 1955, 26 yrs. old  GENDER:  female ENDOSCOPIST:  Gatha Mayer, MD, [facility] REF. BY:           Shanda Howells, MD ([facility]) PROCEDURE DATE:  07/10/2011 PROCEDURE:  Colonoscopy with biopsy and snare polypectomy ASA CLASS:  Class III INDICATIONS:  Routine Risk Screening MEDICATIONS:   MAC sedation, administered by CRNA, propofol (Diprivan) 300 mg IV  DESCRIPTION OF PROCEDURE:   After the risks benefits and alternatives of the procedure were thoroughly explained, informed consent was obtained.  Digital rectal exam was performed and revealed no abnormalities.   The LB 180AL [id number] endoscope was introduced through the anus and advanced to the cecum, which was identified by both the appendix and ileocecal valve, without limitations.  The quality of the prep was excellent, using MoviPrep.  The instrument was then slowly withdrawn as the colon was fully examined. <<PROCEDUREIMAGES>>  FINDINGS:  Three polyps were found in the right colon. Ascending (two) and transverse - 18mm - 6 mm. Cold snare and cold biopsy removal, all sent to pathology.  Scattered diverticula were found throughout the colon.  This was otherwise a normal examination of the colon.   Retroflexed views in the rectum revealed no abnormalities.    The time to cecum = 8:20 minutes. The scope was then withdrawn in 11:20 minutes from the cecum and the procedure completed. COMPLICATIONS:  None ENDOSCOPIC IMPRESSION: 1) Three polyps in the right colon - removed, largest 6 mm 2) Diverticula, scattered throughout the colon 3) Otherwise normal examination, excellent prep  REPEAT EXAM:  In for Colonoscopy, pending biopsy results.  Gatha Mayer, MD, [facility]  CC:  The Patient and Shanda Howells, MD  n. Lorrin Mais:   Gatha Mayer at 07/10/2011 02:53  PM  Stark Jock, [id number]

## 2011-07-10 NOTE — Progress Notes (Signed)
Propofol administered per [facility] crna. See scanned intra procedure report. ewm  Per dr Carlean Purl check blood sugar in recovery. No repeat  blood sugar needed in room. ewm

## 2011-07-11 ENCOUNTER — Telehealth: Payer: Self-pay | Admitting: *Deleted

## 2011-07-11 NOTE — Telephone Encounter (Signed)
NO ANSWER, MESSAGE LEFT FOR THE PATIENT. 

## 2011-07-15 ENCOUNTER — Encounter: Payer: Self-pay | Admitting: Internal Medicine

## 2011-07-15 NOTE — Progress Notes (Signed)
Quick Note:  3 adenomas Repeat colonoscopy approx 3 years 2016 ______

## 2011-07-17 ENCOUNTER — Encounter: Payer: Self-pay | Admitting: Family Medicine

## 2011-07-17 DIAGNOSIS — R933 Abnormal findings on diagnostic imaging of other parts of digestive tract: Secondary | ICD-10-CM | POA: Insufficient documentation

## 2011-07-18 ENCOUNTER — Encounter: Payer: Self-pay | Admitting: Family Medicine

## 2011-07-18 ENCOUNTER — Ambulatory Visit (INDEPENDENT_AMBULATORY_CARE_PROVIDER_SITE_OTHER): Payer: Medicaid Other | Admitting: Family Medicine

## 2011-07-18 VITALS — BP 153/87 | HR 85 | Ht 62.0 in | Wt 140.0 lb

## 2011-07-18 DIAGNOSIS — R209 Unspecified disturbances of skin sensation: Secondary | ICD-10-CM

## 2011-07-18 DIAGNOSIS — R202 Paresthesia of skin: Secondary | ICD-10-CM

## 2011-07-18 DIAGNOSIS — M7500 Adhesive capsulitis of unspecified shoulder: Secondary | ICD-10-CM

## 2011-07-18 DIAGNOSIS — E119 Type 2 diabetes mellitus without complications: Secondary | ICD-10-CM

## 2011-07-18 DIAGNOSIS — I10 Essential (primary) hypertension: Secondary | ICD-10-CM

## 2011-07-18 NOTE — Patient Instructions (Signed)
It was good to see you today  I am referring you to the sports medicine doctor for your shoulders I am increasing you neurontin for your hand tingling.  Take 300mg  at night and 100 mg during the day I am checking some labs Be sure to set up an appt with Dr. Valentina Lucks in the next 1-2 weeks.  Come back to see me in 2 weeks, Call with any questions,  God Bless, Shanda Howells MD

## 2011-07-18 NOTE — Progress Notes (Signed)
S:  HTN:  Checking BPS Daily ?:yes BP Range:140s-150s Any HA, CP, SOB?:yes, still with intermitent dizziness with ambulation in setting of diabetic autonomic neuropathy  Any Medication Side Effects:no Medication Compliance:yes Salt intake?:low per pt  Exercise?:walking Weight Loss?:no   DM: Checking CBGs?:yes How Often?:2-3 times per day  Blood Sugar Range:mid 100s to 200s; pt does not have log with her that she usually has Polyuria, polydypsia, polyphagia:no Symptomatic Hypoglycemia:no Medication Compliance?:yes ACE if hypertensive/obesity?:yes Statin if LDL >100?:yes       O:  Current Outpatient Prescriptions  Medication Sig Dispense Refill  . amLODipine (NORVASC) 10 MG tablet Take 10 mg by mouth daily.       . ARIPiprazole (ABILIFY) 10 MG tablet Take 20 mg by mouth daily.       [name] aspirin 81 MG tablet Take 1 tablet (81 mg total) by mouth daily.  30 tablet  11  . gabapentin (NEURONTIN) 100 MG capsule Take 1 capsule (100 mg total) by mouth 3 (three) times daily.  90 capsule  2  . insulin aspart (NOVOLOG) 100 UNIT/ML injection Inject 3-5 Units into the skin 3 (three) times daily before meals. Take 3-5 units. 3 units with small meals, 5 units with bigger meals. Can also take novolog immediately after meals  1 vial  3  . insulin NPH (HUMULIN N,NOVOLIN N) 100 UNIT/ML injection Inject 13 Units into the skin 2 (two) times daily.       . Insulin Pen Needle (B-D ULTRAFINE III SHORT PEN) 31G X 8 MM MISC by Does not apply route. Use as directed       . Insulin Syringe-Needle U-100 (B-D INS SYR MICROFINE 1CC/27G) 27G X 5/8" 1 ML MISC by Does not apply route. Use as directed       . lisinopril-hydrochlorothiazide (PRINZIDE,ZESTORETIC) 20-12.5 MG per tablet Take 2 tablets daily      . metoclopramide (REGLAN) 10 MG tablet Take 10 mg by mouth 3 (three) times daily as needed. 1 tab by mouth 10 mins prior to meals      . metoprolol tartrate (LOPRESSOR) 12.5 mg TABS Take 6.25 mg by mouth 2  (two) times daily.        . mirtazapine (REMERON) 30 MG tablet Take 1 tablet (30 mg total) by mouth at bedtime.  30 tablet  2  . omeprazole (PRILOSEC) 20 MG capsule Take 1 capsule (20 mg total) by mouth daily.      . pravastatin (PRAVACHOL) 80 MG tablet Take 1 tablet (80 mg total) by mouth every evening.  30 tablet  6  . traZODone (DESYREL) 100 MG tablet Take 100 mg by mouth at bedtime.        [name] DISCONTD: omeprazole (PRILOSEC) 40 MG capsule Take 1 capsule (40 mg total) by mouth daily.  30 capsule  3   Current Facility-Administered Medications  Medication Dose Route Frequency Provider Last Rate Last Dose  . 0.9 %  sodium chloride infusion  500 mL Intravenous Continuous Gatha Mayer, MD      . sodium chloride 0.9 % injection 3 mL  3 mL Intravenous Q12H [name], MD      . sodium chloride 0.9 % injection 3 mL  3 mL Intravenous PRN [name], MD        Wt Readings from Last 3 Encounters:  07/18/11 140 lb (63.504 kg)  07/10/11 139 lb (63.05 kg)  06/19/11 142 lb (64.411 kg)   Temp Readings from Last 3 Encounters:  07/10/11 98.2 F (  36.8 C)   05/26/11 97.3 F (36.3 C) Oral  05/26/11 97.3 F (36.3 C) Oral   BP Readings from Last 3 Encounters:  07/18/11 153/87  07/10/11 171/79  06/19/11 125/75   Pulse Readings from Last 3 Encounters:  07/18/11 85  07/10/11 98  06/19/11 99    General: alert and cooperative HEENT: PERRLA and extra ocular movement intact Heart: S1, S2 normal, no murmur, rub or gallop, regular rate and rhythm Lungs: clear to auscultation, no wheezes or rales and unlabored breathing Abdomen: abdomen is soft without significant tenderness, masses, organomegaly or guarding Extremities: extremities normal, atraumatic, no cyanosis or edema Skin:no rashes Neurology: normal without focal findings, mental status, speech normal, alert and oriented x3, PERLA and reflexes normal and symmetric   A/P:

## 2011-07-23 NOTE — Assessment & Plan Note (Addendum)
Overall goal is <130/80. However in setting of diabetic autonomic neuropathy with symptomatic orthostasis, will continue with current regimen.

## 2011-07-23 NOTE — Assessment & Plan Note (Signed)
Otherwise stable on current regimen. Will make no medication changes today

## 2011-07-24 ENCOUNTER — Ambulatory Visit (INDEPENDENT_AMBULATORY_CARE_PROVIDER_SITE_OTHER): Payer: Medicaid Other | Admitting: Family Medicine

## 2011-07-24 VITALS — BP 130/70

## 2011-07-24 DIAGNOSIS — M5412 Radiculopathy, cervical region: Secondary | ICD-10-CM

## 2011-07-24 MED ORDER — METHYLPREDNISOLONE 4 MG PO KIT: PACK | ORAL | Status: AC

## 2011-07-24 NOTE — Progress Notes (Signed)
Subjective:    Patient ID: [name], female    DOB: 17-Aug-1955, 56 y.o.   MRN: [id number]  HPI  [name] is a 56 yo female patient comes in today complaining of left shoulder left upper extremity cervical radiculopathy for the last 2 years on and off.. She is having sharp pain radiating from her neck to her left upper extremity, numbness and tingling as well. She denies any neck pain denies an injury to her neck. He is also complaining of right shoulder pain. The pain is located on the right shoulder, for an intensity, sharp pain, worsened with activities, improved by rest. She is having night pain and symptoms which wake her up. No numbness or tingling in her right upper extremity. She denies an injury to her shoulder. [name] is on  disability to to her chronic pain. She has an extensive list of comorbidities. She has had PT for her left shoulder in the past.  Patient Active Problem List  Diagnoses  . DIABETES MELLITUS, TYPE II  . DIABETIC PERIPHERAL NEUROPATHY  . HYPERLIPIDEMIA  . BIPOLAR II DISORDER  . TOBACCO USE  . TENSION HEADACHE  . DEPRESSIVE DISORDER, NOS  . UNSPECIFIED SUBJECTIVE VISUAL DISTURBANCE  . GASTROESOPHAGEAL REFLUX, NO ESOPHAGITIS  . GASTROPARESIS  . VAGINAL PRURITUS  . DYSTROPHIC NAILS  . UNSPECIFIED ARTHROPATHY MULTIPLE SITES  . Adhesive capsulitis  . PAIN IN JOINT, HAND  . Trigger finger (acquired)  . DE QUERVAIN'S TENOSYNOVITIS  . FIBROMYALGIA  . INSOMNIA NOS  . HYPERTENSION, BENIGN ESSENTIAL  . Dental abscess  . Chest pain  . Exertional dyspnea  . Annual physical exam  . Abnormal colonoscopy   Current Outpatient Prescriptions on File Prior to Visit  Medication Sig Dispense Refill  . amLODipine (NORVASC) 10 MG tablet Take 10 mg by mouth daily.       . ARIPiprazole (ABILIFY) 10 MG tablet Take 20 mg by mouth daily.       [name] aspirin 81 MG tablet Take 1 tablet (81 mg total) by mouth daily.  30 tablet  11  . gabapentin (NEURONTIN) 100 MG  capsule Take 1 capsule (100 mg total) by mouth 3 (three) times daily.  90 capsule  2  . insulin aspart (NOVOLOG) 100 UNIT/ML injection Inject 3-5 Units into the skin 3 (three) times daily before meals. Take 3-5 units. 3 units with small meals, 5 units with bigger meals. Can also take novolog immediately after meals  1 vial  3  . insulin NPH (HUMULIN N,NOVOLIN N) 100 UNIT/ML injection Inject 13 Units into the skin 2 (two) times daily.       . Insulin Pen Needle (B-D ULTRAFINE III SHORT PEN) 31G X 8 MM MISC by Does not apply route. Use as directed       . Insulin Syringe-Needle U-100 (B-D INS SYR MICROFINE 1CC/27G) 27G X 5/8" 1 ML MISC by Does not apply route. Use as directed       . lisinopril-hydrochlorothiazide (PRINZIDE,ZESTORETIC) 20-12.5 MG per tablet Take 2 tablets daily      . metoclopramide (REGLAN) 10 MG tablet Take 10 mg by mouth 3 (three) times daily as needed. 1 tab by mouth 10 mins prior to meals      . metoprolol tartrate (LOPRESSOR) 12.5 mg TABS Take 6.25 mg by mouth 2 (two) times daily.        . mirtazapine (REMERON) 30 MG tablet Take 1 tablet (30 mg total) by mouth at bedtime.  30 tablet  2  .  omeprazole (PRILOSEC) 20 MG capsule Take 1 capsule (20 mg total) by mouth daily.      . pravastatin (PRAVACHOL) 80 MG tablet Take 1 tablet (80 mg total) by mouth every evening.  30 tablet  6  . traZODone (DESYREL) 100 MG tablet Take 100 mg by mouth at bedtime.        [name] DISCONTD: omeprazole (PRILOSEC) 40 MG capsule Take 1 capsule (40 mg total) by mouth daily.  30 capsule  3   Current Facility-Administered Medications on File Prior to Visit  Medication Dose Route Frequency Provider Last Rate Last Dose  . 0.9 %  sodium chloride infusion  500 mL Intravenous Continuous Gatha Mayer, MD      . sodium chloride 0.9 % injection 3 mL  3 mL Intravenous Q12H [name], MD      . sodium chloride 0.9 % injection 3 mL  3 mL Intravenous PRN [name], MD        Allergies  Allergen Reactions  .  Ibuprofen     REACTION: chest tightness and feeling like something stuck in chest     Review of Systems  Constitutional: Negative for fever, chills, diaphoresis and fatigue.  Musculoskeletal: Positive for arthralgias. Negative for back pain, joint swelling and gait problem.  Neurological: Positive for numbness. Negative for weakness.       Objective:   Physical Exam  Constitutional: She is oriented to person, place, and time. She appears well-developed and well-nourished.       BP 130/70   Pulmonary/Chest: Effort normal.  Musculoskeletal:       Neck  with intact skin. No swelling, no hematomas. FROM  for flexion, extension, rotation and lateralization with pain at the extremes.  Tenderness to palpation in the posterior aspect of her neck. Strength 5/5 for shoulder flexion and extension, 5/5 for elbow flexion and extension, 5/5 for wrist flexion and extension B/L. DTR biceps,triceps and brachioradialis II/IV B/L. Sensation intact distally B/L. Positive Spurling test to the left side.  Right shoulder with intact skin. No swelling no hematomas. Decreased range of motion for flexion extension internal, external rotation abduction .[name] Hawkins test positive for tear cuff impingement . A.c. Joint with no tenderness to palpation with negative crossover test . Empty can test negative for rotator cuff tear. Speed test negative for biceps tendinoplasty No tenderness at the bicipital groove. Strength 5 over 5 with internal and external rotation . Sensation is intact .  Left shoulder with intact skin. No swelling no hematomas. Decreased range of motion for flexion extension internal, external rotation abduction . Hawkins test positive for tear cuff impingement . A.c. Joint with no tenderness to palpation with negative crossover test . Empty can test negative for rotator cuff tear. Speed test negative for biceps tendinoplasty No tenderness at the bicipital groove. Strength 5 over 5 with  internal and external rotation . Sensation is intact .     Neurological: She is alert and oriented to person, place, and time. She has normal reflexes.  Skin: Skin is warm. No rash noted. No erythema.  Psychiatric: She has a normal mood and affect. Her behavior is normal.          Assessment & Plan:   1. Left cervical radiculopathy  methylPREDNISolone (MEDROL, PAK,) 4 MG tablet, DG Cervical Spine Complete   She recently increased her neurontin night dose to 300 mg Jobes [facility] C spine x ray I will call her with the x ray result We may refer her  to PT for her c spine.

## 2011-07-25 ENCOUNTER — Ambulatory Visit (HOSPITAL_COMMUNITY)
Admission: RE | Admit: 2011-07-25 | Discharge: 2011-07-25 | Disposition: A | Discharge status: Home / Self Care | Payer: Medicaid Other | Source: Ambulatory Visit | Attending: Family Medicine | Admitting: Family Medicine

## 2011-07-25 DIAGNOSIS — M5412 Radiculopathy, cervical region: Secondary | ICD-10-CM | POA: Insufficient documentation

## 2011-08-07 ENCOUNTER — Ambulatory Visit (INDEPENDENT_AMBULATORY_CARE_PROVIDER_SITE_OTHER): Payer: Medicaid Other | Admitting: Family Medicine

## 2011-08-07 ENCOUNTER — Encounter: Payer: Self-pay | Admitting: Family Medicine

## 2011-08-07 VITALS — BP 131/82 | HR 103

## 2011-08-07 DIAGNOSIS — G5600 Carpal tunnel syndrome, unspecified upper limb: Secondary | ICD-10-CM

## 2011-08-07 DIAGNOSIS — G5601 Carpal tunnel syndrome, right upper limb: Secondary | ICD-10-CM

## 2011-08-07 DIAGNOSIS — G5602 Carpal tunnel syndrome, left upper limb: Secondary | ICD-10-CM

## 2011-08-07 NOTE — Progress Notes (Signed)
Subjective:    Patient ID: [name], female    DOB: 1955-12-02, 56 y.o.   MRN: [id number]  HPI  MercedesWilliams is coming to followup on her left lower extremity radiculopathy. We tried her on a Medrol Dosepak, which improved her symptoms while taking a Medrol Dosepak. We also did x-rays of her C-spine which show moderate irrigation of her C-spine good the intervertebral space looked very good, and the foramina are all open.  She is describing her numbness and tingling more severe on the left hand on the wrist and forearm area, his head off the whole left upper extremity. The symptoms are compatible with a carpal tunnel syndrome bilateral put more severe on the left side.  She is also having pain on her left shoulder, but we will address that complain in another visit.   Patient Active Problem List  Diagnoses  . DIABETES MELLITUS, TYPE II  . DIABETIC PERIPHERAL NEUROPATHY  . HYPERLIPIDEMIA  . BIPOLAR II DISORDER  . TOBACCO USE  . TENSION HEADACHE  . DEPRESSIVE DISORDER, NOS  . UNSPECIFIED SUBJECTIVE VISUAL DISTURBANCE  . GASTROESOPHAGEAL REFLUX, NO ESOPHAGITIS  . GASTROPARESIS  . VAGINAL PRURITUS  . DYSTROPHIC NAILS  . UNSPECIFIED ARTHROPATHY MULTIPLE SITES  . Adhesive capsulitis  . PAIN IN JOINT, HAND  . Trigger finger (acquired)  . DE QUERVAIN'S TENOSYNOVITIS  . FIBROMYALGIA  . INSOMNIA NOS  . HYPERTENSION, BENIGN ESSENTIAL  . Dental abscess  . Chest pain  . Exertional dyspnea  . Annual physical exam  . Abnormal colonoscopy  . Left cervical radiculopathy   Current Outpatient Prescriptions on File Prior to Visit  Medication Sig Dispense Refill  . amLODipine (NORVASC) 10 MG tablet Take 10 mg by mouth daily.       . ARIPiprazole (ABILIFY) 10 MG tablet Take 20 mg by mouth daily.       [name] aspirin 81 MG tablet Take 1 tablet (81 mg total) by mouth daily.  30 tablet  11  . gabapentin (NEURONTIN) 100 MG capsule Take 1 capsule (100 mg total) by mouth 3 (three) times daily.  90  capsule  2  . insulin aspart (NOVOLOG) 100 UNIT/ML injection Inject 3-5 Units into the skin 3 (three) times daily before meals. Take 3-5 units. 3 units with small meals, 5 units with bigger meals. Can also take novolog immediately after meals  1 vial  3  . insulin NPH (HUMULIN N,NOVOLIN N) 100 UNIT/ML injection Inject 13 Units into the skin 2 (two) times daily.       . Insulin Pen Needle (B-D ULTRAFINE III SHORT PEN) 31G X 8 MM MISC by Does not apply route. Use as directed       . Insulin Syringe-Needle U-100 (B-D INS SYR MICROFINE 1CC/27G) 27G X 5/8" 1 ML MISC by Does not apply route. Use as directed       . lisinopril-hydrochlorothiazide (PRINZIDE,ZESTORETIC) 20-12.5 MG per tablet Take 2 tablets daily      . metoclopramide (REGLAN) 10 MG tablet Take 10 mg by mouth 3 (three) times daily as needed. 1 tab by mouth 10 mins prior to meals      . metoprolol tartrate (LOPRESSOR) 12.5 mg TABS Take 6.25 mg by mouth 2 (two) times daily.        . mirtazapine (REMERON) 30 MG tablet Take 1 tablet (30 mg total) by mouth at bedtime.  30 tablet  2  . omeprazole (PRILOSEC) 20 MG capsule Take 1 capsule (20 mg total) by mouth daily.      [name]  pravastatin (PRAVACHOL) 80 MG tablet Take 1 tablet (80 mg total) by mouth every evening.  30 tablet  6  . traZODone (DESYREL) 100 MG tablet Take 100 mg by mouth at bedtime.        [name] DISCONTD: omeprazole (PRILOSEC) 40 MG capsule Take 1 capsule (40 mg total) by mouth daily.  30 capsule  3   Current Facility-Administered Medications on File Prior to Visit  Medication Dose Route Frequency Provider Last Rate Last Dose  . 0.9 %  sodium chloride infusion  500 mL Intravenous Continuous Gatha Mayer, MD      . sodium chloride 0.9 % injection 3 mL  3 mL Intravenous Q12H [name], MD      . sodium chloride 0.9 % injection 3 mL  3 mL Intravenous PRN [name], MD       Allergies  Allergen Reactions  . Ibuprofen     REACTION: chest tightness and feeling like something stuck in  chest     Review of Systems  Constitutional: Negative for fever, chills, diaphoresis and fatigue.  Musculoskeletal: Negative for back pain, joint swelling, arthralgias and gait problem.  Neurological: Positive for numbness. Negative for weakness.       Tingling in both hands worse in the left hand.       Objective:   Physical Exam  Constitutional: She is oriented to person, place, and time. She appears well-developed and well-nourished.       BP 131/82  Pulse 103   Pulmonary/Chest: Effort normal.  Musculoskeletal:       B/L wrist with positive Tinel's sign more severe on the left wrist. Bilateral positive compression test on the carpal tunnel. Bilateral positive Phalen's test more prominent on the left side. Decrease sensation on the tip of her middle and index finger.  Neurological: She is alert and oriented to person, place, and time.  Skin: Skin is warm. No rash noted. No erythema.  Psychiatric: She has a normal mood and affect. Her behavior is normal. Thought content normal.   MSK U/S of Right wrist : Showed a compress median nerve in the long axis.  After obtaining consent, the skin of the volar right wrist was sterilely prepped with alcohol swabs, ethyl will chloride was used for local topical anesthesia injection of 1 mL 1% lidocaine plus one mL of Kenalog was injected in the right carpal tunnel under MSK U/S guidance. The procedure was well-tolerated by the patient. Patient instructed to remain in clinic for 20 minutes afterwards, and to report any adverse reaction to me immediately.       Assessment & Plan:   1. Carpal tunnel syndrome of left wrist  NCV with EMG(electromyography)  2. Carpal tunnel syndrome on right  NCV with EMG(electromyography)   Referred for EMG with Dr. Ron Agee on 08/12/11. If EMG confirms a severe carpal tunnel syndrome we will refer her to Dr. Noemi Chapel for carpal tunnel release surgery.

## 2011-08-07 NOTE — Patient Instructions (Signed)
You have an appointment scheduled with Dr Shelda Altes on Tuesday, August 12, 2011 at 10:45am. Arrive at 10:30am for early registration at [facility]. [employer]. Phone number is [phone] is you need to reschedule or cancel the appointment time or date. If the study shows positive for carpel tunnel, their office will schedule you an appt to see the surgeon there for a consult.

## 2011-08-11 ENCOUNTER — Ambulatory Visit: Payer: Medicaid Other | Admitting: Family Medicine

## 2011-08-13 ENCOUNTER — Encounter (INDEPENDENT_AMBULATORY_CARE_PROVIDER_SITE_OTHER): Payer: Medicaid Other | Admitting: Ophthalmology

## 2011-08-13 DIAGNOSIS — H2510 Age-related nuclear cataract, unspecified eye: Secondary | ICD-10-CM

## 2011-08-13 DIAGNOSIS — H43819 Vitreous degeneration, unspecified eye: Secondary | ICD-10-CM

## 2011-08-13 DIAGNOSIS — I10 Essential (primary) hypertension: Secondary | ICD-10-CM

## 2011-08-13 DIAGNOSIS — E11311 Type 2 diabetes mellitus with unspecified diabetic retinopathy with macular edema: Secondary | ICD-10-CM

## 2011-08-13 DIAGNOSIS — E1165 Type 2 diabetes mellitus with hyperglycemia: Secondary | ICD-10-CM

## 2011-08-13 DIAGNOSIS — H35039 Hypertensive retinopathy, unspecified eye: Secondary | ICD-10-CM

## 2011-08-13 DIAGNOSIS — E1139 Type 2 diabetes mellitus with other diabetic ophthalmic complication: Secondary | ICD-10-CM

## 2011-08-13 DIAGNOSIS — E11319 Type 2 diabetes mellitus with unspecified diabetic retinopathy without macular edema: Secondary | ICD-10-CM

## 2011-08-21 ENCOUNTER — Encounter: Payer: Self-pay | Admitting: Family Medicine

## 2011-08-21 ENCOUNTER — Ambulatory Visit (INDEPENDENT_AMBULATORY_CARE_PROVIDER_SITE_OTHER): Payer: Medicaid Other | Admitting: Family Medicine

## 2011-08-21 VITALS — BP 153/86 | HR 109 | Ht 62.0 in | Wt 135.0 lb

## 2011-08-21 DIAGNOSIS — E119 Type 2 diabetes mellitus without complications: Secondary | ICD-10-CM

## 2011-08-21 DIAGNOSIS — I10 Essential (primary) hypertension: Secondary | ICD-10-CM

## 2011-08-21 NOTE — Patient Instructions (Addendum)
It was good to see you today  Increase you NPH to 14 units twice a day Increase your sliding scale to 6 units with meals.  Be sure to drink glucerna in between meals Come back to see me in  3 weeks Call with any questions,  God Bless, Shanda Howells MD

## 2011-08-22 ENCOUNTER — Other Ambulatory Visit (INDEPENDENT_AMBULATORY_CARE_PROVIDER_SITE_OTHER): Payer: Medicaid Other | Admitting: Ophthalmology

## 2011-08-22 DIAGNOSIS — H3581 Retinal edema: Secondary | ICD-10-CM

## 2011-08-22 LAB — BASIC METABOLIC PANEL
CO2: 24 mEq/L (ref 19–32)
Calcium: 10.6 mg/dL — ABNORMAL HIGH (ref 8.4–10.5)
Chloride: 98 mEq/L (ref 96–112)
Glucose, Bld: 323 mg/dL — ABNORMAL HIGH (ref 70–99)
Sodium: 136 mEq/L (ref 135–145)

## 2011-08-22 NOTE — Progress Notes (Signed)
S:  Pt is here for chronic problem follow up:  HTN:  Checking BPS Daily ?:yes BP Range:120s-150s Any HA, CP, SOB?:intermitttent CP which has been cleared from recent cardac catheterization with was negative for any coronary atherosclerosis. Working dx of microvascular ischemia. Pt does have intermittent dizziness with standing. Working dx for this is diabetic autonomic neuropathy.  Any Medication Side Effects:no  Medication Compliance: yes  Salt intake?:low per pt Exercise?:no Weight Loss?:stable    DM: Checking CBGs?:yes  How Often?:tid Blood Sugar Range:300s-400s Polyuria, polydypsia, polyphagia:no Symptomatic Hypoglycemia:significant history of this with blood sugars below 160s-170s.  Medication Compliance?:yes ACE if hypertensive/obesity?:yes Statin if LDL >100?:yes    O:  Current Outpatient Prescriptions  Medication Sig Dispense Refill  . amLODipine (NORVASC) 10 MG tablet Take 10 mg by mouth daily.       . ARIPiprazole (ABILIFY) 10 MG tablet Take 20 mg by mouth daily.       [name] aspirin 81 MG tablet Take 1 tablet (81 mg total) by mouth daily.  30 tablet  11  . gabapentin (NEURONTIN) 100 MG capsule Take 1 capsule (100 mg total) by mouth 3 (three) times daily.  90 capsule  2  . insulin aspart (NOVOLOG) 100 UNIT/ML injection Inject 3-5 Units into the skin 3 (three) times daily before meals. Take 3-5 units. 3 units with small meals, 5 units with bigger meals. Can also take novolog immediately after meals  1 vial  3  . insulin NPH (HUMULIN N,NOVOLIN N) 100 UNIT/ML injection Inject 13 Units into the skin 2 (two) times daily.       . Insulin Pen Needle (B-D ULTRAFINE III SHORT PEN) 31G X 8 MM MISC by Does not apply route. Use as directed       . Insulin Syringe-Needle U-100 (B-D INS SYR MICROFINE 1CC/27G) 27G X 5/8" 1 ML MISC by Does not apply route. Use as directed       . lisinopril-hydrochlorothiazide (PRINZIDE,ZESTORETIC) 20-12.5 MG per tablet Take 2 tablets daily      .  metoclopramide (REGLAN) 10 MG tablet Take 10 mg by mouth 3 (three) times daily as needed. 1 tab by mouth 10 mins prior to meals      . metoprolol tartrate (LOPRESSOR) 12.5 mg TABS Take 6.25 mg by mouth 2 (two) times daily.        . mirtazapine (REMERON) 30 MG tablet Take 1 tablet (30 mg total) by mouth at bedtime.  30 tablet  2  . omeprazole (PRILOSEC) 20 MG capsule Take 1 capsule (20 mg total) by mouth daily.      . pravastatin (PRAVACHOL) 80 MG tablet Take 1 tablet (80 mg total) by mouth every evening.  30 tablet  6  . traZODone (DESYREL) 100 MG tablet Take 100 mg by mouth at bedtime.        [name] DISCONTD: omeprazole (PRILOSEC) 40 MG capsule Take 1 capsule (40 mg total) by mouth daily.  30 capsule  3   Current Facility-Administered Medications  Medication Dose Route Frequency Provider Last Rate Last Dose  . 0.9 %  sodium chloride infusion  500 mL Intravenous Continuous Gatha Mayer, MD      . sodium chloride 0.9 % injection 3 mL  3 mL Intravenous Q12H [name], MD      . sodium chloride 0.9 % injection 3 mL  3 mL Intravenous PRN [name], MD        Wt Readings from Last 3 Encounters:  08/21/11 135 lb (61.236  kg)  07/18/11 140 lb (63.504 kg)  07/10/11 139 lb (63.05 kg)   Temp Readings from Last 3 Encounters:  07/10/11 98.2 F (36.8 C)   05/26/11 97.3 F (36.3 C) Oral  05/26/11 97.3 F (36.3 C) Oral   BP Readings from Last 3 Encounters:  08/21/11 153/86  08/07/11 131/82  07/24/11 130/70   Pulse Readings from Last 3 Encounters:  08/21/11 109  08/07/11 103  07/18/11 85   General: alert and cooperative  Heart: S1, S2 normal, no murmur, rub or gallop, regular rate and rhythm  Lungs: clear to auscultation, no wheezes or rales and unlabored breathing  Abdomen: abdomen is soft without significant tenderness, masses, organomegaly or guarding  Extremities: extremities normal, atraumatic, no cyanosis or edema  Skin:no rashes  Neurology: normal without focal findings, mental  status, speech normal, alert and oriented x3, PERLA and reflexes normal and symmetric      A/P:

## 2011-08-23 MED ORDER — INSULIN NPH (HUMAN) (ISOPHANE) 100 UNIT/ML ~~LOC~~ SUSP: 14.0000 [IU] | Freq: Two times a day (BID) | SUBCUTANEOUS | Status: DC

## 2011-08-23 MED ORDER — INSULIN ASPART 100 UNIT/ML ~~LOC~~ SOLN: 6.0000 [IU] | Freq: Three times a day (TID) | SUBCUTANEOUS | Status: DC

## 2011-08-23 NOTE — Assessment & Plan Note (Addendum)
A1C markedly elevated. However pt with brittle DM and siginificantly symptomatic with CBGs <150. Will increase NPH to 14 units BID (from 13 units BID) , and sliding scale to 6 units with meals (from 5 units with meals). Will follow up in 1-2 weeks.

## 2011-08-23 NOTE — Assessment & Plan Note (Signed)
Otherwise stable. At symptomatic baseline in setting of diabetic autonomic neuropathy. Continue with current regimen. Check Cr and K.

## 2011-09-10 ENCOUNTER — Encounter (INDEPENDENT_AMBULATORY_CARE_PROVIDER_SITE_OTHER): Payer: Medicaid Other | Admitting: Ophthalmology

## 2011-09-10 DIAGNOSIS — E11359 Type 2 diabetes mellitus with proliferative diabetic retinopathy without macular edema: Secondary | ICD-10-CM

## 2011-09-10 DIAGNOSIS — E119 Type 2 diabetes mellitus without complications: Secondary | ICD-10-CM

## 2011-09-15 ENCOUNTER — Encounter: Payer: Self-pay | Admitting: Family Medicine

## 2011-09-15 ENCOUNTER — Ambulatory Visit (INDEPENDENT_AMBULATORY_CARE_PROVIDER_SITE_OTHER): Payer: Medicaid Other | Admitting: Family Medicine

## 2011-09-15 VITALS — BP 152/83 | HR 103 | Temp 98.0°F | Ht 62.0 in | Wt 138.0 lb

## 2011-09-15 DIAGNOSIS — I10 Essential (primary) hypertension: Secondary | ICD-10-CM

## 2011-09-15 DIAGNOSIS — E119 Type 2 diabetes mellitus without complications: Secondary | ICD-10-CM

## 2011-09-15 DIAGNOSIS — R0600 Dyspnea, unspecified: Secondary | ICD-10-CM

## 2011-09-15 DIAGNOSIS — R0609 Other forms of dyspnea: Secondary | ICD-10-CM

## 2011-09-15 MED ORDER — INSULIN NPH (HUMAN) (ISOPHANE) 100 UNIT/ML ~~LOC~~ SUSP: 16.0000 [IU] | Freq: Two times a day (BID) | SUBCUTANEOUS | Status: DC

## 2011-09-15 NOTE — Progress Notes (Signed)
S:  Pt is here for chronic problem follow up:  DM: Checking CBGs?:yes  How Often?:2-3 times per day  Blood Sugar Range:200s-300 Polyuria, polydypsia, polyphagia:no Symptomatic Hypoglycemia:no Medication Compliance?:yes  ACE if hypertensive/obesity?:yes Statin if LDL >100?:yes   HTN:  Checking BPS Daily ?:yes  BP Range:150s  Any HA, CP, SOB?:yes; typical symptom profile, unchanged  Any Medication Side Effects:no Medication Compliance:yes Salt intake?:stable Exercise?:walking when tolerable  O:  Current Outpatient Prescriptions  Medication Sig Dispense Refill  . amLODipine (NORVASC) 10 MG tablet Take 10 mg by mouth daily.       . ARIPiprazole (ABILIFY) 10 MG tablet Take 20 mg by mouth daily.       . ARIPiprazole (ABILIFY) 2 MG tablet Take 1 tablet (2 mg total) by mouth daily.      [name] aspirin 81 MG tablet Take 1 tablet (81 mg total) by mouth daily.  30 tablet  11  . gabapentin (NEURONTIN) 100 MG capsule Take 1 capsule (100 mg total) by mouth 3 (three) times daily.  90 capsule  2  . insulin aspart (NOVOLOG) 100 UNIT/ML injection Inject 6 Units into the skin 3 (three) times daily before meals.  1 vial  3  . insulin NPH (HUMULIN N,NOVOLIN N) 100 UNIT/ML injection Inject 14 Units into the skin 2 (two) times daily.  1 vial  3  . Insulin Pen Needle (B-D ULTRAFINE III SHORT PEN) 31G X 8 MM MISC by Does not apply route. Use as directed       . Insulin Syringe-Needle U-100 (B-D INS SYR MICROFINE 1CC/27G) 27G X 5/8" 1 ML MISC by Does not apply route. Use as directed       . lisinopril-hydrochlorothiazide (PRINZIDE,ZESTORETIC) 20-12.5 MG per tablet Take 2 tablets daily      . metoclopramide (REGLAN) 10 MG tablet Take 10 mg by mouth 3 (three) times daily as needed. 1 tab by mouth 10 mins prior to meals      . metoprolol tartrate (LOPRESSOR) 12.5 mg TABS Take 6.25 mg by mouth 2 (two) times daily.        . mirtazapine (REMERON) 30 MG tablet Take 1 tablet (30 mg total) by mouth at bedtime.  30  tablet  2  . omeprazole (PRILOSEC) 20 MG capsule Take 1 capsule (20 mg total) by mouth daily.      . pravastatin (PRAVACHOL) 80 MG tablet Take 1 tablet (80 mg total) by mouth every evening.  30 tablet  6  . traZODone (DESYREL) 100 MG tablet Take 100 mg by mouth at bedtime.        [name] DISCONTD: amLODipine (NORVASC) 10 MG tablet Take 0.5 tablets (5 mg total) by mouth daily.  30 tablet  11  . DISCONTD: insulin NPH (NOVOLIN N) 100 UNIT/ML injection 10 units twice daily  10 mL  3  . DISCONTD: omeprazole (PRILOSEC) 40 MG capsule Take 1 capsule (40 mg total) by mouth daily.  30 capsule  3   Current Facility-Administered Medications  Medication Dose Route Frequency Provider Last Rate Last Dose  . 0.9 %  sodium chloride infusion  500 mL Intravenous Continuous Gatha Mayer, MD      . sodium chloride 0.9 % injection 3 mL  3 mL Intravenous Q12H Larey Dresser, MD      . sodium chloride 0.9 % injection 3 mL  3 mL Intravenous PRN Larey Dresser, MD        Wt Readings from Last 3 Encounters:  09/15/11 138 lb (  62.596 kg)  08/21/11 135 lb (61.236 kg)  07/18/11 140 lb (63.504 kg)   Temp Readings from Last 3 Encounters:  09/15/11 98 F (36.7 C) Oral  07/10/11 98.2 F (36.8 C)   05/26/11 97.3 F (36.3 C) Oral   BP Readings from Last 3 Encounters:  09/15/11 152/83  08/21/11 153/86  08/07/11 131/82   Pulse Readings from Last 3 Encounters:  09/15/11 103  08/21/11 109  08/07/11 103    General: alert and cooperative  Heart: S1, S2 normal, no murmur, rub or gallop, regular rate and rhythm  Lungs: clear to auscultation, no wheezes or rales and unlabored breathing  Abdomen: abdomen is soft without significant tenderness, masses, organomegaly or guarding  Extremities: extremities normal, atraumatic, no cyanosis or edema  Skin:no rashes  Neurology: normal without focal findings, mental status, speech normal, alert and oriented x3, PERLA and reflexes normal and symmetric     A/P:

## 2011-09-15 NOTE — Patient Instructions (Addendum)
It was good to see today I am increasing your NPH to 16 units twice a day If your blood sugars get too low at this dose you can cut back to 15 or 14 units twice a day Will continue same blood pressure regimen We'll check some labs today Come back to see me one to 2 weeks to fully discuss your sexual issues I am setting of this month tests. Quit smoking. Call if any questions God Bless, Shanda Howells MD

## 2011-09-16 LAB — BASIC METABOLIC PANEL
CO2: 26 mEq/L (ref 19–32)
Calcium: 10.3 mg/dL (ref 8.4–10.5)
Chloride: 103 mEq/L (ref 96–112)
Glucose, Bld: 274 mg/dL — ABNORMAL HIGH (ref 70–99)
Sodium: 137 mEq/L (ref 135–145)

## 2011-09-28 NOTE — Assessment & Plan Note (Signed)
Will increase NPH to 16 units BID. Continue with current sliding scale regimen. Hypoglycemic red flags reviewed as pt has significant brittle diabetes.  Follow up in 2-4 weeks.

## 2011-09-28 NOTE — Assessment & Plan Note (Signed)
Stable on current regimen.  Will check Cr and K. Follow up in 2-4 weeks.

## 2011-09-30 ENCOUNTER — Telehealth: Payer: Self-pay | Admitting: Family Medicine

## 2011-09-30 ENCOUNTER — Ambulatory Visit: Payer: Self-pay | Admitting: Family Medicine

## 2011-09-30 NOTE — Telephone Encounter (Signed)
Note placed in error

## 2011-10-10 ENCOUNTER — Ambulatory Visit (INDEPENDENT_AMBULATORY_CARE_PROVIDER_SITE_OTHER): Payer: Medicaid Other | Admitting: Family Medicine

## 2011-10-10 ENCOUNTER — Encounter: Payer: Self-pay | Admitting: Family Medicine

## 2011-10-10 VITALS — BP 165/89 | HR 75 | Ht 62.0 in | Wt 140.0 lb

## 2011-10-10 DIAGNOSIS — E119 Type 2 diabetes mellitus without complications: Secondary | ICD-10-CM

## 2011-10-10 DIAGNOSIS — I10 Essential (primary) hypertension: Secondary | ICD-10-CM

## 2011-10-10 MED ORDER — INSULIN NPH (HUMAN) (ISOPHANE) 100 UNIT/ML ~~LOC~~ SUSP: 17.0000 [IU] | Freq: Two times a day (BID) | SUBCUTANEOUS | Status: DC

## 2011-10-10 NOTE — Progress Notes (Signed)
Subjective:  Pt is here for chronic problem follow up  DM: Checking CBGs?:no; glucose meter broken for the last week, is trying to get fixed How Often?:n/a Blood Sugar Range:prior to this week, 150s-200s Polyuria, polydypsia, polyphagia:no Symptomatic Hypoglycemia:no Medication Compliance?:yes ACE if hypertensive/obesity?:yes Statin if LDL >100?:yes  Lab Results  Component Value Date   HGBA1C 11.8 08/21/2011     HTN:  Checking BPS Daily ?:yes BP Range:140s-160s  Any HA, CP, SOB?:no; stable non cardiac chest pain that has been unchanged  Any Medication Side Effects:no Medication Compliance:yes Salt intake?:low per pt Exercise?:no Weight Loss?:no  Lab Results  Component Value Date   LDLCALC 198* 04/08/2011   HTN:  Checking BPS Daily ?:yes BP Range:140s-160s Any HA, CP, SOB?:stable non cardiac CP Any Medication Side Effects:no Medication Compliance:yes Salt intake?:no Exercise?:no Weight Loss?:stable   Lab Results  Component Value Date   LDLCALC 198* 04/08/2011            Review of Systems - Negative except as noted above in HPI, otherwise 12 point ROS negative.     Objective:  Current Outpatient Prescriptions  Medication Sig Dispense Refill  . amLODipine (NORVASC) 10 MG tablet Take 10 mg by mouth daily.       . ARIPiprazole (ABILIFY) 10 MG tablet Take 20 mg by mouth daily.       . ARIPiprazole (ABILIFY) 2 MG tablet Take 1 tablet (2 mg total) by mouth daily.      [name] aspirin 81 MG tablet Take 1 tablet (81 mg total) by mouth daily.  30 tablet  11  . gabapentin (NEURONTIN) 100 MG capsule Take 1 capsule (100 mg total) by mouth 3 (three) times daily.  90 capsule  2  . insulin aspart (NOVOLOG) 100 UNIT/ML injection Inject 6 Units into the skin 3 (three) times daily before meals.  1 vial  3  . insulin NPH (HUMULIN N,NOVOLIN N) 100 UNIT/ML injection Inject 16 Units into the skin 2 (two) times daily.  1 vial  3  . Insulin Pen Needle (B-D ULTRAFINE III SHORT PEN)  31G X 8 MM MISC by Does not apply route. Use as directed       . Insulin Syringe-Needle U-100 (B-D INS SYR MICROFINE 1CC/27G) 27G X 5/8" 1 ML MISC by Does not apply route. Use as directed       . lisinopril-hydrochlorothiazide (PRINZIDE,ZESTORETIC) 20-12.5 MG per tablet Take 2 tablets daily      . metoclopramide (REGLAN) 10 MG tablet Take 10 mg by mouth 3 (three) times daily as needed. 1 tab by mouth 10 mins prior to meals      . metoprolol tartrate (LOPRESSOR) 12.5 mg TABS Take 6.25 mg by mouth 2 (two) times daily.        . mirtazapine (REMERON) 30 MG tablet Take 1 tablet (30 mg total) by mouth at bedtime.  30 tablet  2  . omeprazole (PRILOSEC) 20 MG capsule Take 1 capsule (20 mg total) by mouth daily.      . pravastatin (PRAVACHOL) 80 MG tablet Take 1 tablet (80 mg total) by mouth every evening.  30 tablet  6  . traZODone (DESYREL) 100 MG tablet Take 100 mg by mouth at bedtime.        [name] DISCONTD: amLODipine (NORVASC) 10 MG tablet Take 0.5 tablets (5 mg total) by mouth daily.  30 tablet  11  . DISCONTD: insulin NPH (NOVOLIN N) 100 UNIT/ML injection 10 units twice daily  10 mL  3  .  DISCONTD: omeprazole (PRILOSEC) 40 MG capsule Take 1 capsule (40 mg total) by mouth daily.  30 capsule  3   Current Facility-Administered Medications  Medication Dose Route Frequency Provider Last Rate Last Dose  . 0.9 %  sodium chloride infusion  500 mL Intravenous Continuous Gatha Mayer, MD      . sodium chloride 0.9 % injection 3 mL  3 mL Intravenous Q12H Larey Dresser, MD      . sodium chloride 0.9 % injection 3 mL  3 mL Intravenous PRN Larey Dresser, MD        Wt Readings from Last 3 Encounters:  10/10/11 140 lb (63.504 kg)  09/15/11 138 lb (62.596 kg)  08/21/11 135 lb (61.236 kg)   Temp Readings from Last 3 Encounters:  09/15/11 98 F (36.7 C) Oral  07/10/11 98.2 F (36.8 C)   05/26/11 97.3 F (36.3 C) Oral   BP Readings from Last 3 Encounters:  10/10/11 165/89  09/15/11 152/83  08/21/11  153/86   Pulse Readings from Last 3 Encounters:  10/10/11 75  09/15/11 103  08/21/11 109    General: alert and cooperative HEENT: PERRLA and extra ocular movement intact Heart: S1, S2 normal, no murmur, rub or gallop, regular rate and rhythm Lungs: clear to auscultation, no wheezes or rales and unlabored breathing Abdomen: abdomen is soft without significant tenderness, masses, organomegaly or guarding Extremities: extremities normal, atraumatic, no cyanosis or edema Skin:no rashes Neurology: normal without focal findings   Assessment/Plan:

## 2011-10-10 NOTE — Patient Instructions (Signed)
It was good and see today I have increase your NPH to 17 units twice a day Followup with me in 2-3 weeks Be sure to get your blood glucose meter fixed, if not call immediately Followup with me in 3-4 weeks  Hypertension As your heart beats, it forces blood through your arteries. This force is your blood pressure. If the pressure is too high, it is called hypertension (HTN) or high blood pressure. HTN is dangerous because you may have it and not know it. High blood pressure may mean that your heart has to work harder to pump blood. Your arteries may be narrow or stiff. The extra work puts you at risk for heart disease, stroke, and other problems.  Blood pressure consists of two numbers, a higher number over a lower, 110/72, for example. It is stated as "110 over 72." The ideal is below 120 for the top number (systolic) and under 80 for the bottom (diastolic). Write down your blood pressure today. You should pay close attention to your blood pressure if you have certain conditions such as:  Heart failure.   Prior heart attack.   Diabetes   Chronic kidney disease.   Prior stroke.   Multiple risk factors for heart disease.  To see if you have HTN, your blood pressure should be measured while you are seated with your arm held at the level of the heart. It should be measured at least twice. A one-time elevated blood pressure reading (especially in the Emergency Department) does not mean that you need treatment. There may be conditions in which the blood pressure is different between your right and left arms. It is important to see your caregiver soon for a recheck. Most people have essential hypertension which means that there is not a specific cause. This type of high blood pressure may be lowered by changing lifestyle factors such as:  Stress.   Smoking.   Lack of exercise.   Excessive weight.   Drug/tobacco/alcohol use.   Eating less salt.  Most people do not have symptoms from high  blood pressure until it has caused damage to the body. Effective treatment can often prevent, delay or reduce that damage. TREATMENT  When a cause has been identified, treatment for high blood pressure is directed at the cause. There are a large number of medications to treat HTN. These fall into several categories, and your caregiver will help you select the medicines that are best for you. Medications may have side effects. You should review side effects with your caregiver. If your blood pressure stays high after you have made lifestyle changes or started on medicines,   Your medication(s) may need to be changed.   Other problems may need to be addressed.   Be certain you understand your prescriptions, and know how and when to take your medicine.   Be sure to follow up with your caregiver within the time frame advised (usually within two weeks) to have your blood pressure rechecked and to review your medications.   If you are taking more than one medicine to lower your blood pressure, make sure you know how and at what times they should be taken. Taking two medicines at the same time can result in blood pressure that is too low.  SEEK IMMEDIATE MEDICAL CARE IF:  You develop a severe headache, blurred or changing vision, or confusion.   You have unusual weakness or numbness, or a faint feeling.   You have severe chest or abdominal pain, vomiting,  or breathing problems.  MAKE SURE YOU:   Understand these instructions.   Will watch your condition.   Will get help right away if you are not doing well or get worse.  Document Released: 06/02/2005 Document Revised: 05/22/2011 Document Reviewed: 01/21/2008 [facility] Patient Information 2012 [employer].

## 2011-10-11 LAB — MICROALBUMIN / CREATININE URINE RATIO
Creatinine, Urine: 24.4 mg/dL
Microalb Creat Ratio: 35.7 mg/g — ABNORMAL HIGH (ref 0.0–30.0)

## 2011-10-12 NOTE — Assessment & Plan Note (Addendum)
Stable on current regimen. Stressed compliance with medications. Will tolerate moderately higher pressures as pt is severely symptomatic (dizziness, weakness) when pressures persistently <130/80.

## 2011-10-12 NOTE — Assessment & Plan Note (Signed)
Will increase to NPH 17 units BID.

## 2011-10-24 ENCOUNTER — Encounter: Payer: Self-pay | Admitting: Family Medicine

## 2011-10-24 ENCOUNTER — Ambulatory Visit (INDEPENDENT_AMBULATORY_CARE_PROVIDER_SITE_OTHER): Payer: Self-pay | Admitting: Family Medicine

## 2011-10-24 VITALS — BP 141/84 | HR 108 | Temp 98.4°F | Ht 62.0 in | Wt 140.0 lb

## 2011-10-24 DIAGNOSIS — E119 Type 2 diabetes mellitus without complications: Secondary | ICD-10-CM

## 2011-10-24 DIAGNOSIS — M129 Arthropathy, unspecified: Secondary | ICD-10-CM

## 2011-10-24 NOTE — Patient Instructions (Signed)
Shoulder Pain The shoulder is a ball and socket joint. The muscles and tendons (rotator cuff) are what keep the shoulder in its joint and stable. This collection of muscles and tendons holds in the head (ball) of the humerus (upper arm bone) in the fossa (cup) of the scapula (shoulder blade). Today no reason was found for your shoulder pain. Often pain in the shoulder may be treated conservatively with temporary immobilization. For example, holding the shoulder in one place using a sling for rest. Physical therapy may be needed if problems continue. HOME CARE INSTRUCTIONS   Apply ice to the sore area for 15 to 20 minutes, 3 to 4 times per day for the first 2 days. Put the ice in a plastic bag. Place a towel between the bag of ice and your skin.   If you have or were given a shoulder sling and straps, do not remove for as long as directed by your caregiver or until you see a caregiver for a follow-up examination. If you need to remove it to shower or bathe, move your arm as little as possible.   Sleep on several pillows at night to lessen swelling and pain.   Only take over-the-counter or prescription medicines for pain, discomfort, or fever as directed by your caregiver.   Keep any follow-up appointments in order to avoid any type of permanent shoulder disability or chronic pain problems.  SEEK MEDICAL CARE IF:   Pain in your shoulder increases or new pain develops in your arm, hand, or fingers.   Your hand or fingers are colder than your other hand.   You do not obtain pain relief with the medications or your pain becomes worse.  SEEK IMMEDIATE MEDICAL CARE IF:   Your arm, hand, or fingers are numb or tingling.   Your arm, hand, or fingers are swollen, painful, or turn white or blue.   You develop chest pain or shortness of breath.  MAKE SURE YOU:   Understand these instructions.   Will watch your condition.   Will get help right away if you are not doing well or get worse.    Document Released: 03/12/2005 Document Revised: 05/22/2011 Document Reviewed: 05/17/2011 [employer] Patient Information 2012 [employer]. 

## 2011-10-25 NOTE — Progress Notes (Signed)
  Subjective:    Patient ID: [name], female    DOB: [birth year], 56 y.o.   MRN: [id number]  HPI  R shoulder pain:  This has been a recurrent issue.  R shoulder pain has acutely flared over the last 3-4 weeks. No trauma or strenuous activity.  Previous hx/o frozen shoulder (bilateral).  Has been managed by [facility] in the past.  Has appointment with [facility]  in 4 weeks.  Leaving for trip next week.  Would like steroid shot before trip so pain will not be as significant.  Last steroid shot in shoulder 1-2 years ago per pt. Helped with pain significantly in the past.    Review of Systems See HPI, otherwise ROS negative.     Objective:   Physical Exam Gen: in bed, NAD Shoulder: Inspection reveals no abnormalities, atrophy or asymmetry. Palpation is normal with no tenderness over AC joint or bicipital groove. ROM is full in all planes. +Hawkins and empty can Speeds and Yergason's tests normal. Good stability. + pain with resisted external rotation of shoulder.  Normal scapular function observed. No painful arc and no drop arm sign. No apprehension sign        Assessment & Plan:

## 2011-10-27 NOTE — Assessment & Plan Note (Signed)
CBGs in the 190s-250s at home. Increase NPH to 18 units BID.

## 2011-10-27 NOTE — Assessment & Plan Note (Signed)
R shoulder injection.  After informed written consent, patient was seated on exam table. Time out performed. Right shoulder was prepped with betadine and alcohol swab Utilizing the posterior approach, patient's right glenohumeral space was injected with 4:1 kenalog: lidocaine. Patient tolerated the procedure well without immediate complications. Follow up with [facility] in 3-4 weeks as previously scheduled.

## 2011-11-04 ENCOUNTER — Ambulatory Visit: Payer: Self-pay | Admitting: Family Medicine

## 2011-11-06 ENCOUNTER — Ambulatory Visit: Payer: Self-pay | Admitting: Family Medicine

## 2011-11-18 ENCOUNTER — Ambulatory Visit (INDEPENDENT_AMBULATORY_CARE_PROVIDER_SITE_OTHER): Payer: Self-pay | Admitting: Family Medicine

## 2011-11-18 ENCOUNTER — Encounter: Payer: Self-pay | Admitting: Family Medicine

## 2011-11-18 VITALS — BP 151/81 | HR 86 | Ht 62.0 in | Wt 142.5 lb

## 2011-11-18 DIAGNOSIS — E119 Type 2 diabetes mellitus without complications: Secondary | ICD-10-CM

## 2011-11-18 DIAGNOSIS — E1142 Type 2 diabetes mellitus with diabetic polyneuropathy: Secondary | ICD-10-CM

## 2011-11-18 DIAGNOSIS — R0609 Other forms of dyspnea: Secondary | ICD-10-CM

## 2011-11-18 DIAGNOSIS — IMO0001 Reserved for inherently not codable concepts without codable children: Secondary | ICD-10-CM

## 2011-11-18 DIAGNOSIS — K3184 Gastroparesis: Secondary | ICD-10-CM

## 2011-11-18 DIAGNOSIS — I10 Essential (primary) hypertension: Secondary | ICD-10-CM

## 2011-11-18 DIAGNOSIS — E1149 Type 2 diabetes mellitus with other diabetic neurological complication: Secondary | ICD-10-CM

## 2011-11-18 DIAGNOSIS — E785 Hyperlipidemia, unspecified: Secondary | ICD-10-CM

## 2011-11-18 DIAGNOSIS — F3189 Other bipolar disorder: Secondary | ICD-10-CM

## 2011-11-18 DIAGNOSIS — R0989 Other specified symptoms and signs involving the circulatory and respiratory systems: Secondary | ICD-10-CM

## 2011-11-18 DIAGNOSIS — R0600 Dyspnea, unspecified: Secondary | ICD-10-CM

## 2011-11-18 LAB — POCT GLYCOSYLATED HEMOGLOBIN (HGB A1C): Hemoglobin A1C: 11

## 2011-11-18 NOTE — Patient Instructions (Addendum)
It was good to see today Increase your NovoLog to 7 units with the biggest meal Otherwise continue the rest of her medicines On draw some lab work Followup the new doctor in the next 2-4 weeks Call if any questions

## 2011-11-19 LAB — BASIC METABOLIC PANEL
BUN: 18 mg/dL (ref 6–23)
CO2: 27 mEq/L (ref 19–32)
Calcium: 10 mg/dL (ref 8.4–10.5)
Chloride: 106 mEq/L (ref 96–112)
Creat: 0.89 mg/dL (ref 0.50–1.10)
Glucose, Bld: 75 mg/dL (ref 70–99)
Potassium: 4.4 mEq/L (ref 3.5–5.3)
Sodium: 142 mEq/L (ref 135–145)

## 2011-11-25 ENCOUNTER — Other Ambulatory Visit: Payer: Self-pay | Admitting: Family Medicine

## 2011-11-25 ENCOUNTER — Telehealth: Payer: Self-pay | Admitting: Family Medicine

## 2011-11-25 MED ORDER — LISINOPRIL-HYDROCHLOROTHIAZIDE 20-12.5 MG PO TABS: ORAL_TABLET | ORAL | Status: DC

## 2011-11-25 NOTE — Telephone Encounter (Signed)
Pt was here the other day and states that Dr Ernestina Patches was going to refill her Lisinopril - needs asap  [facility] Reichmann

## 2011-11-25 NOTE — Telephone Encounter (Signed)
Refill placed. Please inform pt.  Thank you.

## 2011-11-25 NOTE — Telephone Encounter (Signed)
Will forward message to Dr. Ernestina Patches.

## 2011-11-25 NOTE — Telephone Encounter (Signed)
Dr. Ernestina Patches notified and he will do today.

## 2011-11-25 NOTE — Telephone Encounter (Signed)
Patient notified

## 2011-12-07 NOTE — Progress Notes (Signed)
Subjective:  Pt presents today for chronic problem follow up. DM: Checking CBGs?:yes  How Often?:bid-tid; recording brought in with pt.  Blood Sugar Range:100s-190s am, 200s-300s pm and afternoon post prandial  Polyuria, polydypsia, polyphagia:no Symptomatic Hypoglycemia:no Medication Compliance?:yes ACE if hypertensive/obesity?:yes Statin if LDL >100?:yes  HTN:  Checking BPS Daily ?:yes BP Range:150s-160s  Any HA, CP, SOB?:chronic CP. Fully evaluated by cards for this. Stable.  Any Medication Side Effects:no Medication Compliance:yes Salt intake?:low per pt Exercise?:trying to go walking intermittently  Weight Loss?:no     Review of Systems - Negative except as noted above in HPI.   Objective:  Current Outpatient Prescriptions  Medication Sig Dispense Refill  . ARIPiprazole (ABILIFY) 10 MG tablet Take 20 mg by mouth daily.       [name] aspirin 81 MG tablet Take 1 tablet (81 mg total) by mouth daily.  30 tablet  11  . gabapentin (NEURONTIN) 100 MG capsule Take 1 capsule (100 mg total) by mouth 3 (three) times daily.  90 capsule  2  . insulin aspart (NOVOLOG) 100 UNIT/ML injection Inject 6 Units into the skin 3 (three) times daily before meals.  1 vial  3  . insulin NPH (HUMULIN N,NOVOLIN N) 100 UNIT/ML injection Inject 17 Units into the skin 2 (two) times daily.  1 vial  3  . Insulin Pen Needle (B-D ULTRAFINE III SHORT PEN) 31G X 8 MM MISC by Does not apply route. Use as directed       . Insulin Syringe-Needle U-100 (B-D INS SYR MICROFINE 1CC/27G) 27G X 5/8" 1 ML MISC by Does not apply route. Use as directed       . metoclopramide (REGLAN) 10 MG tablet Take 10 mg by mouth 3 (three) times daily as needed. 1 tab by mouth 10 mins prior to meals      . mirtazapine (REMERON) 30 MG tablet Take 1 tablet (30 mg total) by mouth at bedtime.  30 tablet  2  . omeprazole (PRILOSEC) 20 MG capsule Take 1 capsule (20 mg total) by mouth daily.      . pravastatin (PRAVACHOL) 80 MG tablet Take 1  tablet (80 mg total) by mouth every evening.  30 tablet  6  . ARIPiprazole (ABILIFY) 2 MG tablet Take 1 tablet (2 mg total) by mouth daily.      [name] lisinopril-hydrochlorothiazide (PRINZIDE,ZESTORETIC) 20-12.5 MG per tablet Take 2 tablets daily  60 tablet  6  . metoprolol tartrate (LOPRESSOR) 12.5 mg TABS Take 6.25 mg by mouth 2 (two) times daily.        [name] DISCONTD: insulin NPH (NOVOLIN N) 100 UNIT/ML injection 10 units twice daily  10 mL  3  . DISCONTD: omeprazole (PRILOSEC) 40 MG capsule Take 1 capsule (40 mg total) by mouth daily.  30 capsule  3   Current Facility-Administered Medications  Medication Dose Route Frequency Provider Last Rate Last Dose  . 0.9 %  sodium chloride infusion  500 mL Intravenous Continuous Gatha Mayer, MD      . sodium chloride 0.9 % injection 3 mL  3 mL Intravenous Q12H Larey Dresser, MD      . sodium chloride 0.9 % injection 3 mL  3 mL Intravenous PRN Larey Dresser, MD        Wt Readings from Last 3 Encounters:  11/18/11 142 lb 8 oz (64.638 kg)  10/24/11 140 lb (63.504 kg)  10/10/11 140 lb (63.504 kg)   Temp Readings from Last 3 Encounters:  10/24/11  98.4 F (36.9 C) Oral  09/15/11 98 F (36.7 C) Oral  07/10/11 98.2 F (36.8 C)    BP Readings from Last 3 Encounters:  11/18/11 151/81  10/24/11 141/84  10/10/11 165/89   Pulse Readings from Last 3 Encounters:  11/18/11 86  10/24/11 108  10/10/11 75    General: alert and cooperative HEENT: PERRLA and extra ocular movement intact Heart: S1, S2 normal, no murmur, rub or gallop, regular rate and rhythm Lungs: clear to auscultation Abdomen: abdomen is soft without significant tenderness, masses, organomegaly or guarding Extremities: extremities normal, atraumatic, no cyanosis or edema Skin:no rashes Neurology: normal without focal findings   Assessment/Plan:

## 2011-12-16 NOTE — Assessment & Plan Note (Signed)
Clinically stable   Continue current regimen

## 2011-12-16 NOTE — Assessment & Plan Note (Signed)
Checking A1C today.  Persistent post prandial hyperglycemia. Baseline brittle DM.  Will increase sliding scale from 6 to 7 units with meals.  Discussed hypoglycemic red flags.

## 2012-01-12 ENCOUNTER — Ambulatory Visit (INDEPENDENT_AMBULATORY_CARE_PROVIDER_SITE_OTHER): Payer: Medicaid Other | Admitting: Ophthalmology

## 2012-01-27 ENCOUNTER — Telehealth: Payer: Self-pay | Admitting: Cardiology

## 2012-01-27 NOTE — Telephone Encounter (Signed)
New Problem:     I called the patient and was unable to reach them. I left a message on their voicemail with my name, the reason I called, the name of his physician, and a number to call back to schedule their appointment. 

## 2012-03-02 ENCOUNTER — Encounter: Payer: Self-pay | Admitting: Family Medicine

## 2012-03-02 ENCOUNTER — Ambulatory Visit (INDEPENDENT_AMBULATORY_CARE_PROVIDER_SITE_OTHER): Payer: Self-pay | Admitting: Family Medicine

## 2012-03-02 VITALS — BP 148/80 | HR 96 | Temp 98.2°F | Ht 62.0 in | Wt 147.0 lb

## 2012-03-02 DIAGNOSIS — E785 Hyperlipidemia, unspecified: Secondary | ICD-10-CM

## 2012-03-02 DIAGNOSIS — I10 Essential (primary) hypertension: Secondary | ICD-10-CM

## 2012-03-02 DIAGNOSIS — R109 Unspecified abdominal pain: Secondary | ICD-10-CM

## 2012-03-02 DIAGNOSIS — E119 Type 2 diabetes mellitus without complications: Secondary | ICD-10-CM

## 2012-03-02 DIAGNOSIS — Z23 Encounter for immunization: Secondary | ICD-10-CM

## 2012-03-02 LAB — POCT GLYCOSYLATED HEMOGLOBIN (HGB A1C): Hemoglobin A1C: 10.8

## 2012-03-02 MED ORDER — MAGNESIUM CITRATE PO SOLN: 296.0000 mL | Freq: Once | ORAL | Status: DC

## 2012-03-02 MED ORDER — INSULIN ASPART 100 UNIT/ML ~~LOC~~ SOLN: 8.0000 [IU] | Freq: Three times a day (TID) | SUBCUTANEOUS | Status: DC

## 2012-03-02 MED ORDER — INSULIN NPH (HUMAN) (ISOPHANE) 100 UNIT/ML ~~LOC~~ SUSP: 20.0000 [IU] | Freq: Two times a day (BID) | SUBCUTANEOUS | Status: DC

## 2012-03-02 NOTE — Patient Instructions (Addendum)
Thank you for coming in today, it was good to see you I think your abdominal pain may be coming from your constipation, try the mag citrate to see if this helps Your A1c is 10.8, I would like for you to increase your NPH to 20 units two times per day.  Increase your novolog to 8 units before each meal.  K Keep track of your sugars and I will see you back in 2-3 weeks, we can make adjustments based on your sugar and I want to be sure your stomach pain is improving.

## 2012-03-07 DIAGNOSIS — R109 Unspecified abdominal pain: Secondary | ICD-10-CM | POA: Insufficient documentation

## 2012-03-07 NOTE — Assessment & Plan Note (Signed)
Likely related to chronic constipation.  Relatively benign abdominal examination.  Advised to try mag citrate and if not improving to return.

## 2012-03-07 NOTE — Progress Notes (Signed)
  Subjective:    Patient ID: [name], female    DOB: April 24, 1956, 56 y.o.   MRN: [id number]  HPI 1. DM:  Here for f/u of diabetes.  She has a hx of being a brittle diabetic.  CHRONIC DIABETES  Disease Monitoring  Blood Sugar Ranges: 99-120 fasting, 200-300 throughout the day  Polyuria: no   Visual problems: no   Medication Compliance: yes  Medication Side Effects  Hypoglycemia: no, nothing recently   [place] Exam: 08/2011  Foot Exam: 08/2011  Diet pattern: Generally healthy  Exercise: Occasional, walking.   Last A1c: Today    2. Abd Pain:  C/o L sided abdominal/flank pain.  She thinks this is related to her bowels not moving regularly as she has had this pain before.  Pain described at "cramp".  She states it is typical for her to only have 2-3 BM per month.  She denies blood in her stool, nausea, vomiting, dysuria, fever, chills.   She usually uses mag citrate for her constipation.  She did have a colonoscopy this year, 3 adenomas with recommendation to repeat in 3 years.    Review of Systems Per HPI    Objective:   Physical Exam  Constitutional: She appears well-nourished.  HENT:  Head: Normocephalic and atraumatic.  Eyes: Conjunctivae normal are normal.  Neck: Neck supple.  Cardiovascular: Normal rate and regular rhythm.   Pulmonary/Chest: Effort normal and breath sounds normal.  Abdominal: Soft. There is tenderness (Mild tenderness LLQ.  No CVA tenderness).       Hypoactive BS   Musculoskeletal: She exhibits no edema.  Neurological: She is alert.          Assessment & Plan:

## 2012-03-07 NOTE — Assessment & Plan Note (Signed)
Check LDL today

## 2012-03-07 NOTE — Assessment & Plan Note (Addendum)
A1c remains elevated today at 10.8.  Will increase NPH by one unit from 19-->20 units.  Patient hesitant to increase novolog by 4 units, will instead increase by 2 to 6 units with meals.

## 2012-03-17 ENCOUNTER — Encounter: Payer: Self-pay | Admitting: Family Medicine

## 2012-03-17 ENCOUNTER — Ambulatory Visit (INDEPENDENT_AMBULATORY_CARE_PROVIDER_SITE_OTHER): Payer: Self-pay | Admitting: Family Medicine

## 2012-03-17 VITALS — BP 180/100 | HR 101 | Ht 61.0 in | Wt 144.0 lb

## 2012-03-17 DIAGNOSIS — E119 Type 2 diabetes mellitus without complications: Secondary | ICD-10-CM

## 2012-03-17 DIAGNOSIS — I10 Essential (primary) hypertension: Secondary | ICD-10-CM

## 2012-03-17 NOTE — Assessment & Plan Note (Addendum)
Improved cbg with slight increase in NPH.  Unfortunately out of NPH, but states she should be able to obtain tomorrow.  Will plan to apply for orange card until she qualifies for medicaid. Discussed avoiding things that will make her blood sugar go up, especially without a basal insulin.

## 2012-03-17 NOTE — Progress Notes (Signed)
  Subjective:    Patient ID: [name], female    DOB: [birth year], 56 y.o.   MRN: [id number]  HPI 1. DM:  Here for interim f/u for diabetes.  She has been logging glucose over the past couple of weeks.  She has noticed improvement with increased doses, however she has ran out of NPH and cannot afford anymore at this time.  She still has plenty of NovoLog.  Her glucose has ranged from 150-200 fasting.  She did have a couple of outliers with one >500 after eating a bowl of ice cream.  She is working on [employer], [employer].  Denies any low glucose, increased urination or thirst.   2. HTN:  Blood pressure elevated this am.  Has not taken medication because she states she overslept and didn't have time before her appt. This am.  She will take them when she gets home.  She denies chest pain, headache, palpitations, vision changes.    Review of Systems Per HPI    Objective:   Physical Exam  Constitutional: She appears well-nourished. No distress.  Cardiovascular: Normal rate, regular rhythm and normal heart sounds.   Pulmonary/Chest: Effort normal and breath sounds normal.  Musculoskeletal: She exhibits no edema.          Assessment & Plan:

## 2012-03-17 NOTE — Assessment & Plan Note (Signed)
BP quite high today, however has not taken her medication.  Instructed to take meds when she gets home.  Will not make any changes at this time.

## 2012-03-17 NOTE — Patient Instructions (Addendum)
Use your novolog for meal correction Try to get your NPH as soon as you can. Take your blood pressure medication when you get home.

## 2012-04-26 ENCOUNTER — Encounter: Payer: Self-pay | Admitting: Home Health Services

## 2012-04-27 ENCOUNTER — Encounter: Payer: Self-pay | Admitting: Home Health Services

## 2012-05-04 ENCOUNTER — Other Ambulatory Visit: Payer: Self-pay | Admitting: Family Medicine

## 2012-05-04 ENCOUNTER — Ambulatory Visit (INDEPENDENT_AMBULATORY_CARE_PROVIDER_SITE_OTHER): Payer: Self-pay | Admitting: *Deleted

## 2012-05-04 VITALS — BP 180/94 | HR 100

## 2012-05-04 DIAGNOSIS — I10 Essential (primary) hypertension: Secondary | ICD-10-CM

## 2012-05-04 MED ORDER — METOPROLOL TARTRATE 25 MG PO TABS: 12.5000 mg | ORAL_TABLET | Freq: Two times a day (BID) | ORAL | Status: DC

## 2012-05-04 NOTE — Progress Notes (Signed)
Patient in office as directed for BP check.  She reports at times recently she has felt that her BP has dropped when she changes positions.  Once last week she ws reaching up in closet and felt dizzy and passed out she states.  Then 2 nights ago was lying in bed and felt BP was low and checked BP at 80/50 lying.  Then she sat up and checked at 115/80. Checked BS also at 200.    Orthostatic BP checked.  BP LA 200/100 lying,  pulse 104 , BP  200/97 sitting  pulse 100 and standing 180/94, pulse 100.  Patient reports slight dizziness when she sat up but denied dizziness when she stood up. Reviewed meds  and patient states  Dr. Ernestina Patches stopped  Coreg and Lopressor. Dr. Zigmund Daniel notified   and he will restart Lopressor now. Return in one week for BP  follow up with Dr. Zigmund Daniel.

## 2012-05-11 ENCOUNTER — Encounter: Payer: Self-pay | Admitting: Family Medicine

## 2012-05-11 ENCOUNTER — Ambulatory Visit (INDEPENDENT_AMBULATORY_CARE_PROVIDER_SITE_OTHER): Payer: Self-pay | Admitting: Family Medicine

## 2012-05-11 VITALS — BP 133/77 | HR 111 | Temp 98.1°F | Ht 61.0 in | Wt 150.1 lb

## 2012-05-11 DIAGNOSIS — E1165 Type 2 diabetes mellitus with hyperglycemia: Secondary | ICD-10-CM

## 2012-05-11 DIAGNOSIS — E119 Type 2 diabetes mellitus without complications: Secondary | ICD-10-CM

## 2012-05-11 DIAGNOSIS — I10 Essential (primary) hypertension: Secondary | ICD-10-CM

## 2012-05-11 DIAGNOSIS — IMO0001 Reserved for inherently not codable concepts without codable children: Secondary | ICD-10-CM

## 2012-05-11 MED ORDER — INSULIN NPH (HUMAN) (ISOPHANE) 100 UNIT/ML ~~LOC~~ SUSP: SUBCUTANEOUS | Status: DC

## 2012-05-11 MED ORDER — METOPROLOL TARTRATE 25 MG PO TABS: 25.0000 mg | ORAL_TABLET | Freq: Two times a day (BID) | ORAL | Status: DC

## 2012-05-11 NOTE — Patient Instructions (Addendum)
Thank you for coming in today, it was good to see you Your blood pressure looks great today. Continue on your current medications Increase your morning NPH to 27 units, keep your night time at 25 units. Follow up with me in 1 month to see how your sugars look

## 2012-05-11 NOTE — Progress Notes (Signed)
Patient brought in her BP monitor for comparison.  Her BP reading on her machine was 143/76, pulse 105. Lauralyn Primes

## 2012-05-16 NOTE — Progress Notes (Signed)
  Subjective:    Patient ID: [name], female    DOB: [birth year], 56 y.o.   MRN: [id number]  HPI 1. HTN:  Started back on metoprolol after last nurse visit.  Also on  Lisinopril-hctz.  She is tolerating this medication well.  She does monitor her BP at home and typically gets in the low [id number] systolic and [id number] diastolic.  She denies chest pain, palpitations, headache, dizziness or vision changes.   2. DM:  Brings in her log book of her glucose.  Fasting blood glucose in the am is fairly well controlled around 120-150, however her evening sugars are still quite high >200.  She is currently using 25 units of NPH BID.  She denies any symptoms of hypoglycemia but reports she drops pretty easily in the past with large increases in insulin.    Review of Systems Per HPI    Objective:   Physical Exam  Constitutional: She appears well-nourished. No distress.  Cardiovascular: Normal rate, regular rhythm and normal heart sounds.   Pulmonary/Chest: Effort normal and breath sounds normal.  Musculoskeletal: She exhibits no edema.          Assessment & Plan:

## 2012-05-16 NOTE — Assessment & Plan Note (Signed)
Will increase NPH slightly to 27 units in the morning.  Keep night time NPH the same.  Will f/u in one month to review glucose.

## 2012-05-17 NOTE — Assessment & Plan Note (Signed)
BP looks much better today.  Continue current medications, f/u in one month.

## 2012-05-19 ENCOUNTER — Ambulatory Visit: Payer: Medicaid Other | Admitting: Family Medicine

## 2012-06-14 ENCOUNTER — Encounter: Payer: Self-pay | Admitting: Family Medicine

## 2012-06-14 ENCOUNTER — Ambulatory Visit (INDEPENDENT_AMBULATORY_CARE_PROVIDER_SITE_OTHER): Payer: Self-pay | Admitting: Family Medicine

## 2012-06-14 VITALS — BP 146/85 | HR 89 | Ht 61.0 in | Wt 149.0 lb

## 2012-06-14 DIAGNOSIS — IMO0001 Reserved for inherently not codable concepts without codable children: Secondary | ICD-10-CM

## 2012-06-14 DIAGNOSIS — E1165 Type 2 diabetes mellitus with hyperglycemia: Secondary | ICD-10-CM

## 2012-06-14 DIAGNOSIS — I10 Essential (primary) hypertension: Secondary | ICD-10-CM

## 2012-06-14 NOTE — Patient Instructions (Addendum)
Thank you for coming in today, it was good to see you Your A1c has improved continue your current insulin therapy Keep an eye on what you eat and how it affects your blood pressure and sugars Start doing some light exercise such as walking.  Follow up with me in 3 months

## 2012-06-16 NOTE — Assessment & Plan Note (Signed)
Manual recheck of blood pressure 135/85.  No changes to medications. Advised increased exercise, avoid foods high in salt.

## 2012-06-16 NOTE — Assessment & Plan Note (Signed)
A1c improved from previous.  Some sugars in the 400's but  i suspect her glucose control will improve now that the holidays have passed. Will not make any changes to insulin at this time.  Advised to increase exercise.

## 2012-06-16 NOTE — Progress Notes (Signed)
  Subjective:    Patient ID: [name], female    DOB: 11/29/1955, 57 y.o.   MRN: [id number]  HPI  1.  DM:  Here for f/u on DM.  She brings a log book with her glucose today.  CHRONIC DIABETES  Disease Monitoring  Blood Sugar Ranges: 160-400.  She has had some higher readings over the holidays but has  been giving herself extra novolog for correction.   Polyuria: no   Visual problems: no   Medication Compliance: yes  Medication Side Effects  Hypoglycemia: 1-2 occasions into the 60s during the middle of the night.  Wakes up sweating.   Preventitive Health Care  Eye Exam: Will be due in 1-2 months  Diet pattern: Generally poor, does not limit any types of foods she eats.   Exercise: None    2. HTN; CHRONIC HYPERTENSION  Disease Monitoring  Blood pressure range: Does not self monitor at home.   Chest pain: no   Dyspnea: no   Claudication: no   Medication compliance: yes  Medication Side Effects  Lightheadedness: no   Urinary frequency: no   Edema: no   Preventitive Healthcare:  Salt Restriction: Does not watch salt intake, but has cut out pork.     Review of Systems Per HPI    Objective:   Physical Exam  Constitutional: She appears well-nourished. No distress.  HENT:  Head: Normocephalic and atraumatic.  Eyes: No scleral icterus.  Neck: Neck supple.  Cardiovascular: Normal rate and regular rhythm.   Pulmonary/Chest: Effort normal and breath sounds normal.  Abdominal: Soft.  Musculoskeletal: She exhibits no edema.  Neurological: She is alert.          Assessment & Plan:

## 2012-07-08 ENCOUNTER — Encounter: Payer: Self-pay | Admitting: Family Medicine

## 2012-07-08 ENCOUNTER — Ambulatory Visit (INDEPENDENT_AMBULATORY_CARE_PROVIDER_SITE_OTHER): Payer: No Typology Code available for payment source | Admitting: Family Medicine

## 2012-07-08 VITALS — BP 170/94 | HR 99 | Ht 61.0 in | Wt 153.0 lb

## 2012-07-08 DIAGNOSIS — M25519 Pain in unspecified shoulder: Secondary | ICD-10-CM

## 2012-07-08 DIAGNOSIS — M25511 Pain in right shoulder: Secondary | ICD-10-CM

## 2012-07-11 DIAGNOSIS — M25511 Pain in right shoulder: Secondary | ICD-10-CM | POA: Insufficient documentation

## 2012-07-11 NOTE — Assessment & Plan Note (Signed)
Likely subacromial bursitis, injected today as this has been helpful in the past.  Advised to return if no improvement after injection.

## 2012-07-11 NOTE — Progress Notes (Signed)
  Subjective:    Patient ID: [name], female    DOB: 06-08-1956, 57 y.o.   MRN: [id number]  HPI  1. Shoulder pain:  Here with complaint of R shoulder pain.  Has been seen for this problem before and received injection in May, which worked well.  Pain is worse with overhead movement and reaching across her chest.  Pain often wakes her from sleep when she rolls onto R arm.  She denies any numbness or tingling into arm or hand.  Denies trauma or overuse.  Review of Systems Per HPI    Objective:   Physical Exam  Constitutional: She appears well-nourished. No distress.  HENT:  Head: Normocephalic and atraumatic.  Neck: Normal range of motion.       Negative spurlings   Musculoskeletal:       ROM is limited with overhead movement.  Negative drop arm sign.  + Hawkins and neer tests.  Strength limited due to pain.        INJECTION: Patient was given informed consent, signed copy in the chart. Appropriate time out was taken. Area prepped and draped in usual sterile fashion. 1 cc of methylprednisolone 40 mg/ml plus  4 cc of 1% lidocaine without epinephrine was injected into the R subacromial space using a(n) posterior approach. The patient tolerated the procedure well. There were no complications. Post procedure instructions were given.     Assessment & Plan:

## 2012-08-12 ENCOUNTER — Emergency Department (HOSPITAL_COMMUNITY): Payer: Medicaid Other

## 2012-08-12 ENCOUNTER — Inpatient Hospital Stay (HOSPITAL_COMMUNITY)
Admission: EM | Admit: 2012-08-12 | Discharge: 2012-08-16 | DRG: 917 | Disposition: A | Discharge status: Home / Self Care | Payer: Medicaid Other | Attending: Family Medicine | Admitting: Family Medicine

## 2012-08-12 DIAGNOSIS — T383X1A Poisoning by insulin and oral hypoglycemic [antidiabetic] drugs, accidental (unintentional), initial encounter: Principal | ICD-10-CM | POA: Diagnosis present

## 2012-08-12 DIAGNOSIS — I959 Hypotension, unspecified: Secondary | ICD-10-CM | POA: Diagnosis not present

## 2012-08-12 DIAGNOSIS — E875 Hyperkalemia: Secondary | ICD-10-CM | POA: Diagnosis not present

## 2012-08-12 DIAGNOSIS — Z794 Long term (current) use of insulin: Secondary | ICD-10-CM

## 2012-08-12 DIAGNOSIS — K219 Gastro-esophageal reflux disease without esophagitis: Secondary | ICD-10-CM | POA: Diagnosis present

## 2012-08-12 DIAGNOSIS — IMO0001 Reserved for inherently not codable concepts without codable children: Secondary | ICD-10-CM | POA: Diagnosis present

## 2012-08-12 DIAGNOSIS — Z87891 Personal history of nicotine dependence: Secondary | ICD-10-CM | POA: Diagnosis present

## 2012-08-12 DIAGNOSIS — G9340 Encephalopathy, unspecified: Secondary | ICD-10-CM | POA: Diagnosis present

## 2012-08-12 DIAGNOSIS — Z23 Encounter for immunization: Secondary | ICD-10-CM

## 2012-08-12 DIAGNOSIS — K3184 Gastroparesis: Secondary | ICD-10-CM | POA: Diagnosis present

## 2012-08-12 DIAGNOSIS — E1149 Type 2 diabetes mellitus with other diabetic neurological complication: Secondary | ICD-10-CM | POA: Diagnosis present

## 2012-08-12 DIAGNOSIS — F1210 Cannabis abuse, uncomplicated: Secondary | ICD-10-CM | POA: Diagnosis present

## 2012-08-12 DIAGNOSIS — Z8249 Family history of ischemic heart disease and other diseases of the circulatory system: Secondary | ICD-10-CM

## 2012-08-12 DIAGNOSIS — D649 Anemia, unspecified: Secondary | ICD-10-CM | POA: Diagnosis present

## 2012-08-12 DIAGNOSIS — M129 Arthropathy, unspecified: Secondary | ICD-10-CM | POA: Diagnosis present

## 2012-08-12 DIAGNOSIS — F3189 Other bipolar disorder: Secondary | ICD-10-CM | POA: Diagnosis present

## 2012-08-12 DIAGNOSIS — F17200 Nicotine dependence, unspecified, uncomplicated: Secondary | ICD-10-CM

## 2012-08-12 DIAGNOSIS — Z79899 Other long term (current) drug therapy: Secondary | ICD-10-CM

## 2012-08-12 DIAGNOSIS — I10 Essential (primary) hypertension: Secondary | ICD-10-CM | POA: Diagnosis present

## 2012-08-12 DIAGNOSIS — E1143 Type 2 diabetes mellitus with diabetic autonomic (poly)neuropathy: Secondary | ICD-10-CM | POA: Diagnosis present

## 2012-08-12 DIAGNOSIS — F339 Major depressive disorder, recurrent, unspecified: Secondary | ICD-10-CM | POA: Diagnosis present

## 2012-08-12 DIAGNOSIS — Y92009 Unspecified place in unspecified non-institutional (private) residence as the place of occurrence of the external cause: Secondary | ICD-10-CM

## 2012-08-12 DIAGNOSIS — T383X5A Adverse effect of insulin and oral hypoglycemic [antidiabetic] drugs, initial encounter: Secondary | ICD-10-CM

## 2012-08-12 DIAGNOSIS — E1169 Type 2 diabetes mellitus with other specified complication: Secondary | ICD-10-CM | POA: Diagnosis present

## 2012-08-12 DIAGNOSIS — E160 Drug-induced hypoglycemia without coma: Secondary | ICD-10-CM

## 2012-08-12 DIAGNOSIS — M797 Fibromyalgia: Secondary | ICD-10-CM | POA: Diagnosis present

## 2012-08-12 DIAGNOSIS — R933 Abnormal findings on diagnostic imaging of other parts of digestive tract: Secondary | ICD-10-CM | POA: Diagnosis present

## 2012-08-12 DIAGNOSIS — E1142 Type 2 diabetes mellitus with diabetic polyneuropathy: Secondary | ICD-10-CM | POA: Diagnosis present

## 2012-08-12 DIAGNOSIS — T38801A Poisoning by unspecified hormones and synthetic substitutes, accidental (unintentional), initial encounter: Secondary | ICD-10-CM | POA: Diagnosis present

## 2012-08-12 DIAGNOSIS — F411 Generalized anxiety disorder: Secondary | ICD-10-CM | POA: Diagnosis present

## 2012-08-12 DIAGNOSIS — E1049 Type 1 diabetes mellitus with other diabetic neurological complication: Secondary | ICD-10-CM

## 2012-08-12 DIAGNOSIS — IMO0002 Reserved for concepts with insufficient information to code with codable children: Secondary | ICD-10-CM | POA: Diagnosis present

## 2012-08-12 DIAGNOSIS — J9690 Respiratory failure, unspecified, unspecified whether with hypoxia or hypercapnia: Secondary | ICD-10-CM | POA: Diagnosis present

## 2012-08-12 DIAGNOSIS — J9600 Acute respiratory failure, unspecified whether with hypoxia or hypercapnia: Secondary | ICD-10-CM | POA: Diagnosis present

## 2012-08-12 DIAGNOSIS — E1165 Type 2 diabetes mellitus with hyperglycemia: Secondary | ICD-10-CM

## 2012-08-12 DIAGNOSIS — E785 Hyperlipidemia, unspecified: Secondary | ICD-10-CM | POA: Diagnosis present

## 2012-08-12 DIAGNOSIS — F329 Major depressive disorder, single episode, unspecified: Secondary | ICD-10-CM

## 2012-08-12 LAB — CBC WITH DIFFERENTIAL/PLATELET
Basophils Relative: 0 % (ref 0–1)
Eosinophils Absolute: 0.1 10*3/uL (ref 0.0–0.7)
HCT: 32.4 % — ABNORMAL LOW (ref 36.0–46.0)
HCT: 36.1 % (ref 36.0–46.0)
Hemoglobin: 11.1 g/dL — ABNORMAL LOW (ref 12.0–15.0)
Lymphocytes Relative: 58 % — ABNORMAL HIGH (ref 12–46)
Lymphs Abs: 7.6 10*3/uL — ABNORMAL HIGH (ref 0.7–4.0)
MCH: 31.3 pg (ref 26.0–34.0)
MCHC: 34.3 g/dL (ref 30.0–36.0)
MCHC: 33.5 g/dL (ref 30.0–36.0)
MCV: 91.3 fL (ref 78.0–100.0)
Neutro Abs: 4.4 10*3/uL (ref 1.7–7.7)
RDW: 15 % (ref 11.5–15.5)
WBC: 9.1 10*3/uL (ref 4.0–10.5)

## 2012-08-12 LAB — POCT I-STAT, CHEM 8
BUN: 33 mg/dL — ABNORMAL HIGH (ref 6–23)
Chloride: 111 mEq/L (ref 96–112)
Creatinine, Ser: 0.8 mg/dL (ref 0.50–1.10)
HCT: 34 % — ABNORMAL LOW (ref 36.0–46.0)
Potassium: 3.6 mEq/L (ref 3.5–5.1)
Sodium: 149 mEq/L — ABNORMAL HIGH (ref 135–145)
TCO2: 27 mmol/L (ref 0–100)

## 2012-08-12 LAB — URINALYSIS, ROUTINE W REFLEX MICROSCOPIC
Bilirubin Urine: NEGATIVE
Glucose, UA: NEGATIVE mg/dL
Ketones, ur: NEGATIVE mg/dL
Leukocytes, UA: NEGATIVE
Protein, ur: NEGATIVE mg/dL

## 2012-08-12 LAB — URINALYSIS, MICROSCOPIC ONLY
Glucose, UA: NEGATIVE mg/dL
Hgb urine dipstick: NEGATIVE
Ketones, ur: NEGATIVE mg/dL
Leukocytes, UA: NEGATIVE
pH: 5 (ref 5.0–8.0)

## 2012-08-12 LAB — COMPREHENSIVE METABOLIC PANEL
Alkaline Phosphatase: 129 U/L — ABNORMAL HIGH (ref 39–117)
BUN: 22 mg/dL (ref 6–23)
CO2: 23 mEq/L (ref 19–32)
Calcium: 9.3 mg/dL (ref 8.4–10.5)
GFR calc Af Amer: >90 mL/min (ref >90)
GFR calc non Af Amer: >90 mL/min (ref >90)
Glucose, Bld: 270 mg/dL — ABNORMAL HIGH (ref 70–99)
Potassium: 2.9 mEq/L — ABNORMAL LOW (ref 3.5–5.1)
Total Protein: 7.6 g/dL (ref 6.0–8.3)

## 2012-08-12 LAB — GLUCOSE, CAPILLARY
Glucose-Capillary: 265 mg/dL — ABNORMAL HIGH (ref 70–99)
Glucose-Capillary: 172 mg/dL — ABNORMAL HIGH (ref 70–99)
Glucose-Capillary: 239 mg/dL — ABNORMAL HIGH (ref 70–99)

## 2012-08-12 LAB — BASIC METABOLIC PANEL
BUN: 20 mg/dL (ref 6–23)
CO2: 21 mEq/L (ref 19–32)
Calcium: 8.1 mg/dL — ABNORMAL LOW (ref 8.4–10.5)
Creatinine, Ser: 0.6 mg/dL (ref 0.50–1.10)
Glucose, Bld: 456 mg/dL — ABNORMAL HIGH (ref 70–99)

## 2012-08-12 LAB — POCT I-STAT 3, ART BLOOD GAS (G3+)
Bicarbonate: 24.8 mEq/L — ABNORMAL HIGH (ref 20.0–24.0)
O2 Saturation: 98 %
Patient temperature: 98.6
pO2, Arterial: 108 mmHg — ABNORMAL HIGH (ref 80.0–100.0)

## 2012-08-12 LAB — PREGNANCY, URINE: Preg Test, Ur: NEGATIVE

## 2012-08-12 LAB — HIV RAPID SCREEN (BLD OR BODY FLD EXPOSURE): Rapid HIV Screen: NONREACTIVE

## 2012-08-12 LAB — RAPID URINE DRUG SCREEN, HOSP PERFORMED: Benzodiazepines: NOT DETECTED

## 2012-08-12 LAB — HEPATITIS C ANTIBODY (REFLEX): HCV Ab: NEGATIVE

## 2012-08-12 MED ORDER — FAMOTIDINE IN NACL 20-0.9 MG/50ML-% IV SOLN
20.0000 mg | Freq: Two times a day (BID) | INTRAVENOUS | Status: DC
  Administered 2012-08-12 – 2012-08-13 (×3): 20 mg via INTRAVENOUS
  Filled 2012-08-12 (×4): qty 50

## 2012-08-12 MED ORDER — SODIUM CHLORIDE 0.9 % IV SOLN: 250.0000 mL | INTRAVENOUS | Status: DC | PRN

## 2012-08-12 MED ORDER — DEXTROSE 50 % IV SOLN
50.0000 mL | Freq: Once | INTRAVENOUS | Status: AC
  Administered 2012-08-12: 50 mL via INTRAVENOUS
  Filled 2012-08-12 (×2): qty 50

## 2012-08-12 MED ORDER — BIOTENE DRY MOUTH MT LIQD
15.0000 mL | Freq: Four times a day (QID) | OROMUCOSAL | Status: DC
  Administered 2012-08-13 (×2): 15 mL via OROMUCOSAL

## 2012-08-12 MED ORDER — NALOXONE HCL 0.4 MG/ML IJ SOLN
INTRAMUSCULAR | Status: DC | PRN
  Administered 2012-08-12: 2 mg via INTRAVENOUS

## 2012-08-12 MED ORDER — KCL IN DEXTROSE-NACL 20-5-0.45 MEQ/L-%-% IV SOLN
INTRAVENOUS | Status: DC
  Administered 2012-08-12: 15:00:00 via INTRAVENOUS
  Administered 2012-08-13: 1000 mL via INTRAVENOUS
  Filled 2012-08-12 (×4): qty 1000

## 2012-08-12 MED ORDER — CHLORHEXIDINE GLUCONATE 0.12 % MT SOLN
15.0000 mL | Freq: Two times a day (BID) | OROMUCOSAL | Status: DC
  Administered 2012-08-12 – 2012-08-13 (×2): 15 mL via OROMUCOSAL
  Filled 2012-08-12 (×2): qty 15

## 2012-08-12 MED ORDER — SODIUM CHLORIDE 0.9 % IV BOLUS (SEPSIS)
1000.0000 mL | Freq: Once | INTRAVENOUS | Status: AC
  Administered 2012-08-12: 1000 mL via INTRAVENOUS

## 2012-08-12 MED ORDER — ETOMIDATE 2 MG/ML IV SOLN
INTRAVENOUS | Status: AC
  Filled 2012-08-12: qty 20

## 2012-08-12 MED ORDER — DEXTROSE-NACL 5-0.45 % IV SOLN
INTRAVENOUS | Status: DC
  Administered 2012-08-12: 14:00:00 via INTRAVENOUS

## 2012-08-12 MED ORDER — ROCURONIUM BROMIDE 50 MG/5ML IV SOLN
INTRAVENOUS | Status: AC
  Filled 2012-08-12: qty 2

## 2012-08-12 MED ORDER — INSULIN ASPART 100 UNIT/ML ~~LOC~~ SOLN
0.0000 [IU] | SUBCUTANEOUS | Status: DC
  Administered 2012-08-12: 3 [IU] via SUBCUTANEOUS
  Administered 2012-08-12 – 2012-08-13 (×2): 2 [IU] via SUBCUTANEOUS
  Administered 2012-08-13: 3 [IU] via SUBCUTANEOUS
  Administered 2012-08-13 (×2): 2 [IU] via SUBCUTANEOUS
  Administered 2012-08-13: 3 [IU] via SUBCUTANEOUS
  Administered 2012-08-14: 2 [IU] via SUBCUTANEOUS
  Administered 2012-08-14 (×3): 3 [IU] via SUBCUTANEOUS
  Administered 2012-08-15 (×2): 1 [IU] via SUBCUTANEOUS
  Administered 2012-08-15: 3 [IU] via SUBCUTANEOUS

## 2012-08-12 MED ORDER — DEXTROSE 50 % IV SOLN
50.0000 mL | Freq: Once | INTRAVENOUS | Status: AC
  Administered 2012-08-12: 50 mL via INTRAVENOUS

## 2012-08-12 MED ORDER — LIDOCAINE HCL (CARDIAC) 20 MG/ML IV SOLN
INTRAVENOUS | Status: AC
  Filled 2012-08-12: qty 5

## 2012-08-12 MED ORDER — LABETALOL HCL 5 MG/ML IV SOLN
INTRAVENOUS | Status: AC
  Administered 2012-08-12: 20 mg
  Filled 2012-08-12: qty 4

## 2012-08-12 MED ORDER — MIDAZOLAM HCL 2 MG/2ML IJ SOLN
INTRAMUSCULAR | Status: AC
  Administered 2012-08-12: 2 mg via INTRAVENOUS
  Filled 2012-08-12: qty 2

## 2012-08-12 MED ORDER — ETOMIDATE 2 MG/ML IV SOLN
INTRAVENOUS | Status: DC | PRN
  Administered 2012-08-12: 20 mg via INTRAVENOUS

## 2012-08-12 MED ORDER — MIDAZOLAM HCL 2 MG/2ML IJ SOLN
2.0000 mg | INTRAMUSCULAR | Status: DC | PRN
  Administered 2012-08-12 – 2012-08-13 (×4): 2 mg via INTRAVENOUS
  Filled 2012-08-12 (×4): qty 2

## 2012-08-12 MED ORDER — FENTANYL CITRATE 0.05 MG/ML IJ SOLN
50.0000 ug | INTRAMUSCULAR | Status: DC | PRN
  Administered 2012-08-12 (×3): 100 ug via INTRAVENOUS
  Administered 2012-08-12: 50 ug via INTRAVENOUS
  Administered 2012-08-13 (×2): 100 ug via INTRAVENOUS
  Filled 2012-08-12 (×5): qty 2

## 2012-08-12 MED ORDER — SUCCINYLCHOLINE CHLORIDE 20 MG/ML IJ SOLN
INTRAMUSCULAR | Status: AC
  Filled 2012-08-12: qty 1

## 2012-08-12 MED ORDER — FENTANYL CITRATE 0.05 MG/ML IJ SOLN
INTRAMUSCULAR | Status: AC
  Administered 2012-08-12: 50 ug via INTRAVENOUS
  Filled 2012-08-12: qty 2

## 2012-08-12 MED ORDER — HYDRALAZINE HCL 20 MG/ML IJ SOLN
10.0000 mg | INTRAMUSCULAR | Status: DC | PRN
  Administered 2012-08-12: 20 mg via INTRAVENOUS
  Filled 2012-08-12: qty 1
  Filled 2012-08-12: qty 2

## 2012-08-12 MED ORDER — ROCURONIUM BROMIDE 50 MG/5ML IV SOLN
INTRAVENOUS | Status: DC | PRN
  Administered 2012-08-12: 80 mg via INTRAVENOUS

## 2012-08-12 NOTE — ED Provider Notes (Signed)
History     CSN: [id number]  Arrival date & time 08/12/12  1244   First MD Initiated Contact with Patient 08/12/12 1302     CC: AMS  HPI [name] is a 57 y.o. female who presented to the ED for concern of AMS by EMS.  Patient found lying in bed by room mates.  Last seen normal 3 hours PTA.  Next to patient was hypodermic needle and spoon with amounts of unknown substance on spoon.  Room mates know that patient has history of diabetes and tried to replete sugar by putting snickers in patient's mouth.  When patient had no response, EMS called.  EMS removed food from airway.  Patient with GCS of 3.  FSBG 102.  No response to 2mg  narcan.  Brought in here.  Past Medical History  Diagnosis Date  . DM (diabetes mellitus), type 2   . Hypercholesteremia   . HTN (hypertension)   . Trigger finger     r index inj 12/08, bilateral thumb inj 11/09, r thumb and dequervain's inj 01/2009  . Bipolar 2 disorder   . Marijuana abuse   . Anxiety   . Arthritis   . Depression   . GERD (gastroesophageal reflux disease)     Past Surgical History  Procedure Laterality Date  . Abdominal hysterectomy  1986    fibroids  . Cardiac catheterization  05/06/2004    normal LV fxn EF>60%  . Perirpheral iridectomy  02/12/2005    Family History  Problem Relation Age of Onset  . Diabetes Mother   . Heart disease Mother   . Heart attack Father   . Bipolar disorder Father   . Bipolar disorder Sister   . Colon cancer Neg Hx   . Esophageal cancer Neg Hx   . Bipolar disorder Brother     History  Substance Use Topics  . Smoking status: Current Every Day Smoker -- 0.50 packs/day for 30 years    Types: Cigarettes  . Smokeless tobacco: Never Used  . Alcohol Use: No    OB History   Grav Para Term Preterm Abortions TAB SAB Ect Mult Living                  Review of Systems  Unable to perform ROS Psychiatric/Behavioral: Negative.     Allergies  Ibuprofen  Home Medications   Current  Outpatient Rx  Name  Route  Sig  Dispense  Refill  . ARIPiprazole (ABILIFY) 10 MG tablet   Oral   Take 20 mg by mouth daily.          [name] EXPIRED: ARIPiprazole (ABILIFY) 2 MG tablet   Oral   Take 1 tablet (2 mg total) by mouth daily.         [name] aspirin 81 MG tablet   Oral   Take 1 tablet (81 mg total) by mouth daily.   30 tablet   11   . EXPIRED: gabapentin (NEURONTIN) 100 MG capsule   Oral   Take 1 capsule (100 mg total) by mouth 3 (three) times daily.   90 capsule   2   . insulin aspart (NOVOLOG) 100 UNIT/ML injection   Subcutaneous   Inject 8 Units into the skin 3 (three) times daily before meals.   1 vial   3   . insulin glargine (LANTUS) 100 UNIT/ML injection   Subcutaneous   Inject into the skin at bedtime.         . insulin NPH (  HUMULIN N,NOVOLIN N) 100 UNIT/ML injection      Inject 27 units qam and 25 units qpm   1 vial   6   . Insulin Pen Needle (B-D ULTRAFINE III SHORT PEN) 31G X 8 MM MISC   Does not apply   by Does not apply route. Use as directed          . Insulin Syringe-Needle U-100 (B-D INS SYR MICROFINE 1CC/27G) 27G X 5/8" 1 ML MISC   Does not apply   by Does not apply route. Use as directed          . lisinopril-hydrochlorothiazide (PRINZIDE,ZESTORETIC) 20-12.5 MG per tablet      Take 2 tablets daily   60 tablet   6   . magnesium citrate solution   Oral   Take 296 mLs by mouth once.   300 mL   0   . metoclopramide (REGLAN) 10 MG tablet   Oral   Take 10 mg by mouth 3 (three) times daily as needed. 1 tab by mouth 10 mins prior to meals         . metoprolol tartrate (LOPRESSOR) 25 MG tablet   Oral   Take 1 tablet (25 mg total) by mouth 2 (two) times daily.   60 tablet   3   . EXPIRED: mirtazapine (REMERON) 30 MG tablet   Oral   Take 1 tablet (30 mg total) by mouth at bedtime.   30 tablet   2   . omeprazole (PRILOSEC) 20 MG capsule   Oral   Take 1 capsule (20 mg total) by mouth daily.         [name] EXPIRED: pravastatin  (PRAVACHOL) 80 MG tablet   Oral   Take 1 tablet (80 mg total) by mouth every evening.   30 tablet   6     BP 173/88  Pulse 91  Resp 23  SpO2 100%  Physical Exam  Constitutional: No distress.  HENT:  Head: Normocephalic and atraumatic.  Right Ear: External ear normal.  Left Ear: External ear normal.  Mouth/Throat: Oropharynx is clear and moist. No oropharyngeal exudate.  Eyes: Conjunctivae and EOM are normal. Pupils are equal, round, and reactive to light. Right eye exhibits no discharge. Left eye exhibits no discharge.  Neck: Normal range of motion. No tracheal deviation present.  Cardiovascular: Normal rate and regular rhythm.   Pulmonary/Chest: Effort normal and breath sounds normal. She has no wheezes. She has no rales.  Abdominal: Soft. She exhibits no mass.  Musculoskeletal: She exhibits no edema.  Neurological: GCS eye subscore is 1. GCS verbal subscore is 1. GCS motor subscore is 1.  Skin: Skin is warm. She is diaphoretic.    ED Course  INTUBATION Date/Time: 08/12/2012 3:47 PM Performed by: Rogelia Mire Authorized by: Rogelia Mire Consent: The procedure was performed in an emergent situation. Patient identity confirmed: arm band Indications: airway protection Intubation method: video-assisted Patient status: paralyzed (RSI) Sedatives: etomidate Paralytic: rocuronium Laryngoscope size: Mac 4 Tube size: 7.5 mm Tube type: cuffed Number of attempts: 3 Ventilation between attempts: BVM Cricoid pressure: yes Cords visualized: yes Post-procedure assessment: chest rise and CO2 detector Breath sounds: equal Cuff inflated: yes Tube secured with: ETT holder Chest x-ray interpreted by me. Chest x-ray findings: endotracheal tube in appropriate position Comments: Vomitus in mouth at initiation of procedure.  Two attempts by myself and ultimate success by attending Dr. Wyvonnia Dusky on third attempt with glidescope.     (including critical care time)  Labs Reviewed  CBC  WITH DIFFERENTIAL  COMPREHENSIVE METABOLIC PANEL  TROPONIN I  ETHANOL  ACETAMINOPHEN LEVEL  SALICYLATE LEVEL  URINALYSIS, MICROSCOPIC ONLY  URINE RAPID DRUG SCREEN (HOSP PERFORMED)  PREGNANCY, URINE   Ct Head Wo Contrast  08/12/2012  *RADIOLOGY REPORT*  Clinical Data: Drug overdose. Found down.  CT HEAD WITHOUT CONTRAST  Technique:  Contiguous axial images were obtained from the base of the skull through the vertex without contrast.  Comparison: Head CT 02/19/2011.  Findings: No acute intracranial abnormalities.  Specifically, no evidence of acute intracranial hemorrhage, no definite findings of acute/subacute cerebral ischemia, no mass, mass effect, hydrocephalus or abnormal intra or extra-axial fluid collections. Visualized paranasal sinuses and mastoids are well pneumatized.  No acute displaced skull fractures are identified.  IMPRESSION: 1.  No acute intracranial abnormalities. 2.  Normal appearance of the brain.   Original Report Authenticated By: Vinnie Langton, M.D.    Dg Chest Portable 1 View  08/12/2012  *RADIOLOGY REPORT*  Clinical Data: Drug overdose.  Endotracheal tube placement.  PORTABLE CHEST - 1 VIEW  Comparison: 02/20/2011  Findings: Endotracheal tube 3.0 cm above carina.  Nasogastric tube with side port above the gastroesophageal junction.  Tip likely extends beyond the inferior aspect of the film.  Normal heart size.  No pleural effusion or pneumothorax.  Low lung volumes with resultant pulmonary interstitial prominence.  Mild right infrahilar volume loss/atelectasis.  IMPRESSION: Endotracheal tube appropriately position.  Nasogastric tube with side port above the gastroesophageal junction.  Consider advancement.  Low lung volumes without acute disease.  Probable volume loss in the right infrahilar region.  Consider radiographic follow-up to exclude less likely aspiration.   Original Report Authenticated By: Abigail Miyamoto, M.D.      1. Respiratory failure   2. Acute  encephalopathy   3. Diabetes mellitus out of control   4. Type II or unspecified type diabetes mellitus with neurological manifestations, not stated as uncontrolled(250.60)     MDM   [name] is a 57 y.o. female who was brought in to the ED unresponsive.  Initial concern for drug overdose as patient found with hypodermic needles near her and medications.  Patient with GCS of 3 on arrival and normal FSBS.  No response to two repeated doses of narcan.  Intubation done for airway protection shortly after arrival.  Standard AMS/tox workup performed.  Glucose returned less than 20.  D50 given.  Head CT negative.  ETT in adequate placement.  After paralytic wore off, patient starting to move extremities.  Critical care consulted for admission.  Patient admitted.  No need for antibiotics prior to transfer.  Patient stable for transfer to MICU.  Family updated and aware.       Rogelia Mire, MD 08/12/12 [place], MD 08/12/12 1554

## 2012-08-12 NOTE — Progress Notes (Signed)
Utilization review completed.  P.J. Edrick Whitehorn,RN,BSN Case Manager [phone]  

## 2012-08-12 NOTE — ED Notes (Signed)
Results of chem 8 shown to MD: Na   149 K   3.6 Cl   111 iCa   1.17 TC02    27 Glu    <20 BUN   33 Crea   0.8 Hct   34% Hb   11.6 AnGap     15

## 2012-08-12 NOTE — H&P (Addendum)
PCCM NOTE  Date of admission: 2/27 Pt Profile: 2/27  BRIEF DESCRIPTION: 89 F with DM, htn, smoker found unresponsive. Intubated in ED after 2 doses of naloxone failed to reverse. Found to be severely hypoglycemic. Presumed dx is insulin OD with hypoglycemic encephalopathy. Also severe hypertension in ED. UDS positive only for THC   COURSE/STUDIES/EVENTS: 2/27 Admit with AMS/intubated 2/27 CT head:    LINES/TUBES: ETT 2/27 >>   MICRO:  None  ABX:  None  PROPHYLAXIS: DVT: SCDs SUP: H2RB  CONSULTANTS:    HPI: 40 yobf found by household members unresponsive. EMS dispatched and transported to [facility] ED where she received 2 dose of naloxone   Past Medical History  Diagnosis Date  . DM (diabetes mellitus), type 2   . Hypercholesteremia   . HTN (hypertension)   . Trigger finger     r index inj 12/08, bilateral thumb inj 11/09, r thumb and dequervain's inj 01/2009  . Bipolar 2 disorder   . Marijuana abuse   . Anxiety   . Arthritis   . Depression   . GERD (gastroesophageal reflux disease)     MEDICATIONS: home meds reviewed  History   Social History  . Marital Status: Married    Spouse Name: N/A    Number of Children: N/A  . Years of Education: N/A   Occupational History  . CNA     full time at [employer] Topics  . Smoking status: Current Every Day Smoker -- 0.50 packs/day for 30 years    Types: Cigarettes  . Smokeless tobacco: Never Used  . Alcohol Use: No  . Drug Use: No  . Sexually Active: Yes -- Female partner(s)   Other Topics Concern  . Not on file   Social History Narrative   Finances are a major issue. Meeting with [place]. Rejected by healthcare sharing initiative   currently receiving med assistance for lantus through our office and lyrica.      Lives with 5 grandkids      01/2011: recetntly loss position as CNA secondary to poor functionality from recurrent adhesive capsulitis, duquervains, and  hypeglycemia. Pt in process of obtaining disability.     Family History  Problem Relation Age of Onset  . Diabetes Mother   . Heart disease Mother   . Heart attack Father   . Bipolar disorder Father   . Bipolar disorder Sister   . Colon cancer Neg Hx   . Esophageal cancer Neg Hx   . Bipolar disorder Brother     ROS N/A due to altered LOC  Filed Vitals:   08/12/12 1303 08/12/12 1306 08/12/12 1322 08/12/12 1330  BP: 191/96 173/88 232/103 224/99  Pulse: 97 91 94 93  Resp: 23 23 20 20   Height:      SpO2: 100% 100% 100% 100%    EXAM:  Gen: minimally responsive HEENT: [place]/AT, EOMI, PERRL Neck: no JVD Lungs: clear anteriorly Cardiovascular: RRR s M Abdomen: soft, NT, NABS Ext: no edema, warm Neuro: RASS -4, MAEs weakly  DATA:  UDS: positive only for THC   BMET Glu < 20 on iSTAT  CBC    Component Value Date/Time   WBC 13.0* 08/12/2012 1303   RBC 3.55* 08/12/2012 1303   HGB 11.1* 08/12/2012 1303   HCT 32.4* 08/12/2012 1303   PLT 342 08/12/2012 1303   MCV 91.3 08/12/2012 1303   MCH 31.3 08/12/2012 1303   MCHC 34.3 08/12/2012 1303   RDW  15.0 08/12/2012 1303   LYMPHSABS PENDING 08/12/2012 1303   MONOABS PENDING 08/12/2012 1303   EOSABS PENDING 08/12/2012 1303   BASOSABS PENDING 08/12/2012 1303       IMPRESSION:   Principal Problem:   Acute hypoglycemic encephalopathy Active Problems:   Respiratory failure   Hypertension   Hypoglycemia due to insulin   Diabetes mellitus out of control   DIABETIC PERIPHERAL NEUROPATHY   HYPERLIPIDEMIA   BIPOLAR II DISORDER   TOBACCO USE   DEPRESSIVE DISORDER, NOS   Gastroparesis   FIBROMYALGIA   HYPERTENSION, BENIGN ESSENTIAL   Abnormal colonoscopy    ASSESSMENT / PLAN:  PULMONARY A: VDRF due to AMS  P:   Vent settings established DVTp and SUP ordered Daily WUA/SBT as indicated  CARDIOVASCULAR A:  Severe hypertension P:  PRN hydralazine to maintain SBP < 170 mmHg  RENAL A:   No issues P:   Maintenance  IVFs ordered  GASTROINTESTINAL A:   No issues  P:   NPO for now SUP ordered  HEMATOLOGIC A:   Mild anemia - suspect chronic. No evidence of active bleeding. No indication for PRBCs P:  Monitor  INFECTIOUS A:   No issues P:   Monitor  ENDOCRINE A:   DM 2 Severe hypoglycemia - suspect unintentional insulin OD  P:   Monitor CBGs closely Dextrose in IVFs PRN D50 amps Sensitive scale SSI   NEUROLOGIC A:   Severe hypoglycemic encephalopathy P:   Minimize sedation Extubate when sufficiently responsive and able to protect airway F/u CT head 2/27  TODAY'S SUMMARY:   I have personally obtained a history, examined the patient, evaluated laboratory and imaging results, formulated the assessment and plan and placed orders. CRITICAL CARE: The patient is critically ill with multiple organ systems failure and requires high complexity decision making for assessment and support, frequent evaluation and titration of therapies, application of advanced monitoring technologies and extensive interpretation of multiple databases. Critical Care Time devoted to patient care services described in this note is 40 minutes.    Wilhelmina Mcardle, MD Pulmonary and [place] Pager: [phone]  08/12/2012, 2:21 PM

## 2012-08-12 NOTE — ED Notes (Signed)
Checked patient cbg it was 11 notified RN Traci of blood sugar

## 2012-08-12 NOTE — ED Notes (Signed)
Arrives via EMS from home for apparent OD, found with needle and pt was unresponsive upon EMS arrival, VSS, 2mg  narcan given with mild improvement, arrives with nasal airway and unresponsive, CBG 102

## 2012-08-12 NOTE — Progress Notes (Signed)
Responded to staff request to provide presence and support to husband in waiting in consultation room B.  Upon introducing myself Pt. husband became afraid, aggressively verbal and combative in fear that I was sent instead of a Doctor to talk to him about his wife.  I attempted to explain my role and reason for being there.  But he insisted that he did not want to talk to anyone but the Doctor.  When Doctors arrived his behavior was the same. After continuous support he calmed down and visited with his wife.  At end of my shift  I left family  In consultation room and advised pt. Nurse that if Chaplain services were needed further please call.  Will follow as needed.

## 2012-08-12 NOTE — ED Notes (Signed)
Returned from CT.

## 2012-08-12 NOTE — Progress Notes (Signed)
K 2.9. Discussed with RN GW, ordered STAT K level per recommendation for follow up. Pt receiving K in IV fluids. See MAR.

## 2012-08-12 NOTE — ED Provider Notes (Signed)
I saw and evaluated the patient, reviewed the resident's note and I agree with the findings and plan. I agree with resident's EKG interpretation.   Found down at home with "needle in arm". CBG 102, minimal response to narcan, cool extremities. Does not respond to pain or follow commands. Intubated on arrival for failure to protect airway. Repeat sugar <20.  D50 given.  Possible insulin OD. Family denies drug use. Severe hypertension. CT head negative. D5 gtt started. Frequent CBG checks.  Very anterior airway with ultimate intubation by myself after two resident attempts. No vomiting or aspiration during attempts.  CRITICAL CARE Performed by: Ezequiel Essex   Total critical care time: 30  Critical care time was exclusive of separately billable procedures and treating other patients.  Critical care was necessary to treat or prevent imminent or life-threatening deterioration.  Critical care was time spent personally by me on the following activities: development of treatment plan with patient and/or surrogate as well as nursing, discussions with consultants, evaluation of patient's response to treatment, examination of patient, obtaining history from patient or surrogate, ordering and performing treatments and interventions, ordering and review of laboratory studies, ordering and review of radiographic studies, pulse oximetry and re-evaluation of patient's condition.   Ezequiel Essex, MD 08/12/12 [phone]

## 2012-08-12 NOTE — ED Notes (Signed)
Pt to CT with RN, RT and on CR monitor

## 2012-08-12 NOTE — ED Notes (Signed)
Results of troponin called to MD:   0.01

## 2012-08-12 NOTE — ED Notes (Signed)
Checked patient [name] again it was 171 notified Dr. Wyvonnia Dusky of blood sugar

## 2012-08-12 NOTE — ED Notes (Signed)
Resident and EDP to intubate with RN and RT at bedside

## 2012-08-12 NOTE — ED Notes (Signed)
Results of Lactic acid shown to MD:     2.70

## 2012-08-12 NOTE — Care Management Note (Signed)
    Page 1 of 1   08/12/2012     4:30:09 PM   CARE MANAGEMENT NOTE 08/12/2012  Patient:  [name], [name]   Account Number:  [id number]  Date Initiated:  08/12/2012  Documentation initiated by:  Elissa Hefty  Subjective/Objective Assessment:   hypoglycemic enceph     Action/Plan:   pcp dr Luetta Nutting, lives w spouse   Anticipated DC Date:     Anticipated DC Plan:        DC Planning Services  CM consult      Choice offered to / List presented to:             Status of service:   Medicare Important Message given?   (If response is "NO", the following Medicare IM given date fields will be blank) Date Medicare IM given:   Date Additional Medicare IM given:    Discharge Disposition:    Per UR Regulation:  Reviewed for med. necessity/level of care/duration of stay  If discussed at [place] of Stay Meetings, dates discussed:    Comments:

## 2012-08-13 ENCOUNTER — Inpatient Hospital Stay (HOSPITAL_COMMUNITY): Payer: Medicaid Other

## 2012-08-13 DIAGNOSIS — Z5189 Encounter for other specified aftercare: Secondary | ICD-10-CM

## 2012-08-13 LAB — CBC
Hemoglobin: 9.2 g/dL — ABNORMAL LOW (ref 12.0–15.0)
MCH: 30.3 pg (ref 26.0–34.0)
MCV: 90.8 fL (ref 78.0–100.0)
RBC: 3.04 MIL/uL — ABNORMAL LOW (ref 3.87–5.11)

## 2012-08-13 LAB — BASIC METABOLIC PANEL
CO2: 20 mEq/L (ref 19–32)
CO2: 25 mEq/L (ref 19–32)
Calcium: 7.4 mg/dL — ABNORMAL LOW (ref 8.4–10.5)
Chloride: 106 mEq/L (ref 96–112)
Creatinine, Ser: 0.78 mg/dL (ref 0.50–1.10)
Creatinine, Ser: 0.87 mg/dL (ref 0.50–1.10)
Glucose, Bld: 672 mg/dL (ref 70–99)
Glucose, Bld: 197 mg/dL — ABNORMAL HIGH (ref 70–99)
Sodium: 131 mEq/L — ABNORMAL LOW (ref 135–145)
Sodium: 141 mEq/L (ref 135–145)

## 2012-08-13 LAB — PATHOLOGIST SMEAR REVIEW

## 2012-08-13 LAB — GLUCOSE, CAPILLARY
Glucose-Capillary: 176 mg/dL — ABNORMAL HIGH (ref 70–99)
Glucose-Capillary: 183 mg/dL — ABNORMAL HIGH (ref 70–99)
Glucose-Capillary: 191 mg/dL — ABNORMAL HIGH (ref 70–99)
Glucose-Capillary: 226 mg/dL — ABNORMAL HIGH (ref 70–99)

## 2012-08-13 MED ORDER — PNEUMOCOCCAL VAC POLYVALENT 25 MCG/0.5ML IJ INJ
0.5000 mL | INJECTION | INTRAMUSCULAR | Status: AC
  Administered 2012-08-14: 0.5 mL via INTRAMUSCULAR
  Filled 2012-08-13: qty 0.5

## 2012-08-13 MED ORDER — PHENOL 1.4 % MT LIQD: 1.0000 | OROMUCOSAL | Status: DC | PRN

## 2012-08-13 MED ORDER — ACETAMINOPHEN 325 MG PO TABS
650.0000 mg | ORAL_TABLET | Freq: Once | ORAL | Status: AC
  Administered 2012-08-13: 650 mg via ORAL
  Filled 2012-08-13: qty 2

## 2012-08-13 MED ORDER — ACETAMINOPHEN 325 MG PO TABS
650.0000 mg | ORAL_TABLET | Freq: Four times a day (QID) | ORAL | Status: DC | PRN
  Administered 2012-08-13: 650 mg via ORAL
  Filled 2012-08-13: qty 2

## 2012-08-13 MED ORDER — QUETIAPINE FUMARATE 50 MG PO TABS
50.0000 mg | ORAL_TABLET | Freq: Every day | ORAL | Status: DC
  Administered 2012-08-13 – 2012-08-15 (×3): 50 mg via ORAL
  Filled 2012-08-13 (×4): qty 1

## 2012-08-13 MED ORDER — SODIUM POLYSTYRENE SULFONATE 15 GM/60ML PO SUSP
30.0000 g | Freq: Once | ORAL | Status: DC
  Filled 2012-08-13: qty 120

## 2012-08-13 MED ORDER — LABETALOL HCL 5 MG/ML IV SOLN
10.0000 mg | INTRAVENOUS | Status: DC | PRN
  Filled 2012-08-13: qty 4

## 2012-08-13 MED ORDER — SODIUM CHLORIDE 0.45 % IV SOLN
INTRAVENOUS | Status: DC
  Administered 2012-08-13 (×2): via INTRAVENOUS

## 2012-08-13 MED ORDER — SODIUM POLYSTYRENE SULFONATE 15 GM/60ML PO SUSP
15.0000 g | Freq: Once | ORAL | Status: DC
  Filled 2012-08-13: qty 60

## 2012-08-13 NOTE — Progress Notes (Signed)
Hyperglycemia and hyperkalemia   D5/KCL infusion stopped; Kayexalate 15 g ordered

## 2012-08-13 NOTE — Progress Notes (Signed)
Pt HR elevated in 140s. Pt asymptomatic, resting in bed, skin w&d, no cx pain/discomfort/sob. MD paged at [phone], will continue to monitor.

## 2012-08-13 NOTE — Progress Notes (Signed)
PCCM NOTE  Date of admission: 2/27 Pt Profile: 2/27  BRIEF DESCRIPTION: 57 F with DM, htn, smoker found unresponsive. Intubated in ED after 2 doses of naloxone failed to reverse. Found to be severely hypoglycemic. Presumed dx is insulin OD with hypoglycemic encephalopathy. Also severe hypertension in ED. UDS positive only for THC   LINES/TUBES: ETT 2/27 >>    MICRO:  UCx 2/27 >> Results for orders placed during the hospital encounter of 08/12/12  MRSA PCR SCREENING     Status: Abnormal   Collection Time    08/12/12  4:23 PM      Result Value Range Status   MRSA by PCR INVALID RESULTS, SPECIMEN SENT FOR CULTURE (*) NEGATIVE Final   Comment:            The GeneXpert MRSA Assay (FDA     approved for NASAL specimens     only), is one component of a     comprehensive MRSA colonization     surveillance program. It is not     intended to diagnose MRSA     infection nor to guide or     monitor treatment for     MRSA infections.     NOTIFIED KELLY RN 2130 08/12/12 A BROWNING     ABX:  None Anti-infectives   None       PROPHYLAXIS: DVT: SCDs SUP: H2RB   COURSE/STUDIES/EVENTS: 2/27 Admit with AMS/intubated 2/27 CT head: normal   SUBJECTIVE: Awake and alert this morning.  Communicates via writing.  Reports that she took Novolog 50 units yesterday at 9am after a CBG was 399.  She also reports taking "regular Novolin" at 8am.  Some throat pain.   WUA/SBT: passed Extubated successfully     Filed Vitals:   08/13/12 0400 08/13/12 0500 08/13/12 0600 08/13/12 0700  BP: 118/53 109/56 105/47 116/51  Pulse: 101 105 104 102  Temp:      TempSrc:      Resp: 20 23 20 21   Height:      SpO2: 100% 100% 100% 100%    EXAM:  Gen: awake, alert, cooperative HEENT: [place]/AT, ETT in place Lungs: clear anteriorly Cardiovascular: RRR, no murmurs Abdomen: soft, NT, NABS Ext: no edema, warm Neuro: alert, oriented, moving all extremities, cam-icu negative  BMET    Component  Value Date/Time   NA 141 08/13/2012 0835   K 3.6 08/13/2012 0835   CL 106 08/13/2012 0835   CO2 25 08/13/2012 0835   GLUCOSE 197* 08/13/2012 0835   BUN 20 08/13/2012 0835   CREATININE 0.87 08/13/2012 0835   CREATININE 0.89 11/18/2011 1619   CALCIUM 9.1 08/13/2012 0835   GFRNONAA 73* 08/13/2012 0835   GFRAA 84* 08/13/2012 0835   CBC    Component Value Date/Time   WBC 12.8* 08/13/2012 0420   RBC 3.04* 08/13/2012 0420   HGB 9.2* 08/13/2012 0420   HCT 27.6* 08/13/2012 0420   PLT 275 08/13/2012 0420   MCV 90.8 08/13/2012 0420   MCH 30.3 08/13/2012 0420   MCHC 33.3 08/13/2012 0420   RDW 15.3 08/13/2012 [id number]   LYMPHSABS DUPLICATE REQUEST [id number]   MONOABS DUPLICATE REQUEST [id number]   EOSABS DUPLICATE REQUEST [id number]   BASOSABS DUPLICATE REQUEST [id number]    CBG (last 3)   Recent Labs  08/13/12 0013 08/13/12 0353 08/13/12 0625  GLUCAP 226* 225* 176*   CXR: tube in good position, no airspace disease   IMPRESSION:   Principal Problem:   Acute  hypoglycemic encephalopathy Active Problems:   Diabetes mellitus out of control   DIABETIC PERIPHERAL NEUROPATHY   HYPERLIPIDEMIA   BIPOLAR II DISORDER   TOBACCO USE   DEPRESSIVE DISORDER, NOS   Gastroparesis   FIBROMYALGIA   HYPERTENSION, BENIGN ESSENTIAL   Abnormal colonoscopy   Respiratory failure   Hypertension   Hypoglycemia due to insulin    ASSESSMENT / PLAN:  PULMONARY A: VDRF due to AMS due to low sugar  P:   DVTp and SUP ordered Daily WUA/SBT as indicated Extubated today  CARDIOVASCULAR A:  Severe hypertension >> resolved P:  PRN hydralazine to maintain SBP < 170 mmHg Add back home meds as appropriate  RENAL A:   No issues Hyperkalemia P:   kvo Doubt accuracy of am bmet given discrepancy between glucose and CBG Will repeat BMP now, if K still elevated, will give kayexelate   GASTROINTESTINAL A:   No issues  P:   NPO for now SUP ordered Start diabetic diet if successfully  extubated  HEMATOLOGIC A:   Mild anemia - suspect chronic. No evidence of active bleeding. No indication for PRBCs P:  Monitor  INFECTIOUS A:   No issues P:   Monitor  ENDOCRINE A:   DM 2 Severe hypoglycemia - unintentional insulin OD  P:   Monitor CBGs closely kvo PRN D50 amps Sensitive scale SSI Diabetic education   NEUROLOGIC A:   Severe hypoglycemic encephalopathy >> resolved P:   Minimize sedation Extubated today  If does well this morning, will transfer to floor this afternoon and FPTS.  FPTS aware of the patient.   BOOTH, [place], PGY-2 08/13/2012, 7:42 AM   STAFF NOTE: I, Dr Ann Lions have personally reviewed patient's available data, including medical history, events of note, physical examination and test results as part of my evaluation. I have discussed with resident/NP and other care providers such as pharmacist, RN and RRT.  In addition,  I personally evaluated patient and elicited key findings of acute respiratory failure on the basis of altered status do to hypoglycemia which itself was do to inadvertent excess dosage off short acting insulin due to poor knowledge base. She is well now. Hyperkalemia has been deemed spurious. We have extubated her this morning and this afternoon she is doing well and we will transfer her to the floor with family practice teaching service to see him primary service tomorrow.  Rest per NP/medical resident whose note is outlined above and that I agree with    Dr. Brand Males, M.D., [facility]C.P Pulmonary and Critical Care Medicine Staff Physician [place] [facility] Pager: [phone], If no answer or between  15:00h - 7:00h: call [phone]  08/13/2012 2:35 PM

## 2012-08-13 NOTE — Procedures (Signed)
Extubation Procedure Note  Patient Details:   Name: [name] DOB: 1955/10/08 MRN: [id number]   Airway Documentation:     Evaluation  O2 sats: stable throughout Complications: No apparent complications Patient did tolerate procedure well. Bilateral Breath Sounds: Clear;Diminished Suctioning: Airway Yes  Patient's mouth cleaned by RN and suctioned. Pt extubated and placed on 2LNC. HR-114 RR-22 100% . Clear bilateral breath sounds, no stridor.  Leida Lauth 08/13/2012, 8:34 AM

## 2012-08-13 NOTE — Progress Notes (Signed)
Report called to [facility].  Transported in wheelchair to [phone].  VSS. No distress noted.

## 2012-08-13 NOTE — Progress Notes (Signed)
Pt's son was escorted into the unit with security from ED. Pt's son stated he had "been out drinking all night." He was very loud and cussing. He was asked by multiple staff members to be quiet and let his mother rest. He then started pacing around the room and saying "You better be ok mom or I will come back and kill all these people and the doctor." Security was called to escort pt's son out. [employer] was called by security to 2100 from ED and escorted pt's son out.

## 2012-08-13 NOTE — Progress Notes (Signed)
Patient placed on cpap/ps 5/5. Patient tolerating well at this time

## 2012-08-13 NOTE — Progress Notes (Signed)
[place] Progress Note Patient Name: [name] DOB: [birth year] MRN: [id number]  Date of Service  08/13/2012   HPI/Events of Note  Call from nurse reporting increase in HR to the 140s.  Patient is asymptomatic.  HR trend up has been gradually increaseing.  Patient is on prn hydralazine which can have the side effect of tachycardia.   eICU Interventions  Plan: For now monitor Will change out hydralazine for labetalol for BP control since side effect of this antihypertensive is bradycardia.   Intervention Category Intermediate Interventions: Arrhythmia - evaluation and management  [name] 08/13/2012, 9:32 PM

## 2012-08-13 NOTE — Progress Notes (Addendum)
Inpatient Diabetes Program Recommendations  AACE/ADA: New Consensus Statement on Inpatient Glycemic Control (2013)  Target Ranges:  Prepandial:   less than 140 mg/dL      Peak postprandial:   less than 180 mg/dL (1-2 hours)      Critically ill patients:  140 - 180 mg/dL   Received referral for this patient.  Patient admitted with severe hypoglycemia.  MD questioning if patient accidentally overdosed on her insulin.  Spoke with patient about her diabetes regimen at home.  Patient stated she took 25 units of NPH insulin at ~8:30pm on Wednesday night.  Her CBG at 10pm that same night was 399 mg/dl.  Patient states she took 10 units of Novolog and then ate some food for her supper.  Patient states she normally eats late at home but she also told me she has trouble with her appetite and does not eat much throughout any given day.  According to patient's spouse, patient became unresponsive shortly after she took her Novolog and EMS was called.  Patient told me she uses the following insulin regimen at home: -Checks her CBGs at 10am, 4pm, and 9pm -Takes 27 units of NPH at 9am and 25 units of NPH at 9pm -Will take Novolog insulin only if her CBGs are >200 mg/dl.  If her CBG is >200 mg/dl, she will take 8 units Novolog.  If her CBG is >300 mg/dl she will take 10 units Novolog.   Patient is followed by Dr. Luetta Nutting at the [facility].  Her NPH insulin was recently adjusted on 05/16/12.  Patient stated she is frustrated by her CBGs and does not want to take her Novolog anymore.  Patient told me she is able to recognize s/sxs of hypoglycemia and does know how to treat.  Reminded patient that 4 oz. Of juice or regular soda is the ideal treatment at home.  I am unsure why patient takes such large doses of Novolog at home when her appetite is so poor (per patient report).  Perhaps patient would do better with a simple correction scale at home tid around meal times instead of set large doses of  Novolog. If she took smaller doses of Novolog before her CBGs reach the 200 and 300s, she would not need to take a large dose of Novolog that potentially sets her up for hypoglycemia.  Noted patient currently only getting Novolog Sensitive correction scale Q4 hours at present.  CBGs stable so far:  Results for KADESHIA, THOMURE (MRN [id number]) as of 08/13/2012 14:19  Ref. Range 08/13/2012 00:13 08/13/2012 03:53 08/13/2012 06:25 08/13/2012 07:39 08/13/2012 11:23  Glucose-Capillary Latest Range: 70-99 mg/dL 226 (H) 225 (H) 176 (H) 191 (H) 117 (H)   Called patient's PCP at the [place].  Made follow-up appointment for Ms. Carcione on 08/20/12 at 1:30pm with Dr. Luetta Nutting.  This appointment was placed on patient's AVS.   Will follow. Wyn Quaker RN, MSN, CDE Diabetes Coordinator Inpatient Diabetes Program [phone]

## 2012-08-13 NOTE — Progress Notes (Signed)
Patient arrived to room [phone]. Patient comfortable but complaining of mild headache. Patient placed on tele and will continue to be monitored.

## 2012-08-13 NOTE — Progress Notes (Signed)
CRITICAL VALUE ALERT  Critical value received:  Glucose 672  Date of notification:  08/13/2012  Time of notification:  [id number]  Critical value read back:yes  Nurse who received alert:  Elliot Gurney  MD notified (1st page):  Dr. Darrick Grinder  Time of first page:  0545  MD notified (2nd page):  Time of second page:  Responding MD:  Dr. Darrick Grinder  Time MD responded:  [phone]    Also notified MD of pt's K of 6.0.

## 2012-08-14 DIAGNOSIS — F3189 Other bipolar disorder: Secondary | ICD-10-CM

## 2012-08-14 DIAGNOSIS — I10 Essential (primary) hypertension: Secondary | ICD-10-CM

## 2012-08-14 DIAGNOSIS — IMO0001 Reserved for inherently not codable concepts without codable children: Secondary | ICD-10-CM

## 2012-08-14 LAB — URINE CULTURE
Colony Count: NO GROWTH
Special Requests: NORMAL

## 2012-08-14 LAB — GLUCOSE, CAPILLARY
Glucose-Capillary: 144 mg/dL — ABNORMAL HIGH (ref 70–99)
Glucose-Capillary: 166 mg/dL — ABNORMAL HIGH (ref 70–99)
Glucose-Capillary: 239 mg/dL — ABNORMAL HIGH (ref 70–99)
Glucose-Capillary: 93 mg/dL (ref 70–99)

## 2012-08-14 MED ORDER — HEPARIN SODIUM (PORCINE) 5000 UNIT/ML IJ SOLN
5000.0000 [IU] | Freq: Three times a day (TID) | INTRAMUSCULAR | Status: DC
  Administered 2012-08-14 – 2012-08-16 (×6): 5000 [IU] via SUBCUTANEOUS
  Filled 2012-08-14 (×9): qty 1

## 2012-08-14 MED ORDER — METOPROLOL TARTRATE 25 MG PO TABS
25.0000 mg | ORAL_TABLET | Freq: Two times a day (BID) | ORAL | Status: DC
  Administered 2012-08-14 (×2): 25 mg via ORAL
  Filled 2012-08-14 (×4): qty 1

## 2012-08-14 NOTE — Progress Notes (Signed)
[facility] Progress Note  Patient name: [name] Medical record number: [id number] Date of birth: 1956-04-05 Age: 57 y.o. Gender: female    LOS: 2 days   Primary Care Provider: MATTHEWS,CODY, DO  Overnight Events:  NAEO. OOB to chair. Still feels " fuzzy."   Objective: Vital signs in last 24 hours: Temp:  [98.5 F (36.9 C)-99.4 F (37.4 C)] 98.5 F (36.9 C) (03/01 0415) Pulse Rate:  [107-123] 109 (03/01 0415) Resp:  [18-23] 18 (03/01 0415) BP: (124-154)/(52-102) 124/72 mmHg (03/01 0415) SpO2:  [97 %-100 %] 97 % (03/01 0415)  Wt Readings from Last 3 Encounters:  07/08/12 153 lb (69.4 kg)  06/14/12 149 lb (67.586 kg)  05/11/12 150 lb 2 oz (68.096 kg)     Current Facility-Administered Medications  Medication Dose Route Frequency Provider Last Rate Last Dose  . 0.45 % sodium chloride infusion   Intravenous Continuous Sibyl Parr, MD 20 mL/hr at 08/13/12 1725    . insulin aspart (novoLOG) injection 0-9 Units  0-9 Units Subcutaneous Q4H Wilhelmina Mcardle, MD   2 Units at 08/14/12 [phone]  . labetalol (NORMODYNE,TRANDATE) injection 10 mg  10 mg Intravenous Q2H PRN Colbert Coyer, MD      . pneumococcal 23 valent vaccine (PNU-IMMUNE) injection 0.5 mL  0.5 mL Intramuscular Tomorrow-1000 Brand Males, MD      . QUEtiapine (SEROQUEL) tablet 50 mg  50 mg Oral QHS Rush Farmer, MD   50 mg at 08/13/12 2146     PE: BP 131/65  Pulse 100  Temp(Src) 98.4 F (36.9 C) (Oral)  Resp 18  Ht 5\' 4"  (1.626 m)  SpO2 98%  Gen: NAD, sleepy,  HEENT: mmm, EOMI CV: RRR, no m/r/g Res: CTAB, nml effort Abd: soft non-tender Ext/Musc:2+ pulses, warm, well perfused Neuro: CN grossly intact.   Labs/Studies:  Results for orders placed during the hospital encounter of 08/12/12 (from the past 24 hour(s))  GLUCOSE, CAPILLARY     Status: Abnormal   Collection Time    08/13/12  4:23 PM      Result Value Range   Glucose-Capillary 183 (*) 70 - 99 mg/dL   GLUCOSE, CAPILLARY     Status: Abnormal   Collection Time    08/13/12  9:42 PM      Result Value Range   Glucose-Capillary 235 (*) 70 - 99 mg/dL  GLUCOSE, CAPILLARY     Status: Abnormal   Collection Time    08/14/12 12:41 AM      Result Value Range   Glucose-Capillary 207 (*) 70 - 99 mg/dL  GLUCOSE, CAPILLARY     Status: Abnormal   Collection Time    08/14/12  4:02 AM      Result Value Range   Glucose-Capillary 144 (*) 70 - 99 mg/dL  GLUCOSE, CAPILLARY     Status: Abnormal   Collection Time    08/14/12  8:14 AM      Result Value Range   Glucose-Capillary 166 (*) 70 - 99 mg/dL   Comment 1 Notify RN     Comment 2 Documented in Chart        Assessment/Plan: 57yo AAF w/ h/o DM, HLD, HTN, Bipolar, GERD, Drug abuse who was admitted to the ICU on 08/12/12 after being found in hypoglycemic encephalopathy. No extubated and transferred to PCP care on 08/14/12.   1. DM: No further hypoglycemic events after likely unintentional insulin OD. BS well maintained on SSI. Allow for some hyperglycemia -  continue SSI - continues DM education - Continue BS monitoring but change to Q8hrs  2. CV: hypotension/HTN and tachycardic. Likely behind on fluids.  - Change to home Metop 25BID  3. Neuro/Psych: Pt aware of why she is in hospital. Sleepy but mentating clearly - Continue Seroquel - continue to hold home neurontin, and Abilify, and Remeron. Add back as more awake  4. FEN/GI: Continue Carb mod diet.  - Add back Reglan once taking PO  5. PPX: - Hep [place] TID  6. Dispo: penidng clinical improvement  LOS 2  Signed: Linna Darner, MD Family Medicine Resident PGY-2 [phone] 08/14/2012 8:50 AM

## 2012-08-14 NOTE — Progress Notes (Signed)
Family Medicine Teaching Service Attending Note  I interviewed and examined patient [name] and reviewed their tests and x-rays.  I discussed with Dr. Marily Memos and reviewed their note for today.  I agree with their assessment and plan.     Additionally  Feels well except for sore throat  Will need to carefully review insulin/dm regimen before discharge Wonder if she could be managed without intermittent insulin on orals and just long acting low dose insulin to avoid these repeated very severe events

## 2012-08-15 ENCOUNTER — Encounter (HOSPITAL_COMMUNITY): Payer: Self-pay | Admitting: General Practice

## 2012-08-15 LAB — MRSA CULTURE

## 2012-08-15 LAB — GLUCOSE, CAPILLARY
Glucose-Capillary: 125 mg/dL — ABNORMAL HIGH (ref 70–99)
Glucose-Capillary: 139 mg/dL — ABNORMAL HIGH (ref 70–99)
Glucose-Capillary: 209 mg/dL — ABNORMAL HIGH (ref 70–99)

## 2012-08-15 MED ORDER — INSULIN ASPART 100 UNIT/ML ~~LOC~~ SOLN
0.0000 [IU] | Freq: Three times a day (TID) | SUBCUTANEOUS | Status: DC
  Administered 2012-08-15: 3 [IU] via SUBCUTANEOUS
  Administered 2012-08-16: 5 [IU] via SUBCUTANEOUS
  Administered 2012-08-16: 3 [IU] via SUBCUTANEOUS

## 2012-08-15 MED ORDER — LISINOPRIL 40 MG PO TABS
40.0000 mg | ORAL_TABLET | Freq: Every day | ORAL | Status: DC
  Administered 2012-08-15 – 2012-08-16 (×2): 40 mg via ORAL
  Filled 2012-08-15 (×2): qty 1

## 2012-08-15 MED ORDER — METFORMIN HCL ER 500 MG PO TB24
500.0000 mg | ORAL_TABLET | Freq: Every day | ORAL | Status: DC
  Administered 2012-08-15 – 2012-08-16 (×2): 500 mg via ORAL
  Filled 2012-08-15 (×3): qty 1

## 2012-08-15 MED ORDER — HYDROCHLOROTHIAZIDE 25 MG PO TABS
25.0000 mg | ORAL_TABLET | Freq: Every day | ORAL | Status: DC
  Administered 2012-08-15 – 2012-08-16 (×2): 25 mg via ORAL
  Filled 2012-08-15 (×2): qty 1

## 2012-08-15 MED ORDER — METFORMIN HCL 500 MG PO TABS
500.0000 mg | ORAL_TABLET | Freq: Two times a day (BID) | ORAL | Status: DC
  Filled 2012-08-15 (×2): qty 1

## 2012-08-15 NOTE — Progress Notes (Signed)
Family Medicine Teaching Service Daily Progress Note Service Page: [phone]  Subjective:  Feeling well this am but continues to report Sore throat. No hypoglycemic events overnight.  Objective: Temp:  [98.4 F (36.9 C)-99.2 F (37.3 C)] 98.9 F (37.2 C) (03/02 0406) Pulse Rate:  [95-116] 95 (03/02 0406) Resp:  [18] 18 (03/02 0406) BP: (117-178)/(54-80) 117/58 mmHg (03/02 0406) SpO2:  [96 %-99 %] 99 % (03/02 0406) Weight:  [158 lb 6.4 oz (71.85 kg)] 158 lb 6.4 oz (71.85 kg) (03/01 2005)  Exam: General: sitting up in chair.  NAD. Cardiovascular: RRR. No murmurs, rubs, or gallops. Respiratory: CTAB. No rales, rhonchi, or wheeze. Abdomen: soft, nontender, nondistended. Extremities: warm well perfused. No edema noted. Psych: flat affect noted.  CBC BMET   Recent Labs Lab 08/12/12 1303 08/12/12 1336 08/12/12 1445 08/13/12 0420  WBC 13.0*  --  9.1 12.8*  HGB 11.1* 11.6* 12.1 9.2*  HCT 32.4* 34.0* 36.1 27.6*  PLT 342  --  290 275    Recent Labs Lab 08/12/12 1939 08/13/12 0420 08/13/12 0835  NA 135 131* 141  K 4.5 6.0* 3.6  CL 102 103 106  CO2 21 20 25   BUN 20 19 20   CREATININE 0.60 0.78 0.87  GLUCOSE 456* 672* 197*  CALCIUM 8.1* 7.4* 9.1     Lab Results  Component Value Date   HGBA1C 9.7 06/14/2012   CBG (last 3)   Recent Labs  08/14/12 2009 08/15/12 0009 08/15/12 0408  GLUCAP 93 139* 209*   CBG (past 24 hours) - 93 - 239   Imaging/Diagnostic Tests:  Ct Head Wo Contrast 08/12/2012  IMPRESSION: 1.  No acute intracranial abnormalities. 2.  Normal appearance of the brain.   Portable Chest Xray In Am 08/13/2012  IMPRESSION: Endotracheal tube tip 3 cm above the carina.  Nasogastric tube tip only at the gastroesophageal junction.  This may need to be adjusted.  Minimal atelectasis left lower lobe.  Better aeration compared to yesterday.   Original Report Authenticated By: Nelson Chimes, M.D.    Dg Chest Portable 1 View 08/12/2012  IMPRESSION: Endotracheal  tube appropriately position.  Nasogastric tube with side port above the gastroesophageal junction.  Consider advancement.  Low lung volumes without acute disease.  Probable volume loss in the right infrahilar region.  Consider radiographic follow-up to exclude less likely aspiration.   Assessment/Plan: 58 year old female with PMH of DM, HLD, HTN, GERD, and Bipolar disorder who was admitted to the ICU on 08/12/12 after being found unresponsive.  Patient was found to be in hypoglycemic encephalopathy (CBG was less than 20).  Patient now extubated and was transferred to our service on 08/14/12.  # Acute hypoglycemic encephalopathy and Acute Respiratory failure - Resolved - Patient was intubated and hypoglycemia treated with IV fluids and PRN  - Head CT was obtained and was negative  - Patient now extubated and on FM Teaching Service - No further episodes of hypoglycemia. - Will continue CBG's - TID AC and QHS, and sensitive SSI.  Starting metformin 500 mg BID today.  # DM-2 with complications (peripheral neuropathy and gastroparesis) - Will continue sensitive SSI and CBG checks as above.  - Adding Metformin today.    # HTN  - Well controlled this am. - Will continue Metoprolol 25 mg BID. Will restart home Lisinopril/HCTZ if needed.  # Bipolar 2 disorder - Continue home Seroquel.   FEN/GI: Carb modified diet. Saline lock IV PPx: Heparin SQ Dispo: Pending clinical improvement and stabilization of  CBG's Code Status: Full code  Thersa Salt, DO 08/15/2012, 7:21 AM

## 2012-08-15 NOTE — Progress Notes (Signed)
Family Medicine Teaching Service Attending Note  I interviewed and examined patient [name] and reviewed their tests and x-rays.  I discussed with Dr. Lacinda Axon and reviewed their note for today.  I agree with their assessment and plan.     Additionally  Feeling better Start on metformin XR and hopefully she can tolerate would be very good if we could find a noninsulin regimen for her Would stop BB since could possibly obscure her notice of hypoglycemia

## 2012-08-16 LAB — BASIC METABOLIC PANEL
BUN: 8 mg/dL (ref 6–23)
CO2: 27 mEq/L (ref 19–32)
Chloride: 96 mEq/L (ref 96–112)
GFR calc non Af Amer: >90 mL/min (ref >90)
Glucose, Bld: 285 mg/dL — ABNORMAL HIGH (ref 70–99)
Potassium: 3.2 mEq/L — ABNORMAL LOW (ref 3.5–5.1)

## 2012-08-16 LAB — GLUCOSE, CAPILLARY: Glucose-Capillary: 286 mg/dL — ABNORMAL HIGH (ref 70–99)

## 2012-08-16 MED ORDER — POTASSIUM CHLORIDE CRYS ER 20 MEQ PO TBCR
40.0000 meq | EXTENDED_RELEASE_TABLET | Freq: Once | ORAL | Status: AC
  Administered 2012-08-16: 40 meq via ORAL
  Filled 2012-08-16: qty 2

## 2012-08-16 MED ORDER — METFORMIN HCL ER 500 MG PO TB24: 500.0000 mg | ORAL_TABLET | Freq: Every day | ORAL | Status: DC

## 2012-08-16 NOTE — Progress Notes (Signed)
Pt. Going home on po medicines for her blood sugar.

## 2012-08-16 NOTE — Progress Notes (Addendum)
Internal medicine MD.was page On regard diabetics coordinator recommendations,he said they do not want the pt. On latus.thanks.pt. Going home on po medicine

## 2012-08-16 NOTE — Progress Notes (Signed)
Family Medicine Teaching Service Daily Progress Note Service Page: [phone]  Subjective:  Continues to report sore throat (likely secondary to recent intubation).  Also reports abdominal discomfort and diarrhea following administration of metformin yesterday.  Objective: Temp:  [98 F (36.7 C)-98.5 F (36.9 C)] 98.5 F (36.9 C) (03/03 0432) Pulse Rate:  [78-109] 96 (03/03 0432) Resp:  [18] 18 (03/03 0432) BP: (100-145)/(55-79) 116/55 mmHg (03/03 0432) SpO2:  [98 %] 98 % (03/03 0432) Weight:  [155 lb 3.3 oz (70.4 kg)] 155 lb 3.3 oz (70.4 kg) (03/02 2010)  Exam: General: sitting up in chair.  NAD. Cardiovascular: RRR. No murmurs, rubs, or gallops. Respiratory: CTAB. No rales, rhonchi, or wheeze. Abdomen: soft, nontender, nondistended. Extremities: warm well perfused. No edema noted. Psych: flat affect noted.  CBC BMET   Recent Labs Lab 08/12/12 1303 08/12/12 1336 08/12/12 1445 08/13/12 0420  WBC 13.0*  --  9.1 12.8*  HGB 11.1* 11.6* 12.1 9.2*  HCT 32.4* 34.0* 36.1 27.6*  PLT 342  --  290 275    Recent Labs Lab 08/12/12 1939 08/13/12 0420 08/13/12 0835  NA 135 131* 141  K 4.5 6.0* 3.6  CL 102 103 106  CO2 21 20 25   BUN 20 19 20   CREATININE 0.60 0.78 0.87  GLUCOSE 456* 672* 197*  CALCIUM 8.1* 7.4* 9.1     Lab Results  Component Value Date   HGBA1C 9.7 06/14/2012   CBG (past 24 hours) - 125-296  Imaging/Diagnostic Tests:  Ct Head Wo Contrast 08/12/2012  IMPRESSION: 1.  No acute intracranial abnormalities. 2.  Normal appearance of the brain.   Portable Chest Xray In Am 08/13/2012  IMPRESSION: Endotracheal tube tip 3 cm above the carina.  Nasogastric tube tip only at the gastroesophageal junction.  This may need to be adjusted.  Minimal atelectasis left lower lobe.  Better aeration compared to yesterday.   Original Report Authenticated By: Nelson Chimes, M.D.    Dg Chest Portable 1 View 08/12/2012  IMPRESSION: Endotracheal tube appropriately position.   Nasogastric tube with side port above the gastroesophageal junction.  Consider advancement.  Low lung volumes without acute disease.  Probable volume loss in the right infrahilar region.  Consider radiographic follow-up to exclude less likely aspiration.   Assessment/Plan: 57 year old female with PMH of DM, HLD, HTN, GERD, and Bipolar disorder who was admitted to the ICU on 08/12/12 after being found unresponsive.  Patient was found to be in hypoglycemic encephalopathy (CBG was less than 20).  Patient now extubated and was transferred to our service on 08/14/12.  # Acute hypoglycemic encephalopathy and Acute Respiratory failure - Resolved. Patient was intubated and hypoglycemia treated with IV fluids and PRN D50. Head CT was obtained and was negative.  Patient extubated on 2/28 and transferred to FMTS on 3/1 - No further episodes of hypoglycemia. - Will continue CBG's - TID AC and QHS and sensitive SSI. - Patient now on metformin XR 500 mg daily. Awaiting recommendations by diabetes coordinator.  # DM-2 with complications (peripheral neuropathy and gastroparesis) - Will continue sensitive SSI and CBG checks as above.  - Will continue Metformin XR 500 mg daily. - No further hypoglycemic events and CBG's fairly controlled.   # HTN  - Well controlled this am. - Metoprolol discontinued as this may mask signs of hypoglycemia.  Will continue home HCTZ/Lisinopril.  # Bipolar 2 disorder - Continue home Seroquel.   FEN/GI: Carb modified diet. Saline lock IV PPx: Heparin SQ Dispo: Likely D/C home  today. Code Status: Full code  Thersa Salt, DO 08/16/2012, 7:12 AM

## 2012-08-16 NOTE — Progress Notes (Addendum)
D/C orders were given,tele was d/c.IV was removed.pt. Ready to go home

## 2012-08-16 NOTE — Discharge Summary (Signed)
[facility] Discharge Summary  Patient name: [name] Medical record number: [id number] Date of birth: [birth year] Age: 57 y.o. Gender: female Date of Admission: 08/12/2012  Date of Discharge: 08/16/12 Admitting Physician: Wilhelmina Mcardle, MD  Primary Care Willadean Guyton: MATTHEWS,CODY, DO  Indication for Hospitalization: unresponsive, severe hypoglycemia Discharge Diagnoses:  1) Acute hypoglycemic encephalopathy 2) Acute respiratory failure 3) Diabetes mellitus type 2, uncontrolled with complications 4) HTN 5) Bipolar 2 disorder  Brief Hospital Course:  57 year old female with uncontrolled type 2 diabetes presented to the ED after she was found down at home.  In the ED, she was unresponsive with GCS of 3.  Narcan was administered with no response.  Work up revealed severe hypoglycemia with CBG of less than 20.  Patient was subsequently intubated for airway protection and hypoglycemia was treated with D50 and dextrose contained IV fluids.  Patient was then admitted to [facility] service.  1) Acute hypoglycemic encephalopathy and Diabetes mellitus type 2, uncontrolled with complications - Severely hypoglycemic on admission (<20).  This was do to unintentional overdose of insulin as patient likely administered too much insulin prior to going to bed. - Hypoglycemia improved following treatment with D50 and dextrose containing IV fluids. - Patients blood glucose stabilized and then SSI was restarted. - Once extubated, patient was transferred by to [facility] medicine teaching service. - CBG's remained stable during remainder of admission with no episodes of hypoglycemia - Metformin was added to SSI and patient was discharged only on Metformin to prevent hypoglycemia (this is second admission for severe hypoglycemia requiring ICU stay).  2) Acute respiratory failure - Intubated due to inability to maintain airway - Patient was slowly wean off ventilator and was extubated on  2/28  3) HTN - Initially treated with PRN Hydralazine - Subsequently home Lisinopril/HCTZ were restarted. - HTN was stable at time of discharge  4) Bipolar disorder - Home seroquel was continued after successful extubation and improvement in sedation.  Significant Labs and Imaging:   CBC BMET   Recent Labs Lab 08/12/12 1303 08/12/12 1336 08/12/12 1445 08/13/12 0420  WBC 13.0*  --  9.1 12.8*  HGB 11.1* 11.6* 12.1 9.2*  HCT 32.4* 34.0* 36.1 27.6*  PLT 342  --  290 275    Recent Labs Lab 08/13/12 0420 08/13/12 0835 08/16/12 0811  NA 131* 141 135  K 6.0* 3.6 3.2*  CL 103 106 96  CO2 20 25 27   BUN 19 20 8   CREATININE 0.78 0.87 0.65  GLUCOSE 672* 197* 285*  CALCIUM 7.4* 9.1 9.8     CBG (last 3)   Recent Labs  08/15/12 2012 08/16/12 0734 08/16/12 1149  GLUCAP 296* 248* 286*   Drugs of Abuse     Component Value Date/Time   LABOPIA NONE DETECTED 08/12/2012 1309   COCAINSCRNUR NONE DETECTED 08/12/2012 1309   COCAINSCRNUR NEG 05/29/2010 2055   LABBENZ NONE DETECTED 08/12/2012 1309   LABBENZ NEG 05/29/2010 2055   AMPHETMU NONE DETECTED 08/12/2012 1309   AMPHETMU NEG 05/29/2010 2055   THCU POSITIVE* 08/12/2012 1309   LABBARB NONE DETECTED 08/12/2012 1309    Ct Head Wo Contrast  08/12/2012 IMPRESSION: 1. No acute intracranial abnormalities. 2. Normal appearance of the brain.   Portable Chest Xray In Am  08/13/2012 IMPRESSION: Endotracheal tube tip 3 cm above the carina. Nasogastric tube tip only at the gastroesophageal junction. This may need to be adjusted. Minimal atelectasis left lower lobe. Better aeration compared to yesterday. Original Report  Authenticated By: Nelson Chimes, M.D.   Dg Chest Portable 1 View  08/12/2012 IMPRESSION: Endotracheal tube appropriately position. Nasogastric tube with side port above the gastroesophageal junction. Consider advancement. Low lung volumes without acute disease. Probable volume loss in the right infrahilar region. Consider  radiographic follow-up to exclude less likely aspiration.   Procedures: Intubation  Consultations: Critical care medicine  Discharge Medications:    Medication List    STOP taking these medications       B-D INS SYR MICROFINE 1CC/27G 27G X 5/8" 1 ML Misc  Generic drug:  Insulin Syringe-Needle U-100     B-D ULTRAFINE III SHORT PEN 31G X 8 MM Misc  Generic drug:  Insulin Pen Needle     insulin aspart 100 UNIT/ML injection  Commonly known as:  novoLOG     insulin NPH 100 UNIT/ML injection  Commonly known as:  HUMULIN N,NOVOLIN N     metoprolol tartrate 25 MG tablet  Commonly known as:  LOPRESSOR      TAKE these medications       ARIPiprazole 10 MG tablet  Commonly known as:  ABILIFY  Take 10 mg by mouth daily.     aspirin 81 MG tablet  Take 1 tablet (81 mg total) by mouth daily.     gabapentin 100 MG capsule  Commonly known as:  NEURONTIN  Take 100 mg by mouth 4 (four) times daily.     lisinopril-hydrochlorothiazide 20-12.5 MG per tablet  Commonly known as:  PRINZIDE,ZESTORETIC  Take 2 tablets daily     metFORMIN 500 MG 24 hr tablet  Commonly known as:  GLUCOPHAGE-XR  Take 1 tablet (500 mg total) by mouth daily after lunch.     metoCLOPramide 10 MG tablet  Commonly known as:  REGLAN  Take 10 mg by mouth 3 (three) times daily.     mirtazapine 30 MG tablet  Commonly known as:  REMERON  Take 30 mg by mouth at bedtime.     QUEtiapine 50 MG tablet  Commonly known as:  SEROQUEL  Take 100 mg by mouth at bedtime.       Issues for Follow Up:  1) Diabetes management needs to be re-evaluated.  Patient was discharged only on Metformin as she has had two ICU admission for severe hypoglycemia.  Please readdress diabetes regimen and consider restarting insulin.  Also consider diabetes education and endocrinology referral. 2)  HTN.  Metoprolol discontinued to prevent masking of hypoglycemia.  Consider restarting or additional agent if HTN is not well  controlled.  Outstanding Results: None  Discharge Instructions: Patient was counseled important signs and symptoms that should prompt return to medical care, changes in medications, dietary instructions, activity restrictions, and follow up appointments.   Follow-up Information   Follow up with Larue, DO On 08/20/2012. (Appointment time is 1:30pm)    Contact information:   [place] [address] [phone]       Discharge Condition: Stable. Discharged home.  Thersa Salt, DO 08/16/2012, 7:38 PM

## 2012-08-16 NOTE — Progress Notes (Signed)
Inpatient Diabetes Program Recommendations  AACE/ADA: New Consensus Statement on Inpatient Glycemic Control (2013)  Target Ranges:  Prepandial:   less than 140 mg/dL      Peak postprandial:   less than 180 mg/dL (1-2 hours)      Critically ill patients:  140 - 180 mg/dL    Received referral for this patient.  Spoke with patient extensively on 08/13/12 (see Diabetes Coordinator note from 02/28).  Noted patient has been started on Metformin XR 500 mg daily.  Patient was using NPH insulin plus Novolog at home prior to admission.  Patient told me when I spent time with her last week that she is having occasional episodes of hypoglycemia.  Does not eat well at home.     Results for RUSTINA, DOHRMAN (MRN [id number]) as of 08/16/2012 11:40  Ref. Range 08/14/2012 00:41 08/14/2012 04:02 08/14/2012 08:14 08/14/2012 12:02 08/14/2012 16:48 08/14/2012 20:09  Glucose-Capillary Latest Range: 70-99 mg/dL 207 (H) 144 (H) 166 (H) 239 (H) 215 (H) 93   Results for SAHER, HICKEL (MRN [id number]) as of 08/16/2012 11:40  Ref. Range 08/15/2012 00:09 08/15/2012 04:08 08/15/2012 07:44 08/15/2012 11:42 08/15/2012 16:58 08/15/2012 20:12  Glucose-Capillary Latest Range: 70-99 mg/dL 139 (H) 209 (H) 125 (H) 235 (H) 126 (H) 296 (H)   Results for KENNETTE, HERRA (MRN [id number]) as of 08/16/2012 11:40  Ref. Range 08/16/2012 07:34  Glucose-Capillary Latest Range: 70-99 mg/dL 248 (H)   Based on the last 3 days of CBG data, patient is having both fasting elevations and postprandial elevations.  Patient told me she used to take Lantus at home prior to NPH insulin.  If patient can afford Lantus, she may do better with a peakless insulin to cover her basal needs.  Could start with Lantus 10-14 units QHS (14 units would be 0.2 units/kg dosing).  Could send patient out on Lantus and Metformin with close follow-up with Dr. Zigmund Daniel.  Patient has a follow-up appointment on 08/20/12 at 1:30pm with Dr. Luetta Nutting. This appointment was placed on patient's  AVS.  Not sure if any of the sulfonylureas (Glipizide, Glyburide, or Amaryl) would be safe with this patient since she has admitted to poor PO intake at home.   Will follow. Wyn Quaker RN, MSN, CDE Diabetes Coordinator Inpatient Diabetes Program [phone]

## 2012-08-17 NOTE — Discharge Summary (Signed)
Reviewed and discussed on the day of discharge.  I agree with Dr. Jonathon Jordan documentation and management.

## 2012-08-17 NOTE — Progress Notes (Signed)
Family Medicine Teaching Service Attending Note  I discussed patient [place]  with Dr. Lacinda Axon and reviewed their note for today.  I agree with their assessment and plan.

## 2012-08-20 ENCOUNTER — Inpatient Hospital Stay: Payer: No Typology Code available for payment source | Admitting: Family Medicine

## 2012-08-23 ENCOUNTER — Ambulatory Visit (INDEPENDENT_AMBULATORY_CARE_PROVIDER_SITE_OTHER): Payer: No Typology Code available for payment source | Admitting: Family Medicine

## 2012-08-23 ENCOUNTER — Encounter: Payer: Self-pay | Admitting: Family Medicine

## 2012-08-23 VITALS — BP 109/74 | HR 114 | Temp 98.1°F | Ht 64.0 in | Wt 146.0 lb

## 2012-08-23 DIAGNOSIS — IMO0001 Reserved for inherently not codable concepts without codable children: Secondary | ICD-10-CM

## 2012-08-23 DIAGNOSIS — E1165 Type 2 diabetes mellitus with hyperglycemia: Secondary | ICD-10-CM

## 2012-08-23 MED ORDER — GLIPIZIDE 5 MG PO TABS: 5.0000 mg | ORAL_TABLET | Freq: Two times a day (BID) | ORAL | Status: DC

## 2012-08-23 MED ORDER — METFORMIN HCL ER (OSM) 1000 MG PO TB24: 1000.0000 mg | ORAL_TABLET | Freq: Every day | ORAL | Status: DC

## 2012-08-23 NOTE — Progress Notes (Signed)
  Subjective:    Patient ID: [name], female    DOB: 04-29-1956, 57 y.o.   MRN: [id number]  HPI Here for hosp f/u.  Admitted with hypoglycemic coma, secondary to use of NPH and Novolog late in the evening. Stopped her insulin at discharge and now BS are between 300 and too high to calculate on her machine.  Currently on metformin 500mg  daily only.   Review of Systems  Respiratory: Negative for shortness of breath.   Cardiovascular: Negative for chest pain.  Gastrointestinal: Negative for abdominal pain.  Endocrine: Positive for polydipsia and polyphagia.       Objective:   Physical Exam  Vitals reviewed. Constitutional: She appears well-developed and well-nourished.  HENT:  Head: Normocephalic and atraumatic.  Eyes: No scleral icterus (muddy).  Neck: Neck supple.  Cardiovascular: Normal rate.   Pulmonary/Chest: Effort normal.  Abdominal: Soft. There is no tenderness.          Assessment & Plan:

## 2012-08-23 NOTE — Patient Instructions (Addendum)

## 2012-08-23 NOTE — Assessment & Plan Note (Signed)
Continued problems with glycemic control.  Will add glyburide and increase her metformin.  F/u in 1 wk and if no improvement, consider adding back NPH only.

## 2012-09-01 ENCOUNTER — Encounter: Payer: Self-pay | Admitting: *Deleted

## 2012-09-01 ENCOUNTER — Ambulatory Visit (INDEPENDENT_AMBULATORY_CARE_PROVIDER_SITE_OTHER): Payer: No Typology Code available for payment source | Admitting: Family Medicine

## 2012-09-01 ENCOUNTER — Encounter: Payer: Self-pay | Admitting: Family Medicine

## 2012-09-01 VITALS — BP 124/74 | HR 123 | Ht 62.0 in | Wt 147.0 lb

## 2012-09-01 DIAGNOSIS — E1165 Type 2 diabetes mellitus with hyperglycemia: Secondary | ICD-10-CM

## 2012-09-01 DIAGNOSIS — IMO0001 Reserved for inherently not codable concepts without codable children: Secondary | ICD-10-CM

## 2012-09-01 MED ORDER — INSULIN GLARGINE 100 UNIT/ML ~~LOC~~ SOLN: 8.0000 [IU] | Freq: Every day | SUBCUTANEOUS | Status: DC

## 2012-09-01 NOTE — Patient Instructions (Addendum)
Thank you for coming in today, it was good to see you Stop taking your glipizide Continue metformin Start using the Lantus.  Start using 8 units each morning.   let me know when you have medicaid and I will send in a prescription for you. Follow up with me in one week.

## 2012-09-01 NOTE — Assessment & Plan Note (Signed)
Brittle diabetes with recent hypoglycemic episode.  Poor control with oral hypoglycemics.  Will discontinue glipizide and restart lantus.  I gave her 2 lantus sample pens to use until she got medicaid coverage.  Will start her at 8 units for now and titrate up as needed.  I will plan to see her back again in one week.

## 2012-09-01 NOTE — Progress Notes (Signed)
  Subjective:    Patient ID: [name], female    DOB: 1955-11-04, 57 y.o.   MRN: [id number]  HPI  1. CHRONIC DIABETES  Disease Monitoring  Blood Sugar Ranges: 330-430 fasting, 400-500 post prandial.   Polyuria: no   Visual problems: no   Medication Compliance: yes, currently just on metformin and glipizide. Was on novolin R and novolog previously however she was admitted for hypoglycemic episode recently.  She does not want to go back on novolog and would prefer a long acting insulin.  Has done well with lantus before and says she will get medicaid soon.    Medication Side Effects  Hypoglycemia: no, not on current medications  [place] Exam: Last year, does not remember where  Diet pattern: Erratic eating patterns, often misses meals throughout the day  Exercise: Minimal    Review of Systems Per HPI    Objective:   Physical Exam  Constitutional: She appears well-nourished. No distress.  HENT:  Head: Normocephalic and atraumatic.  Cardiovascular: Normal rate, regular rhythm and normal heart sounds.   Pulmonary/Chest: Effort normal and breath sounds normal.  Musculoskeletal: She exhibits no edema.  Neurological: She is alert.  Psychiatric:  FLattened affect.            Assessment & Plan:

## 2012-09-06 ENCOUNTER — Other Ambulatory Visit: Payer: Self-pay | Admitting: *Deleted

## 2012-09-08 ENCOUNTER — Ambulatory Visit (INDEPENDENT_AMBULATORY_CARE_PROVIDER_SITE_OTHER): Payer: No Typology Code available for payment source | Admitting: Family Medicine

## 2012-09-08 ENCOUNTER — Encounter: Payer: Self-pay | Admitting: Family Medicine

## 2012-09-08 VITALS — BP 138/89 | HR 123 | Temp 98.3°F | Ht 62.0 in | Wt 139.0 lb

## 2012-09-08 DIAGNOSIS — IMO0001 Reserved for inherently not codable concepts without codable children: Secondary | ICD-10-CM

## 2012-09-08 DIAGNOSIS — R471 Dysarthria and anarthria: Secondary | ICD-10-CM

## 2012-09-08 DIAGNOSIS — R1310 Dysphagia, unspecified: Secondary | ICD-10-CM | POA: Insufficient documentation

## 2012-09-08 DIAGNOSIS — E1165 Type 2 diabetes mellitus with hyperglycemia: Secondary | ICD-10-CM

## 2012-09-08 MED ORDER — LISINOPRIL-HYDROCHLOROTHIAZIDE 20-12.5 MG PO TABS: ORAL_TABLET | ORAL | Status: DC

## 2012-09-08 NOTE — Assessment & Plan Note (Signed)
Reports dysphagia since recent hospitalization.  Nothing to indicate difficult intubation. My concern given her coughing and choking along with her dysarthria is stroke or hypoxic event during her hospitalization.  She had a normal non contrast CT during her hospitalization.  I do not see an MRI or swallow evaluation.  Will refer to SLP for swallow evaluation/MBS and MRI/MRA.  She is currently on ASA 81mg .

## 2012-09-08 NOTE — Assessment & Plan Note (Signed)
Blood glucose remains elevated. Has not been able to tolerate metformin at 500mg  or 1000mg  dosing due to diarrhea.  Will increase lantus to 12 units and have her follow up in one week.

## 2012-09-08 NOTE — Patient Instructions (Addendum)
Thank you for coming in today, it was good to see you Increase your lantus to 12 units Stop your metformin I am going to send you for a swallow evaluation and MRI Follow up with me in 7-10 days

## 2012-09-08 NOTE — Progress Notes (Signed)
  Subjective:    Patient ID: [name], female    DOB: [birth year], 57 y.o.   MRN: [id number]  Diabetes    1. DM:  Here for follow up on diabetes.  Has been using lantus 8 units and metformin.  Has had increased diarrhea with using metformin.   Glucose has been ranging from 250-350.  She states she does not feel that well and has had decreased appetite.  She denies any hypoglycemia.   2. Dysphagia:  Reports that her decreased appetite has been due to difficulty swallowing.  She feels like food gets stuck in the back of her mouth when she tries to swallow.  She also reports frequent coughing and choking sensation with eating and drinking liquids.  Reports this has been present since her hospitalization for hypoglycemia, resulting in intubation.  She does endorse some slurred speech that has occurred since that time as well.   Review of Systems Per HPI    Objective:   Physical Exam  Constitutional: She is oriented to person, place, and time. She appears well-nourished. No distress.  Appears tired   HENT:  Head: Normocephalic and atraumatic.  Mouth/Throat: Oropharynx is clear and moist.  Neck: Neck supple.  Cardiovascular: Normal rate, regular rhythm and normal heart sounds.   Pulmonary/Chest: Effort normal and breath sounds normal.  Musculoskeletal: She exhibits no edema.  Neurological: She is alert and oriented to person, place, and time. She has normal strength. No cranial nerve deficit or sensory deficit. Gait normal.  She does exhibit a change in speech from other encounters I have had with her.  Her palate elevates symmetrically.  Tongue movement is symmetric.  No other cranial nerve deficits seen.          Assessment & Plan:

## 2012-09-14 ENCOUNTER — Ambulatory Visit (HOSPITAL_COMMUNITY)
Admission: RE | Admit: 2012-09-14 | Discharge: 2012-09-14 | Disposition: A | Discharge status: Home / Self Care | Payer: No Typology Code available for payment source | Source: Ambulatory Visit | Attending: Family Medicine | Admitting: Family Medicine

## 2012-09-14 ENCOUNTER — Ambulatory Visit (HOSPITAL_COMMUNITY): Payer: No Typology Code available for payment source

## 2012-09-14 DIAGNOSIS — R471 Dysarthria and anarthria: Secondary | ICD-10-CM

## 2012-09-14 DIAGNOSIS — I10 Essential (primary) hypertension: Secondary | ICD-10-CM | POA: Insufficient documentation

## 2012-09-14 DIAGNOSIS — R1310 Dysphagia, unspecified: Secondary | ICD-10-CM

## 2012-09-14 DIAGNOSIS — F17200 Nicotine dependence, unspecified, uncomplicated: Secondary | ICD-10-CM | POA: Insufficient documentation

## 2012-09-14 DIAGNOSIS — E119 Type 2 diabetes mellitus without complications: Secondary | ICD-10-CM | POA: Insufficient documentation

## 2012-09-14 DIAGNOSIS — R209 Unspecified disturbances of skin sensation: Secondary | ICD-10-CM | POA: Insufficient documentation

## 2012-09-22 ENCOUNTER — Ambulatory Visit (INDEPENDENT_AMBULATORY_CARE_PROVIDER_SITE_OTHER): Payer: No Typology Code available for payment source | Admitting: Family Medicine

## 2012-09-22 VITALS — BP 133/67 | HR 112 | Temp 98.8°F | Ht 62.0 in | Wt 141.5 lb

## 2012-09-22 DIAGNOSIS — R1310 Dysphagia, unspecified: Secondary | ICD-10-CM

## 2012-09-22 DIAGNOSIS — E1165 Type 2 diabetes mellitus with hyperglycemia: Secondary | ICD-10-CM

## 2012-09-22 DIAGNOSIS — IMO0001 Reserved for inherently not codable concepts without codable children: Secondary | ICD-10-CM

## 2012-09-22 LAB — POCT GLYCOSYLATED HEMOGLOBIN (HGB A1C): Hemoglobin A1C: 10

## 2012-09-22 NOTE — Progress Notes (Signed)
  Subjective:    Patient ID: [name], female    DOB: 1956/04/07, 57 y.o.   MRN: [id number]  Diabetes   1. DM:  Increased lantus to 12 units and d/c metformin at last visit.  She feels better since discontinuing metformin.  She brings in a log of her glucose that ranges from 111- >500, averaging around 250-300.  She reports she has had no symptoms of hypoglycemia.  She notices her sugars are higher after bigger meals.    2. Dysphagia/dysarthria:  Improved since last visit.  She is able to eat more and not have difficulty.  Her MRI did not show any abnormality.  She has not seen SLP yet.    Review of Systems Per HPI    Objective:   Physical Exam  Constitutional: She appears well-nourished. No distress.  Cardiovascular: Normal rate and regular rhythm.   Musculoskeletal: She exhibits no edema.  Neurological: She is alert.  Dysarthria improved.           Assessment & Plan:

## 2012-09-22 NOTE — Assessment & Plan Note (Signed)
Improved, PO intake has increased without difficulty.  MRI without stroke.  Still awaiting evaluation by SLP.

## 2012-09-22 NOTE — Assessment & Plan Note (Signed)
Wide range for blood glucose, likely due to her eating patterns.  Refuses to have novolog added back on.  Will increase lantus to 15 units, follow up in one month.

## 2012-09-22 NOTE — Patient Instructions (Addendum)
Thank you for coming in today, it was good to see you Increase your lantus to 15 units Follow up with me in one month We will check on the status of the speech referral that we put in for you.

## 2012-10-11 ENCOUNTER — Other Ambulatory Visit: Payer: Self-pay | Admitting: Family Medicine

## 2012-10-11 ENCOUNTER — Other Ambulatory Visit (HOSPITAL_COMMUNITY): Payer: Self-pay | Admitting: Family Medicine

## 2012-10-11 DIAGNOSIS — R1310 Dysphagia, unspecified: Secondary | ICD-10-CM

## 2012-10-11 NOTE — Progress Notes (Signed)
Orders entered for Modified barium swallow.

## 2012-10-15 ENCOUNTER — Ambulatory Visit (HOSPITAL_COMMUNITY)
Admission: RE | Admit: 2012-10-15 | Discharge: 2012-10-15 | Disposition: A | Discharge status: Home / Self Care | Payer: MEDICAID | Source: Ambulatory Visit | Attending: Family Medicine | Admitting: Family Medicine

## 2012-10-15 DIAGNOSIS — R1312 Dysphagia, oropharyngeal phase: Secondary | ICD-10-CM | POA: Insufficient documentation

## 2012-10-15 DIAGNOSIS — R1310 Dysphagia, unspecified: Secondary | ICD-10-CM

## 2012-10-15 NOTE — Procedures (Signed)
Objective Swallowing Evaluation:    Patient Details  Name: [name] MRN: [id number] Date of Birth: 04/30/1956  Today's Date: 10/15/2012 Time:  - 1110-1130    Past Medical History:  Past Medical History  Diagnosis Date  . DM (diabetes mellitus), type 2   . Hypercholesteremia   . HTN (hypertension)   . Trigger finger     r index inj 12/08, bilateral thumb inj 11/09, r thumb and dequervain's inj 01/2009  . Bipolar 2 disorder   . Marijuana abuse   . Anxiety   . Arthritis   . Depression   . GERD (gastroesophageal reflux disease)    Past Surgical History:  Past Surgical History  Procedure Laterality Date  . Abdominal hysterectomy  1986    fibroids  . Cardiac catheterization  05/06/2004    normal LV fxn EF>60%  . Perirpheral iridectomy  02/12/2005   HPI:    57 year old female with PMH of hospitalization in 07/2012 due to hypoglycemic encephalopathy and Diabetes mellitus type 2, uncontrolled with complications, VDRF, intubated x 24 hours. Seen for OP MBS due to c/o difficulty swallowing, both liquids and solids, globus, poor appetite. Should be noted that patient also c/o slurred speech and changes in voice beginning prior to recent hospitalization.      Assessment / Plan / Recommendation Clinical Impression    Patient presents with a mild motor based oropharyngeal dysphagia with a suspected secondary esophageal component. Oropharyngeal swallow characterized by mild oral weakness resulting in lingual pumping and peicemeal swallowing with all pos assessed however oral transit functional with swallow elicited at the level of the vallecula  with full airway protection. Pharyngeal strength WFL. Patient with c/o globus with pill consistent with esophageal sweep which revealed delayed clearance of bolus through cervical esophagus and what appeared to be thoracic esophageal residue (pill and liquid) during esophageal sweep. MD not present to confirm. Unclear origin for speech and oral  function changes as patient does not endorse a h/o acute neuro event other than encephalopathy documented in discharge summary from recent hospitalization. Recommend continuation of a regular diet, thin liquid with safe swallowing precautions and agree with OP SLP f/u. ? consideration of neuro consult. Education complete with patient regarding results of exam, aspiration risk, and use of compensatory strategies.      Treatment Recommendation    OP SLP f/u   Diet Recommendation   REgular diet, thin liquid 1. Small bites/sips 2. Follow solid with liquid 3. Sit upright at 90 degrees for 30 minutes after po intake.                         [place] MA, CCC-SLP [phone]        Functional Assessment Tool Used: skilled clinical judgement Functional Limitations: Swallowing Swallow Current Status [phone]): At least 1 percent but less than 20 percent impaired, limited or restricted Swallow Goal Status [phone]): At least 1 percent but less than 20 percent impaired, limited or restricted Swallow Discharge Status [phone]): At least 1 percent but less than 20 percent impaired, limited or restricted    [employer] Meryl 10/15/2012, 12:23 PM

## 2012-10-26 ENCOUNTER — Ambulatory Visit: Payer: No Typology Code available for payment source | Attending: Family Medicine

## 2012-10-26 DIAGNOSIS — IMO0001 Reserved for inherently not codable concepts without codable children: Secondary | ICD-10-CM | POA: Insufficient documentation

## 2012-10-26 DIAGNOSIS — R471 Dysarthria and anarthria: Secondary | ICD-10-CM | POA: Insufficient documentation

## 2012-10-26 DIAGNOSIS — R1311 Dysphagia, oral phase: Secondary | ICD-10-CM | POA: Insufficient documentation

## 2012-10-27 ENCOUNTER — Encounter: Payer: Self-pay | Admitting: Family Medicine

## 2012-10-27 ENCOUNTER — Ambulatory Visit (INDEPENDENT_AMBULATORY_CARE_PROVIDER_SITE_OTHER): Payer: No Typology Code available for payment source | Admitting: Family Medicine

## 2012-10-27 VITALS — BP 147/73 | HR 114 | Ht 62.0 in | Wt 139.0 lb

## 2012-10-27 DIAGNOSIS — IMO0001 Reserved for inherently not codable concepts without codable children: Secondary | ICD-10-CM

## 2012-10-27 DIAGNOSIS — I10 Essential (primary) hypertension: Secondary | ICD-10-CM

## 2012-10-27 DIAGNOSIS — R1310 Dysphagia, unspecified: Secondary | ICD-10-CM

## 2012-10-27 DIAGNOSIS — E1165 Type 2 diabetes mellitus with hyperglycemia: Secondary | ICD-10-CM

## 2012-10-27 NOTE — Patient Instructions (Addendum)
Thank you for coming in today, it was good to see you Continue your lantus at the current dose Pick up your blood pressure medication. Follow up in 2 months

## 2012-10-28 NOTE — Assessment & Plan Note (Signed)
Unclear etiology, possibly related to previous encephalopathy.  Receiving outpatient SLP therapy.

## 2012-10-28 NOTE — Assessment & Plan Note (Signed)
BP fairly well controlled, will pick up lisinopril/hctz and restart.  No changes in medication at this time.

## 2012-10-28 NOTE — Assessment & Plan Note (Signed)
CBG's better controlled with Lantus, given history of brittle diabetes will not increase further at this time.  Recheck A1c in two months to assess glycemic control.

## 2012-10-28 NOTE — Progress Notes (Signed)
  Subjective:    Patient ID: [name], female    DOB: 12-19-1955, 57 y.o.   MRN: [id number]  Diabetes  Hypertension   1. DM:  CHRONIC DIABETES  Disease Monitoring  Blood Sugar Ranges: 150-170 average.  Some outliers at 200-300  Polyuria: no   Visual problems: no   Medication Compliance: yes  Medication Side Effects  Hypoglycemia: no   Preventitive Health Care  Eye Exam: Due   Foot Exam: Due at next appointment  Diet pattern: Wide variety, sometimes eats things that she knows will increase her blood sugar but "can't help it"  Exercise: Walking occasionally.      2. HTN: CHRONIC HYPERTENSION  Disease Monitoring  Blood pressure range: No self monitoring at home  Chest pain: no   Dyspnea: no   Claudication: no   Medication compliance: no, out of lisinopril/hctz x10 days  Medication Side Effects  Lightheadedness: no   Urinary frequency: no   Edema: no    3. Dysphagia:  Recently had swallow study showing some dysphagia. She is receiving outpatient SLP services.  No changes in diet recommended.    Past Medical History  Diagnosis Date  . DM (diabetes mellitus), type 2   . Hypercholesteremia   . HTN (hypertension)   . Trigger finger     r index inj 12/08, bilateral thumb inj 11/09, r thumb and dequervain's inj 01/2009  . Bipolar 2 disorder   . Marijuana abuse   . Anxiety   . Arthritis   . Depression   . GERD (gastroesophageal reflux disease)       Review of Systems Per HPI    Objective:   Physical Exam  Constitutional: She appears well-nourished. No distress.  Cardiovascular: Normal rate and regular rhythm.   Pulmonary/Chest: Effort normal and breath sounds normal.  Musculoskeletal: She exhibits no edema.  Neurological: She is alert.          Assessment & Plan:

## 2012-11-01 ENCOUNTER — Ambulatory Visit: Payer: No Typology Code available for payment source | Admitting: Speech Pathology

## 2012-11-09 ENCOUNTER — Ambulatory Visit: Payer: No Typology Code available for payment source | Admitting: Speech Pathology

## 2012-11-15 ENCOUNTER — Ambulatory Visit: Payer: Medicaid Other | Attending: Family Medicine

## 2012-11-15 DIAGNOSIS — IMO0001 Reserved for inherently not codable concepts without codable children: Secondary | ICD-10-CM | POA: Insufficient documentation

## 2012-11-15 DIAGNOSIS — R471 Dysarthria and anarthria: Secondary | ICD-10-CM | POA: Insufficient documentation

## 2012-11-15 DIAGNOSIS — R1311 Dysphagia, oral phase: Secondary | ICD-10-CM | POA: Insufficient documentation

## 2012-11-29 ENCOUNTER — Encounter: Payer: No Typology Code available for payment source | Admitting: Speech Pathology

## 2012-12-02 ENCOUNTER — Encounter: Payer: No Typology Code available for payment source | Admitting: Speech Pathology

## 2012-12-07 ENCOUNTER — Encounter: Payer: Self-pay | Admitting: Speech Pathology

## 2012-12-13 ENCOUNTER — Encounter: Payer: Self-pay | Admitting: Speech Pathology

## 2012-12-15 ENCOUNTER — Encounter: Payer: Self-pay | Admitting: Speech Pathology

## 2012-12-20 ENCOUNTER — Encounter: Payer: Self-pay | Admitting: Family Medicine

## 2012-12-20 ENCOUNTER — Ambulatory Visit (INDEPENDENT_AMBULATORY_CARE_PROVIDER_SITE_OTHER): Payer: Medicaid Other | Admitting: Family Medicine

## 2012-12-20 VITALS — BP 175/90 | HR 100 | Ht 62.0 in | Wt 126.5 lb

## 2012-12-20 DIAGNOSIS — E1149 Type 2 diabetes mellitus with other diabetic neurological complication: Secondary | ICD-10-CM

## 2012-12-20 DIAGNOSIS — F3189 Other bipolar disorder: Secondary | ICD-10-CM

## 2012-12-20 DIAGNOSIS — E1165 Type 2 diabetes mellitus with hyperglycemia: Secondary | ICD-10-CM

## 2012-12-20 DIAGNOSIS — I10 Essential (primary) hypertension: Secondary | ICD-10-CM

## 2012-12-20 DIAGNOSIS — IMO0001 Reserved for inherently not codable concepts without codable children: Secondary | ICD-10-CM

## 2012-12-20 DIAGNOSIS — R1310 Dysphagia, unspecified: Secondary | ICD-10-CM

## 2012-12-20 MED ORDER — INSULIN GLARGINE 100 UNIT/ML ~~LOC~~ SOLN: 15.0000 [IU] | Freq: Every day | SUBCUTANEOUS | Status: DC

## 2012-12-20 MED ORDER — MIRTAZAPINE 15 MG PO TABS: 15.0000 mg | ORAL_TABLET | Freq: Every day | ORAL | Status: DC

## 2012-12-20 MED ORDER — ACCU-CHEK SOFT TOUCH LANCETS MISC: Status: DC

## 2012-12-20 NOTE — Assessment & Plan Note (Signed)
Some worsening of peripheral neuropathy, especially lateral aspects of bilateral feet. Has not taken Neurontin for 3x months due recent hx of dysphagia, felt like pill was getting stuck. Encouraged her to re-start her Neurontin now, that she has had some SLP swallowing therapy.

## 2012-12-20 NOTE — Patient Instructions (Addendum)
Thank you for coming in today! It was great to meet you!  Your Diabetes control has improved, with HgbA1c 8.8. But we still want you to continue to check your blood sugars 3x a day. We will order new lancets for you. Please let us know if you need any other supplies.  Medications:  Continue Lantus 15u in morning. I will refill your lantus for you. Continue Lisinopril-HCTZ blood pressure medication ( continue taking 2 tablets a day)  Try to resume your Gabapentin for your numbness in your feet  We will prescribe your Remeron (mirtazapine) 15mg  once at night  Return visits: Please return in 1-2 weeks to see Dr. Valentina Lucks regarding your blood pressure and diabetes medications. Discuss any potential changes in future when no longer on medicaid. Our goal is to continue your Lantus at that time.  Please return in 4-6 weeks to see me to follow-up your Diabetes and High Blood Pressure. At this time we will also try to get your scheduled for an eye exam here.  If you have any concerns or questions please call the clinic. If you feel you need emergent care, please go to the emergency room immediately.

## 2012-12-20 NOTE — Assessment & Plan Note (Signed)
BP significantly elevated today (initial 172/90, re-check 175/90). Admits to recent poor compliance with meds, did not take Lisinopril-HCTZ yesterday, and only took today 86min before appointment. Reported to check BP at home daily (systolic 0000000, unconfirmed). Complications - occasional lightheadedness/dizziness on standing. Otherwise, no reported complaints. Meds - Continue current Lisinopril-HCTZ 20-12.5 x2 tablets daily Follow-up - Return in 1-2 weeks for BP re-check, following better med compliance Future considerations - Pending BP re-check, consider adding 3rd agent, importance of compliance

## 2012-12-20 NOTE — Assessment & Plan Note (Addendum)
HgbA1c 8.8 today (12/20/12), improved from 10.0 (09/22/12). Does not have CBG measurements recorded, ran out of lancets and has not tested sugar for several weeks. Previously, was checking CBG 3x daily. Complications - No hypoglycemic episodes reported. Stable peripheral neuropathy HM - Diabetic foot exam done today (12/20/12), Will need Diabetic Retina Exam at next visit Meds - Continue current Lantus 15u in a.m Goals - maintain HgbA1c 8-9. Fasting glucose <150, peak post-prandial glucose <250 Future considerations - pending more data with CBG records, possible to add 1x daily Novolog injection prior to largest daily meal

## 2012-12-20 NOTE — Assessment & Plan Note (Addendum)
Previously followed by [facility]. Has not been able to make appointment with them for past 3x months due to multiple missed appointments (transportation issues). Currently out of her mood stabilizing medications: Seroquel, Abilify. Re-start Mirtazapine 15mg  daily at night (decreased dose from prior 30mg , to help improve appetite stimulation, and help with sleep) Encouraged her to spend 1 day doing a "walk-in" visit with [facility] to be seen, get her Rx refilled, and get back in their system. [facility] is best option for her, and will re-evaluate other limited options if she was unsuccessful.

## 2012-12-20 NOTE — Assessment & Plan Note (Addendum)
Reported some improvement from SLP therapy. Unsure etiology at this time. Previous work-up including MRI (negative for acute changes), attributed to encephalopathy discovered on recent hospitalization. Consider future referral to Neurology.

## 2012-12-20 NOTE — Progress Notes (Signed)
Subjective:     Patient ID: [name], female   DOB: 1956-02-03, 57 y.o.   MRN: [id number]  HPI  DIABETES Disease Monitoring: HgbA1c 8.8 today (last 10.0 09/22/12). Did not bring recorded CBGs today, ran out of lancets and unable to test sugars at home. Has functioning glucometer and test strips. Blood Sugar ranges-No CBGs Polyuria/phagia/dipsia- none      Visual problems- none Medications: Lantus 15u in a.m. Compliance- good Hypoglycemic symptoms- none  HYPERTENSION Disease Monitoring - elevated today 172/90 (re-check 175/90) Home BP Monitoring - checks daily, reports systolic 0000000 (unconfirmed)  Chest pain- none    Dyspnea- none Medications: Lisinopril-HCTZ 20-12.5mg  x2 tablets daily Compliance-  Poor (admits to not taking BP med yesterday, and only took 70min before apt today). Lightheadedness-  Occasional, often on standing  Edema- occasional, feet only   BIPOLAR / MOOD DISORDER Hx following [facility]. Reports she is unable to get new appointment, due to missing prior apts. Unable to get Rx refills, and has been out of Seroquel, Abilify, Remeron x3 months. Increased difficulty falling asleep, worsening insomnia. No suicidal / homicidal ideation.  DYSPHAGIA Hx for about 1 year, unclear what caused it. Has seen Speech and Language Pathology for therapy, with some improvement noted. Was told that had "weak swallowing muscles" Admits to significant weight loss due to decreased appetite. No chest pain, reflux symptoms.  PMH - Current smoker 0.5ppd x 15+ yrs Recent hospitalization in [id number] for complications of diabetes, poorly controlled  Review of Systems  As per HPI above.     Objective:   Physical Exam  BP 175/90  Pulse 100  Ht 5\' 2"  (1.575 m)  Wt 126 lb 8 oz (57.38 kg)  BMI 23.13 kg/m2  General - pleasant, WDWN, recent weight loss, NAD HEENT - PERRLA, MMM Neck - supple, non-tender, no thyromegaly, no LAD Heart - Tachycardic, Regular rhythm.  No murmurs, gallops  or rubs. Lungs:  Normal respiratory effort, chest expands symmetrically. Lungs are clear to auscultation, no crackles or wheezes. Abdomen - soft, NTND, no masses, +BS, no rebound/guarding Skin - no cyanosis, erythema, rashes, no edema noted in b/l LE Vascular - distal peripheral pulses intact +2 b/l Psych - AAO x3

## 2012-12-23 ENCOUNTER — Other Ambulatory Visit: Payer: Self-pay

## 2013-01-03 ENCOUNTER — Encounter: Payer: Self-pay | Admitting: Pharmacist

## 2013-01-03 ENCOUNTER — Ambulatory Visit (INDEPENDENT_AMBULATORY_CARE_PROVIDER_SITE_OTHER): Payer: Medicaid Other | Admitting: Pharmacist

## 2013-01-03 VITALS — BP 188/90 | HR 105 | Ht 62.0 in | Wt 128.7 lb

## 2013-01-03 DIAGNOSIS — E1165 Type 2 diabetes mellitus with hyperglycemia: Secondary | ICD-10-CM

## 2013-01-03 DIAGNOSIS — F17200 Nicotine dependence, unspecified, uncomplicated: Secondary | ICD-10-CM

## 2013-01-03 DIAGNOSIS — IMO0001 Reserved for inherently not codable concepts without codable children: Secondary | ICD-10-CM

## 2013-01-03 DIAGNOSIS — I10 Essential (primary) hypertension: Secondary | ICD-10-CM

## 2013-01-03 MED ORDER — LANCETS MISC. MISC: 1.0000 | Freq: Three times a day (TID) | Status: DC

## 2013-01-03 MED ORDER — NATEGLINIDE 120 MG PO TABS: 120.0000 mg | ORAL_TABLET | Freq: Three times a day (TID) | ORAL | Status: DC

## 2013-01-03 NOTE — Assessment & Plan Note (Signed)
Tobacco abuse - willing to cut down use from 10-11 per day to 8 or less per day at this time.

## 2013-01-03 NOTE — Assessment & Plan Note (Signed)
Hypertension - uncontrolled due to nonadherence for the last few days.  Willing to go directly to pharmacy and take her medication as soon as she picks up medication today.

## 2013-01-03 NOTE — Progress Notes (Signed)
S:    Patient arrives with depressed mood, ambulating without assistance.    She presents to the clinic for diabetes evaluation and management.   Patient reports having history of Diabetes since the late 1990s.  Patient has taken insulin of various types for 15 or more years.   She is unwilling at the current time to reinitiate novolog or humalog as these have caused profound hypoglycemia requiring admission to the hospital in the past.    O:    A/P: Diabetes diagnosed in the late 1990s currently with  Lab Results  Component Value Date   HGBA1C 8.8 12/20/2012   AND home fasting CBG readings of 100-150 and 2 hour post-prandial/random CBG readings of 200-250.  Denies hypoglycemic events since February and is able to verbalize appropriate hypoglycemia management plan.  Reports adherence with medication.   Control is suboptimal due to mental illness, erratic food consumption and stress.  Continued basal insulin Lantus (insulin glargine) at 15 units in the AM. Started nateglinide 120mg  prior/with meals.  Reviewed need to avoid low CBGs and encouraged her to call if she noticed readings of < 90 on multiple occasions.   May need to reduce lantus if this is seen. Written patient instructions provided.  Follow up in Pharmacist Clinic Visit in 4 weeks.   Total time in face to face counseling 35 minutes.  Tobacco abuse - willing to cut down use from 10-11 per day to 8 or less per day at this time.   Hypertension - uncontrolled due to nonadherence for the last few days.  Willing to go directly to pharmacy and take her medication as soon as she picks up medication today.

## 2013-01-03 NOTE — Patient Instructions (Addendum)
Take your Blood pressure medications as soon as you can pick it up today.   Start nateglinitide 120mg  prior to meals  Call our office if your blood sugar is < 90 on multiple occasions - we will adjust your medications to avoid you being low.   Remember to keep your # of cigarettes to 8  OR LESS per day until you come back.   Next visit with Dr. Marlene Bast for BP follow up.  Then next Rx clinic visit in 4 weeks.

## 2013-01-03 NOTE — Assessment & Plan Note (Signed)
Diabetes diagnosed in the late 1990s currently with  Lab Results  Component Value Date   HGBA1C 8.8 12/20/2012   AND home fasting CBG readings of 100-150 and 2 hour post-prandial/random CBG readings of 200-250.  Denies hypoglycemic events since February and is able to verbalize appropriate hypoglycemia management plan.  Reports adherence with medication.   Control is suboptimal due to mental illness, erratic food consumption and stress.  Continued basal insulin Lantus (insulin glargine) at 15 units in the AM. Started nateglinide 120mg  prior/with meals.  Reviewed need to avoid low CBGs and encouraged her to call if she noticed readings of < 90 on multiple occasions.   May need to reduce lantus if this is seen. Written patient instructions provided.  Follow up in Pharmacist Clinic Visit in 4 weeks.   Total time in face to face counseling 35 minutes.  Tobacco abuse - willing to cut down use from 10-11 per day to 8 or less per day at this time.   Hypertension - uncontrolled due to nonadherence for the last few days.  Willing to go directly to pharmacy and take her medication as soon as she picks up medication today.

## 2013-01-05 NOTE — Progress Notes (Signed)
Patient ID: [name], female   DOB: Mar 09, 1956, 57 y.o.   MRN: [id number] Reviewed: Agree with Dr. Graylin Shiver documentation and management.

## 2013-01-18 ENCOUNTER — Encounter: Payer: Self-pay | Admitting: Family Medicine

## 2013-01-18 ENCOUNTER — Ambulatory Visit (INDEPENDENT_AMBULATORY_CARE_PROVIDER_SITE_OTHER): Payer: Medicaid Other | Admitting: Family Medicine

## 2013-01-18 VITALS — BP 132/78 | HR 88 | Ht 62.0 in | Wt 127.3 lb

## 2013-01-18 DIAGNOSIS — I10 Essential (primary) hypertension: Secondary | ICD-10-CM

## 2013-01-18 DIAGNOSIS — E1165 Type 2 diabetes mellitus with hyperglycemia: Secondary | ICD-10-CM

## 2013-01-18 DIAGNOSIS — R1310 Dysphagia, unspecified: Secondary | ICD-10-CM

## 2013-01-18 DIAGNOSIS — E1149 Type 2 diabetes mellitus with other diabetic neurological complication: Secondary | ICD-10-CM

## 2013-01-18 DIAGNOSIS — IMO0001 Reserved for inherently not codable concepts without codable children: Secondary | ICD-10-CM

## 2013-01-18 DIAGNOSIS — F17200 Nicotine dependence, unspecified, uncomplicated: Secondary | ICD-10-CM

## 2013-01-18 DIAGNOSIS — F3189 Other bipolar disorder: Secondary | ICD-10-CM

## 2013-01-18 NOTE — Progress Notes (Signed)
  Subjective:     Patient ID: [name], female   DOB: 1955/09/20, 57 y.o.   MRN: [id number]  Diabetes  Hypertension   DIABETES Disease Monitoring: (last HgbA1c 8.8, 12/20/12) Brought detailed journal of recorded CBGs today. Pt is familiar with her low (146), high (511), and average 250-400s. She checks CBG 3-4x daily (AM, pre-lunch, pre-dinner, evening). However, she only eats 2 meals a day (lunch and dinner). Appetite has improved since last visit, previously dysphagia had limited her appetite to 1 meal daily. Complications - +Polyuria. Denies polyphagia/dipsia      Visual problems - occasional blurry vision Medications: Lantus 15u in a.m. Compliance- good Hypoglycemic symptoms- none  HYPERTENSION Disease Monitoring - improved today 132/78, HR 88 (last visit 172/90) Home BP Monitoring - checks infrequently (130s/60-70s) Chest pain- none    Dyspnea- none   Headache - none    Lightheaded/Dizzy - none    Edema-none Medications: Lisinopril-HCTZ 20-12.5mg  x2 tablets daily Compliance-  Good (previous apt had run out, and not taken)  BIPOLAR / MOOD DISORDER Hx following [facility]. Reports she was unable to get transportation to and from [place] since last apt. Unable to get Rx refills, and has been out of Seroquel, Abilify, Remeron x4 months. Plans to try to get seen at [facility]" hours, will discuss other options at next visit if unable. No suicidal / homicidal ideation.  DYSPHAGIA Prior hx for about 1 year, unclear etiology. Has worked with SLP. Continued improvement. Reports much better PO intake now, and has gained weight. No chest pain, reflux symptoms.  PMH - Current smoker 0.5ppd x 15+ yrs (not ready to quit, goal < 10 cigs daily)   Review of Systems  As per HPI above.     Objective:   Physical Exam  BP 132/78  Pulse 88  Ht 5\' 2"  (1.575 m)  Wt 127 lb 4.8 oz (57.743 kg)  BMI 23.28 kg/m2  General - pleasant, WDWN, NAD HEENT - PERRLA, MMM Neck - supple,  non-tender, no thyromegaly, no LAD Heart - RRR. No murmurs, gallops or rubs. Lungs:  CTAB. Normal respiratory effort Abdomen - soft, NTND, no masses, +BS, no rebound/guarding Skin - no cyanosis, erythema, rashes, no edema noted in b/l LE Vascular - distal peripheral pulses intact +2 b/l Neuro - grossly non-focal, normal muscle str 5/5 bilateral, decreased sensation to light touch bilateral LE (lateral feet up to ankle) Psych - AAO x3, appropriate mood, good insight

## 2013-01-18 NOTE — Assessment & Plan Note (Signed)
Last seen by Dr. Valentina Lucks 01/03/13. Willing to decrease smoking 10-11 cigs to 8. Today patient reports that she was unable to meet this goal, and has continued to smoke about 10 cigs daily. Has not tried any medications to help quit, and would be open to this. Encouraged her to continue with her goal of < 10 cigs daily, and plan to follow-up at future apts when she is more ready to quit. Will discuss meds and strategies later.  Future considerations: 1. She has tried e-cig, without much success 2. Consider Wellbutrin, possible Chantix.

## 2013-01-18 NOTE — Patient Instructions (Addendum)
Thanks for coming in today. It was good to see you again!  I'm very pleased that you have managed to keep your Blood Pressure at a great level! Keep taking your meds, and continue to check it 1-2 times per week at home. Also, don't forget limit your salt intake, especially in processed, canned, and outside foods.  Glad to hear that your appetite has improved since our last visit. It sounds like the Remeron is working.  As far as your diabetes and blood sugars are concerned, you are not due for another HgbA1c until October. We will check it at that time, but don't get discouraged. Continue to eat lunch and dinner, and I would encourage you to also drink at least a glass of OJ / Glucerna in the morning for breakfast.  For your Starlix (Nateglinide), for best results, please take it 3 times a day before/with all 3 meals (including OJ or Glucernafor breakfast). If this is still not working, we may need to switch medicines.  We will increase your Lantus from 15 units in the morning, to 20 units in the morning. In 1 week, if your fasting sugars (before meals) are still >200, then I would recommend increasing it to 25 units in the morning.  Keep recording your blood sugars daily, 3 to 4 times a day as we discussed and bring your numbers in.  Some important numbers from today's visit: HgA1C - 8.8 (last visit, 12/20/12) check new one in October BP - 132/78 - Great job!  Please schedule a follow-up appointment with me in 2 to 3 weeks. Remember to follow-up with Dr. Valentina Lucks as previously scheduled in 1 to 2 weeks.  Try to get to [facility] for their walk-in hours, and they will be the best help for your to get your medications.  If you have any other questions or concerns, please feel free to call the clinic to contact me. You may also schedule another appointment if necessary.  However, if your symptoms get significantly worse, please go to the Emergency Department to seek immediate medical  attention.  [name], [place]

## 2013-01-18 NOTE — Assessment & Plan Note (Signed)
Last visit (12/20/12) reported that she was unable to schedule a new apt with [facility] due to missing several apts in past (due to transportation issues). She reports that she would like to continue to follow with [facility] and get her medications, but has had difficulty getting there and back for their "walk-in" hours. Today she reports that she will try in the next 1 month to get there, and if unsuccessful we will look into an alternative source for follow-up.

## 2013-01-18 NOTE — Assessment & Plan Note (Addendum)
Previous HgbA1c 8.8 (12/20/12) - next due 03/22/13. Pt brought detailed journal of daily CBGs (3-4x daily; 10am (no breakfast), 2pm (pre-lunch), 6pm (pre-dinner), 10pm (evening) Fasting CBG low = 146, Range 146 - 511 (avg 300 - 400), unclear fasting vs. Post-prandial. Most of day CBG consistently high, with lowest always pre-lunch (2pm). Complications - No hypoglycemia, mild improvement in peripheral neuropathy (restarted Gabapentin), inc urination freq HM - need to schedule DM retina exam Meds - Increase to Lantus 20u in AM (in 1 week, if fasting >200s, inc to Lantus 25u in AM). Continue Starlix (nateglinide) 120mg  prior/with meals, advised to take 3x daily (can drink OJ/Glucerna for breakfast) vs was only taking 2x daily. Goals - improve to HgbA1c 7-8. Fasting glucose <200, peak post-prandial <300  Other Considerations: 1. Pt recently increased appetite (Remeron 15mg  helps, and improve dysphagia). Now eating at least 2 meals daily, previously eating 1 meal. Encouraged some small intake OJ/glucerna for breakfast. - Consider future referral to Dr. Jenne Campus for Nutrition education. 2. Previous treatment - failed Metformin due to GI side-effects. Used to have some better control with Novolin (15AM / 20PM), may consider returning to this if increased Lantus does not work. 3. Consider switching Starlix for an alterative medication.

## 2013-01-18 NOTE — Assessment & Plan Note (Signed)
Pt has resumed Gabapentin due to improved dysphagia. Reports improved LE peripheral neuropathy and pain bilaterally.

## 2013-01-18 NOTE — Assessment & Plan Note (Signed)
BP Readings from Last 3 Encounters:  01/18/13 132/78  01/03/13 188/90  12/20/12 175/90  BP significantly improved today (132/78, HR 88), checked manually. Admits to daily compliance with meds, previous apts had run out. Reported to check BP at home infrequently ([id number]) Complications - no complaints (no HA, dizzy, lightheaded, CP) Meds - Continue current Lisinopril-HCTZ 20-12.5 x2 tabs daily (total 40-25mg ) Follow-up - Return in 2-3 weeks to re-check.

## 2013-01-31 ENCOUNTER — Ambulatory Visit (INDEPENDENT_AMBULATORY_CARE_PROVIDER_SITE_OTHER): Payer: Medicaid Other | Admitting: Pharmacist

## 2013-01-31 ENCOUNTER — Encounter: Payer: Self-pay | Admitting: Pharmacist

## 2013-01-31 VITALS — BP 148/88 | HR 108 | Ht 62.5 in | Wt 130.6 lb

## 2013-01-31 DIAGNOSIS — IMO0001 Reserved for inherently not codable concepts without codable children: Secondary | ICD-10-CM

## 2013-01-31 DIAGNOSIS — E1165 Type 2 diabetes mellitus with hyperglycemia: Secondary | ICD-10-CM

## 2013-01-31 NOTE — Progress Notes (Addendum)
S:    Patient arrives in good spirits.    She presents to the clinic for diabetes follow up.  Patient has out of her psych meds and has an appoint in 1 week to follow up.    A/P: Diabetes longstanding with slight improvement in glycemic control since Lab Results  Component Value Date   HGBA1C 8.8 12/20/2012    - observed home fasting CBG readings of 100-200 and 2 hour post-prandial/random CBG readings of 100-200.  Denies hypoglycemic symptoms, but has had 2 readings below 80 in the last few weeks. She is able to verbalize appropriate hypoglycemia management plan.  Reports adherence with medication. Patient is under better control with Lantus and Starlix. Continue Lantus 20 units daily and Starlix 120 mg three times a day. Patient instructed to not take the Starlix if she does not eat.   Patient reports cutting back smoking to 8 cigarettes per day three times a week. She reports using 13 per day on the "bad days." She is continuing to work to cut back to 8 cigarettes per day.   Written patient instructions provided.  Follow up with Dr. Parks Ranger for diabetes management. Schedule an appointment with Edmonia Lynch in September for counseling on your diet. Total time in face to face counseling 30 minutes.  Patient seen with Wilfred Curtis, PharmD Resident.   Patient requested visit with nutritionist - Will ask Iver Nestle, RD to  Contact patient:  [phone]

## 2013-01-31 NOTE — Patient Instructions (Addendum)
Thank you for coming in today!  Congratulations on cutting back on your smoking!   Continue with your current medications.   Schedule an appointment with Edmonia Lynch in September for counseling on your diet.  Follow up with Dr. Parks Ranger on your diabetes.

## 2013-01-31 NOTE — Assessment & Plan Note (Signed)
Diabetes longstanding with slight improvement in glycemic control since Lab Results  Component Value Date   HGBA1C 8.8 12/20/2012    - observed home fasting CBG readings of 100-200 and 2 hour post-prandial/random CBG readings of 100-200.  Denies hypoglycemic symptoms, but has had 2 readings below 80 in the last few weeks. She is able to verbalize appropriate hypoglycemia management plan.  Reports adherence with medication. Patient is under better control with Lantus and Starlix. Continue Lantus 20 units daily and Starlix 120 mg three times a day. Patient instructed to not take the Starlix if she does not eat.   Patient reports cutting back smoking to 8 cigarettes per day three times a week. She reports using 13 per day on the "bad days." She is continuing to work to cut back to 8 cigarettes per day.   Written patient instructions provided.  Follow up with Dr. Parks Ranger for diabetes management. Schedule an appointment with Edmonia Lynch in September for counseling on your diet. Total time in face to face counseling 30 minutes.  Patient seen with Wilfred Curtis, PharmD Resident.

## 2013-02-01 NOTE — Progress Notes (Signed)
Patient ID: [name], female   DOB: 22-Apr-1956, 57 y.o.   MRN: [id number] Reviewed: Agree with Dr. Graylin Shiver assessment and management.

## 2013-02-16 ENCOUNTER — Ambulatory Visit (INDEPENDENT_AMBULATORY_CARE_PROVIDER_SITE_OTHER): Payer: Medicaid Other | Admitting: Family Medicine

## 2013-02-16 ENCOUNTER — Encounter: Payer: Self-pay | Admitting: Family Medicine

## 2013-02-16 VITALS — BP 117/73 | HR 103 | Temp 98.5°F | Ht 62.0 in | Wt 131.0 lb

## 2013-02-16 DIAGNOSIS — F3189 Other bipolar disorder: Secondary | ICD-10-CM

## 2013-02-16 DIAGNOSIS — E1165 Type 2 diabetes mellitus with hyperglycemia: Secondary | ICD-10-CM

## 2013-02-16 DIAGNOSIS — E785 Hyperlipidemia, unspecified: Secondary | ICD-10-CM

## 2013-02-16 DIAGNOSIS — IMO0001 Reserved for inherently not codable concepts without codable children: Secondary | ICD-10-CM

## 2013-02-16 DIAGNOSIS — M545 Low back pain, unspecified: Secondary | ICD-10-CM

## 2013-02-16 MED ORDER — ATORVASTATIN CALCIUM 20 MG PO TABS: 20.0000 mg | ORAL_TABLET | Freq: Every day | ORAL | Status: DC

## 2013-02-16 NOTE — Assessment & Plan Note (Addendum)
No longer taking Pravachol due to size of pill, reports feeling it gets stuck in throat. Interested in a different cholesterol medication. ASCVD 28yr risk: >30% - with DM2 recommend moderate - high intensity statin  Plan: 1. Start Atorvastatin 20mg  daily. Consider future increasing to 40mg  daily if tolerated for high dose therapy.

## 2013-02-16 NOTE — Assessment & Plan Note (Signed)
Previous HgbA1c 8.8 (12/20/12) - next due 03/22/13.  CBG journal (3-4x daily; 10am (no breakfast), 2pm (pre-lunch), 6pm (pre-dinner), 10pm (evening)  Fasting CBG low = 90, high = 265 (avg 100 - 160s), significantly improved control Complications - No hypoglycemia, no change in peripheral neuropathy, improved urination frequency HM - need to schedule DM retina exam. Previously seen at [facility] - advised patient to call to make this apt for 2014  Meds - Continue Lantus 20u in AM. Continue Starlix (nateglinide) 120mg  prior/with meals (taking 1-3 times daily) Goals - improve to HgbA1c 7-8. Fasting glucose <200, peak post-prandial <300  Plan: 1. Continue Lantus 20u daily in AM (fasting CBGs mostly <200, no need to inc now, but may consider titrate up to 25u daily if fasting >200s) 2. Continue Starlix 120mg  TID with meals - Only taking 1-2 times daily. Advised to take 3x daily if can eat regularly TID (can drink OJ/Glucerna for breakfast) 3. Eating more regularly at least 2 meals daily. But strongly encouraged regular dietary habits with healthy choices. Plan to schedule apt with Dr. Jenne Campus for meal planning counseling 4. Current treatment seems to be working - No need to switch to previous treatment of Novolin 15AM / 20PM, Failed Metformin (GI sideeffects)

## 2013-02-16 NOTE — Assessment & Plan Note (Signed)
Reported 1 week ago, woke up with LBP across back (L > R), severe sharp 10/10 with some radiation down L leg, improved now aching 5-6/10, without radiation, no shooting pain in legs, feels like "knot" in Left lower back. Has only intermittently tried Aleve for pain, with some relief. Denies red flag symptoms - no fever, urinary/stool incontinence, neurological changes / weakness Exam: +TTP L > R (L5 paraspinal), +muscle spasm, +warmth, inc pain flexion / improved extension, full active ROM, unchanged gait, negative SLR bilaterally  Plan: 1. Recommended scheduled Tylenol 650mg  TID (no more than 4000mg  daily). May use Ibuprofen for breakthrough pain. 2. Considered but did not start muscle relaxant today, due to >1 week of onset. If pain not improved in 2-4 week, advised patient to call for re-evaluation, may need to provide short-term Tramadol, with referral to PT 2. Moist heat, demonstrated soft tissue massage for husband to perform 3. Provided back exercises, stretches

## 2013-02-16 NOTE — Patient Instructions (Signed)
Dear [name], Thank you for coming in to clinic today. It was good to see you!  Today we discussed your diabetes, medications, and low back pain. 1. Continue your current diabetes regimen. No changes today. I encourage you to eat more regularly and take your Starlix medication up to 3 times a day with meals. We will try to get you an appointment with Dr. Jenne Campus to discuss your meal planning. Continue keeping your records of blood sugars, and we will check your HgbA1c again in October. 2. Continue to follow at [facility] for your bipolar medications. Please let us know if there are any changes. 3. Low back pain most likely due to muscle strain, I do not believe that this is a kidney infection. You can take Tylenol 650mg  three times a day for the next several days, and you may use Ibuprofen if your pain gets significantly worse. Since it has been longer than 1 week, I do not believe that a muscle relaxant will help at this time. Please call back if your pain is persistently worse or just not getting better in 1 week, and I can send you in a stronger pain medication. Stay active, do some back exercises and stretching, moist heat, and massage therapy will help!  Please schedule a follow-up appointment with me in 2 to 3 months. For your diabetes follow-up  Needs to be called and scheduled for appointment with Dr. Jenne Campus - Nutrition.  If you have any other questions or concerns, please feel free to call the clinic to contact me. You may also schedule an earlier appointment if necessary.  However, if your symptoms get significantly worse, please go to the Emergency Department to seek immediate medical attention.  [name], DO [facility]   Back Exercises Back exercises help treat and prevent back injuries. The goal of back exercises is to increase the strength of your abdominal and back muscles and the flexibility of your back. These exercises should be started when you no  longer have back pain. Back exercises include:  Pelvic Tilt. Lie on your back with your knees bent. Tilt your pelvis until the lower part of your back is against the floor. Hold this position 5 to 10 sec and repeat 5 to 10 times.  Knee to Chest. Pull first 1 knee up against your chest and hold for 20 to 30 seconds, repeat this with the other knee, and then both knees. This may be done with the other leg straight or bent, whichever feels better.  Sit-Ups or Curl-Ups. Bend your knees 90 degrees. Start with tilting your pelvis, and do a partial, slow sit-up, lifting your trunk only 30 to 45 degrees off the floor. Take at least 2 to 3 seconds for each sit-up. Do not do sit-ups with your knees out straight. If partial sit-ups are difficult, simply do the above but with only tightening your abdominal muscles and holding it as directed.  Hip-Lift. Lie on your back with your knees flexed 90 degrees. Push down with your feet and shoulders as you raise your hips a couple inches off the floor; hold for 10 seconds, repeat 5 to 10 times.  Back arches. Lie on your stomach, propping yourself up on bent elbows. Slowly press on your hands, causing an arch in your low back. Repeat 3 to 5 times. Any initial stiffness and discomfort should lessen with repetition over time.  Shoulder-Lifts. Lie face down with arms beside your body. Keep hips and torso pressed to  floor as you slowly lift your head and shoulders off the floor. Do not overdo your exercises, especially in the beginning. Exercises may cause you some mild back discomfort which lasts for a few minutes; however, if the pain is more severe, or lasts for more than 15 minutes, do not continue exercises until you see your caregiver. Improvement with exercise therapy for back problems is slow.  See your caregivers for assistance with developing a proper back exercise program. Document Released: 07/10/2004 Document Revised: 08/25/2011 Document Reviewed:  04/03/2011 [facility] Patient Information 2014 [place].  Back Exercises Back exercises help treat and prevent back injuries. The goal is to increase your strength in your belly (abdominal) and back muscles. These exercises can also help with flexibility. Start these exercises when told by your doctor. HOME CARE Back exercises include: Pelvic Tilt.  Lie on your back with your knees bent. Tilt your pelvis until the lower part of your back is against the floor. Hold this position 5 to 10 sec. Repeat this exercise 5 to 10 times. Knee to Chest.  Pull 1 knee up against your chest and hold for 20 to 30 seconds. Repeat this with the other knee. This may be done with the other leg straight or bent, whichever feels better. Then, pull both knees up against your chest. Sit-Ups or Curl-Ups.  Bend your knees 90 degrees. Start with tilting your pelvis, and do a partial, slow sit-up. Only lift your upper half 30 to 45 degrees off the floor. Take at least 2 to 3 seonds for each sit-up. Do not do sit-ups with your knees out straight. If partial sit-ups are difficult, simply do the above but with only tightening your belly (abdominal) muscles and holding it as told. Hip-Lift.  Lie on your back with your knees flexed 90 degrees. Push down with your feet and shoulders as you raise your hips 2 inches off the floor. Hold for 10 seconds, repeat 5 to 10 times. Back Arches.  Lie on your stomach. Prop yourself up on bent elbows. Slowly press on your hands, causing an arch in your low back. Repeat 3 to 5 times. Shoulder-Lifts.  Lie face down with arms beside your body. Keep hips and belly pressed to floor as you slowly lift your head and shoulders off the floor. Do not overdo your exercises. Be careful in the beginning. Exercises may cause you some mild back discomfort. If the pain lasts for more than 15 minutes, stop the exercises until you see your doctor. Improvement with exercise for back problems is slow.   Document Released: 07/05/2010 Document Revised: 08/25/2011 Document Reviewed: 04/03/2011 [facility] Patient Information 2014 [place], [place].

## 2013-02-16 NOTE — Assessment & Plan Note (Signed)
Patient was able to be seen at [facility] through walk-in hours about 1 week ago (August 25). She is now re-established with their clinic and given 6 week follow-up appointment. Medications - resumed Abilify, Seroquel, Remeron Reports improved mood since resumed medications. No longer feels "down". No active thoughts of harming self or others. Continue to follow with [facility].

## 2013-02-16 NOTE — Progress Notes (Signed)
Subjective:     Patient ID: [name], female   DOB: 08-02-1955, 57 y.o.   MRN: [id number]  HPI  CHRONIC DIABETES Disease Monitoring  Recent Concerns: None - brought in detailed CBG journal  Blood Sugar Ranges: avg 100-160s, high 265, low 90  Frequency of CBG Checks: 4 x daily (AM, pre/post meals, PM)  Polyuria: no - improved  Visual problems: yes - has been 1 yr since eye exam, no recent changes  Foot Ulcers: no Medication Compliance: yes, Lantus 20u daily in AM, Starlix 120mg  TID (non-compliant due to not eating 3 meals daily. Does take it with every meal 1-2 times daily)  Medication Side Effects  Hypoglycemia: no  Preventitive Health Care  Eye Exam: Last done 2013 - pt plans to call [facility] to schedule apt for 2014   Foot Exam: Done 12/20/2012  Diet pattern: irregular, non-balanced diet, patient unable to provide detailed info on regular meal but interested in meeting with Dr. Jenne Campus for nutritional meal planning  Exercise: none  BACK PAIN Location: Low back L > R (L5 region)  Duration: constant, improving Preceding Events: woke up with pain, no injury Onset: 1 week ago, woke up with  Trauma: None Quality: aching Radiation: none (improved, initial reported radiation down L leg) Worse with: flexion Better with: extension Hx of Intervention: improve with Aleve. No prior surgery or PT Hx of Imaging: none Red Flags Fecal/urinary incontinence: no  Numbness/Weakness: no  Fever/chills/sweats: no  Night pain: no  Unexplained weight loss: no  No relief with bedrest: no  h/o cancer/immunosuppression: no  IV drug use: no  PMH of osteoporosis or chronic steroid use: no   BIPOLAR DISORDER: Followed at [facility]. Was able to be seen last week for first time in months at [facility] walk-in clinic, and now she states that she is re-established for routine follow-ups. Next apt in 6 weeks. Refilled medications: Abilify, Seroquel, Remeron (15mg  daily, increased from 7.5) Reports  improved mood, increased energy, does not feel "down". No active thoughts of harming self or others.  HYPERLIPIDEMIA: Reports no longer taking Pravachol 40mg  daily, due to size of pill. Felt like it was getting stuck in throat. Wants to keep taking cholesterol medication, but interested in different pill. Reviewed ASCVD 10 yr risk, and indication for statin therapy.  Social Hx - continues to smoke, working on decreasing to 8 cigs 3x weekly, other days 10-12   Review of Systems  Per above HPI.     Objective:   Physical Exam  BP 117/73  Pulse 103  Temp(Src) 98.5 F (36.9 C) (Oral)  Ht 5\' 2"  (1.575 m)  Wt 131 lb (59.421 kg)  BMI 23.95 kg/m2  General - pleasant, WDWN, NAD  HEENT - PERRLA, MMM  Neck - supple, non-tender Heart - RRR. No murmurs heard Lungs: CTAB. Normal respiratory effort  Abdomen - soft, NTND, no masses, +BS, no rebound/guarding  Skin - no cyanosis, erythema, rashes, no edema noted in b/l LE  Vascular - distal peripheral pulses intact +2 b/l  MSK: +TTP L > R (L5 paraspinal), +muscle spasm, +warmth, inc pain flexion / improved extension, full active ROM, unchanged gait, negative SLR bilaterally Neuro - grossly non-focal, normal muscle str 5/5 bilateral, decreased sensation to light touch bilateral LE (lateral feet up to ankle) - unchanged from prior exam Psych - AAO x3, appropriate mood, good insight. Blunted affect

## 2013-04-11 ENCOUNTER — Other Ambulatory Visit: Payer: Self-pay | Admitting: Family Medicine

## 2013-04-11 DIAGNOSIS — E1165 Type 2 diabetes mellitus with hyperglycemia: Secondary | ICD-10-CM

## 2013-04-21 ENCOUNTER — Encounter: Payer: Self-pay | Admitting: Family Medicine

## 2013-04-21 NOTE — Progress Notes (Signed)
Patient ID: [name], female   DOB: [birth year], 57 y.o.   MRN: [id number] Left a message on 10/27 at phone number [phone], asking pt to call to schedule MNT.  Have not heard back by today, 10 days later.

## 2013-05-31 ENCOUNTER — Ambulatory Visit (INDEPENDENT_AMBULATORY_CARE_PROVIDER_SITE_OTHER): Payer: Medicare Other | Admitting: Family Medicine

## 2013-05-31 ENCOUNTER — Encounter: Payer: Self-pay | Admitting: Family Medicine

## 2013-05-31 VITALS — BP 149/69 | HR 114 | Temp 98.0°F | Ht 62.0 in | Wt 144.2 lb

## 2013-05-31 DIAGNOSIS — E1149 Type 2 diabetes mellitus with other diabetic neurological complication: Secondary | ICD-10-CM

## 2013-05-31 DIAGNOSIS — IMO0001 Reserved for inherently not codable concepts without codable children: Secondary | ICD-10-CM

## 2013-05-31 DIAGNOSIS — I10 Essential (primary) hypertension: Secondary | ICD-10-CM

## 2013-05-31 DIAGNOSIS — R079 Chest pain, unspecified: Secondary | ICD-10-CM | POA: Insufficient documentation

## 2013-05-31 DIAGNOSIS — Z23 Encounter for immunization: Secondary | ICD-10-CM

## 2013-05-31 DIAGNOSIS — E785 Hyperlipidemia, unspecified: Secondary | ICD-10-CM

## 2013-05-31 DIAGNOSIS — E1165 Type 2 diabetes mellitus with hyperglycemia: Secondary | ICD-10-CM

## 2013-05-31 LAB — LDL CHOLESTEROL, DIRECT: Direct LDL: 101 mg/dL — ABNORMAL HIGH

## 2013-05-31 MED ORDER — ATORVASTATIN CALCIUM 40 MG PO TABS: 40.0000 mg | ORAL_TABLET | Freq: Every day | ORAL | Status: DC

## 2013-05-31 MED ORDER — NITROGLYCERIN 0.4 MG SL SUBL: 0.4000 mg | SUBLINGUAL_TABLET | SUBLINGUAL | Status: DC | PRN

## 2013-05-31 MED ORDER — GLUCOSE BLOOD VI STRP: ORAL_STRIP | Status: DC

## 2013-05-31 NOTE — Assessment & Plan Note (Addendum)
Recent worsening R>L suspected DM peripheral neuropathy with intermittent sharp tingling pain in foot / toes. Currently on Gabapentin 100mg  QID.  Plan: 1. Increase Gabapentin up to 200mg  TID.

## 2013-05-31 NOTE — Assessment & Plan Note (Signed)
Occasional CP consistent with angina, however not associated with exertion (does endorse DOE). Significant cardiac h/o 2012 seen by Dr. Aundra Dubin for chest pain. 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 Meds - ASA daily, Lipitor 20mg  daily, out of Nitro  Plan: 1. Referral to Cardiology for further evaluation and work-up, consider outpatient stress test, ECHO? 2. Increase Lipitor to 40mg  daily 3. Refilled rx for Nitro SL 4. Continue daily ASA 5. Discussed beta-blocker benefits, but declined to start at this time. Revisit in future 7. Advised smoking cessation.

## 2013-05-31 NOTE — Patient Instructions (Signed)
Dear Stark Jock, Thank you for coming in to clinic today. It was good to see you again!  Today we discussed your Diabetes, Nerve Pain, and Chest Pain. 1. Congratulations on your improved blood sugar control. Keep up the good work! 2. Continue taking your Lantus 25 units daily and Starlix with every meal. 3. Please call the clinic and leave a message with the nurse with the type of test strips that you need, and I will send in a prescription to your pharmacy. 4. I'm pleased to hear that you're going to your Eye Doctor appointment in December at [facility]. 5. For your chest pain, I'm going to send a referral for you to return to the Heart Doctor for further evaluation. Other medication changes:   - Increase Lipitor (atorvastatin) to 40mg  daily (take 2 of your 20mg  pills start tomorrow), new prescription is for one 40mg  tab daily   - sent new rx for Nitroglycerin (under your tongue when having Chest Pain)   - take your Aspirin Daily   - Increase Gabapentin to 1 tab in morning, 2 tabs with lunch, and 2 tabs at bedtime (you can take 2 tabs three times a day if needed in the future). Please call clinic if still persistent nerve pain in your toes.  Some important numbers from today's visit: HgA1C - 7.7 (last one was 8.8) BP - 149/69  Please schedule a follow-up appointment with me in 3 months to review your Diabetes.  You will be called with an appointment for the Heart Doctor.  If you have any other questions or concerns, please feel free to call the clinic to contact me. You may also schedule an earlier appointment if necessary.  However, if your symptoms get significantly worse, please go to the Emergency Department to seek immediate medical attention.  [name], [place]

## 2013-05-31 NOTE — Assessment & Plan Note (Signed)
Improved control, BP today 149/69  Meds - Lisinopril-HCTZ 20-12.5 x 2 tab daily No complications   Plan:  1. Continue current BP meds.  2. Lifestyle Mods - Smoking cessation, Exercise, Dec salt intake, inc K+ rich vegs

## 2013-05-31 NOTE — Assessment & Plan Note (Signed)
Significantly improved control Current HgbA1c 7.7 (05/31/13), last HgbA1c 8.8 (12/20/12). Meds: Lantus 25u daily, Starlix 120mg  TID Complications - peripheral neuropathy, DM retinopathy (pending complete Ophtho eval, scheduled at [facility] 06/07/13, plan to request records). HM: Eye Exam (scheduled 06/07/13), Foot Exam (performed 05/31/13)  Plan:  1. Continue current therapy. 2. Lifestyle Mods - Improved DM diet (dec carbs, inc vegs), now with more regular meal schedule, Glucerna. Need to focus on improving exercise  Future Consideration: 1. If A1c worsens, review PM CBGs, if elevated would consider titrating Lantus up gradually. 2. Question adherence to Starlix, consider alternative medication.

## 2013-05-31 NOTE — Progress Notes (Addendum)
Subjective:     Patient ID: [name], female   DOB: [birth year], 57 y.o.   MRN: [id number]  HPI  CHRONIC DM, Type 2: Reports she ran out of test strips x 2 months. Recently had eye exam at [facility], told that she has Diabetic Retinopathy, referred to Ophthalmologist at [facility], scheduled 06/07/13. Today HgbA1c 7.7 (last 8.8). CBGs: Avg 120-150s, Low 73, High 220. Checks CBGs 1-2x daily (currently out of strips) Meds: Lantus 25u daily in AM, Starlix 120mg  TID with meals (holds if doesn't eat, takes 2-3x daily) Reports good compliance. Tolerating well w/o side-effects Currently on ACEi Complications: Noted some worsening peripheral neuropathy, with sharp tingling pain in R-foot. HM: Eye Exam (scheduled Ophtho 06/07/13), Foot Exam (Done today 05/31/13)  Diet (Improved appetite, still poor but added Glucerna 1-2x daily, positive wt gain up 13 lbs in 3 months, feels much better and near b/l weight) Exercise (walks infrequently)  H/o CHEST PAIN: Currently no active chest pain or pressure. Reports occasional central chest pain / pressure that has occurred within the past week, without associated symptoms. Onset not associated with exertion, last episode while laying in bed, aching / pressure, goes away with rest. Has not tried ASA or Nitro (currently out). Notes experiencing DOE in recent past as well. Not currently followed by Cardiology, per chart review h/o prior Cardiology referral 2012, seen by Dr. 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  CHRONIC HTN: Elevated BP today, 149/69. Reports no complaints. Current Meds - Lisinopril-HCTZ 20-12.5mg  x 2 tabs daily   Reports good compliance, took meds today. Tolerating well, w/o complaints.  Social Hx: Current smoker 8-10 cigs daily, understands need to cut down. H/o half pack >15 yrs.  Review of Systems  See above HPI.  Admits to intermittent sharp pain with numbness R>L foot,  poor appetite (improving), occasional chest pain / pressure, DOE. Denies hypoglycemia, polyuria, visual changes, fever, chills, dizziness, lightheadedness, edema, abdominal pain, n/v.     Objective:   Physical Exam  BP 149/69  Pulse 114  Temp(Src) 98 F (36.7 C) (Oral)  Ht 5\' 2"  (1.575 m)  Wt 144 lb 3.2 oz (65.409 kg)  BMI 26.37 kg/m2  General - pleasant 57 y.o AAF, NAD  HEENT - PERRLA, MMM  Neck - supple, non-tender, no carotid bruits heard  Heart - RRR. No murmurs heard  Lungs: CTAB, without crackles, rhonchi, or wheezing. Normal respiratory effort  Abdomen - soft, NTND, no masses, +BS Skin - no cyanosis, erythema, rashes, no edema noted in b/l LE  Ext - distal peripheral pulses intact +2 b/l  Neuro - grossly non-focal, muscle strength 4/5 grip, otherwise 5/5 all ext, decreased sensation to light touch bilateral LE (unchanged) Psych - AAO x3, appropriate mood with congruent affect, good insight  DM Foot Exam: decreased sensation to light touch b/l feet, intact monofilament testing b/l feet except reduced sensation lateral (L>R), left lateral callus noted. No ulcers or wounds      Assessment:     See specific A&P problem list for details.      Plan:     See specific A&P problem list for details.

## 2013-06-01 ENCOUNTER — Telehealth: Payer: Self-pay | Admitting: Family Medicine

## 2013-06-01 DIAGNOSIS — E119 Type 2 diabetes mellitus without complications: Secondary | ICD-10-CM

## 2013-06-01 MED ORDER — GLUCOSE BLOOD VI STRP: ORAL_STRIP | Status: DC

## 2013-06-01 NOTE — Telephone Encounter (Signed)
Patient was told to call back with the name of her test strips so Dr. Parks Ranger can send in a refill. Name of strips is [employer].

## 2013-06-01 NOTE — Telephone Encounter (Signed)
Will fwd to PCP.  [name] L, CMA  

## 2013-06-01 NOTE — Telephone Encounter (Signed)
Sent new rx for appropriate test strips.

## 2013-06-22 ENCOUNTER — Encounter: Payer: Self-pay | Admitting: General Surgery

## 2013-06-22 ENCOUNTER — Ambulatory Visit (INDEPENDENT_AMBULATORY_CARE_PROVIDER_SITE_OTHER): Payer: Medicare Other | Admitting: Cardiology

## 2013-06-22 ENCOUNTER — Encounter: Payer: Self-pay | Admitting: Cardiology

## 2013-06-22 VITALS — BP 118/70 | HR 98 | Ht 62.0 in | Wt 142.0 lb

## 2013-06-22 DIAGNOSIS — R002 Palpitations: Secondary | ICD-10-CM

## 2013-06-22 DIAGNOSIS — R0602 Shortness of breath: Secondary | ICD-10-CM

## 2013-06-22 DIAGNOSIS — R079 Chest pain, unspecified: Secondary | ICD-10-CM

## 2013-06-22 DIAGNOSIS — I10 Essential (primary) hypertension: Secondary | ICD-10-CM

## 2013-06-22 NOTE — Patient Instructions (Addendum)
Your physician recommends that you continue on your current medications as directed. Please refer to the Current Medication list given to you today.  Your physician has requested that you have an exercise stress myoview. For further information please visit [email or web address]. Please follow instruction sheet, as given.  Your physician has requested that you have an echocardiogram. Echocardiography is a painless test that uses sound waves to create images of your heart. It provides your doctor with information about the size and shape of your heart and how well your heart's chambers and valves are working. This procedure takes approximately one hour. There are no restrictions for this procedure.  Your physician has recommended that you wear an event monitor. Event monitors are medical devices that record the heart's electrical activity. Doctors most often [place] these monitors to diagnose arrhythmias. Arrhythmias are problems with the speed or rhythm of the heartbeat. The monitor is a small, portable device. You can wear one while you do your normal daily activities. This is usually used to diagnose what is causing palpitations/syncope (passing out).  Your physician recommends that you schedule a follow-up appointment as needed

## 2013-06-22 NOTE — Progress Notes (Signed)
[address] [place],   [address] Phone: [phone] Fax:  [phone]  Date:  06/22/2013   ID:  [name], DOB 1955-09-16, MRN [id number]  PCP:  [name], DO  Cardiologist:  Fransico Him, MD     History of Present Illness: [name] is a 58 y.o. female with a history of DM, dyslipidemia, HTN and GERD who presents today for evaluation of chest pain.  The episodes occur about twice monthly and usually lasts constantly for a day and then the next day it has resolved.  The pain is described as a sharp and pressure sensation that is exacerbated by any movement including deep breathing.  She also has SOB with the pain.  It is located over her left breast and into her left arm.  She also complains of DOE when not having the pain and a fluttering in her chest.  She occasionally has LE edema.  Cardiology is now was to consult to evaluate CP further.   Wt Readings from Last 3 Encounters:  06/22/13 142 lb (64.411 kg)  05/31/13 144 lb 3.2 oz (65.409 kg)  02/16/13 131 lb (59.421 kg)     Past Medical History  Diagnosis Date  . DM (diabetes mellitus), type 2   . Hypercholesteremia   . HTN (hypertension)   . Trigger finger     r index inj 12/08, bilateral thumb inj 11/09, r thumb and dequervain's inj 01/2009  . Bipolar 2 disorder   . Marijuana abuse   . Anxiety   . Arthritis   . Depression   . GERD (gastroesophageal reflux disease)   . Chest pain     Current Outpatient Prescriptions  Medication Sig Dispense Refill  . ARIPiprazole (ABILIFY) 10 MG tablet Take 10 mg by mouth daily.      [name] aspirin 81 MG tablet Take 1 tablet (81 mg total) by mouth daily.  30 tablet  11  . atorvastatin (LIPITOR) 40 MG tablet Take 1 tablet (40 mg total) by mouth daily.  90 tablet  3  . gabapentin (NEURONTIN) 100 MG capsule Take 100 mg by mouth 4 (four) times daily.      [name] glucose blood test strip Use as instructed  100 each  12  . insulin glargine (LANTUS) 100  UNIT/ML injection Inject 25 Units into the skin daily.       . Lancets (ACCU-CHEK SOFT TOUCH) lancets Use as instructed  100 each  12  . Lancets Misc. MISC 1 Container by Does not apply route 3 (three) times daily before meals. Use for three times daily testing of CBGs. Dispense 1 month supply.  1 each  11  . lisinopril-hydrochlorothiazide (PRINZIDE,ZESTORETIC) 20-12.5 MG per tablet Take 2 tablets daily  60 tablet  6  . metoCLOPramide (REGLAN) 10 MG tablet Take 10 mg by mouth 3 (three) times daily.      . mirtazapine (REMERON) 15 MG tablet Take 1 tablet (15 mg total) by mouth at bedtime.  30 tablet  3  . nateglinide (STARLIX) 120 MG tablet Take 1 tablet (120 mg total) by mouth 3 (three) times daily before meals.  90 tablet  3  . nitroGLYCERIN (NITROSTAT) 0.4 MG SL tablet Place 1 tablet (0.4 mg total) under the tongue every 5 (five) minutes as needed for chest pain.  50 tablet  3  . QUEtiapine (SEROQUEL) 50 MG tablet Take 100 mg by mouth at bedtime.       No current facility-administered medications  for this visit.    Allergies:    Allergies  Allergen Reactions  . Ibuprofen Other (See Comments)    REACTION: chest tightness and feeling like something stuck in chest    Social History:  The patient  reports that she has been smoking Cigarettes.  She has a 15 pack-year smoking history. She has never used smokeless tobacco. She reports that she does not drink alcohol or use illicit drugs.   Family History:  The patient's family history includes Bipolar disorder in her brother, father, and sister; Diabetes in her mother; Heart attack in her father; Heart disease in her mother. There is no history of Colon cancer or Esophageal cancer.   ROS:  Please see the history of present illness.      All other systems reviewed and negative.   PHYSICAL EXAM: VS:  BP 118/70  Pulse 98  Ht 5\' 2"  (1.575 m)  Wt 142 lb (64.411 kg)  BMI 25.97 kg/m2 Well nourished, well developed, in no acute distress HEENT:  normal Neck: no JVD Cardiac:  normal S1, S2; RRR; no murmur Lungs:  clear to auscultation bilaterally, no wheezing, rhonchi or rales Abd: soft, nontender, no hepatomegaly Ext: no edema Skin: warm and dry Neuro:  CNs 2-12 intact, no focal abnormalities noted  EKG:  NSR with minimal voltage criteria for LVH     ASSESSMENT AND PLAN:  1. Chest pain with typical and atypical components and several CRF including DM, HTN, post menopausal state, family history of ASCAD and dyslipidemia.    - stress myoview to rule out ischemia 2. DOE  - 2D echo to assess LVF and diastolic function 3. Palpitations  - event monitor 4. HTN - well controlled  - continue Prinizde  Followup PRN pending results of studies Signed, Fransico Him, MD 07-11-2013 8:30 AM

## 2013-06-29 ENCOUNTER — Encounter (INDEPENDENT_AMBULATORY_CARE_PROVIDER_SITE_OTHER): Payer: Medicare Other

## 2013-06-29 ENCOUNTER — Encounter: Payer: Self-pay | Admitting: *Deleted

## 2013-06-29 DIAGNOSIS — R002 Palpitations: Secondary | ICD-10-CM

## 2013-06-29 NOTE — Progress Notes (Signed)
Patient ID: [name], female   DOB: 14-Jan-1956, 58 y.o.   MRN: [id number] [employer] 30 day cardiac event monitor applied to patient.

## 2013-07-05 ENCOUNTER — Ambulatory Visit (HOSPITAL_COMMUNITY): Payer: Medicare Other | Attending: Cardiovascular Disease | Admitting: Radiology

## 2013-07-05 DIAGNOSIS — E1142 Type 2 diabetes mellitus with diabetic polyneuropathy: Secondary | ICD-10-CM | POA: Insufficient documentation

## 2013-07-05 DIAGNOSIS — IMO0001 Reserved for inherently not codable concepts without codable children: Secondary | ICD-10-CM | POA: Insufficient documentation

## 2013-07-05 DIAGNOSIS — R0602 Shortness of breath: Secondary | ICD-10-CM | POA: Insufficient documentation

## 2013-07-05 DIAGNOSIS — F1210 Cannabis abuse, uncomplicated: Secondary | ICD-10-CM | POA: Insufficient documentation

## 2013-07-05 DIAGNOSIS — I10 Essential (primary) hypertension: Secondary | ICD-10-CM | POA: Insufficient documentation

## 2013-07-05 DIAGNOSIS — F17200 Nicotine dependence, unspecified, uncomplicated: Secondary | ICD-10-CM | POA: Insufficient documentation

## 2013-07-05 DIAGNOSIS — I079 Rheumatic tricuspid valve disease, unspecified: Secondary | ICD-10-CM | POA: Insufficient documentation

## 2013-07-05 DIAGNOSIS — E1149 Type 2 diabetes mellitus with other diabetic neurological complication: Secondary | ICD-10-CM | POA: Insufficient documentation

## 2013-07-05 DIAGNOSIS — E785 Hyperlipidemia, unspecified: Secondary | ICD-10-CM | POA: Insufficient documentation

## 2013-07-05 DIAGNOSIS — R002 Palpitations: Secondary | ICD-10-CM | POA: Insufficient documentation

## 2013-07-05 NOTE — Progress Notes (Signed)
Echocardiogram performed.  

## 2013-07-11 ENCOUNTER — Other Ambulatory Visit: Payer: Self-pay | Admitting: Family Medicine

## 2013-07-11 ENCOUNTER — Telehealth: Payer: Self-pay | Admitting: General Surgery

## 2013-07-11 DIAGNOSIS — IMO0001 Reserved for inherently not codable concepts without codable children: Secondary | ICD-10-CM

## 2013-07-11 DIAGNOSIS — E1165 Type 2 diabetes mellitus with hyperglycemia: Principal | ICD-10-CM

## 2013-07-11 MED ORDER — LISINOPRIL-HYDROCHLOROTHIAZIDE 20-12.5 MG PO TABS: ORAL_TABLET | ORAL | Status: DC

## 2013-07-11 MED ORDER — METOPROLOL SUCCINATE ER 25 MG PO TB24: 25.0000 mg | ORAL_TABLET | Freq: Every day | ORAL | Status: DC

## 2013-07-11 NOTE — Telephone Encounter (Signed)
Message copied by Lily Kocher on Mon Jul 11, 2013 11:54 AM ------      Message from: Fransico Him R      Created: Tue Jul 05, 2013  9:40 AM       Please let patient know that echo showed normal LVF with severely thickened and stiff heart muscle.  Please have her decrease her Lisinopril HCT to 1 tablet daily and start Toprol XL 25mg  daily and followup with me in 2 weeks ------

## 2013-07-11 NOTE — Telephone Encounter (Signed)
Will fwd to MD.  Marques Ericson L, CMA  

## 2013-07-11 NOTE — Telephone Encounter (Signed)
Pt made aware. Med list updated. I could not schedule appt. Sending request to the ladies up front to make appt in two weeks for pt.

## 2013-07-12 ENCOUNTER — Ambulatory Visit (HOSPITAL_COMMUNITY): Payer: Medicare Other | Attending: Cardiology | Admitting: Radiology

## 2013-07-12 VITALS — BP 140/74 | HR 98 | Ht 62.0 in | Wt 141.0 lb

## 2013-07-12 DIAGNOSIS — E119 Type 2 diabetes mellitus without complications: Secondary | ICD-10-CM | POA: Insufficient documentation

## 2013-07-12 DIAGNOSIS — R079 Chest pain, unspecified: Secondary | ICD-10-CM | POA: Insufficient documentation

## 2013-07-12 DIAGNOSIS — Z794 Long term (current) use of insulin: Secondary | ICD-10-CM | POA: Insufficient documentation

## 2013-07-12 DIAGNOSIS — R0989 Other specified symptoms and signs involving the circulatory and respiratory systems: Secondary | ICD-10-CM | POA: Insufficient documentation

## 2013-07-12 DIAGNOSIS — F17200 Nicotine dependence, unspecified, uncomplicated: Secondary | ICD-10-CM | POA: Insufficient documentation

## 2013-07-12 DIAGNOSIS — Z8249 Family history of ischemic heart disease and other diseases of the circulatory system: Secondary | ICD-10-CM | POA: Insufficient documentation

## 2013-07-12 DIAGNOSIS — R0602 Shortness of breath: Secondary | ICD-10-CM | POA: Insufficient documentation

## 2013-07-12 DIAGNOSIS — R42 Dizziness and giddiness: Secondary | ICD-10-CM | POA: Insufficient documentation

## 2013-07-12 DIAGNOSIS — R002 Palpitations: Secondary | ICD-10-CM | POA: Insufficient documentation

## 2013-07-12 DIAGNOSIS — I10 Essential (primary) hypertension: Secondary | ICD-10-CM | POA: Insufficient documentation

## 2013-07-12 DIAGNOSIS — R0609 Other forms of dyspnea: Secondary | ICD-10-CM | POA: Insufficient documentation

## 2013-07-12 MED ORDER — TECHNETIUM TC 99M SESTAMIBI GENERIC - CARDIOLITE
30.0000 | Freq: Once | INTRAVENOUS | Status: AC | PRN
  Administered 2013-07-12: 30 via INTRAVENOUS

## 2013-07-12 MED ORDER — TECHNETIUM TC 99M SESTAMIBI GENERIC - CARDIOLITE
10.0000 | Freq: Once | INTRAVENOUS | Status: AC | PRN
  Administered 2013-07-12: 10 via INTRAVENOUS

## 2013-07-12 NOTE — Progress Notes (Signed)
[facility] [address] [place], [place] [address] [phone]    Cardiology Nuclear Med Study  [name] is a 58 y.o. female     MRN : [id number]     DOB: 1956-02-22  Procedure Date: 07/12/2013  Nuclear Med Background Indication for Stress Test:  Evaluation for Ischemia History:  No known CAD, Cath (normal per pt.), Echo 2015 EF 60-65% Cardiac Risk Factors: Family History - CAD, Hypertension, IDDM,  Lipids and Smoker  Symptoms:  Chest Pain, Dizziness, DOE, Palpitations and SOB   Nuclear Pre-Procedure Caffeine/Decaff Intake:  None > 12 hrs NPO After: 8:00pm   Lungs:  clear O2 Sat: 98% on room air. IV 0.9% NS with Angio Cath:  22g  IV Site: R Forearm x 1, tolerated well IV Started by:  Irven Baltimore, RN  Chest Size (in):  32 Cup Size: C  Height: 5\' 2"  (1.575 m)  Weight:  141 lb (63.957 kg)  BMI:  Body mass index is 25.78 kg/(m^2). Tech Comments:  No medications or insulin today. Fasting CBG was 126 at 0908 today.    Nuclear Med Study 1 or 2 day study: 1 day  Stress Test Type:  Stress  Reading MD: N/A  Order Authorizing Provider:  Fransico Him, MD  Resting Radionuclide: Technetium 36m Sestamibi  Resting Radionuclide Dose: 11.0 mCi   Stress Radionuclide:  Technetium 1m Sestamibi  Stress Radionuclide Dose: 33.0 mCi           Stress Protocol Rest HR: 98 Stress HR: 157  Rest BP: 140/74 Stress BP: 161/80  Exercise Time (min): 4:00 METS: 4.6           Dose of Adenosine (mg):  n/a Dose of Lexiscan: n/a mg  Dose of Atropine (mg): n/a Dose of Dobutamine: n/a mcg/kg/min (at max HR)  Stress Test Technologist: Glade Lloyd, BS-ES  Nuclear Technologist:  Charlton Amor, CNMT     Rest Procedure:  Myocardial perfusion imaging was performed at rest 45 minutes following the intravenous administration of Technetium 21m Sestamibi. Rest ECG: NSR - Normal EKG  Stress Procedure:  The patient exercised on the treadmill utilizing the Bruce  Protocol for 4:00 minutes. The patient stopped due to being very SOB, lightheaded and denied any chest pain.  Technetium 38m Sestamibi was injected at peak exercise and myocardial perfusion imaging was performed after a brief delay. Stress ECG: No significant change from baseline ECG  QPS Raw Data Images:  Normal; no motion artifact; normal heart/lung ratio. Stress Images:  Normal homogeneous uptake in all areas of the myocardium. Rest Images:  Normal homogeneous uptake in all areas of the myocardium. Subtraction (SDS):  No evidence of ischemia. Transient Ischemic Dilatation (Normal <1.22):  1.08 Lung/Heart Ratio (Normal <0.45):  0.31  Quantitative Gated Spect Images QGS EDV:  52 ml QGS ESV:  16 ml  Impression Exercise Capacity:  Fair exercise capacity. BP Response:  Normal blood pressure response. Clinical Symptoms:  There is dyspnea. ECG Impression:  No significant ST segment change suggestive of ischemia. Comparison with Prior Nuclear Study: No previous nuclear study performed  Overall Impression:  Normal stress nuclear study.  LV Ejection Fraction: 70%.  LV Wall Motion:  NL LV Function; NL Wall Motion  [name], [name] 07/12/2013

## 2013-07-26 ENCOUNTER — Encounter: Payer: Self-pay | Admitting: Cardiology

## 2013-07-26 ENCOUNTER — Ambulatory Visit (INDEPENDENT_AMBULATORY_CARE_PROVIDER_SITE_OTHER): Payer: Medicare Other | Admitting: Cardiology

## 2013-07-26 VITALS — BP 140/80 | HR 90 | Ht 62.0 in | Wt 147.8 lb

## 2013-07-26 DIAGNOSIS — I10 Essential (primary) hypertension: Secondary | ICD-10-CM

## 2013-07-26 DIAGNOSIS — I509 Heart failure, unspecified: Secondary | ICD-10-CM

## 2013-07-26 DIAGNOSIS — I5042 Chronic combined systolic (congestive) and diastolic (congestive) heart failure: Secondary | ICD-10-CM | POA: Insufficient documentation

## 2013-07-26 DIAGNOSIS — I5032 Chronic diastolic (congestive) heart failure: Secondary | ICD-10-CM

## 2013-07-26 DIAGNOSIS — I119 Hypertensive heart disease without heart failure: Secondary | ICD-10-CM

## 2013-07-26 MED ORDER — METOPROLOL SUCCINATE ER 50 MG PO TB24: 50.0000 mg | ORAL_TABLET | Freq: Every day | ORAL | Status: DC

## 2013-07-26 NOTE — Progress Notes (Signed)
[address], [place], [place]  [address] Phone: [phone] Fax:  [phone]  Date:  07/26/2013   ID:  [name], DOB 12/30/1955, MRN [id number]  PCP:  [name], DO  Cardiologist:  Fransico Him, MD     History of Present Illness: [name] is a 58 y.o. female with a history of DM, dyslipidemia, HTN and GERD who presents today for followup of chest pain. I recently saw her and she was complaining of CP episodes that occur about twice monthly and usually lasted constantly for a day and then the next day it has resolved. The pain was described as a sharp and pressure sensation that was exacerbated by any movement including deep breathing. She also had SOB with the pain. It was located over her left breast and into her left arm. She also complained of DOE when not having the pain and a fluttering in her chest. She occasionally has LE edema.  She underwent nuclear stress test that showed no ischemia and normal LVF with EF 70%.  2D echo showed normal LVF with marked LV hypertrophy and diastolic dysfunction.  Her Lisinopril was decreased and she was started on Toprol to decrease HR and increase diastolic filling time. She now presents back for followup.  Since going on the beta blocker her chest pain and SOB have resolved.  She has not had any LE edema either.  Her palpitations have resolved as well.     Wt Readings from Last 3 Encounters:  07/26/13 147 lb 12.8 oz (67.042 kg)  07/12/13 141 lb (63.957 kg)  06/22/13 142 lb (64.411 kg)     Past Medical History  Diagnosis Date  . DM (diabetes mellitus), type 2   . Hypercholesteremia   . HTN (hypertension)   . Trigger finger     r index inj 12/08, bilateral thumb inj 11/09, r thumb and dequervain's inj 01/2009  . Bipolar 2 disorder   . Marijuana abuse   . Anxiety   . Arthritis   . Depression   . GERD (gastroesophageal reflux disease)   . Chest pain     Current Outpatient Prescriptions  Medication  Sig Dispense Refill  . ARIPiprazole (ABILIFY) 10 MG tablet Take 10 mg by mouth daily.      [name] aspirin 81 MG tablet Take 1 tablet (81 mg total) by mouth daily.  30 tablet  11  . atorvastatin (LIPITOR) 40 MG tablet Take 1 tablet (40 mg total) by mouth daily.  90 tablet  3  . gabapentin (NEURONTIN) 100 MG capsule Take 100 mg by mouth 4 (four) times daily.      [name] glucose blood test strip Use as instructed  100 each  12  . insulin glargine (LANTUS) 100 UNIT/ML injection Inject 0.25 mLs (25 Units total) into the skin daily.  10 vial  5  . Lancets (ACCU-CHEK SOFT TOUCH) lancets Use as instructed  100 each  12  . Lancets Misc. MISC 1 Container by Does not apply route 3 (three) times daily before meals. Use for three times daily testing of CBGs. Dispense 1 month supply.  1 each  11  . lisinopril-hydrochlorothiazide (PRINZIDE,ZESTORETIC) 20-12.5 MG per tablet Take 1 tablet daily  30 tablet  6  . metoCLOPramide (REGLAN) 10 MG tablet Take 10 mg by mouth 3 (three) times daily.      . metoprolol succinate (TOPROL XL) 25 MG 24 hr tablet Take 1 tablet (25 mg total) by mouth daily.  30 tablet  6  . mirtazapine (REMERON) 15 MG tablet Take 1 tablet (15 mg total) by mouth at bedtime.  30 tablet  3  . nateglinide (STARLIX) 120 MG tablet Take 1 tablet (120 mg total) by mouth 3 (three) times daily before meals.  90 tablet  3  . nitroGLYCERIN (NITROSTAT) 0.4 MG SL tablet Place 1 tablet (0.4 mg total) under the tongue every 5 (five) minutes as needed for chest pain.  50 tablet  3  . QUEtiapine (SEROQUEL) 50 MG tablet Take 100 mg by mouth at bedtime.       No current facility-administered medications for this visit.    Allergies:    Allergies  Allergen Reactions  . Ibuprofen Other (See Comments)    REACTION: chest tightness and feeling like something stuck in chest    Social History:  The patient  reports that she has been smoking Cigarettes.  She has a 15 pack-year smoking history. She has never used smokeless  tobacco. She reports that she does not drink alcohol or use illicit drugs.   Family History:  The patient's family history includes Bipolar disorder in her brother, father, and sister; Diabetes in her mother; Heart attack in her father; Heart disease in her mother. There is no history of Colon cancer or Esophageal cancer.   ROS:  Please see the history of present illness.      All other systems reviewed and negative.   PHYSICAL EXAM: VS:  BP 140/80  Pulse 90  Ht 5\' 2"  (1.575 m)  Wt 147 lb 12.8 oz (67.042 kg)  BMI 27.03 kg/m2 Well nourished, well developed, in no acute distress HEENT: normal Neck: no JVD Cardiac:  normal S1, S2; RRR; no murmur Lungs:  clear to auscultation bilaterally, no wheezing, rhonchi or rales Abd: soft, nontender, no hepatomegaly Ext: no edema Skin: warm and dry Neuro:  CNs 2-12 intact, no focal abnormalities noted       ASSESSMENT AND PLAN:  1. Atypical CP with no ischemia on nuclear stress test.  Her CP and SOB have resolved with addition of beta blocker and I suspect her symptoms were from diastolic heart failure.  She has evidence of significant diastolic dysfunction and severe LVH on echo.   2. Diastolic Dysfunction with marked LVH and chronic diastolic CHF which is stable  - Her HR is still in the 90's and BP at higher end of normal. I have instructed her to increase her Toprol to 50mg  daily 3. HTN - controlled  - increase Toprol as stated above  - continue Lisinopril/HCT    Followup with nurse in 1 week for BP and HR check and with me in 3 months   Signed, Fransico Him, MD 07/26/2013 10:27 AM

## 2013-07-26 NOTE — Patient Instructions (Signed)
Your physician has recommended you make the following change in your medication: 1. Increase Toprol to 50 MG 1 tablet Daily  Your physician recommends that you schedule a follow-up appointment in: 1 week for BP and HR check with Nurse  Your physician recommends that you schedule a follow-up appointment in: 3 Months with Dr Radford Pax

## 2013-08-03 ENCOUNTER — Telehealth: Payer: Self-pay | Admitting: Cardiology

## 2013-08-03 NOTE — Telephone Encounter (Signed)
Please let patient know that heart monitor showed NSR with sinus tachycardia up to 132bpm.  She intermittently develops a rate dependent bundle branch block with complaints of dizziness at that same time.  Please set up an ETT with me to determine if she has symptoms that correlate with when she has a BBB

## 2013-08-04 ENCOUNTER — Encounter: Payer: Self-pay | Admitting: General Surgery

## 2013-08-04 NOTE — Telephone Encounter (Signed)
yes

## 2013-08-04 NOTE — Telephone Encounter (Signed)
Phone line is disconnected. To Dr Radford Pax are you ok with me writing a letter to the pt to call us and update phone number?

## 2013-08-04 NOTE — Telephone Encounter (Signed)
Letter sent to pt to call and give [place] a working number and to receive heart monitor results.

## 2013-08-10 ENCOUNTER — Ambulatory Visit (INDEPENDENT_AMBULATORY_CARE_PROVIDER_SITE_OTHER): Payer: Medicare Other | Admitting: *Deleted

## 2013-08-10 ENCOUNTER — Other Ambulatory Visit: Payer: Self-pay | Admitting: General Surgery

## 2013-08-10 VITALS — BP 118/62 | HR 95

## 2013-08-10 DIAGNOSIS — I10 Essential (primary) hypertension: Secondary | ICD-10-CM

## 2013-08-10 DIAGNOSIS — R079 Chest pain, unspecified: Secondary | ICD-10-CM

## 2013-08-10 NOTE — Progress Notes (Signed)
Pt is aware.  

## 2013-08-10 NOTE — Telephone Encounter (Signed)
Chest pain

## 2013-08-10 NOTE — Progress Notes (Signed)
BP much improved on higher dose of Toprol.  Continue current therapy.  She will need to hold Toprol for ETT

## 2013-08-10 NOTE — Progress Notes (Signed)
Pt ambulated without assist to triage room. Pt was here for BP evaluation for medication increase. Pt sat for 5 minutes, large manual cuff to left arm. Pt took Toprol 50 mg 1 hour ago. bp 118/62 p 95 bpm Pt was taken to lobby without incident.

## 2013-08-10 NOTE — Telephone Encounter (Signed)
Pt is aware. Dr Radford Pax what Dx Code would you like me to use?

## 2013-09-08 ENCOUNTER — Other Ambulatory Visit: Payer: Self-pay | Admitting: *Deleted

## 2013-09-08 MED ORDER — LISINOPRIL-HYDROCHLOROTHIAZIDE 20-12.5 MG PO TABS: ORAL_TABLET | ORAL | Status: DC

## 2013-09-14 ENCOUNTER — Ambulatory Visit (INDEPENDENT_AMBULATORY_CARE_PROVIDER_SITE_OTHER): Payer: Medicare Other | Admitting: Physician Assistant

## 2013-09-14 DIAGNOSIS — R079 Chest pain, unspecified: Secondary | ICD-10-CM

## 2013-09-14 NOTE — Progress Notes (Signed)
Exercise Treadmill Test  Pre-Exercise Testing Evaluation Rhythm: normal sinus  Rate: 89 bpm     Test  Exercise Tolerance Test Ordering MD: Fransico Him, MD  Interpreting MD: Richardson Dopp, PA-C  Unique Test No: 1  Treadmill:  1  Indication for ETT: chest pain - rule out ischemia  Contraindication to ETT: No   Stress Modality: exercise - treadmill  Cardiac Imaging Performed: non   Protocol: standard Bruce - maximal  Max BP:  224/79  Max MPHR (bpm):  162 85% MPR (bpm):  138  MPHR obtained (bpm):  150 % MPHR obtained:  92  Reached 85% MPHR (min:sec):  4:15 Total Exercise Time (min-sec):  5:00  Workload in METS:  6.9 Borg Scale: 17  Reason ETT Terminated:  patient's desire to stop    ST Segment Analysis At Rest: normal ST segments - no evidence of significant ST depression With Exercise: borderline ST changes  Other Information Arrhythmia:  No Angina during ETT:  present (1) Quality of ETT:  indeterminate  ETT Interpretation:  borderline (indeterminate) with non-specific ST changes  Comments: Poor exercise capacity. Patient did complain of chest pain in recovery.  Resolved at end of recovery period. She did complain of dyspnea early during exercise. Exaggerated hypertensive BP response to exercise (no BP meds yet today). There was 1 mm ST depression in inferolateral leads at peak exercise. QRS remained narrow throughout exercise. No ventricular arrhythmias.  Recommendations: F/u with Dr. Fransico Him as planned. Of note, patient had recent nuclear study that was negative for ischemia.  Signed,  Richardson Dopp, PA-C   09/14/2013 10:40 AM

## 2013-09-15 ENCOUNTER — Telehealth: Payer: Self-pay | Admitting: *Deleted

## 2013-09-15 NOTE — Progress Notes (Signed)
Patient had no ischemia on recent ETT.  She did have CP during the stress test but she had a hypertensive BP response to exercise due to holding metoprolol.  Please find out if she has had any further CP after going back on metoprolol after her ETT

## 2013-09-15 NOTE — Telephone Encounter (Signed)
Recommendations: F/u with Dr. Fransico Him as planned. Of note, patient had recent nuclear study that was negative for ischemia.   Signed,  Richardson Dopp, PA-C    09/14/2013 10:40 AM       Mercedes Margarita, MD at 09/15/2013 12:24 AM      Status: Signed            Patient had no ischemia on recent ETT.  She did have CP during the stress test but she had a hypertensive BP response to exercise due to holding metoprolol.  Please find out if she has had any further CP after going back on metoprolol after her ETT    Spoke with pt, she has had no further chest pain. She will cont on the present meds and follow up as scheduled.

## 2013-09-19 ENCOUNTER — Other Ambulatory Visit: Payer: Self-pay | Admitting: *Deleted

## 2013-09-20 MED ORDER — LISINOPRIL-HYDROCHLOROTHIAZIDE 20-12.5 MG PO TABS: ORAL_TABLET | ORAL | Status: DC

## 2013-09-22 ENCOUNTER — Encounter: Payer: Self-pay | Admitting: Emergency Medicine

## 2013-09-22 ENCOUNTER — Ambulatory Visit (INDEPENDENT_AMBULATORY_CARE_PROVIDER_SITE_OTHER): Payer: Medicare Other | Admitting: Emergency Medicine

## 2013-09-22 VITALS — BP 164/88 | HR 97 | Ht 62.0 in | Wt 150.3 lb

## 2013-09-22 DIAGNOSIS — E1165 Type 2 diabetes mellitus with hyperglycemia: Secondary | ICD-10-CM

## 2013-09-22 DIAGNOSIS — IMO0002 Reserved for concepts with insufficient information to code with codable children: Secondary | ICD-10-CM

## 2013-09-22 DIAGNOSIS — IMO0001 Reserved for inherently not codable concepts without codable children: Secondary | ICD-10-CM

## 2013-09-22 DIAGNOSIS — I10 Essential (primary) hypertension: Secondary | ICD-10-CM

## 2013-09-22 LAB — POCT GLYCOSYLATED HEMOGLOBIN (HGB A1C): Hemoglobin A1C: 8.8

## 2013-09-22 MED ORDER — ONETOUCH LANCETS MISC: 1.0000 | Freq: Three times a day (TID) | Status: AC

## 2013-09-22 MED ORDER — ONETOUCH ULTRA 2 W/DEVICE KIT: 1.0000 | PACK | Freq: Once | Status: DC

## 2013-09-22 MED ORDER — LISINOPRIL-HYDROCHLOROTHIAZIDE 20-12.5 MG PO TABS: 2.0000 | ORAL_TABLET | Freq: Every day | ORAL | Status: DC

## 2013-09-22 MED ORDER — GLUCOSE BLOOD VI STRP: ORAL_STRIP | Status: DC

## 2013-09-22 NOTE — Assessment & Plan Note (Signed)
Elevated, but has not been taking lisinopril-hctz as she doesn't have any. I sent a refill of lisinopril-hctz 20-12.5, take 2 daily to her pharmacy. F/u in 1 month.

## 2013-09-22 NOTE — Progress Notes (Signed)
   Subjective:    Patient ID: [name], female    DOB: [birth year], 58 y.o.   MRN: [id number]  HPI [name] is here for dm/htn f/u.  Diabetes Compliant with medications: yes - Lantus 25 units; only takes Starlix if eating a full meal Side effects from medications: no Check sugars at home: no - has been out of test strips for an old meter for the last few months.  Polyuria: no Polydipsia: no Vision changes: yes Hypoglycemic symptoms: no  Foot exam: done 05/2013 Eye exam: has appt later this month Microalbumin: n/a on ace-i  Hypertension Compliant with medication: no - has been out of lisinopril-hctz for the last 10 days; states pharmacy has been unable to contact us. Side effects from medication: no Check BP at home: no  Chest pain: no Palpitations: no Vision changes: yes Leg edema: no Dizziness: no   Current Outpatient Prescriptions on File Prior to Visit  Medication Sig Dispense Refill  . ARIPiprazole (ABILIFY) 10 MG tablet Take 10 mg by mouth daily.      [name] aspirin 81 MG tablet Take 1 tablet (81 mg total) by mouth daily.  30 tablet  11  . atorvastatin (LIPITOR) 40 MG tablet Take 1 tablet (40 mg total) by mouth daily.  90 tablet  3  . gabapentin (NEURONTIN) 100 MG capsule Take 100 mg by mouth 4 (four) times daily.      . insulin glargine (LANTUS) 100 UNIT/ML injection Inject 0.25 mLs (25 Units total) into the skin daily.  10 vial  5  . metoCLOPramide (REGLAN) 10 MG tablet Take 10 mg by mouth 3 (three) times daily.      . metoprolol succinate (TOPROL XL) 50 MG 24 hr tablet Take 1 tablet (50 mg total) by mouth daily.  30 tablet  6  . mirtazapine (REMERON) 15 MG tablet Take 1 tablet (15 mg total) by mouth at bedtime.  30 tablet  3  . nateglinide (STARLIX) 120 MG tablet Take 1 tablet (120 mg total) by mouth 3 (three) times daily before meals.  90 tablet  3  . nitroGLYCERIN (NITROSTAT) 0.4 MG SL tablet Place 1 tablet (0.4 mg total) under the tongue every 5 (five)  minutes as needed for chest pain.  50 tablet  3  . QUEtiapine (SEROQUEL) 50 MG tablet Take 100 mg by mouth at bedtime.       No current facility-administered medications on file prior to visit.    I have reviewed and updated the following as appropriate: allergies and current medications SHx: current smoker   Review of Systems See HPI    Objective:   Physical Exam BP 164/88  Pulse 97  Ht 5\' 2"  (1.575 m)  Wt 150 lb 4.8 oz (68.176 kg)  BMI 27.48 kg/m2 Gen: alert, cooperative, NAD HEENT: AT/[place], sclera white, MMM Neck: supple CV: RRR, no murmurs Pulm: CTAB, no wheezes or rales Ext: no edema Feet: bilateral calluses over 5th metatarsals      Assessment & Plan:

## 2013-09-22 NOTE — Assessment & Plan Note (Signed)
A1c 8.8 today. I did not adjust medications as she has been unable to test her blood sugar so I am not sure how best to proceed. I have sent in scripts for the medicare meter - onetouch ultra. F/u in 1 month with her blood sugar log for medication adjustment.

## 2013-09-22 NOTE — Patient Instructions (Signed)
It was nice to meet you!  I sent in your blood pressure medicine. I also sent in prescriptions for a new glucose meter and strips so you can start testing your blood sugar again. Please check your sugar first thing in the morning and 2 hours after a meal every day.   Come back in 1 month and bring your blood sugar log with you.

## 2013-09-23 ENCOUNTER — Ambulatory Visit: Payer: Medicare Other | Admitting: Family Medicine

## 2013-10-17 ENCOUNTER — Ambulatory Visit (INDEPENDENT_AMBULATORY_CARE_PROVIDER_SITE_OTHER): Payer: Medicare Other | Admitting: Family Medicine

## 2013-10-17 ENCOUNTER — Encounter: Payer: Self-pay | Admitting: Family Medicine

## 2013-10-17 VITALS — BP 156/84 | HR 94 | Ht 62.0 in | Wt 150.5 lb

## 2013-10-17 DIAGNOSIS — IMO0001 Reserved for inherently not codable concepts without codable children: Secondary | ICD-10-CM

## 2013-10-17 DIAGNOSIS — I5032 Chronic diastolic (congestive) heart failure: Secondary | ICD-10-CM

## 2013-10-17 DIAGNOSIS — I10 Essential (primary) hypertension: Secondary | ICD-10-CM

## 2013-10-17 DIAGNOSIS — E1165 Type 2 diabetes mellitus with hyperglycemia: Principal | ICD-10-CM

## 2013-10-17 DIAGNOSIS — I509 Heart failure, unspecified: Secondary | ICD-10-CM

## 2013-10-17 LAB — COMPREHENSIVE METABOLIC PANEL
ALT: 11 U/L (ref 0–35)
AST: 12 U/L (ref 0–37)
Albumin: 4.2 g/dL (ref 3.5–5.2)
Alkaline Phosphatase: 123 U/L — ABNORMAL HIGH (ref 39–117)
BILIRUBIN TOTAL: 0.5 mg/dL (ref 0.2–1.2)
BUN: 26 mg/dL — ABNORMAL HIGH (ref 6–23)
CO2: 31 mEq/L (ref 19–32)
CREATININE: 1.15 mg/dL — AB (ref 0.50–1.10)
Calcium: 10.1 mg/dL (ref 8.4–10.5)
Chloride: 101 mEq/L (ref 96–112)
Glucose, Bld: 172 mg/dL — ABNORMAL HIGH (ref 70–99)
Potassium: 4.3 mEq/L (ref 3.5–5.3)
Sodium: 139 mEq/L (ref 135–145)
Total Protein: 7.4 g/dL (ref 6.0–8.3)

## 2013-10-17 LAB — IRON: Iron: 76 ug/dL (ref 42–145)

## 2013-10-17 LAB — FERRITIN: Ferritin: 170 ng/mL (ref 10–291)

## 2013-10-17 MED ORDER — NATEGLINIDE 120 MG PO TABS: 120.0000 mg | ORAL_TABLET | Freq: Three times a day (TID) | ORAL | Status: DC

## 2013-10-17 MED ORDER — INSULIN GLARGINE 100 UNIT/ML ~~LOC~~ SOLN: 30.0000 [IU] | Freq: Every day | SUBCUTANEOUS | Status: DC

## 2013-10-17 NOTE — Patient Instructions (Signed)
Dear Mrs. Obenauf, Thank you for coming in to clinic today. It was good to see you again!  Today we discussed your Diabetes and Blood Pressure. 1. Thank you for recording all of your blood sugar readings. It is very helpful, and thanks for bringing your meter in. 2. Since your blood sugar readings are remaining high, I would like to make 2 changes: 3. Increase Lantus to 30 units daily. Do this for about 4 to 5 days, and if your readings are still elevated (>250) then you may start taking 35 units daily. 4. Refilled Starlix. Please take this even with small meals (sandwiches etc), only time you don't take it is if you don't eat at all, otherwise should be 3 times daily 5. Dietary Lifestyle Goals    - 3 days a week drink no soda / juice    - Change to diet drinks or low calorie    - Increase walking around circle, try to aim for 3 to 5 days a week  Some important numbers from today's visit: HgA1C - previously 8.8 BP - 156/84  We have collected blood for labs today, if there are any concerns we will notify you, otherwise you should receive a letter in the mail with your results. If you do not, then request a print out of your results at your next appointment.  Please schedule a follow-up appointment with me in 3 months for Diabetes.  If you have any other questions or concerns, please feel free to call the clinic to contact me. You may also schedule an earlier appointment if necessary.  However, if your symptoms get significantly worse, please go to the Emergency Department to seek immediate medical attention.  [name], [place]

## 2013-10-17 NOTE — Progress Notes (Signed)
Patient ID: [name], female   DOB: [birth year], 58 y.o.   MRN: [id number] Subjective:    HPI CHRONIC DM, Type 2: Reports - Using new glucometer, written CBG journal. Admits has not taken Starlix x 2 months, currently out (only takes with full meals, limited).  CBGs: Avg 272, Low 52 (shaky, weak), High 598 (no symptoms). Checks CBGs x3 daily (AM fasting, 2hr PP, PM) Meds: Lantus 25 daily in AM Reports good compliance. Tolerating well w/o side-effects Currently on ACEi Lifestyle: Diet (drinks 1-2 regular sodas / juice daily) / Exercise (walking 3x weekly) Denies hypoglycemia, polyuria, visual changes, numbness or tingling.  CHRONIC HTN: Reports - no concerns Current Meds - HCTZ-Lisinopril x 2 daily (20-12.5mg ), Metoprolol-XL 50mg  daily   Reports good compliance, took meds today. Tolerating well, w/o complaints. Denies CP, dyspnea, HA, edema, dizziness / lightheadedness  PMH: Continues to follow with [facility] for behavioral health  Social Hx: Current smoker continues to plan to cut down  Review of Systems  See above HPI.  Admits to intermittent sharp pain with numbness R>L foot, poor appetite (improving), occasional chest pain / pressure, DOE. Denies hypoglycemia, polyuria, visual changes, fever, chills, dizziness, lightheadedness, edema, abdominal pain, n/v.     Objective:   Physical Exam  BP 156/84  Pulse 94  Ht 5\' 2"  (1.575 m)  Wt 150 lb 8 oz (68.266 kg)  BMI 27.52 kg/m2  Gen - pleasant, well-appearing, NAD HEENT - MMM Heart - RRR, no murmurs heard Lungs - CTAB, no wheezing, crackles, or rhonchi. Normal work of breathing. Abd - soft, NTND, no masses, +active BS Ext - non-tender, no edema, peripheral pulses intact +2 b/l Neuro - awake, alert, oriented    Assessment:     See specific A&P problem list for details.      Plan:     See specific A&P problem list for details.

## 2013-10-17 NOTE — Assessment & Plan Note (Signed)
Clinically stable - Worked up by Cardiology with Nuclear stress / ECHO  Plan: 1. Continue current regimen with BB, Toprol-XL 50mg  daily 2. Check Iron and Ferritin for pharmacy research recommendations for pt with chronic CHF

## 2013-10-17 NOTE — Assessment & Plan Note (Addendum)
Recent worsening control, currently with high avg CBGs on written / glucometer review Current HgbA1c 8.8, inc from prior 7.7 No complications or hypoglycemia. - Followed by [facility] (last seen 11/09/13, retinal scan nml w/o DM retinopathy, requested records)  Plan:  1. Increase Lantus 25 to 30u daily, advised to check CBG if consistently >250 x 4-5 days then may increase to 35u daily. Continue to titrate by 2 units for CBGs >250 over 5 days, if at Lantus 40u then advised call clinic for f/u. 2. Refilled Starlix - advised patient on correct use, to take with meals TID (not to skip if she feels like she isn't eating enough, pt described adequate meals) 3. Lifestyle - Goals: switch to diet sodas/low cal juice, no juice/sodas 3 days a week, inc walking from 3x to 5x weekly

## 2013-10-17 NOTE — Assessment & Plan Note (Signed)
Improved control No complications   Plan:  1. Continue current BP meds.  2. Check CMET - last checked 08/2012

## 2013-10-24 ENCOUNTER — Ambulatory Visit: Payer: Medicare Other | Admitting: Cardiology

## 2013-12-22 ENCOUNTER — Other Ambulatory Visit: Payer: Self-pay | Admitting: Family Medicine

## 2013-12-22 DIAGNOSIS — I10 Essential (primary) hypertension: Secondary | ICD-10-CM

## 2013-12-22 MED ORDER — LISINOPRIL-HYDROCHLOROTHIAZIDE 20-12.5 MG PO TABS: 2.0000 | ORAL_TABLET | Freq: Every day | ORAL | Status: DC

## 2014-02-13 ENCOUNTER — Encounter: Payer: Self-pay | Admitting: Family Medicine

## 2014-02-13 ENCOUNTER — Ambulatory Visit (INDEPENDENT_AMBULATORY_CARE_PROVIDER_SITE_OTHER): Payer: Medicare Other | Admitting: Family Medicine

## 2014-02-13 VITALS — BP 134/77 | HR 98 | Temp 98.1°F | Ht 62.0 in | Wt 161.5 lb

## 2014-02-13 DIAGNOSIS — IMO0001 Reserved for inherently not codable concepts without codable children: Secondary | ICD-10-CM

## 2014-02-13 DIAGNOSIS — I10 Essential (primary) hypertension: Secondary | ICD-10-CM

## 2014-02-13 DIAGNOSIS — F17200 Nicotine dependence, unspecified, uncomplicated: Secondary | ICD-10-CM

## 2014-02-13 DIAGNOSIS — K5900 Constipation, unspecified: Secondary | ICD-10-CM | POA: Insufficient documentation

## 2014-02-13 DIAGNOSIS — IMO0002 Reserved for concepts with insufficient information to code with codable children: Secondary | ICD-10-CM

## 2014-02-13 DIAGNOSIS — E1165 Type 2 diabetes mellitus with hyperglycemia: Secondary | ICD-10-CM

## 2014-02-13 LAB — POCT GLYCOSYLATED HEMOGLOBIN (HGB A1C): Hemoglobin A1C: 9

## 2014-02-13 MED ORDER — GLYCERIN (LAXATIVE) 2 G RE SUPP: 1.0000 | Freq: Once | RECTAL | Status: DC | PRN

## 2014-02-13 MED ORDER — POLYETHYLENE GLYCOL 3350 17 GM/SCOOP PO POWD: 17.0000 g | Freq: Two times a day (BID) | ORAL | Status: DC | PRN

## 2014-02-13 MED ORDER — INSULIN GLARGINE 100 UNIT/ML ~~LOC~~ SOLN: 40.0000 [IU] | Freq: Every day | SUBCUTANEOUS | Status: DC

## 2014-02-13 NOTE — Assessment & Plan Note (Signed)
Continues to smoke, not ready to quit - Offered smoking cessation counseling

## 2014-02-13 NOTE — Patient Instructions (Addendum)
Dear Mrs. Utech, Thank you for coming in to clinic today. It was good to see you again!  Today we discussed your Diabetes and Blood Pressure. 1. Thank you for recording all of your blood sugar readings. It is very helpful, and thanks for bringing your meter in. 2. Since your blood sugar readings are remaining high, I would like to make 2 changes: 3. Increase Lantus to 40 units daily. Do this for about 4 to 5 days, and if your readings are still elevated (>250) then you may start taking 45, go up to 50 units, if still elevated sugar, please call the clinic.  Start taking Miralax - twice daily Start taking Metamucil daily according to package May use laxative as needed.  4. Dietary Lifestyle Goals    - 3 days a week drink no soda / juice    - Change to diet drinks or low calorie    - Increase walking around circle, try to aim for 3 to 5 days a week  Some important numbers from today's visit: HgA1C - 9.0   Please schedule a follow-up appointment with me in 1 to 2 weeks for constipation. Otherwise, return in 3 months for Diabetes.  If you develop abdominal pain or any other significant problems related to your constipation, please call our clinic or go directly to the Emergency Department.  If you have any other questions or concerns, please feel free to call the clinic to contact me. You may also schedule an earlier appointment if necessary.  However, if your symptoms get significantly worse, please go to the Emergency Department to seek immediate medical attention.  [name], DO [facility]   Constipation Constipation is when a person has fewer than three bowel movements a week, has difficulty having a bowel movement, or has stools that are dry, hard, or larger than normal. As people grow older, constipation is more common. If you try to fix constipation with medicines that make you have a bowel movement (laxatives), the problem may get worse. Long-term  laxative use may cause the muscles of the colon to become weak. A low-fiber diet, not taking in enough fluids, and taking certain medicines may make constipation worse.  CAUSES   Certain medicines, such as antidepressants, pain medicine, iron supplements, antacids, and water pills.   Certain diseases, such as diabetes, irritable bowel syndrome (IBS), thyroid disease, or depression.   Not drinking enough water.   Not eating enough fiber-rich foods.   Stress or travel.   Lack of physical activity or exercise.   Ignoring the urge to have a bowel movement.   Using laxatives too much.  SIGNS AND SYMPTOMS   Having fewer than three bowel movements a week.   Straining to have a bowel movement.   Having stools that are hard, dry, or larger than normal.   Feeling full or bloated.   Pain in the lower abdomen.   Not feeling relief after having a bowel movement.  DIAGNOSIS  Your health care provider will take a medical history and perform a physical exam. Further testing may be done for severe constipation. Some tests may include:  A barium enema X-ray to examine your rectum, colon, and, sometimes, your small intestine.   A sigmoidoscopy to examine your lower colon.   A colonoscopy to examine your entire colon. TREATMENT  Treatment will depend on the severity of your constipation and what is causing it. Some dietary treatments include drinking more fluids and eating more fiber-rich foods.  Lifestyle treatments may include regular exercise. If these diet and lifestyle recommendations do not help, your health care provider may recommend taking over-the-counter laxative medicines to help you have bowel movements. Prescription medicines may be prescribed if over-the-counter medicines do not work.  HOME CARE INSTRUCTIONS   Eat foods that have a lot of fiber, such as fruits, vegetables, whole grains, and beans.  Limit foods high in fat and processed sugars, such as french  fries, hamburgers, cookies, candies, and soda.   A fiber supplement may be added to your diet if you cannot get enough fiber from foods.   Drink enough fluids to keep your urine clear or pale yellow.   Exercise regularly or as directed by your health care provider.   Go to the restroom when you have the urge to go. Do not hold it.   Only take over-the-counter or prescription medicines as directed by your health care provider. Do not take other medicines for constipation without talking to your health care provider first.  Melvin IF:   You have bright red blood in your stool.   Your constipation lasts for more than 4 days or gets worse.   You have abdominal or rectal pain.   You have thin, pencil-like stools.   You have unexplained weight loss. MAKE SURE YOU:   Understand these instructions.  Will watch your condition.  Will get help right away if you are not doing well or get worse. Document Released: 02/29/2004 Document Revised: 06/07/2013 Document Reviewed: 03/14/2013 [facility] Patient Information 2015 [place], [place]. This information is not intended to replace advice given to you by your health care provider. Make sure you discuss any questions you have with your health care provider.

## 2014-02-13 NOTE — Progress Notes (Signed)
Patient ID: [name], female   DOB: 10/10/1955, 58 y.o.   MRN: [id number] Subjective:    HPI CHRONIC DM, Type 2: Reports - "Sugar is out of control this past month" due to "eating too much of everything" CBGs: Avg 250s, Low 40 (shaky, weak), High 518 (no symptoms). Checks CBGs x3 daily, brought in hand-written CBG record for review Meds: Self-titrated up Lantus to 40u daily Reports good compliance. Tolerating well w/o side-effects Currently on ACEi Lifestyle: Diet (admitted to poor diet for past month, plans to improve) / Exercise (decreased walking) Admits hypoglycemia Denies polyuria, visual changes, numbness or tingling.  ABDOMINAL BLOATING / CONSTIPATION: - Reports worsening bloating and constipation for past several months, chronic hx of infrequent BMs, currently can't recall last BM has been about week, has used laxatives in past to have regular BMs, previously with hard BMs - Admits to occasional intermittent lower left abdominal pain lasting few minutes at a time, does not seem to be associated to activity, eating, or position. - Tried eating greens, drinking coffee, prune juice, tried milk of magnesia - Previously on Miralax x 3 months, Colace for months without relief - Has not tried fiber supplement - Family hx with chronic constipation - Admits to passing gas - Denies constant abdominal pain, nausea, vomiting, fevers, bloody BM  CHRONIC HTN: Reports - no concerns Current Meds - HCTZ-Lisinopril x 2 daily (20-12.5mg ), Metoprolol-XL 50mg  daily   Reports good compliance, took meds today. Tolerating well, w/o complaints. Denies CP, dyspnea, HA, edema, dizziness / lightheadedness  PMH: - Continues to follow-up with [facility] for behavioral health, admits recently started new medication to reduce facial movement side-effects without significant relief  Social Hx: - Active smoker  Review of Systems  See above HPI.     Objective:   Physical Exam  BP 134/77  Pulse  98  Temp(Src) 98.1 F (36.7 C) (Oral)  Ht 5\' 2"  (1.575 m)  Wt 161 lb 8 oz (73.256 kg)  BMI 29.53 kg/m2  Gen - well-appearing, NAD HEENT - MMM Heart - RRR, no murmurs heard Lungs - CTAB, no wheezing, crackles, or rhonchi. Normal work of breathing. Abd - notably distended lower abdomen with mild firmness, palpable firm stool in left lower abdomen, non-tender to palpation, no rebound or guarding, +active BS throughout abd MSK - back non-tender to palpation Ext - non-tender, no edema, peripheral pulses intact +2 b/l Neuro - awake, alert, oriented, grossly non-focal, gait normal    Assessment:     See specific A&P problem list for details.      Plan:     See specific A&P problem list for details.

## 2014-02-13 NOTE — Assessment & Plan Note (Signed)
Acute on chronic worsening constipation, in setting of abdominal bloating / distention - No red flag symptoms, hard stool, non-bloody, hx trial multiple medicines, currently uses laxatives PRN - Last colonoscopy 2013 (polyps, recommend repeat 2016)  Plan: 1. Rx Miralax 17g BID x at least 2 weeks 2. Start Metamucil daily 3. Ordered Glycerin suppository PRN 4. May take OTC laxative if still unsuccessful 5. Advised hydration, may continue natural remedy with fruit juices, coffee etc. 6. Recommend RTC 1-2 weeks for re-evaluation if no improvement or no BM. May consider KUB

## 2014-02-13 NOTE — Assessment & Plan Note (Signed)
Remains poor control, mild worsening - likely attributed to poor adherence to DM diet and dec exercise Current HgbA1c 9.0 (02/13/14), last HgbA1c 8.8 (0000000) - complicated by hypoglycemia, peripheral neuropathy, DM gastroparesis  Plan:  1. Increase Lantus to 40u daily, advised on titrating up eventually to 50u, if still avg CBG >250s, would recommend call / RTC for re-evaluation 2. Lifestyle Mods - Encouraged improved DM diet (dec carbs, inc vegs), exercise - did not adhere to previous goals 3. RTC 3 months or sooner if no progress

## 2014-02-13 NOTE — Assessment & Plan Note (Signed)
Well-controlled HTN No complications   Plan:  1. Continue current BP meds.  2. Consider re-check BMET q 6 to 12 months for monitor Cr 3. Lifestyle Mods - Smoking cessation, Exercise 4. Monitor BP at home or at drug store occasionally

## 2014-02-21 ENCOUNTER — Other Ambulatory Visit: Payer: Self-pay | Admitting: *Deleted

## 2014-02-21 DIAGNOSIS — E785 Hyperlipidemia, unspecified: Secondary | ICD-10-CM

## 2014-02-21 MED ORDER — ATORVASTATIN CALCIUM 40 MG PO TABS: 40.0000 mg | ORAL_TABLET | Freq: Every day | ORAL | Status: DC

## 2014-03-03 ENCOUNTER — Other Ambulatory Visit: Payer: Self-pay | Admitting: *Deleted

## 2014-03-03 MED ORDER — METOPROLOL SUCCINATE ER 50 MG PO TB24: 50.0000 mg | ORAL_TABLET | Freq: Every day | ORAL | Status: DC

## 2014-03-21 ENCOUNTER — Ambulatory Visit (INDEPENDENT_AMBULATORY_CARE_PROVIDER_SITE_OTHER): Payer: Medicare Other | Admitting: Family Medicine

## 2014-03-21 ENCOUNTER — Encounter: Payer: Self-pay | Admitting: Family Medicine

## 2014-03-21 VITALS — BP 148/68 | HR 99 | Temp 98.1°F | Ht 62.0 in | Wt 164.6 lb

## 2014-03-21 DIAGNOSIS — K5900 Constipation, unspecified: Secondary | ICD-10-CM

## 2014-03-21 DIAGNOSIS — IMO0002 Reserved for concepts with insufficient information to code with codable children: Secondary | ICD-10-CM

## 2014-03-21 DIAGNOSIS — E1165 Type 2 diabetes mellitus with hyperglycemia: Secondary | ICD-10-CM

## 2014-03-21 DIAGNOSIS — H269 Unspecified cataract: Secondary | ICD-10-CM

## 2014-03-21 DIAGNOSIS — Z23 Encounter for immunization: Secondary | ICD-10-CM

## 2014-03-21 DIAGNOSIS — I10 Essential (primary) hypertension: Secondary | ICD-10-CM

## 2014-03-21 DIAGNOSIS — E1143 Type 2 diabetes mellitus with diabetic autonomic (poly)neuropathy: Secondary | ICD-10-CM

## 2014-03-21 NOTE — Assessment & Plan Note (Signed)
Well-controlled HTN No complications   Plan:  1. Continue current BP meds.  2. Consider re-check BMET q 6 to 12 months for monitor Cr

## 2014-03-21 NOTE — Assessment & Plan Note (Signed)
Improved on Gabapentin  Plan: 1. Advised to increase Gabapentin to 200mg  TID as needed

## 2014-03-21 NOTE — Assessment & Plan Note (Signed)
Significant improvement, currently resolved since starting Miralax / Metamucil - Chronic hx constipation without red flag symptoms  Plan: 1. Continue Metamucil daily for fiber supplement 2. Reduce Miralax to 17g daily for 1 week, then may start PRN dosing. Can use previously rx Glycerin suppository PRN severe constipation 3. RTC PRN

## 2014-03-21 NOTE — Assessment & Plan Note (Signed)
Chronic history of bilateral cataracts, suspected due to poorly controlled DM, worsening within 1 year - Currently followed by Ophthalmology [facility]) last seen 06/2013, previously discussed cataract removal surgery, however pt unable to follow-up due to copay and travel cost  Plan: 1. Requested Ophthalmology referral to local clinic in [facility] for evaluation of cataracts and discussion of potential surgery, previously seen by Dr. Gershon Crane, encouraged pt to call office and try to schedule follow-up as well

## 2014-03-21 NOTE — Progress Notes (Signed)
Patient ID: [name], female   DOB: [birth year], 58 y.o.   MRN: [id number]  Subjective:    HPI CHRONIC DM, Type 2: Reports - Eating more regularly, admits to occasional sweet binge, eats small regular meals, no longer taking Starlix due to compliance with meals CBGs: Avg 180, Low 58 (shaky, weak, sweating), High 320 (no symptoms). Checks CBGs x3 daily, brought in hand-written CBG record for review Meds: Lantus to 40u daily Reports good compliance. Tolerating well w/o side-effects Currently on ACEi Lifestyle: Diet (improved diet) / Exercise (no regular exercise, stays active while at home) Admits hypoglycemia, vision changes with gradual worsening Denies polyuria, numbness or tingling.  Cataracts, Bilateral - Followed by Ophthalmology [facility], last seen 06/2013, sent by optometrist at [facility]) however patient requesting to be seen locally, previously seen by Dr. Gershon Crane - States that she told she was told "no DM eye problems", but diagnosed with bilateral cataracts, also plans for future cataract surgery, however cancelled recent appointment due to copay $50 and travel cost to [facility] - Currently with blurry vision, chronic but gradually worsening. Wears reading glasses, not significantly affecting ADLs / function, but difficult when outside in sunlight or night with lights causing glare - Denies any loss of vision, eye pain, headaches  ABDOMINAL BLOATING / CONSTIPATION: - Significantly improved, last visit (02/13/14) followed recommendations and started taking Metamucil and Miralax, now with regular loose BMs, feeling much better after regular BMs - Denies constipation, bloating, abdominal pain, nausea, vomiting, fevers, bloody BM  CHRONIC HTN: Reports - no concerns  Current Meds - HCTZ-Lisinopril x 2 daily (20-12.5mg ), Metoprolol-XL 50mg  daily   Reports good compliance, took meds today. Tolerating well, w/o complaints. Denies CP, dyspnea, HA, edema, dizziness /  lightheadedness  Social Hx: - Active smoker  Review of Systems  See above HPI.     Objective:   Physical Exam  BP 148/68  Pulse 99  Temp(Src) 98.1 F (36.7 C) (Oral)  Ht 5\' 2"  (1.575 m)  Wt 164 lb 9.6 oz (74.662 kg)  BMI 30.10 kg/m2  Gen - well-appearing, pleasant, NAD HEENT - Eyes with bilateral corneal haziness consistent with cataracts, decreased pupillary response to light bilaterally, symmetrical, MMM Heart - RRR, no murmurs heard Lungs - CTAB, no wheezing, crackles, or rhonchi. Normal work of breathing. Abd - soft, NTND significantly improved since last visit (previously with mild distention and palpable firm stool), +active BS throughout abd Ext - non-tender, no edema, peripheral pulses intact +2 b/l Neuro - awake, alert, grossly non-focal, intact distal sensation to light touch, gait normal  DM Foot Exam - Right > Left foot with decreased sensation to monofilament testing, bilateral medial chronic callus, no ulcers or other deformities     Assessment:     See specific A&P problem list for details.      Plan:     See specific A&P problem list for details.

## 2014-03-21 NOTE — Patient Instructions (Signed)
Dear [name], Thank you for coming in to clinic today. It was good to see you again!  Today we discussed your Diabetes, Cataracts, Constipation, Blood Pressure. 1. It looks like your Blood Sugar is doing much better. Continue Lantus 40 daily, if your readings are persistently still elevated (>250) then you may go up by 2 units every 1 week, stop at 44 units, if still elevated sugar, please call the clinic. 2. Continue taking Metamucil daily for fiber 3. Decrease Miralax to 1 time daily - for 1 more week, then switch to as needed only 4. Dietary Lifestyle Goals    - 3 days a week drink no soda / juice    - Change to diet drinks or low calorie    - Try to work on regular exercise with walking  We will place an Ophthalmology referral today for Cataracts, it looks like you need these removed. If we are unable to get you an appointment with local Eye Doctor, you may need to return to [facility] and schedule the surgery.  Some important numbers from today's visit: HgA1C - due in 2 months  Please schedule follow-up in 2 months for Diabetes - re-check A1c.  If you have any other questions or concerns, please feel free to call the clinic to contact me. You may also schedule an earlier appointment if necessary.  However, if your symptoms get significantly worse, please go to the Emergency Department to seek immediate medical attention.  [name], DO [facility]   Cataract A cataract is a clouding of the lens of the eye. When a lens becomes cloudy, vision is reduced based on the degree and nature of the clouding. Many cataracts reduce vision to some degree. Some cataracts make people more near-sighted as they develop. Other cataracts increase glare. Cataracts that are ignored and become worse can sometimes look white. The white color can be seen through the pupil. CAUSES   Aging. However, cataracts may occur at any age, even in newborns.  Certain  drugs.  Trauma to the eye.  Certain diseases such as diabetes.  Specific eye diseases such as chronic inflammation inside the eye or a sudden attack of a rare form of glaucoma.  Inherited or acquired medical problems. SYMPTOMS   Gradual, progressive drop in vision in the affected eye.  Severe, rapid visual loss. This most often happens when trauma is the cause. DIAGNOSIS  To detect a cataract, an eye doctor examines the lens. Cataracts are best diagnosed with an exam of the eyes with the pupils enlarged (dilated) by drops.  TREATMENT  For an early cataract, vision may improve by using different eyeglasses or stronger lighting. If that does not help your vision, surgery is the only effective treatment. A cataract needs to be surgically removed when vision loss interferes with your everyday activities, such as driving, reading, or watching TV. A cataract may also have to be removed if it prevents examination or treatment of another eye problem. Surgery removes the cloudy lens and usually replaces it with a substitute lens (intraocular lens, IOL).  At a time when both you and your doctor agree, the cataract will be surgically removed. If you have cataracts in both eyes, only one is usually removed at a time. This allows the operated eye to heal and be out of danger from any possible problems after surgery (such as infection or poor wound healing). In rare cases, a cataract may be doing damage to your eye. In these  cases, your caregiver may advise surgical removal right away. The vast majority of people who have cataract surgery have better vision afterward. HOME CARE INSTRUCTIONS  If you are not planning surgery, you may be asked to do the following:  Use different eyeglasses.  Use stronger or brighter lighting.  Ask your eye doctor about reducing your medicine dose or changing medicines if it is thought that a medicine caused your cataract. Changing medicines does not make the cataract go  away on its own.  Become familiar with your surroundings. Poor vision can lead to injury. Avoid bumping into things on the affected side. You are at a higher risk for tripping or falling.  Exercise extreme care when driving or operating machinery.  Wear sunglasses if you are sensitive to bright light or experiencing problems with glare. SEEK IMMEDIATE MEDICAL CARE IF:   You have a worsening or sudden vision loss.  You notice redness, swelling, or increasing pain in the eye.  You have a fever. Document Released: 06/02/2005 Document Revised: 08/25/2011 Document Reviewed: 01/24/2011 [facility] Patient Information 2015 [place], [place]. This information is not intended to replace advice given to you by your health care provider. Make sure you discuss any questions you have with your health care provider.

## 2014-03-21 NOTE — Assessment & Plan Note (Addendum)
Improved control since last visit - improved diet Last HgbA1c 9.0 (123XX123) - complicated by hypoglycemia, cataracts, peripheral neuropathy, DM gastroparesis - DM Foot exam (03/21/14), last Eye Exam 2015  Plan:  1. Continue Lantus to 40u daily, advised on titrating up if worsening with avg CBG >250s 2. Discontinued Starlix due to patient non-adherence 3. Lifestyle Mods - Encouraged improved DM diet (dec carbs, inc vegs), exercise - did not adhere to previous goals 4. Received influenza vaccine and prevnar today 5. RTC 2 month for A1c

## 2014-04-10 ENCOUNTER — Other Ambulatory Visit: Payer: Self-pay

## 2014-04-10 MED ORDER — METOPROLOL SUCCINATE ER 50 MG PO TB24: 50.0000 mg | ORAL_TABLET | Freq: Every day | ORAL | Status: DC

## 2014-05-10 ENCOUNTER — Other Ambulatory Visit: Payer: Self-pay | Admitting: Cardiology

## 2014-05-24 ENCOUNTER — Encounter (HOSPITAL_COMMUNITY): Payer: Self-pay | Admitting: Cardiology

## 2014-06-02 ENCOUNTER — Ambulatory Visit: Payer: Medicare Other | Admitting: Family Medicine

## 2014-06-19 ENCOUNTER — Ambulatory Visit (INDEPENDENT_AMBULATORY_CARE_PROVIDER_SITE_OTHER): Payer: 59 | Admitting: Family Medicine

## 2014-06-19 ENCOUNTER — Encounter: Payer: Self-pay | Admitting: Family Medicine

## 2014-06-19 VITALS — BP 149/100 | HR 88 | Temp 98.4°F | Ht 62.0 in | Wt 164.6 lb

## 2014-06-19 DIAGNOSIS — G2401 Drug induced subacute dyskinesia: Secondary | ICD-10-CM

## 2014-06-19 DIAGNOSIS — H269 Unspecified cataract: Secondary | ICD-10-CM

## 2014-06-19 DIAGNOSIS — I10 Essential (primary) hypertension: Secondary | ICD-10-CM

## 2014-06-19 DIAGNOSIS — E1165 Type 2 diabetes mellitus with hyperglycemia: Secondary | ICD-10-CM

## 2014-06-19 DIAGNOSIS — IMO0002 Reserved for concepts with insufficient information to code with codable children: Secondary | ICD-10-CM

## 2014-06-19 LAB — POCT GLYCOSYLATED HEMOGLOBIN (HGB A1C): Hemoglobin A1C: 8.4

## 2014-06-19 NOTE — Assessment & Plan Note (Signed)
Clinically consistent with tardive dyskinesia on exam, known chronic hx mild but seems to be worsening recently - Currently on Abilify and Seroquel, previously on Reglan (since discontinued several months ago), psych meds per [facility] with regular f/u  Plan: 1. Continue current dosing change per recent [facility] visit (divided one of psych med into half AM and half PM), advised to call back [facility] with information regarding which med name and dosing 2. Advised to continue f/u with [facility], next visit January 2016

## 2014-06-19 NOTE — Assessment & Plan Note (Addendum)
Elevated BP, mainly DBP. Re-check manual BP 140/95 mild improvement No complications  Plan: 1. Continue current BP meds 2. Re-check BMET 10/2014 3. RTC 1-3 months for BP re-check, consider inc metoprolol or starting CCB

## 2014-06-19 NOTE — Patient Instructions (Addendum)
Dear Mrs. Kissinger, Thank you for coming in to clinic today. It was good to see you again!  1. It looks like your Blood Sugar is doing much better. Continue Lantus 40 daily, if your readings are persistently still elevated (>250) then you may go up by 2 units every 1 week, stop at 44 units, if still elevated sugar, please call the clinic. 2. Continue taking Metamucil daily for fiber 3. For your mouth twitching - I think that this is from one of your Behavioral Health meds - please discuss this with your doctors at [facility] - call us with the name of the medicines and what changes were made. 4. Dietary Lifestyle Goals    - 3 days a week drink no soda / juice    - Change to diet drinks or low calorie    - Try to work on regular exercise with walking  For your Cataracts - try to return to [facility] once you have the copay to proceed - if you need a new referral, please call our clinic to let us know  Some important numbers from today's visit: HgA1C - 8.4 improved from 9  Please schedule follow-up in 3 months for Diabetes - re-check A1c.  Please schedule a future visit for a pap smear only.  Call to schedule a Mammogram (see handout for details)  If you have any other questions or concerns, please feel free to call the clinic to contact me. You may also schedule an earlier appointment if necessary.  However, if your symptoms get significantly worse, please go to the Emergency Department to seek immediate medical attention.  [name], [place]

## 2014-06-19 NOTE — Assessment & Plan Note (Signed)
Improved control DM A1c today 8.4,  (Last HgbA1c 9.0, 123XX123) - complicated by hypoglycemia, cataracts, peripheral neuropathy, DM gastroparesis - DM Foot exam (03/21/14), last Eye Exam 2015  Plan:  1. Continue Lantus to 40u daily, advised on titrating up if worsening with avg CBG >250s 2. Lifestyle Mods - Encouraged improved DM diet (dec carbs, inc vegs), walking exercise 3. RTC 3 month for A1c

## 2014-06-19 NOTE — Progress Notes (Signed)
Patient ID: [name], female   DOB: 09-Mar-1956, 59 y.o.   MRN: [id number]  Subjective:    HPI CHRONIC DM, Type 2: Reports - Continues on improved diet (smaller regular meals + snacks), reducing sweets, CBGs: Avg 160, Low 69 (+symptoms), High 353 (asymptomatic). Checks CBGs x3 daily, brought in hand-written CBG record for review Meds: Lantus to 40u daily Reports good compliance. Tolerating well w/o side-effects Currently on ACEi Lifestyle: Diet (improved diet) / Exercise (no regular exercise, stays active while at home) Admits hypoglycemia, vision changes with gradual worsening Denies polyuria, numbness or tingling.  CHRONIC HTN: Reports - no concerns  Current Meds - HCTZ-Lisinopril x 2 daily (20-12.5mg ), Metoprolol-XL 50mg  daily   Reports good compliance, took meds today. Tolerating well, w/o complaints. Denies CP, dyspnea, HA, edema, dizziness / lightheadedness  Cataracts, Bilateral - Last [facility] visit 03/2014 requested ambulatory ophthalmology referral, previously followed by [facility] (no longer able to f/u due to cost and travel), plan to return to Dr. Kellie Moor office, however recently had appointment scheduled but unable to afford $50 copay - Today asking if alternative eye clinic for cataracts - Continues with blurry vision, without acute worsening since last visit - Denies any loss of vision, eye pain, headaches  MOUTH TWITCHING / TARDIVE DYSKINESIA: - Reports symptoms started about 2-3 months ago, seems to be getting worse, described as "twitching of mouth while talking", and seems like "chewing on something, mouth is always moving". Reports that this is a chronic problem for years but seemed to be worse recently.  - Followed at [facility] for [facility], last seen 2 weeks ago, advised to make one medication change to either Abilify or Seroquel (unknown which one, but advised to take dose half in AM and half in PM) for this problem. - Currently off of Reglan (no longer  takes for past 2-4 months) previously took prior to eating for dysphagia - Scheduled to f/u at [facility] January 2016  Social Hx: - Active smoker  Review of Systems  See above HPI.     Objective:   Physical Exam  BP 149/100 mmHg  Pulse 88  Temp(Src) 98.4 F (36.9 C) (Oral)  Ht 5\' 2"  (1.575 m)  Wt 164 lb 9.6 oz (74.662 kg)  BMI 30.10 kg/m2  Gen - well-appearing, NAD HEENT - EOMI, Eyes with bilateral corneal haziness consistent with cataracts, symmetrical, MMM Heart - RRR, no murmurs heard Ext - non-tender, no edema, peripheral pulses intact +2 b/l Neuro - awake, alert, grossly non-focal. Mouth / lips with constant movements during and in-between speech, no other movements of limbs or tremors     Assessment:     See specific A&P problem list for details.      Plan:     See specific A&P problem list for details.

## 2014-06-19 NOTE — Assessment & Plan Note (Signed)
Stable, chronic b/l cataracts likely due to DM  Plan: 1. Advised f/u with Dr. Gershon Crane ophthalmology once able to afford copay (last referral made 03/2014), advised contact [facility] if new referral needed

## 2014-06-26 ENCOUNTER — Encounter: Payer: Self-pay | Admitting: Internal Medicine

## 2014-08-04 ENCOUNTER — Encounter: Payer: Self-pay | Admitting: Family Medicine

## 2014-08-10 ENCOUNTER — Telehealth: Payer: Self-pay | Admitting: Family Medicine

## 2014-08-10 DIAGNOSIS — E1165 Type 2 diabetes mellitus with hyperglycemia: Secondary | ICD-10-CM

## 2014-08-10 DIAGNOSIS — IMO0002 Reserved for concepts with insufficient information to code with codable children: Secondary | ICD-10-CM

## 2014-08-10 NOTE — Telephone Encounter (Signed)
Last OV 06/2014. Called patient to advise follow-up for DM and due for labs (fasting Lipid panel vs direct LDL). Talked to patient, [name], she understands plan, and will call [facility] soon to schedule "Lab Only" appointment, understands to be fasting for this blood draw. Future order placed for Lipid Panel.  [name], [place], PGY-2

## 2014-08-14 ENCOUNTER — Other Ambulatory Visit: Payer: 59

## 2014-08-18 ENCOUNTER — Other Ambulatory Visit: Payer: Medicare Other

## 2014-08-18 DIAGNOSIS — E1165 Type 2 diabetes mellitus with hyperglycemia: Secondary | ICD-10-CM

## 2014-08-18 DIAGNOSIS — IMO0002 Reserved for concepts with insufficient information to code with codable children: Secondary | ICD-10-CM

## 2014-08-18 NOTE — Progress Notes (Signed)
[facility] phlebotomist drew:  LIPID PANEL

## 2014-08-19 LAB — LIPID PANEL
CHOLESTEROL: 153 mg/dL (ref 0–200)
HDL: 42 mg/dL — AB (ref >=46)
LDL Cholesterol: 90 mg/dL (ref 0–99)
Total CHOL/HDL Ratio: 3.6 Ratio
Triglycerides: 106 mg/dL (ref <150)
VLDL: 21 mg/dL (ref 0–40)

## 2014-08-21 ENCOUNTER — Encounter: Payer: Self-pay | Admitting: Family Medicine

## 2014-08-21 ENCOUNTER — Ambulatory Visit (INDEPENDENT_AMBULATORY_CARE_PROVIDER_SITE_OTHER): Payer: Medicare Other | Admitting: Family Medicine

## 2014-08-21 VITALS — BP 160/87 | HR 106 | Ht 62.0 in | Wt 156.0 lb

## 2014-08-21 DIAGNOSIS — M545 Low back pain, unspecified: Secondary | ICD-10-CM

## 2014-08-21 DIAGNOSIS — IMO0002 Reserved for concepts with insufficient information to code with codable children: Secondary | ICD-10-CM

## 2014-08-21 DIAGNOSIS — E1165 Type 2 diabetes mellitus with hyperglycemia: Secondary | ICD-10-CM

## 2014-08-21 DIAGNOSIS — I10 Essential (primary) hypertension: Secondary | ICD-10-CM

## 2014-08-21 DIAGNOSIS — E785 Hyperlipidemia, unspecified: Secondary | ICD-10-CM

## 2014-08-21 MED ORDER — CYCLOBENZAPRINE HCL 10 MG PO TABS: 10.0000 mg | ORAL_TABLET | Freq: Three times a day (TID) | ORAL | Status: DC | PRN

## 2014-08-21 NOTE — Assessment & Plan Note (Signed)
Acute on chronic LBP, seems MSK like previous similar pains. No inciting injury or trauma. Likely related to recent acute illness, hospitalization, frequent vomiting / retching. - No back pain red flags. No radicular pain on exam.  Plan: 1. Reassurance, may take weeks to months for improvement if MSK injury 2. Start Flexeril 10mg  tabs - take 5mg  qhs and inc to 5-10mg  TID PRN 3. Conservative therapy with Tylenol scheduled, heating pad, stretches 4. RTC 1-2 months re-evaluation, consider PT

## 2014-08-21 NOTE — Patient Instructions (Signed)
Dear [name], Thank you for coming in to clinic today. Sorry that you were in the hospital recently.  1. For your Back Pain - we will start muscle relaxant Flexeril take half tablet (5mg ) 3 times daily (every 8 hours) for next few days - if not helping you may go to whole tablet (10mg ) every 8 hours - take this for 1 week, then switch to only as needed. - Start Tylenol Extra Strength 500mg  (take 2 tablets every 6 hours) every day for next 1-2 weeks then take as needed - Try Heating Pad or warm moist towel on your back for relief (do this 3 to 4 times a day) 2. Continue your medicines as prescribed - if you finish the stomach acid medicine and determine you need more just call 3. No change to insulin or blood pressure meds today 4. Your Cholesterol looks great - your Atorvastatin medicine is working well - keep up the good work  Some important numbers from today's visit: BP - 160/88 Results -  Please schedule a follow-up appointment with Dr. Parks Ranger in 2 months for follow-up back pain and Diabetes - (or sooner as needed)  If you have any other questions or concerns, please feel free to call the clinic to contact me. You may also schedule an earlier appointment if necessary.  However, if your symptoms get significantly worse, please go to the Emergency Department to seek immediate medical attention.  [name], [place]

## 2014-08-21 NOTE — Assessment & Plan Note (Signed)
Lipids well controlled on Atorvastatin 40mg  daily (high intensity statin) - Last fasting lipid panel 08/18/14: TC 153, TG 106, HDL 42, LDL 90  Plan: 1. Continue Atorvastatin 40mg  daily 2. Monitor lipids annually

## 2014-08-21 NOTE — Assessment & Plan Note (Signed)
Elevated BP. Likely due to to acute pain with low back. No complications  Plan: 1. Continue current BP meds 2. Not due for BMET. Last Cr 1.15 (10/2013) 3. RTC 1 month re-check BP, suspect LBP improved, if BP remains elevated consider likely start CCB vs inc metoprolol

## 2014-08-21 NOTE — Assessment & Plan Note (Signed)
Recent hospitalization for DKA due to out of insulin (unable to fill due to cost, had refills available)  Plan: 1. Has insulin currently with refills 2. Advised pt to call clinic or come in for insulin sample if completely out and unable to afford 3. RTC 1-2 months for next A1c

## 2014-08-21 NOTE — Progress Notes (Signed)
   Subjective:    Patient ID: [name], female    DOB: 02-07-1956, 59 y.o.   MRN: [id number]  HPI  HOSPITAL FOLLOW-UP - Recently hospitalized from 2/26 to 2/29 at [facility] for DKA, stated she ran out of all insulin for 3 days prior (unable to afford, had refills) and progressively got more sick with abd pain, nausea / vomiting. She stated that now that she has her insulin, she is doing much better. Resolved abdominal pain, improved nausea. Tolerating PO well.  LOW BACK PAIN, ACUTE ON CHRONIC: - History of prior low back pain from prior injuries - Stated she woke up on Friday with acute lower back pain bilateral with some radiations to groin. Worse with bending forward and ambulation, eased with a pillow against back. Not tried Tylenol or NSAIDs. Not tried heat or ice - Denies any fall or injury - Denies any dysuria, weakness, numbness or tingling, saddle anesthesia, urinary incontinence or retention  CHRONIC HTN: Reports - no concerns. Currently in significant pain today. Current Meds - HCTZ-Lisinopril x 2 daily (20-12.5mg ), Metoprolol-XL 50mg  daily  Reports good compliance, took meds today. Tolerating well, w/o complaints. Denies CP, dyspnea, HA, edema, dizziness / lightheadedness  HLD: - History of DM2 and HLD. As requested had patient come in for fasting lipid panel last week. - Last lipid panel 08/18/14 - Currently on Atorvastatin 40mg  daily without concerns  I have reviewed and updated the following as appropriate: allergies and current medications  Social Hx: Active smoker  Review of Systems  See above HPI    Objective:   Physical Exam  BP 160/87 mmHg  Pulse 106  Ht 5\' 2"  (1.575 m)  Wt 156 lb (70.761 kg)  BMI 28.53 kg/m2  Gen - well-appearing, NAD HEENT - oropharynx clear, MMM Heart - RRR, no murmurs heard Lungs - CTAB, no wheezing, crackles, or rhonchi. Non labored Abd - soft, NTND, no masses, +active BS MSK - Back: bilateral lower lumbar  paraspinal muscle +TTP with mild L>R hypertonicity and spasm, seated SLR negative for radicular symptoms Ext - non-tender, no edema, peripheral pulses intact +2 b/l Skin - warm, dry, no rashes Neuro - awake, alert, oriented, grossly non-focal, intact muscle strength 5/5 b/l knee flex / ext, ankle dorsiflex, intact distal sensation to light touch distal ext, gait normal     Assessment & Plan:   See specific A&P problem list for details.

## 2014-09-06 ENCOUNTER — Other Ambulatory Visit: Payer: Self-pay | Admitting: *Deleted

## 2014-09-06 DIAGNOSIS — I10 Essential (primary) hypertension: Secondary | ICD-10-CM

## 2014-09-06 MED ORDER — METOPROLOL SUCCINATE ER 50 MG PO TB24
50.0000 mg | ORAL_TABLET | Freq: Every day | ORAL | Status: DC
Start: 2014-09-06 — End: 2014-09-07

## 2014-09-07 ENCOUNTER — Telehealth: Payer: Self-pay | Admitting: Family Medicine

## 2014-09-07 ENCOUNTER — Other Ambulatory Visit: Payer: Self-pay | Admitting: *Deleted

## 2014-09-07 DIAGNOSIS — I10 Essential (primary) hypertension: Secondary | ICD-10-CM

## 2014-09-07 DIAGNOSIS — E119 Type 2 diabetes mellitus without complications: Secondary | ICD-10-CM

## 2014-09-07 NOTE — Telephone Encounter (Signed)
Pt is calling back to get her test results. She said that the doctor called her yesterday. jw

## 2014-09-11 MED ORDER — INSULIN GLARGINE 100 UNIT/ML ~~LOC~~ SOLN: 40.0000 [IU] | Freq: Every day | SUBCUTANEOUS | Status: DC

## 2014-09-11 MED ORDER — METOPROLOL SUCCINATE ER 50 MG PO TB24: 50.0000 mg | ORAL_TABLET | Freq: Every day | ORAL | Status: DC

## 2014-09-13 ENCOUNTER — Other Ambulatory Visit: Payer: Self-pay | Admitting: *Deleted

## 2014-09-14 NOTE — Telephone Encounter (Signed)
Called patient back. Spoke to husband, he had called in reference to my phone call last week asking [name] to get her mammogram screening completed. He had forgotten about this and called to find out how to proceed. I advised to call and schedule apt through [facility] - reference letter already mailed to patient.  [name], [place], PGY-2

## 2014-10-03 ENCOUNTER — Telehealth: Payer: Self-pay | Admitting: *Deleted

## 2014-10-03 ENCOUNTER — Encounter: Payer: Self-pay | Admitting: Family Medicine

## 2014-10-03 LAB — HEMOGLOBIN A1C: HEMOGLOBIN A1C: 8.1

## 2014-10-03 NOTE — Telephone Encounter (Signed)
Nurse Practitioner last name Sonie from [employer] called to report pt's A1c checked at the home.  A1c was 8.1.  If you have questions please give her a call at [phone].

## 2014-10-11 ENCOUNTER — Ambulatory Visit (INDEPENDENT_AMBULATORY_CARE_PROVIDER_SITE_OTHER): Payer: Medicare Other | Admitting: Family Medicine

## 2014-10-11 ENCOUNTER — Encounter: Payer: Self-pay | Admitting: Family Medicine

## 2014-10-11 VITALS — BP 126/79 | HR 92 | Temp 98.1°F | Ht 62.0 in | Wt 154.7 lb

## 2014-10-11 DIAGNOSIS — M545 Low back pain, unspecified: Secondary | ICD-10-CM

## 2014-10-11 DIAGNOSIS — E1165 Type 2 diabetes mellitus with hyperglycemia: Secondary | ICD-10-CM

## 2014-10-11 DIAGNOSIS — G2401 Drug induced subacute dyskinesia: Secondary | ICD-10-CM

## 2014-10-11 DIAGNOSIS — IMO0002 Reserved for concepts with insufficient information to code with codable children: Secondary | ICD-10-CM

## 2014-10-11 MED ORDER — TIZANIDINE HCL 2 MG PO TABS: 2.0000 mg | ORAL_TABLET | Freq: Three times a day (TID) | ORAL | Status: DC | PRN

## 2014-10-11 NOTE — Assessment & Plan Note (Signed)
Resolved with Flexeril, Tylenol and conservative therapy. Previously acute on chronic LBP due to MSK etiology.  Plan: 1. Ordered Tizanidine 2mg  - take up to TID PRN only if recurrent flare LBP - (ins not cover flexeril) 2. Use Tylenol PRN, stretching, heating pad 3. Follow-up PRN

## 2014-10-11 NOTE — Progress Notes (Signed)
   Subjective:    Patient ID: [name], female    DOB: April 04, 1956, 59 y.o.   MRN: [id number]  HPI  CHRONIC DM, Type 2: Reports - Continues on improved diet (smaller regular meals + snacks) CBGs: Avg 120, Low 63 (+symptoms), High 269 (asymptomatic). Checks CBGs 1-3x daily, brought glucometer for review Meds: Lantus to 40u daily (has rx Humalog only for emergencies if significantly elevated CBG, has not used yet) Reports good compliance. Tolerating well w/o side-effects Currently on ACEi Lifestyle: Diet (improved diet) / Exercise (no regular exercise) Admits infrequent hypoglycemia, vision changes with gradual worsening Denies polyuria, numbness or tingling.  MOUTH TWITCHING / TARDIVE DYSKINESIA: - Chronic complaint over past 6 months, followed by [facility] behavioral health / psychiatry, last seen 3 weeks ago for this complaint, took patient off of Abilify (was on 10mg  daily), has not noticed significant improvement, next follow-up with regular Psychiatrist in 1 month to follow-up. Also started on Benztropine 1.5 tabs daily without significant improvement - Admits to dry mouth (on benztropine)  LOW BACK PAIN, ACUTE ON CHRONIC: - History of prior low back pain from prior injuries - Currently resolved LBP. No complaints today, overall improved on muscle relaxer and tylenol. Requests refill for future problems if pain returns. - Denies any dysuria, weakness, numbness or tingling, saddle anesthesia, urinary incontinence or retention  I have reviewed and updated the following as appropriate: allergies and current medications  Social Hx: Active smoker  Review of Systems  See above HPI    Objective:   Physical Exam  BP 126/79 mmHg  Pulse 92  Temp(Src) 98.1 F (36.7 C) (Oral)  Ht 5\' 2"  (1.575 m)  Wt 154 lb 11.2 oz (70.171 kg)  BMI 28.29 kg/m2  Gen - well-appearing, frequent mouth / lip movements, NAD HEENT - oropharynx clear, MMM Heart - RRR, no murmurs heard MSK - Back:  non-tender to palpation Ext - non-tender, no edema, peripheral pulses intact +2 b/l Skin - warm, dry, no rashes Neuro - awake, alert, oriented, grossly non-focal, gait normal     Assessment & Plan:   See specific A&P problem list for details.

## 2014-10-11 NOTE — Assessment & Plan Note (Signed)
Stable without improvement. Last seen by [facility] 3 weeks ago, discontinued Abilify 10mg  daily started Benztropine - Symptoms may not resolve if due to tardive dyskinesia  Plan: 1. Continue Seroquel and Benztropine as prescribed by [facility] 2. Follow-up with [facility] psychiatry 1 month re-evaluate

## 2014-10-11 NOTE — Assessment & Plan Note (Signed)
Improved control DM Last A1c 8.1 (home health, 10/03/14), previous A1c 8.4 ([id number]) - complicated by improved hypoglycemia, cataracts, peripheral neuropathy, DM gastroparesis - DM Foot exam (03/21/14), last Eye Exam 2015  Plan:  1. Continue Lantus to 40u daily, advised on titrating up if worsening with avg CBG >250s 2. Additionally pt purchased OTC Humalog as back-up for significantly elevated CBGs >400-500 (advised be cautious with use and contact [place] if needing to use) 3. Lifestyle Mods - Encouraged improved DM diet (dec carbs, inc vegs), walking exercise 4. RTC 3 month for A1c / annual physical - if A1c improved / stable, will space to 6 months

## 2014-10-11 NOTE — Patient Instructions (Signed)
Dear [name], Thank you for coming in to clinic today. Sorry that you were in the hospital recently.  1. Keep up the good work - Diabetes is improving. Hgb A1c 8.1 2. Continue to eat small regular meals as tolerated 3. Use lantus 40 units daily - let us know if you need to use back up insulin 4. For Back - sent new muscle relaxant Tizanidine - take 1 tablet up to 2 to 3 times daily (ever 8 to 12 hours) as needed, only if worsening pain, try tylenol as well 5. Return to [facility] in 1 month to discuss medications and your mouth movement symptoms  Some important numbers from today's visit: BP - 126/79 Results -  Please schedule a follow-up appointment with Dr. Parks Ranger 2-3 months for Annual Physical for Pap Smear, labs.  If you have any other questions or concerns, please feel free to call the clinic to contact me. You may also schedule an earlier appointment if necessary.  However, if your symptoms get significantly worse, please go to the Emergency Department to seek immediate medical attention.  [name], [place]

## 2014-12-09 ENCOUNTER — Other Ambulatory Visit: Payer: Self-pay | Admitting: Family Medicine

## 2014-12-09 DIAGNOSIS — E1165 Type 2 diabetes mellitus with hyperglycemia: Secondary | ICD-10-CM

## 2014-12-09 DIAGNOSIS — IMO0002 Reserved for concepts with insufficient information to code with codable children: Secondary | ICD-10-CM

## 2014-12-14 ENCOUNTER — Other Ambulatory Visit: Payer: Self-pay | Admitting: *Deleted

## 2014-12-14 DIAGNOSIS — IMO0002 Reserved for concepts with insufficient information to code with codable children: Secondary | ICD-10-CM

## 2014-12-14 DIAGNOSIS — E1165 Type 2 diabetes mellitus with hyperglycemia: Secondary | ICD-10-CM

## 2014-12-14 MED ORDER — GLUCOSE BLOOD VI STRP: ORAL_STRIP | Status: DC

## 2015-01-29 ENCOUNTER — Other Ambulatory Visit: Payer: Self-pay | Admitting: *Deleted

## 2015-01-29 DIAGNOSIS — I10 Essential (primary) hypertension: Secondary | ICD-10-CM

## 2015-01-29 MED ORDER — METOPROLOL SUCCINATE ER 50 MG PO TB24: 50.0000 mg | ORAL_TABLET | Freq: Every day | ORAL | Status: DC

## 2015-02-01 ENCOUNTER — Encounter: Payer: Self-pay | Admitting: Internal Medicine

## 2015-03-02 ENCOUNTER — Other Ambulatory Visit: Payer: Self-pay | Admitting: Family Medicine

## 2015-03-02 DIAGNOSIS — I10 Essential (primary) hypertension: Secondary | ICD-10-CM

## 2015-03-22 ENCOUNTER — Ambulatory Visit: Payer: Medicare Other | Admitting: Family Medicine

## 2015-04-02 ENCOUNTER — Ambulatory Visit: Payer: Medicare Other | Admitting: Family Medicine

## 2015-04-11 ENCOUNTER — Encounter: Payer: Self-pay | Admitting: Family Medicine

## 2015-04-11 ENCOUNTER — Ambulatory Visit (INDEPENDENT_AMBULATORY_CARE_PROVIDER_SITE_OTHER): Payer: Medicare Other | Admitting: Family Medicine

## 2015-04-11 VITALS — BP 143/64 | HR 100 | Temp 98.1°F | Ht 62.0 in | Wt 147.5 lb

## 2015-04-11 DIAGNOSIS — F3189 Other bipolar disorder: Secondary | ICD-10-CM

## 2015-04-11 DIAGNOSIS — H269 Unspecified cataract: Secondary | ICD-10-CM | POA: Diagnosis not present

## 2015-04-11 DIAGNOSIS — F3341 Major depressive disorder, recurrent, in partial remission: Secondary | ICD-10-CM

## 2015-04-11 DIAGNOSIS — E1100 Type 2 diabetes mellitus with hyperosmolarity without nonketotic hyperglycemic-hyperosmolar coma (NKHHC): Secondary | ICD-10-CM

## 2015-04-11 DIAGNOSIS — Z23 Encounter for immunization: Secondary | ICD-10-CM

## 2015-04-11 DIAGNOSIS — IMO0002 Reserved for concepts with insufficient information to code with codable children: Secondary | ICD-10-CM

## 2015-04-11 DIAGNOSIS — E1142 Type 2 diabetes mellitus with diabetic polyneuropathy: Secondary | ICD-10-CM

## 2015-04-11 DIAGNOSIS — E1165 Type 2 diabetes mellitus with hyperglycemia: Secondary | ICD-10-CM

## 2015-04-11 DIAGNOSIS — Z794 Long term (current) use of insulin: Secondary | ICD-10-CM

## 2015-04-11 LAB — POCT GLYCOSYLATED HEMOGLOBIN (HGB A1C): HEMOGLOBIN A1C: 10.5

## 2015-04-11 MED ORDER — GLUCOSE BLOOD VI STRP: ORAL_STRIP | Status: DC

## 2015-04-11 MED ORDER — INSULIN GLARGINE 100 UNIT/ML ~~LOC~~ SOLN: SUBCUTANEOUS | Status: DC

## 2015-04-11 MED ORDER — ONETOUCH ULTRASOFT LANCETS MISC: Status: DC

## 2015-04-11 MED ORDER — ONETOUCH ULTRA 2 W/DEVICE KIT: 1.0000 | PACK | Freq: Once | Status: AC

## 2015-04-11 NOTE — Patient Instructions (Signed)
Dear Stark Jock, Thank you for coming in to clinic today. Sorry that you were in the hospital recently.  1. Diabetes has worsened - A1c up to 10.1, likely from months without insulin - I am confident you can get back on track now that you are taking medicines again - No change to insulin - continue Lantus 40 units daily - Sent new Glucometer and test supplies to pharmacy - start checking sugars again, bring written log to next apt  2. Referral to Eye doctor - you will get appointment  3. Referral to Psychiatry and counseling / therapy - you will be notified on appointment  Please schedule a follow-up appointment with Dr. Parks Ranger 4 weeks for Diabetes  If you have any other questions or concerns, please feel free to call the clinic to contact me. You may also schedule an earlier appointment if necessary.  However, if your symptoms get significantly worse, please go to the Emergency Department to seek immediate medical attention.  [name], [place]

## 2015-04-11 NOTE — Progress Notes (Signed)
Subjective:    Patient ID: [name], female    DOB: [birth year], 59 y.o.   MRN: [id number]  [name] is a 59 y.o. female presenting on 04/11/2015 for Hyperglycemia   HPI  CHRONIC DM, Type 2: Reports - concerns with hyperglycemia concerns back in June and August 2016 due to recent significant stressors with husband leaving her, she stopped taking all medicine including insulin for 1-2 months and now back on, average CBG 200-300s. Also recently moved and lost her glucometer, needs a new one Meds: (now resumed) Lantus to 40u daily (has rx Humalog only for emergencies if significantly elevated CBG, has not used yet) Reports good compliance. Tolerating well w/o side-effects Currently on ACEi Lifestyle: Diet (improved diet) / Exercise (no regular exercise) Admits vision changes with chronic blurred vision and has cataracts (couldn't afford surgery, previously at Ophtho in [facility]) - Request referral to new eye doctor Denies polyuria, numbness or tingling.  DEPRESSION, RECURRENT: - Husband left her 3-4 months ago, and this along with recent move have caused significant stress. Went through period of not taking any medicines in June and August, since [facility] with [facility] and is now regularly taking all medicines and doing better - Concerns with [facility] not accepting insurance, for psychiatrist and group therapy - Husband went to work one night, didn't come back until late, they argued and then he never came back, she is concerned as this was a surprise to her - PHQ9: score 8, somewhat difficult - Improved now back on medications, but her husband helped her keep on a regular schedule with medicines  HM: - Upcoming scheduled Mammogram 04/20/2015 Flu shot today   Social Hx: Active smoker  Past Medical History  Diagnosis Date  . DM (diabetes mellitus), type 2 ([place])   . Hypercholesteremia   . HTN (hypertension)   . Trigger finger     r index inj 12/08, bilateral  thumb inj 11/09, r thumb and dequervain's inj 01/2009  . Bipolar 2 disorder ([place])   . Marijuana abuse   . Anxiety   . Arthritis   . Depression   . GERD (gastroesophageal reflux disease)   . Chest pain     Social History   Social History  . Marital Status: Married    Spouse Name: N/A  . Number of Children: N/A  . Years of Education: N/A   Occupational History  . CNA     full time at [employer]  . Smoking status: Current Every Day Smoker -- 0.50 packs/day for 30 years    Types: Cigarettes  . Smokeless tobacco: Never Used     Comment: down to 8 cig/daily  . Alcohol Use: No  . Drug Use: No  . Sexual Activity:    Partners: Male   Other Topics Concern  . Not on file   Social History Narrative   Finances are a major issue. Meeting with [place]. Rejected by healthcare sharing initiative   currently receiving med assistance for lantus through our office and lyrica.      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 Pt in process of obtaining disability.     Current Outpatient Prescriptions on File Prior to Visit  Medication Sig  . aspirin 81 MG tablet Take 1 tablet (81 mg total) by mouth daily.  [name] atorvastatin (  LIPITOR) 40 MG tablet Take 1 tablet (40 mg total) by mouth daily.  . benztropine (COGENTIN) 1 MG tablet   . gabapentin (NEURONTIN) 100 MG capsule Take 100 mg by mouth 4 (four) times daily.  [name] glycerin adult (GLYCERIN ADULT) 2 G SUPP Place 1 suppository rectally once as needed for moderate constipation.  [name] HUMALOG 100 UNIT/ML injection   . lisinopril-hydrochlorothiazide (PRINZIDE,ZESTORETIC) 20-12.5 MG per tablet TAKE TWO TABLETS BY MOUTH ONCE DAILY  . metoprolol succinate (TOPROL-XL) 50 MG 24 hr tablet Take 1 tablet (50 mg total) by mouth daily. Take with or immediately following a meal.  .  mirtazapine (REMERON) 30 MG tablet   . nitroGLYCERIN (NITROSTAT) 0.4 MG SL tablet Place 1 tablet (0.4 mg total) under the tongue every 5 (five) minutes as needed for chest pain.  . ONE TOUCH LANCETS MISC 1 Device by Does not apply route 3 (three) times daily.  . pantoprazole (PROTONIX) 40 MG tablet   . polyethylene glycol powder (GLYCOLAX/MIRALAX) powder Take 17 g by mouth daily as needed.  . SEROQUEL XR 200 MG 24 hr tablet   . tiZANidine (ZANAFLEX) 2 MG tablet Take 1 tablet (2 mg total) by mouth every 8 (eight) hours as needed.   No current facility-administered medications on file prior to visit.    Review of Systems  Constitutional: Negative for fever, chills, diaphoresis, activity change, appetite change and fatigue.  Eyes: Positive for visual disturbance (chronic bilateral worsening blurry vision with known cataracts, some floaters). Negative for photophobia, pain, discharge and redness.  Respiratory: Negative for cough and shortness of breath.   Cardiovascular: Negative for chest pain, palpitations and leg swelling.  Gastrointestinal: Negative for nausea, vomiting, abdominal pain, diarrhea and constipation.  Neurological: Negative for dizziness, weakness, light-headedness, numbness and headaches.  Psychiatric/Behavioral: Positive for sleep disturbance (improved now back on psych meds) and dysphoric mood (improving back on psych meds, recent stressors per above). Negative for suicidal ideas, hallucinations, behavioral problems, self-injury and decreased concentration. The patient is not nervous/anxious.    Per HPI unless specifically indicated above     Objective:    BP 143/64 mmHg  Pulse 100  Temp(Src) 98.1 F (36.7 C) (Oral)  Ht _0  (1.575 m)  Wt 147 lb 8 oz (66.906 kg)  BMI 26.97 kg/m2  Wt Readings from Last 3 Encounters:  04/11/15 147 lb 8 oz (66.906 kg)  10/11/14 154 lb 11.2 oz (70.171 kg)  08/21/14 156 lb (70.761 kg)    Physical Exam  Constitutional: She is oriented  to person, place, and time. She appears well-developed and well-nourished. No distress.  HENT:  Head: Normocephalic and atraumatic.  Mouth/Throat: Oropharynx is clear and moist.  Eyes: Conjunctivae and EOM are normal. Pupils are equal, round, and reactive to light. Right eye exhibits no discharge. Left eye exhibits no discharge. No scleral icterus.  Direct ophthalmoscope exam of fundus limited with non-dilated pupils and known cataracts. No obvious DM retinopathy or papilledema but limited view of disc  Neck: Normal range of motion. Neck supple.  Cardiovascular: Normal rate, regular rhythm, normal heart sounds and intact distal pulses.   No murmur heard. Lymphadenopathy:    She has no cervical adenopathy.  Neurological: She is alert and oriented to person, place, and time.  Frequent mouth / lip movements, stable chronically with tardive dyskinesia  Skin: Skin is warm and dry. She is not diaphoretic.  Psychiatric: She has a normal mood and affect. Her behavior is normal. Judgment and thought content normal.  Nursing note  and vitals reviewed.  Results for orders placed or performed in visit on 04/11/15  POCT HgB A1C (CPT 83036)  Result Value Ref Range   Hemoglobin A1C 10.5       Assessment & Plan:   Problem List Items Addressed This Visit      Endocrine   DM (diabetes mellitus), type 2, uncontrolled ([place]) - Primary    Significant worsening control Diabetes Last A1c 10.5 (03/2015) previous A1c 8.1 (home health, 7/35/32) - complicated by cataracts, peripheral neuropathy, DM gastroparesis - due for referral Ophth to [facility] (last Eye Exam 2015, in [place])  Plan:  1. Continue Lantus to 40u daily, advised may titrate up if worsening with avg CBG >250s, however needs to get back on regular schedule with routine meals and daily dosing same time. No CBG logs to adjust today, will avoid increasing to reduce hypoglycemia 2. Re-ordered OneTouch glucometer and diabetes testing supplies 3.  Lifestyle Mods - Encouraged improved DM diet with regular meals (dec carbs, inc vegs), walking exercise - Referral to Ophtho today 4. RTC 3 month for A1c      Relevant Medications   Blood Glucose Monitoring Suppl (ONE TOUCH ULTRA 2) W/DEVICE KIT   glucose blood (ONE TOUCH ULTRA TEST) test strip   Lancets (ONETOUCH ULTRASOFT) lancets   insulin glargine (LANTUS) 100 UNIT/ML injection   Other Relevant Orders   POCT HgB A1C (CPT 83036) (Completed)   Ambulatory referral to Ophthalmology     Other   Cataracts, bilateral    Stable, gradual worsening cataracts in DIABETES  Plan: 1. Referral to Ophtho to relocate from [place] to [place] 2. Will ultimately need cataract surgery previously unable to afford      Relevant Orders   Ambulatory referral to Ophthalmology   Depression, major, recurrent ([place])    Improving now, had been worsened and relapse back in June-August when husband left her. Was off all meds. Now resumed with [facility] and back on psych meds - PHQ9: score 8 somewhat difficult - Denies any suicidal or homicidal ideation  Plan: 1. Continue current therapy with Seroquel and Remeron 2. Referral placed to [facility] behavioral health for both Psychiatry (re-establish, may not be able to follow [facility] anymore) and also for counseling / therapy 3. RTC 1-3 months, follow-up, may benefit from [facility]      Relevant Orders   Ambulatory referral to Psychiatry   Other bipolar disorder ([place])    Significant recent stressors with husband leaving, has worsened depression but now improving back on all medications, following [facility] (but may have difficulty following with them in future due to ins)  Plan: 1. Continue current meds with Seroquel, Remeron      Relevant Orders   Ambulatory referral to Psychiatry      Meds ordered this encounter  Medications  . Blood Glucose Monitoring Suppl (ONE TOUCH ULTRA 2) W/DEVICE KIT    Sig: 1 kit by Does not apply route once.     Dispense:  1 each    Refill:  0  . glucose blood (ONE TOUCH ULTRA TEST) test strip    Sig: Test 3 times a day.    Dispense:  100 each    Refill:  12  . Lancets (ONETOUCH ULTRASOFT) lancets    Sig: Use as instructed    Dispense:  100 each    Refill:  12  . insulin glargine (LANTUS) 100 UNIT/ML injection    Sig: INJECT 40 UNITS SUBCUTANEOUSLY ONCE DAILY    Dispense:  20  vial    Refill:  5      Follow up plan: Return in about 4 weeks (around 05/09/2015) for diabetes.  A total of 25 minutes was spent face-to-face with this patient. Over half this time was spent on counseling patient on the diagnosis and different diagnostic and therapeutic options available.  [name], [facility], PGY-3

## 2015-04-13 NOTE — Assessment & Plan Note (Signed)
Significant recent stressors with husband leaving, has worsened depression but now improving back on all medications, following [facility] (but may have difficulty following with them in future due to ins)  Plan: 1. Continue current meds with Seroquel, Remeron

## 2015-04-13 NOTE — Assessment & Plan Note (Signed)
Improving now, had been worsened and relapse back in June-August when husband left her. Was off all meds. Now resumed with [facility] and back on psych meds - PHQ9: score 8 somewhat difficult - Denies any suicidal or homicidal ideation  Plan: 1. Continue current therapy with Seroquel and Remeron 2. Referral placed to [facility] behavioral health for both Psychiatry (re-establish, may not be able to follow [facility] anymore) and also for counseling / therapy 3. RTC 1-3 months, follow-up, may benefit from [facility]

## 2015-04-13 NOTE — Assessment & Plan Note (Signed)
Significant worsening control Diabetes Last A1c 10.5 (03/2015) previous A1c 8.1 (home health, [id number]) - complicated by cataracts, peripheral neuropathy, DM gastroparesis - due for referral Ophth to [facility] (last Eye Exam 2015, in [place])  Plan:  1. Continue Lantus to 40u daily, advised may titrate up if worsening with avg CBG >250s, however needs to get back on regular schedule with routine meals and daily dosing same time. No CBG logs to adjust today, will avoid increasing to reduce hypoglycemia 2. Re-ordered OneTouch glucometer and diabetes testing supplies 3. Lifestyle Mods - Encouraged improved DM diet with regular meals (dec carbs, inc vegs), walking exercise - Referral to Ophtho today 4. RTC 3 month for A1c

## 2015-04-13 NOTE — Assessment & Plan Note (Signed)
Stable, gradual worsening cataracts in DIABETES  Plan: 1. Referral to Ophtho to relocate from [place] to [place] 2. Will ultimately need cataract surgery previously unable to afford

## 2015-04-27 ENCOUNTER — Other Ambulatory Visit: Payer: Self-pay | Admitting: *Deleted

## 2015-04-27 DIAGNOSIS — E785 Hyperlipidemia, unspecified: Secondary | ICD-10-CM

## 2015-04-27 DIAGNOSIS — E1165 Type 2 diabetes mellitus with hyperglycemia: Secondary | ICD-10-CM

## 2015-04-27 DIAGNOSIS — IMO0002 Reserved for concepts with insufficient information to code with codable children: Secondary | ICD-10-CM

## 2015-04-27 DIAGNOSIS — I10 Essential (primary) hypertension: Secondary | ICD-10-CM

## 2015-04-27 MED ORDER — LISINOPRIL-HYDROCHLOROTHIAZIDE 20-12.5 MG PO TABS: 2.0000 | ORAL_TABLET | Freq: Every day | ORAL | Status: DC

## 2015-04-27 MED ORDER — ATORVASTATIN CALCIUM 40 MG PO TABS: 40.0000 mg | ORAL_TABLET | Freq: Every day | ORAL | Status: DC

## 2015-04-27 MED ORDER — ASPIRIN 81 MG PO TABS: 81.0000 mg | ORAL_TABLET | Freq: Every day | ORAL | Status: DC

## 2015-04-27 MED ORDER — METOPROLOL SUCCINATE ER 50 MG PO TB24
50.0000 mg | ORAL_TABLET | Freq: Every day | ORAL | Status: DC
Start: 2015-04-27 — End: 2015-09-18

## 2015-04-27 NOTE — Addendum Note (Signed)
Addended by: Valerie Roys on: 04/27/2015 03:13 PM   Modules accepted: Orders

## 2015-05-16 ENCOUNTER — Ambulatory Visit (INDEPENDENT_AMBULATORY_CARE_PROVIDER_SITE_OTHER): Payer: Medicare Other | Admitting: Family Medicine

## 2015-05-16 ENCOUNTER — Encounter: Payer: Self-pay | Admitting: Family Medicine

## 2015-05-16 VITALS — BP 134/56 | HR 92 | Temp 98.4°F | Ht 62.0 in | Wt 141.8 lb

## 2015-05-16 DIAGNOSIS — G6289 Other specified polyneuropathies: Secondary | ICD-10-CM

## 2015-05-16 DIAGNOSIS — M545 Low back pain, unspecified: Secondary | ICD-10-CM

## 2015-05-16 DIAGNOSIS — F17200 Nicotine dependence, unspecified, uncomplicated: Secondary | ICD-10-CM | POA: Diagnosis not present

## 2015-05-16 DIAGNOSIS — J441 Chronic obstructive pulmonary disease with (acute) exacerbation: Secondary | ICD-10-CM | POA: Diagnosis not present

## 2015-05-16 DIAGNOSIS — F3189 Other bipolar disorder: Secondary | ICD-10-CM | POA: Diagnosis not present

## 2015-05-16 DIAGNOSIS — F3341 Major depressive disorder, recurrent, in partial remission: Secondary | ICD-10-CM

## 2015-05-16 MED ORDER — TIZANIDINE HCL 2 MG PO TABS: 2.0000 mg | ORAL_TABLET | Freq: Three times a day (TID) | ORAL | Status: DC | PRN

## 2015-05-16 MED ORDER — ALBUTEROL SULFATE HFA 108 (90 BASE) MCG/ACT IN AERS: 1.0000 | INHALATION_SPRAY | RESPIRATORY_TRACT | Status: DC | PRN

## 2015-05-16 MED ORDER — SEROQUEL XR 200 MG PO TB24: 200.0000 mg | ORAL_TABLET | Freq: Every day | ORAL | Status: DC

## 2015-05-16 MED ORDER — MIRTAZAPINE 30 MG PO TABS: 30.0000 mg | ORAL_TABLET | Freq: Every day | ORAL | Status: DC

## 2015-05-16 MED ORDER — PREDNISONE 50 MG PO TABS
50.0000 mg | ORAL_TABLET | Freq: Every day | ORAL | Status: DC
Start: 2015-05-16 — End: 2015-05-31

## 2015-05-16 MED ORDER — LEVOFLOXACIN 500 MG PO TABS
500.0000 mg | ORAL_TABLET | Freq: Every day | ORAL | Status: DC
Start: 2015-05-16 — End: 2015-05-31

## 2015-05-16 MED ORDER — GABAPENTIN 100 MG PO CAPS: 100.0000 mg | ORAL_CAPSULE | Freq: Four times a day (QID) | ORAL | Status: DC

## 2015-05-16 NOTE — Assessment & Plan Note (Signed)
Acute on chronic LBP, seems MSK like previous similar pains. No inciting injury or trauma. Likely related to recent acute illness with coughing. - No back pain red flags. No radicular pain on exam.  Plan: 1. Reassurance, likely from frequent coughing with prior back MSK problem 2. Refilled Tizanidine muscle relaxant 3. Conservative therapy with Tylenol scheduled, heating pad, stretches 4. RTC 1-2 weeks, future follow-up may benefit from PT

## 2015-05-16 NOTE — Assessment & Plan Note (Addendum)
Consistent with mild acute exacerbation of COPD with prior viral URI vs flu-like illness worsening productive cough. No formal COPD diagnosis but active and long history of smoking. Less likely CAP but possible with cough and fevers, some crackles heard but wheezing more prominent. - No hypoxia (99% on RA), afebrile, no recent hospitalization  Plan: 1. Start Prednisone 50mg  x 5 day steroid burst 2. Start new albuterol q 4 hr regularly x 2-3 days 3. Start Levaquin 500mg  daily x 7 days - potential coverage for empiric CAP - Offered chest x-ray, patient opted to not proceed with this, agree as will likely not change treatment unless worsening 4. RTC about 1 week if not improving, otherwise strict return criteria to go to ED  Would benefit from PFTs in future with pharm clinic dr Valentina Lucks

## 2015-05-16 NOTE — Assessment & Plan Note (Signed)
Stable mood currently Refilled Seroquel, Remeron Follow-up with [facility] January 2017

## 2015-05-16 NOTE — Assessment & Plan Note (Signed)
Stable mood currently Refilled Seroquel, Remeron Follow-up with [facility] January 2017

## 2015-05-16 NOTE — Assessment & Plan Note (Signed)
Active smoker, likely COPD now Counseled on quitting May benefit from future Pharm Clinic Dr Valentina Lucks smoking cessation

## 2015-05-16 NOTE — Addendum Note (Signed)
Addended by: Olin Hauser on: 05/16/2015 09:25 PM   Modules accepted: Orders

## 2015-05-16 NOTE — Patient Instructions (Signed)
Thank you for coming in to clinic today.  1. Take Levaquin 500mg  antibiotic daily for 7 days. Take Prednisone 50mg  daily for 5 days. 2. May take Mucinex OTC 600mg  twice daily as needed for cough. 3. Recommend to start taking Tylenol Extra Strength 500mg  tabs - take 1 to 2 tabs (max 1000mg  per dose) every 6 hours for pain (take regularly, don't skip a dose for next 3-7 days), max 24 hour daily dose is 6 to 8 tablets or 3000 to 4000mg  4. Use heating pad on Left shoulder and Right Back 5. Refilled your other Mental Health meds until January  If symptoms significantly worsen, worsening shortness of breath, if you develop chest pains, persistent fevers, please call back or go to the Emergency Department.  Please schedule a follow-up appointment with Dr Parks Ranger in 1 to 2 weeks follow-up COPD and Diabetes  If you have any other questions or concerns, please feel free to call the clinic to contact me. You may also schedule an earlier appointment if necessary.  However, if your symptoms get significantly worse, please go to the Emergency Department to seek immediate medical attention.  [name], [place]

## 2015-05-16 NOTE — Progress Notes (Signed)
Subjective:    Patient ID: [name], female    DOB: [birth year], 59 y.o.   MRN: [id number]  [name] is a 59 y.o. female presenting on 05/16/2015 for Cough; Nasal Congestion; Chills; and Abdominal Pain  HPI  URI / CHEST CONGESTION / FLU-LIKE ILLNESS: - Reports symptoms started about 2 weeks ago on Friday, acute onset with nasal congestion and rhinorrhea, then followed by sinus pressure / congestion, HAs, generalized weakness difficulty getting out of bed, then seemed to worsen and move into her chest with more thick mucus and difficulty coughing up. Currently with persistent nasal congestion, now resolved sinus pain / HA, cough, and symptoms worse at night - Admits to intermittent fevers/chills, worse at night - Admits dyspnea with prolonged activity or talking - Tried OTC meds - NyQuil, Alka-seltzer plus, Tylenol PM, Goody Powder - Active smoker, no formal diagnosis of COPD - S/p flu shot  LEFT SHOULDER / UPPER BACK PAIN: - Reports symptoms with Left shoulder pain started acutely while at rest 2 days ago, associated radiating numbness into left arm and into hand/fingers, felt "cold and numb" persistent at this time. - Pain worse with any movement of neck or shoulder, admits to "tight muscles"  DEPRESSION: - Previously referred next appointment with [facility] health January 2017, out of all meds needs refills until then, no longer ins accepted at [facility] - Request refill Seroquel, Remeron, Gabapentin - Mood stable today without worsening depression. Denies suicidal or homicidal ideation.  Past Medical History  Diagnosis Date  . DM (diabetes mellitus), type 2 ([place])   . Hypercholesteremia   . HTN (hypertension)   . Trigger finger     r index inj 12/08, bilateral thumb inj 11/09, r thumb and dequervain's inj 01/2009  . Bipolar 2 disorder ([place])   . Marijuana abuse   . Anxiety   . Arthritis   . Depression   . GERD (gastroesophageal reflux disease)   . Chest pain      Social History   Social History  . Marital Status: Married    Spouse Name: N/A  . Number of Children: N/A  . Years of Education: N/A   Occupational History  . 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 Smoking status: Current Every Day Smoker -- 0.50 packs/day for 30 years    Types: Cigarettes  . Smokeless tobacco: Never Used     Comment: down to 8 cig/daily  . Alcohol Use: No  . Drug Use: No  . Sexual Activity:    Partners: Male   Other Topics Concern  . Not on file   Social History Narrative   Finances are a major issue. Meeting with [place]. Rejected by healthcare sharing initiative   currently receiving med assistance for lantus through our office and lyrica.      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 Pt in process of obtaining disability.     Current Outpatient Prescriptions on File Prior to Visit  Medication Sig  . aspirin 81 MG tablet Take 1 tablet (81 mg total) by mouth daily.  [name] atorvastatin (LIPITOR) 40 MG tablet Take 1 tablet (40 mg total) by mouth daily.  . benztropine (COGENTIN) 1 MG tablet   . Blood Glucose Monitoring Suppl (ONE TOUCH ULTRA 2) W/DEVICE KIT 1 kit by Does  not apply route once.  [name] glucose blood (ONE TOUCH ULTRA TEST) test strip Test 3 times a day.  [name] glycerin adult (GLYCERIN ADULT) 2 G SUPP Place 1 suppository rectally once as needed for moderate constipation.  [name] HUMALOG 100 UNIT/ML injection   . insulin glargine (LANTUS) 100 UNIT/ML injection INJECT 40 UNITS SUBCUTANEOUSLY ONCE DAILY  . Lancets (ONETOUCH ULTRASOFT) lancets Use as instructed  . lisinopril-hydrochlorothiazide (PRINZIDE,ZESTORETIC) 20-12.5 MG tablet Take 2 tablets by mouth daily.  . metoprolol succinate (TOPROL-XL) 50 MG 24 hr tablet Take 1 tablet (50 mg total) by mouth daily. Take with or immediately  following a meal.  . nitroGLYCERIN (NITROSTAT) 0.4 MG SL tablet Place 1 tablet (0.4 mg total) under the tongue every 5 (five) minutes as needed for chest pain.  . ONE TOUCH LANCETS MISC 1 Device by Does not apply route 3 (three) times daily.  . pantoprazole (PROTONIX) 40 MG tablet   . polyethylene glycol powder (GLYCOLAX/MIRALAX) powder Take 17 g by mouth daily as needed.   No current facility-administered medications on file prior to visit.    Review of Systems  Constitutional: Positive for fever, chills and activity change (decreased activity, has been bedbound recently for few days). Negative for diaphoresis, appetite change and fatigue.  HENT: Positive for congestion (nasal) and rhinorrhea. Negative for postnasal drip, sinus pressure, sneezing and sore throat.   Respiratory: Positive for cough, chest tightness, shortness of breath and wheezing.   Cardiovascular: Negative for chest pain and leg swelling.  Gastrointestinal: Negative for nausea, vomiting and abdominal pain.  Genitourinary: Negative for dysuria, frequency and hematuria.  Musculoskeletal: Positive for back pain (right lower back with radiation to right flank) and arthralgias.       Muscle spasm, left upper back/shoulder  Neurological: Positive for weakness (generalized). Negative for headaches.   Per HPI unless specifically indicated above     Objective:    BP 134/56 mmHg  Pulse 92  Temp(Src) 98.4 F (36.9 C) (Oral)  Ht 5' 2"  (1.575 m)  Wt 141 lb 12.8 oz (64.32 kg)  BMI 25.93 kg/m2  SpO2 99%  Wt Readings from Last 3 Encounters:  05/16/15 141 lb 12.8 oz (64.32 kg)  04/11/15 147 lb 8 oz (66.906 kg)  10/11/14 154 lb 11.2 oz (70.171 kg)    Physical Exam  Constitutional: She is oriented to person, place, and time. She appears well-developed and well-nourished. No distress.  HENT:  Head: Normocephalic and atraumatic.  Mouth/Throat: Oropharynx is clear and moist. No oropharyngeal exudate.  Congestion in nares    Eyes: Conjunctivae and EOM are normal. Pupils are equal, round, and reactive to light.  Neck: Normal range of motion. Neck supple. No thyromegaly present.  Full range but inc pain with left rotation. Spurling's maneuver negative bilateral for radiculopathy in arms  Cardiovascular: Normal rate, regular rhythm, normal heart sounds and intact distal pulses.   No murmur heard. Pulmonary/Chest: Effort normal. No respiratory distress. Wheezes: bilateral. She has rales. She exhibits no tenderness.  Speaks full sentences. Good air movement  Abdominal: Soft. Bowel sounds are normal. She exhibits no distension and no mass. There is no tenderness. There is no rebound and no guarding.  Musculoskeletal:       Cervical back: She exhibits tenderness and spasm. She exhibits normal range of motion and no bony tenderness.       Lumbar back: She exhibits tenderness, pain and spasm. She exhibits normal range of motion and no bony tenderness.       Back:  Seated SLR right with reproduced pain radiating to R-flank but not radicular pain  Lymphadenopathy:    She has no cervical adenopathy.  Neurological: She is alert and oriented to person, place, and time.  Skin: Skin is warm and dry. She is not diaphoretic.  Psychiatric: She has a normal mood and affect. Her behavior is normal. Thought content normal.  Nursing note and vitals reviewed.  Results for orders placed or performed in visit on 04/11/15  POCT HgB A1C (CPT 83036)  Result Value Ref Range   Hemoglobin A1C 10.5       Assessment & Plan:   Problem List Items Addressed This Visit      Respiratory   COPD exacerbation ([place]) - Primary    Consistent with mild acute exacerbation of COPD with prior viral URI vs flu-like illness worsening productive cough. No formal COPD diagnosis but active and long history of smoking. Less likely CAP but possible with cough and fevers, some crackles heard but wheezing more prominent. - No hypoxia (99% on RA), afebrile,  no recent hospitalization  Plan: 1. Start Prednisone 3m x 5 day steroid burst 2. Start new albuterol q 4 hr regularly x 2-3 days 3. Start Levaquin 507mdaily x 7 days - potential coverage for empiric CAP - Offered chest x-ray, patient opted to not proceed with this, agree as will likely not change treatment unless worsening 4. RTC about 1 week if not improving, otherwise strict return criteria to go to ED  Would benefit from PFTs in future with pharm clinic dr koValentina Lucks     Relevant Medications   levofloxacin (LEVAQUIN) 500 MG tablet   predniSONE (DELTASONE) 50 MG tablet     Other   Depression, major, recurrent (HCC)    Stable mood currently Refilled Seroquel, Remeron [facility]      Relevant Medications   mirtazapine (REMERON) 30 MG tablet   Low back pain    Acute on chronic LBP, seems MSK like previous similar pains. No inciting injury or trauma. Likely related to recent acute illness with coughing. - No back pain red flags. No radicular pain on exam.  Plan: 1. Reassurance, likely from frequent coughing with prior back MSK problem 2. Refilled Tizanidine muscle relaxant 3. Conservative therapy with Tylenol scheduled, heating pad, stretches 4. RTC 1-2 weeks, future follow-up may benefit from PT      Relevant Medications   predniSONE (DELTASONE) 50 MG tablet   tiZANidine (ZANAFLEX) 2 MG tablet   Other bipolar disorder (HCC)    Stable mood currently Refilled Seroquel, Remeron [facility]      Relevant Medications   mirtazapine (REMERON) 30 MG tablet   SEROQUEL XR 200 MG 24 hr tablet   TOBACCO USE    Active smoker, likely COPD now Counseled on quitting May benefit from future Pharm Clinic Dr KoValentina Lucksmoking cessation       Other Visit Diagnoses    Other polyneuropathy (HCC)        Relevant Medications    gabapentin (NEURONTIN) 100 MG capsule    mirtazapine (REMERON) 30 MG  tablet    SEROQUEL XR 200 MG 24 hr tablet    tiZANidine (ZANAFLEX) 2 MG tablet       Meds ordered this encounter  Medications  . levofloxacin (LEVAQUIN) 500 MG tablet    Sig: Take 1 tablet (500 mg total) by mouth daily.    Dispense:  7 tablet  Refill:  0  . predniSONE (DELTASONE) 50 MG tablet    Sig: Take 1 tablet (50 mg total) by mouth daily with breakfast.    Dispense:  5 tablet    Refill:  0  . gabapentin (NEURONTIN) 100 MG capsule    Sig: Take 1 capsule (100 mg total) by mouth 4 (four) times daily.    Dispense:  120 capsule    Refill:  5  . mirtazapine (REMERON) 30 MG tablet    Sig: Take 1 tablet (30 mg total) by mouth at bedtime.    Dispense:  30 tablet    Refill:  2  . SEROQUEL XR 200 MG 24 hr tablet    Sig: Take 1 tablet (200 mg total) by mouth at bedtime.    Dispense:  30 tablet    Refill:  2  . tiZANidine (ZANAFLEX) 2 MG tablet    Sig: Take 1 tablet (2 mg total) by mouth every 8 (eight) hours as needed for muscle spasms.    Dispense:  30 tablet    Refill:  2      Follow up plan: Return in about 1 week (around 05/23/2015) for diabetes.  [name], [place], PGY-3

## 2015-05-30 ENCOUNTER — Encounter: Payer: Self-pay | Admitting: Family Medicine

## 2015-05-30 ENCOUNTER — Ambulatory Visit (INDEPENDENT_AMBULATORY_CARE_PROVIDER_SITE_OTHER): Payer: Medicare Other | Admitting: Family Medicine

## 2015-05-30 VITALS — BP 112/69 | HR 116 | Temp 98.0°F | Ht 62.0 in | Wt 144.0 lb

## 2015-05-30 DIAGNOSIS — E1100 Type 2 diabetes mellitus with hyperosmolarity without nonketotic hyperglycemic-hyperosmolar coma (NKHHC): Secondary | ICD-10-CM | POA: Diagnosis not present

## 2015-05-30 DIAGNOSIS — Z23 Encounter for immunization: Secondary | ICD-10-CM | POA: Diagnosis not present

## 2015-05-30 DIAGNOSIS — H269 Unspecified cataract: Secondary | ICD-10-CM

## 2015-05-30 DIAGNOSIS — M545 Low back pain, unspecified: Secondary | ICD-10-CM

## 2015-05-30 DIAGNOSIS — R0781 Pleurodynia: Secondary | ICD-10-CM

## 2015-05-30 DIAGNOSIS — K3184 Gastroparesis: Secondary | ICD-10-CM

## 2015-05-30 DIAGNOSIS — E1143 Type 2 diabetes mellitus with diabetic autonomic (poly)neuropathy: Secondary | ICD-10-CM

## 2015-05-30 DIAGNOSIS — E860 Dehydration: Secondary | ICD-10-CM

## 2015-05-30 DIAGNOSIS — N179 Acute kidney failure, unspecified: Secondary | ICD-10-CM

## 2015-05-30 NOTE — Patient Instructions (Signed)
Thank you for coming in to clinic today.  1. Diabetes - Keep taking Lantus 40 units daily - Write down your blood sugar logs, bring to next appointment with meter - Please call and leave a detailed message on what [facility] needs from our end to authorize your Diabetic insulin and supplies - Try to go to [facility] in January - Improve hydration to at least 6 cups of water daily - Dizziness may be with dehydration and not eating full regular meals every day, also with poor vision  2. For your Left Rib Pain - this likely has recently worsened after your COPD illness with coughing, could be a muscle or rib strain - Keep taking Muscle relaxer - Keep taking Gabapentin - Use heating pad if pain flares - Try a tight wrap or ace bandage to apply pressure to this area  Recommend to start taking Tylenol Extra Strength 500mg  tabs - take 1 to 2 tabs (max 1000mg  per dose) every 6 hours for pain (take regularly, don't skip a dose for next 3-7 days), max 24 hour daily dose is 6 to 8 tablets or 3000 to 4000mg   Basic blood work today, will notify you if anything abnormal, otherwise can give results at next visit.  Please schedule a follow-up appointment with Dr Parks Ranger in February or 1-2 months for Diabetes follow-up   If you have any other questions or concerns, please feel free to call the clinic to contact me. You may also schedule an earlier appointment if necessary.  However, if your symptoms get significantly worse, please go to the Emergency Department to seek immediate medical attention.  [name], [place]

## 2015-05-30 NOTE — Progress Notes (Signed)
Subjective:    Patient ID: [name], female    DOB: Sep 20, 1955, 59 y.o.   MRN: [id number]  [name] is a 59 y.o. female presenting on 05/30/2015 for Back Pain; COPD; and Dizziness   HPI  CHRONIC DM, Type 2: - Last office visit 03/2015 with prior hyperglycemia due to multiple stressors (including husband leaving her) and not adhering to insulin regimen, then needed new glucometer. Since this visit she has done better with adhering to insulin. Did not bring detailed CBG log or glucometer today. High CBG >500 weeks ago when missed insulin. But while taking insulin average 80 to 140, with one hypoglycemic to 48 (symptomatic). - Not good with regular meals, often loses appetite. Reduce amount for breakfast sometimes has egg or cereal, otherwise does better for lunch. Trying to improve access to food at home so she has something to eat. Went to food bank without much success. - Waiting on Diabetic medications. Rx at [facility] but waiting on authorization from [employer] from provider - Still with blurred vision, some black spots in vision. Has appointment for eye doctor in January 2017, unable to pay the $50 co-pay, at [facility], waiting on this but does not need new ophtho referral - On ACEi - Denies nausea, vomiting, abdomen pain, CP, SOB  FOLLOW-UP COPD EXAC / Left Ribcage Pain: - Last seen 05/16/15 for URI symptoms, dx with COPD exacerbation. Treated with Levaquin and Prednisone, since much improved and resolved illness. Occasional lingering cough now and associated Left mid ribcage pain as a "catch" that hurts intermittently worse with cough and brief episodes. - Not using heating pad or ice. Not tried other OTC meds. - Taking Tizanidine PRN back pain and shoulder pain now resolved. - Denies any fevers, productive cough, CP, shortness of breath, nausea  Dizziness / Dehydration: - Reports some concerns with dizziness off and on for past few days. Denies any room  spinning or vertigo, nausea, vomiting, syncope. States this is not related to CBGs, which have been controlled recently. Does admit to poor PO intake and reduced appetite as usual, poor breakfast intake. Only drinking < 2 cups water daily. - Regular voiding  HM: - Has to re-schedule mammogram, couldn't go in AM - S/p flu shot  Social Hx: Active smoker  Past Medical History  Diagnosis Date  . DM (diabetes mellitus), type 2 ([place])   . Hypercholesteremia   . HTN (hypertension)   . Trigger finger     r index inj 12/08, bilateral thumb inj 11/09, r thumb and dequervain's inj 01/2009  . Bipolar 2 disorder ([place])   . Marijuana abuse   . Anxiety   . Arthritis   . Depression   . GERD (gastroesophageal reflux disease)   . Chest pain     Social History   Social History  . Marital Status: Married    Spouse Name: N/A  . Number of Children: N/A  . Years of Education: N/A   Occupational History  . 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 Smoking status: Current Every Day Smoker -- 0.50 packs/day for 30 years    Types: Cigarettes  . Smokeless tobacco: Never Used     Comment: down to 8 cig/daily  . Alcohol Use: No  . Drug Use: No  . Sexual Activity:    Partners: Male   Other Topics Concern  . Not on file   Social  History Narrative   Finances are a major issue. Meeting with [place]. Rejected by healthcare sharing initiative   currently receiving med assistance for lantus through our office and lyrica.      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 Pt in process of obtaining disability.     Current Outpatient Prescriptions on File Prior to Visit  Medication Sig  . albuterol (PROVENTIL HFA;VENTOLIN HFA) 108 (90 BASE) MCG/ACT inhaler Inhale 1-2 puffs into the lungs every 4 (four) hours as needed for wheezing or  shortness of breath (cough).  [name] aspirin 81 MG tablet Take 1 tablet (81 mg total) by mouth daily.  [name] atorvastatin (LIPITOR) 40 MG tablet Take 1 tablet (40 mg total) by mouth daily.  . benztropine (COGENTIN) 1 MG tablet   . Blood Glucose Monitoring Suppl (ONE TOUCH ULTRA 2) W/DEVICE KIT 1 kit by Does not apply route once.  . gabapentin (NEURONTIN) 100 MG capsule Take 1 capsule (100 mg total) by mouth 4 (four) times daily.  [name] glucose blood (ONE TOUCH ULTRA TEST) test strip Test 3 times a day.  [name] glycerin adult (GLYCERIN ADULT) 2 G SUPP Place 1 suppository rectally once as needed for moderate constipation.  [name] HUMALOG 100 UNIT/ML injection   . insulin glargine (LANTUS) 100 UNIT/ML injection INJECT 40 UNITS SUBCUTANEOUSLY ONCE DAILY  . Lancets (ONETOUCH ULTRASOFT) lancets Use as instructed  . lisinopril-hydrochlorothiazide (PRINZIDE,ZESTORETIC) 20-12.5 MG tablet Take 2 tablets by mouth daily.  . metoprolol succinate (TOPROL-XL) 50 MG 24 hr tablet Take 1 tablet (50 mg total) by mouth daily. Take with or immediately following a meal.  . mirtazapine (REMERON) 30 MG tablet Take 1 tablet (30 mg total) by mouth at bedtime.  . nitroGLYCERIN (NITROSTAT) 0.4 MG SL tablet Place 1 tablet (0.4 mg total) under the tongue every 5 (five) minutes as needed for chest pain.  . ONE TOUCH LANCETS MISC 1 Device by Does not apply route 3 (three) times daily.  . pantoprazole (PROTONIX) 40 MG tablet   . polyethylene glycol powder (GLYCOLAX/MIRALAX) powder Take 17 g by mouth daily as needed.  . SEROQUEL XR 200 MG 24 hr tablet Take 1 tablet (200 mg total) by mouth at bedtime.  [name] tiZANidine (ZANAFLEX) 2 MG tablet Take 1 tablet (2 mg total) by mouth every 8 (eight) hours as needed for muscle spasms.   No current facility-administered medications on file prior to visit.    Review of Systems  Constitutional: Negative for activity change and appetite change.  Eyes: Positive for visual disturbance (chronic bilateral worsening  blurry vision with known cataracts, some floaters). Negative for photophobia, pain, discharge and redness.  Respiratory: Negative for shortness of breath.   Cardiovascular: Negative for palpitations and leg swelling.  Gastrointestinal: Negative for diarrhea and constipation.  Neurological: Negative for light-headedness.  Psychiatric/Behavioral: Positive for sleep disturbance (improved now back on psych meds) and dysphoric mood (improving back on psych meds, recent stressors per above). Negative for suicidal ideas, hallucinations, behavioral problems, self-injury and decreased concentration. The patient is not nervous/anxious.    Per HPI unless specifically indicated above     Objective:    BP 112/69 mmHg  Pulse 116  Temp(Src) 98 F (36.7 C) (Oral)  Ht 5' 2"  (1.575 m)  Wt 144 lb (65.318 kg)  BMI 26.33 kg/m2  Wt Readings from Last 3 Encounters:  05/30/15 144 lb (65.318 kg)  05/16/15  141 lb 12.8 oz (64.32 kg)  04/11/15 147 lb 8 oz (66.906 kg)    Physical Exam  Constitutional: She is oriented to person, place, and time. She appears well-developed and well-nourished. No distress.  HENT:  Head: Normocephalic and atraumatic.  Mouth/Throat: Oropharynx is clear and moist.  Eyes: Conjunctivae and EOM are normal. Pupils are equal, round, and reactive to light. Right eye exhibits no discharge. Left eye exhibits no discharge. No scleral icterus.  Direct ophthalmoscope exam of fundus limited with non-dilated pupils and known cataracts. No obvious DM retinopathy or papilledema but limited view of disc  Neck: Normal range of motion. Neck supple.  Cardiovascular: Normal rate, regular rhythm, normal heart sounds and intact distal pulses.   No murmur heard. Lymphadenopathy:    She has no cervical adenopathy.  Neurological: She is alert and oriented to person, place, and time.  Frequent mouth / lip movements, stable chronically with tardive dyskinesia  Skin: Skin is warm and dry. She is not  diaphoretic.  Psychiatric: She has a normal mood and affect. Her behavior is normal. Judgment and thought content normal.  Nursing note and vitals reviewed.  Results for orders placed or performed in visit on 44/62/86  BASIC METABOLIC PANEL WITH GFR  Result Value Ref Range   Sodium 136 135 - 146 mmol/L   Potassium 4.5 3.5 - 5.3 mmol/L   Chloride 97 (L) 98 - 110 mmol/L   CO2 29 20 - 31 mmol/L   Glucose, Bld 271 (H) 65 - 99 mg/dL   BUN 29 (H) 7 - 25 mg/dL   Creat 1.43 (H) 0.50 - 0.99 mg/dL   Calcium 10.2 8.6 - 10.4 mg/dL   GFR, Est African American 46 (L) >=60 mL/min   GFR, Est Non African American 40 (L) >=60 mL/min      Assessment & Plan:   Problem List Items Addressed This Visit    Acute kidney injury ([place])    Suspected pre-renal AKI in setting mild dehydration, reduced appetite/poor hydration, DM with elevated sugars osmotic diuresis. likely cause of dizziness. - baseline Cr 1.15 possibly higher (2015)  Plan: 1. Check BMET - results with elevated Cr 1.43 with BUN:Cr >20, may have some degree of gradual chronic worsening inc Cr, unsure accurate baseline at this time. 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 Called patient after visit, advised to hold Lisinopril-HCTZ, improve hydration, no NSAIDs, may take Tylenol. Ordered future re-check BMET, to schedule follow-up within 1-2 weeks with provider or lab draw.      Relevant Orders   BASIC METABOLIC PANEL WITH GFR   Cataracts, bilateral    Persistent bilateral cataracts and blurred vision with uncontrolled DM  Plan: 1. Continue to attempt to follow-up with Ophtho January 2017, had to move apt after recent referral due to unable to afford $50 co-pay. Will go to [employer].      DM (diabetes mellitus), type 2, uncontrolled ([place]) - Primary    Seems to be improving DM control now adhering to insulin regimen. Last A1c 10.5 (03/2015) not due yet. - complicated by cataracts, peripheral neuropathy, DM gastroparesis - referred to ophtho [facility],  however unable to afford copay at this time, follow-up 06/2015  Plan:  1. Continue Lantus to 40u daily, has not needed Humalog recently. No CBG logs to adjust today, will avoid increasing to reduce hypoglycemia given persistent poor appetite. - Check BMET today 2. Has DM testing supplies - but now reports issue at pharmacy with [employer] not authorizing DM  supplies, advised her to contact us with further information if still not able to pick up. 3. Lifestyle Mods - Encouraged improved DM diet with meals 3x daily and improved hydration, walking exercise 4. RTC 2 month for A1c      Relevant Orders   BASIC METABOLIC PANEL WITH GFR (Completed)   Gastroparesis   Low back pain    Resolved recent back pain. Now with some Left upper mid rib / flank pain. No injury or trauma. Suspected MSK pain secondary to recent coughing with COPD exacerbation.  Plan: 1. Reassurance, will take time 2. May continue Tizanidine 3. 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 likely cause of dizziness. Also AKI with Cr 1.43. Hold ACEi-HCTZ.    Rib pain on left side        Need for Tdap vaccination           No orders of the defined types were placed in this encounter.      Follow up plan: Return in about 2 months (around 07/31/2015) for diabetes.  [name], [place], PGY-3

## 2015-05-31 DIAGNOSIS — N179 Acute kidney failure, unspecified: Secondary | ICD-10-CM | POA: Insufficient documentation

## 2015-05-31 LAB — BASIC METABOLIC PANEL WITH GFR
BUN: 29 mg/dL — AB (ref 7–25)
CO2: 29 mmol/L (ref 20–31)
Calcium: 10.2 mg/dL (ref 8.6–10.4)
Chloride: 97 mmol/L — ABNORMAL LOW (ref 98–110)
Creat: 1.43 mg/dL — ABNORMAL HIGH (ref 0.50–0.99)
GFR, EST AFRICAN AMERICAN: 46 mL/min — AB (ref >=60)
GFR, EST NON AFRICAN AMERICAN: 40 mL/min — AB (ref >=60)
Glucose, Bld: 271 mg/dL — ABNORMAL HIGH (ref 65–99)
POTASSIUM: 4.5 mmol/L (ref 3.5–5.3)
SODIUM: 136 mmol/L (ref 135–146)

## 2015-05-31 NOTE — Assessment & Plan Note (Addendum)
Suspected pre-renal AKI in setting mild dehydration, reduced appetite/poor hydration, DM with elevated sugars osmotic diuresis. likely cause of dizziness. - baseline Cr 1.15 possibly higher (2015)  Plan: 1. Check BMET - results with elevated Cr 1.43 with BUN:Cr >20, may have some degree of gradual chronic worsening inc Cr, unsure accurate baseline at this time. 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 Called patient after visit, advised to hold Lisinopril-HCTZ, improve hydration, no NSAIDs, may take Tylenol. Ordered future re-check BMET, to schedule follow-up within 1-2 weeks with provider or lab draw.

## 2015-05-31 NOTE — Assessment & Plan Note (Signed)
Resolved recent back pain. Now with some Left upper mid rib / flank pain. No injury or trauma. Suspected MSK pain secondary to recent coughing with COPD exacerbation.  Plan: 1. Reassurance, will take time 2. May continue Tizanidine 3. Try heating pad, Tylenol PRN

## 2015-05-31 NOTE — Assessment & Plan Note (Signed)
Persistent bilateral cataracts and blurred vision with uncontrolled DM  Plan: 1. Continue to attempt to follow-up with Ophtho January 2017, had to move apt after recent referral due to unable to afford $50 co-pay. Will go to [facility].

## 2015-05-31 NOTE — Assessment & Plan Note (Addendum)
Seems to be improving DM control now adhering to insulin regimen. Last A1c 10.5 (03/2015) not due yet. - complicated by cataracts, peripheral neuropathy, DM gastroparesis - referred to ophtho [facility], however unable to afford copay at this time, follow-up 06/2015  Plan:  1. Continue Lantus to 40u daily, has not needed Humalog recently. No CBG logs to adjust today, will avoid increasing to reduce hypoglycemia given persistent poor appetite. - Check BMET today 2. Has DM testing supplies - but now reports issue at pharmacy with [employer] not authorizing DM supplies, advised her to contact us with further information if still not able to pick up. 3. Lifestyle Mods - Encouraged improved DM diet with meals 3x daily and improved hydration, walking exercise 4. RTC 2 month for A1c

## 2015-06-13 ENCOUNTER — Encounter: Payer: Self-pay | Admitting: Student

## 2015-06-13 ENCOUNTER — Ambulatory Visit (INDEPENDENT_AMBULATORY_CARE_PROVIDER_SITE_OTHER): Payer: Medicare Other | Admitting: Student

## 2015-06-13 VITALS — BP 146/88 | HR 105 | Temp 98.3°F | Wt 140.7 lb

## 2015-06-13 DIAGNOSIS — R059 Cough, unspecified: Secondary | ICD-10-CM | POA: Insufficient documentation

## 2015-06-13 DIAGNOSIS — R0982 Postnasal drip: Secondary | ICD-10-CM

## 2015-06-13 DIAGNOSIS — I10 Essential (primary) hypertension: Secondary | ICD-10-CM | POA: Diagnosis not present

## 2015-06-13 DIAGNOSIS — R05 Cough: Secondary | ICD-10-CM

## 2015-06-13 DIAGNOSIS — K2100 Gastro-esophageal reflux disease with esophagitis, without bleeding: Secondary | ICD-10-CM

## 2015-06-13 DIAGNOSIS — R7989 Other specified abnormal findings of blood chemistry: Secondary | ICD-10-CM

## 2015-06-13 DIAGNOSIS — R748 Abnormal levels of other serum enzymes: Secondary | ICD-10-CM | POA: Diagnosis not present

## 2015-06-13 LAB — BASIC METABOLIC PANEL WITH GFR
BUN: 14 mg/dL (ref 7–25)
CALCIUM: 9.6 mg/dL (ref 8.6–10.4)
CHLORIDE: 104 mmol/L (ref 98–110)
CO2: 26 mmol/L (ref 20–31)
CREATININE: 0.81 mg/dL (ref 0.50–0.99)
GFR, EST NON AFRICAN AMERICAN: 79 mL/min (ref >=60)
GFR, Est African American: >89 mL/min (ref >=60)
Glucose, Bld: 89 mg/dL (ref 65–99)
Potassium: 4 mmol/L (ref 3.5–5.3)
SODIUM: 141 mmol/L (ref 135–146)

## 2015-06-13 MED ORDER — LISINOPRIL-HYDROCHLOROTHIAZIDE 20-12.5 MG PO TABS: 1.0000 | ORAL_TABLET | Freq: Every day | ORAL | Status: DC

## 2015-06-13 MED ORDER — CETIRIZINE HCL 10 MG PO TABS: 10.0000 mg | ORAL_TABLET | Freq: Every day | ORAL | Status: DC

## 2015-06-13 MED ORDER — PANTOPRAZOLE SODIUM 40 MG PO TBEC: 40.0000 mg | DELAYED_RELEASE_TABLET | Freq: Every day | ORAL | Status: DC

## 2015-06-13 NOTE — Assessment & Plan Note (Signed)
Blood pressure 159/87 today. She hasn't been taking her lisinopril-hctz. She says she was told to stop taking it when her creatinine was 1.43 about two weeks. She reports taking her other medications. Restarted lisinipril-hctz 20/12.5 mg daily (she was two 20/12.5 mg daily before two weeks) for renal protection given history of DM. It is also unclear if the increase in her creatinine is acute or chronic. It is also unclear if this was due to medication or diabetes. Ordered BMP today. Advised to return in two weeks for blood pressure check and repeat BMP.

## 2015-06-13 NOTE — Progress Notes (Signed)
   Subjective:    Patient ID: [name], female    DOB: [birth year], 59 y.o.   MRN: [id number]  HPI  Cough: dry. For a week. Has runny nose and nasal dripping for one week as well. No fever. No chest pain. No trouble breathing. She also has history of GERD. She says she takes acid reducer over the counter. Don't remember taking protonix although it is on her medication list. Denies progression or improvement of cough. She is smoker.  Hypertension: stopped taking her lisinopril about two weeks ago. She says she was told to stop taking it because her kidney function was high. Her creatinine was 1.43 at that time. She also has history of diabetes and uses lantus.  Review of Systems Per HPI    Objective:   Physical Exam Filed Vitals:   06/13/15 1520 06/13/15 1613 06/13/15 1614  BP: 159/87 160/74 146/88  Pulse: 79 105   Temp: 98.3 F (36.8 C)    TempSrc: Oral    Weight: 140 lb 11.2 oz (63.821 kg)    Gen: appears well Nares: clear, no erythema, swelling or congestion Oropharynx: clear, moist Neck: supple, no LAD CV: regular rate and rythm. S1 & S2 audible Resp: no apparent work of breathing, clear to auscultation bilaterally.    Assessment & Plan:  Cough This is likely some viral infection with post nasal dripping. She also has history of GERD and endorses reflux and epigastric pain (burning) especially with spicy meal. Unlikely to be pneumonia or cardiac source based on her presentation or exam. Gave prescription for cetrizine and Protonix today. Discussed return precautions as well.  Hypertension Blood pressure 159/87 today. She hasn't been taking her lisinopril-hctz. She says she was told to stop taking it when her creatinine was 1.43 about two weeks. She reports taking her other medications. Restarted lisinipril-hctz 20/12.5 mg daily (she was two 20/12.5 mg daily before two weeks) for renal protection given history of DM. It is also unclear if the increase in her creatinine is  acute or chronic. It is also unclear if this was due to medication or diabetes. Ordered BMP today. Advised to return in two weeks for blood pressure check and repeat BMP.

## 2015-06-13 NOTE — Assessment & Plan Note (Signed)
This is likely some viral infection with post nasal dripping. She also has history of GERD and endorses reflux and epigastric pain (burning) especially with spicy meal. Unlikely to be pneumonia or cardiac source based on her presentation or exam. Gave prescription for cetrizine and Protonix today. Discussed return precautions as well.

## 2015-06-13 NOTE — Patient Instructions (Signed)
It was great seeing you today! We have addressed the following issues today  1. Cough: this could be due to viral infection, postnasal drip or acid reflux. I have sent a prescription to your pharmacy. 2. Blood pressure: i have refilled your prescription for lisinopril-hydrochlorothizide. I also like to come back in two weeks to have your blood pressure and kidney function checked.    If we did any lab work today, and the results require attention, either me or my nurse will get in touch with you. If everything is normal, you will get a letter in mail. If you don't hear from [place] in two weeks, please give [place] a call. Otherwise, I look forward to talking with you again at our next visit. If you have any questions or concerns before then, please call the clinic at [phone].  Please bring all your medications to every doctors visit   Sign up for My Chart to have easy access to your labs results, and communication with your Primary care physician.    Please check-out at the front desk before leaving the clinic.   Take Care,

## 2015-06-14 ENCOUNTER — Encounter: Payer: Self-pay | Admitting: Student

## 2015-06-21 ENCOUNTER — Encounter (HOSPITAL_COMMUNITY): Payer: Medicare Other | Admitting: Psychiatry

## 2015-06-21 NOTE — Progress Notes (Signed)
This encounter was created in error - please disregard.

## 2015-06-27 ENCOUNTER — Ambulatory Visit: Payer: Medicare Other | Admitting: Family Medicine

## 2015-07-05 ENCOUNTER — Encounter: Payer: Self-pay | Admitting: Family Medicine

## 2015-07-05 ENCOUNTER — Ambulatory Visit (INDEPENDENT_AMBULATORY_CARE_PROVIDER_SITE_OTHER): Payer: Medicare Other | Admitting: Family Medicine

## 2015-07-05 ENCOUNTER — Other Ambulatory Visit: Payer: Self-pay

## 2015-07-05 ENCOUNTER — Encounter: Payer: Self-pay | Admitting: Internal Medicine

## 2015-07-05 VITALS — BP 130/78 | HR 102 | Temp 97.8°F | Ht 62.0 in | Wt 144.3 lb

## 2015-07-05 DIAGNOSIS — E1100 Type 2 diabetes mellitus with hyperosmolarity without nonketotic hyperglycemic-hyperosmolar coma (NKHHC): Secondary | ICD-10-CM | POA: Diagnosis not present

## 2015-07-05 DIAGNOSIS — N179 Acute kidney failure, unspecified: Secondary | ICD-10-CM

## 2015-07-05 DIAGNOSIS — Z1231 Encounter for screening mammogram for malignant neoplasm of breast: Secondary | ICD-10-CM

## 2015-07-05 DIAGNOSIS — F17200 Nicotine dependence, unspecified, uncomplicated: Secondary | ICD-10-CM

## 2015-07-05 DIAGNOSIS — I10 Essential (primary) hypertension: Secondary | ICD-10-CM

## 2015-07-05 LAB — BASIC METABOLIC PANEL WITH GFR
BUN: 17 mg/dL (ref 7–25)
CALCIUM: 10 mg/dL (ref 8.6–10.4)
CO2: 28 mmol/L (ref 20–31)
Chloride: 101 mmol/L (ref 98–110)
Creat: 0.65 mg/dL (ref 0.50–0.99)
Glucose, Bld: 227 mg/dL — ABNORMAL HIGH (ref 65–99)
POTASSIUM: 3.7 mmol/L (ref 3.5–5.3)
Sodium: 138 mmol/L (ref 135–146)

## 2015-07-05 LAB — POCT GLYCOSYLATED HEMOGLOBIN (HGB A1C): HEMOGLOBIN A1C: 9.5

## 2015-07-05 NOTE — Assessment & Plan Note (Signed)
Resolved. SCr down from 1.43 to 0.81 (06/13/15). Suspect cause was pre-renal / dehydration etiology. Less likely now progressive CKD with HTN/DM but still at risk for this. - Baseline Cr 0.8 to 1  Plan: 1. Re-check BMET today, since resumed Lisinopril (half dose 20mg  daily) 2. Avoid NSAIDs, can take Tylenol 3. Continue improved hydration 4. If Cr stable, will resume Lisinopril 40mg  daily (with lisinopril-hctz x 2 tabs)

## 2015-07-05 NOTE — Patient Instructions (Addendum)
Thank you for coming in to clinic today.  1. Diabetes - A1c is improved today from 10.5 down to 9.5. Good job! Keep up the good work. - Keep taking Lantus 40 units daily, on days you can't eat much for 24 hours, take only 20 units - Write down your blood sugar logs, bring to next appointment with meter - Try to go to [facility] again when you can afford the copay - keep up good work on hydration with water!  BP is improved today. Keep taking 1 of the BP pills a day for now, Will check blood work today for Kidney function. Will call you with results and when to start taking 2nd tablet for BP pills  Remember to schedule the mammogram!  Call [facility] to ask about scheduling future Colonoscopy Dr. Gatha Mayer,  Gastroenterologist Address: [address], [place], [place] [address] Phone: [phone]  (last done 06/2011)  Please schedule a follow-up appointment with Dr Parks Ranger in 3 months for Diabetes follow-up   Please schedule apt with Dr Valentina Lucks (pharmacy clinic for smoking cessation) anytime if you are interested in quitting smoking.  If you have any other questions or concerns, please feel free to call the clinic to contact me. You may also schedule an earlier appointment if necessary.  However, if your symptoms get significantly worse, please go to the Emergency Department to seek immediate medical attention.  [name], [place]

## 2015-07-05 NOTE — Assessment & Plan Note (Signed)
Gradual improving DM control, better adhering to insulin regimen and lifestyle changes (water, no sodas, and starting gym). A1c 9.5 (07/05/15) down from A1c 10.5 (123XX123) - complicated by cataracts, peripheral neuropathy, DM gastroparesis - referred to [facility]htho [facility], however unable to afford copay at this time still  Plan:  1. Continue Lantus to 40u daily. Still missing several doses on days when has no food to eat. Her appetite has improved but now issue with obtaining food. Advised she can use half dose at Lantus 20u if limited food as long as she checks CBGs. Caution to avoid hypoglycemia. Not using novolog, and should not use this if not eating. 2. Re-check BMET today 3. Lifestyle Mods - Encouraged improved DM diet with meals 3x daily and improved hydration (water, no soda), joined a gym start x 4 weekly with trainer, interested in quitting smoking 4. RTC 3 month for A1c

## 2015-07-05 NOTE — Assessment & Plan Note (Signed)
Initially elevated BP SBP 160, repeat manual BP 130/78. Controlled HTN. Resolved AKI, likely from pre-renal / dehydration. No complications   Plan:  1. Continue Lisinopril-HCTZ 20-12.5mg  (1 tab daily) for now at lower dose, continue Metoprolol-XL 50mg  daily 2. Re-check BMET today trend SCr since resume Lisinopril from 1.43 to 0.81 (06/13/15), if remains stable, then will notify patient to resume full dose of x 2 tabs of Lisinopril-HCTZ daily. 3. Lifestyle Mods - Counseled on smoking cessation interested in quitting, starting gym exercise with trainer, improve diet 4. Monitor BP at home or at drug store occasionally. 5. RTC 3 mo

## 2015-07-05 NOTE — Assessment & Plan Note (Signed)
Active smoker, 0.5ppd, has reduced some Counseled on smoking cessation. Advised to follow-up with Dr Valentina Lucks for further discussion in pharmacy clinic.

## 2015-07-05 NOTE — Progress Notes (Signed)
Subjective:    Patient ID: [name], female    DOB: 1955/09/29, 60 y.o.   MRN: [id number]  [name] is a 60 y.o. female presenting on 07/05/2015 for Follow-up  HPI  FOLLOW-UP AKI, with elevated Serum Cr: - Last visit with me 05/30/15, had BMET done with elevated SCr up to 1.43 from baseline prior 0.8 to 1.15. Although patient was dehydrated at the time. She followed up in 2 weeks on 12/28 had re-check BMET with significantly improved Cr to 0.81. Her Lisinopril-HCTZ 20-12.5 x 2 daily was held on 12/14, then resumed at 1 tab daily on 12/28. - Today she returns for follow-up. Reports doing well. Still taking 1 BP pill daily. Improved hydration, now drinks 7 cups water daily and some green tea, no longer sodas has discontinued these. - Does not take any NSAIDs - Improved appetite but still has difficulties eating regularly with sometimes not having any food. - Denies any further dizziness episodes. Also denies vertigo, fatigue, lightheaded, pre-sycnope, decreased urination, dysuria.  CHRONIC HTN: Reports has not taken her BP med today x 2 days Current Meds - Lisinopril-HCTZ 20-12.5mg  x2 daily - now taking 1 pill daily as advised, Metoprolol-XL 50mg  daily Reports good compliance, did not take meds today. Tolerating well, w/o complaints. Denies CP, dyspnea, HA, edema, dizziness / lightheadedness  CHRONIC DM, Type 2: Reports - no concerns, thinks sugars still labile some, doesn't always eat at same time, doesn't always have food to eat at regular intervals, has had problems getting food from food pantry. Sometimes skips insulin dose. CBGs: Avg 130-200, Low 48, High 470s. Checks CBGs afternoon x1 daily Meds: Lantus 40u daily by 10am (see above, often skips daily lantus dose if not eating) Reports good compliance. Tolerating well w/o side-effects Currently on ACEi Lifestyle: Diet (working on improving appetite and eating regular, limited by food, goes to food pantry and shelter at  times) / Exercise (joined a gym, just established with a physical trainer 07/04/15, plans to go 4x weekly) Denies hypoglycemia, polyuria, visual changes, numbness or tingling.   TOBACCO ABUSE: - Chronic active smoker, 0.5ppd - Interested in quitting, no recent therapies tried.  Social History   Social History  . Marital Status: Married    Spouse Name: N/A  . Number of Children: N/A  . Years of Education: N/A   Occupational History  . CNA     full time at [employer] Topics  . Smoking status: Current Every Day Smoker -- 0.50 packs/day for 30 years    Types: Cigarettes  . Smokeless tobacco: Never Used     Comment: down to 8 cig/daily  . Alcohol Use: No  . Drug Use: No  . Sexual Activity:    Partners: Male   Other Topics Concern  . Not on file   Social History Narrative   Finances are a major issue. Meeting with [place]. Rejected by healthcare sharing initiative   currently receiving med assistance for lantus through our office and lyrica.      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 Pt in process of obtaining disability.     Review of Systems Per HPI unless specifically indicated above     Objective:    BP 130/78 mmHg  Pulse 102  Temp(Src) 97.8 F (36.6 C) (Oral)  Ht 5\' 2"  (1.575 m)  Wt 144 lb 4.8 oz (65.454 kg)  BMI 26.39 kg/m2  Wt Readings from Last 3 Encounters:  07/05/15 144 lb 4.8 oz (65.454 kg)  06/13/15 140 lb 11.2 oz (63.821 kg)  05/30/15 144 lb (65.318 kg)    Physical Exam  Constitutional: She appears well-developed and well-nourished. No distress.  Well-appearing, comfortable  HENT:  Head: Normocephalic and atraumatic.  Mouth/Throat: Oropharynx is clear and moist.  Cardiovascular: Normal rate, regular rhythm, normal heart sounds and intact distal pulses.   No murmur  heard. Pulmonary/Chest: Effort normal and breath sounds normal. No respiratory distress. She has no wheezes. She has no rales.  Neurological: She is alert.  Skin: Skin is warm and dry. She is not diaphoretic.  Nursing note and vitals reviewed.  Results for orders placed or performed in visit on 07/05/15  HgB A1c  Result Value Ref Range   Hemoglobin A1C 9.5       Assessment & Plan:   Problem List Items Addressed This Visit    RESOLVED: Acute kidney injury ([place])    Resolved. SCr down from 1.43 to 0.81 (06/13/15). Suspect cause was pre-renal / dehydration etiology. Less likely now progressive CKD with HTN/DM but still at risk for this. - Baseline Cr 0.8 to 1  Plan: 1. Re-check BMET today, since resumed Lisinopril (half dose 20mg  daily) 2. Avoid NSAIDs, can take Tylenol 3. Continue improved hydration 4. If Cr stable, will resume Lisinopril 40mg  daily (with lisinopril-hctz x 2 tabs)      Relevant Orders   BASIC METABOLIC PANEL WITH GFR   DM (diabetes mellitus), type 2, uncontrolled (HCC) - Primary    Gradual improving DM control, better adhering to insulin regimen and lifestyle changes (water, no sodas, and starting gym). A1c 9.5 (07/05/15) down from A1c 10.5 (123XX123) - complicated by cataracts, peripheral neuropathy, DM gastroparesis - referred to ophtho [facility], however unable to afford copay at this time still  Plan:  1. Continue Lantus to 40u daily. Still missing several doses on days when has no food to eat. Her appetite has improved but now issue with obtaining food. Advised she can use half dose at Lantus 20u if limited food as long as she checks CBGs. Caution to avoid hypoglycemia. Not using novolog, and should not use this if not eating. 2. Re-check BMET today 3. Lifestyle Mods - Encouraged improved DM diet with meals 3x daily and improved hydration (water, no soda), joined a gym start x 4 weekly with trainer, interested in quitting smoking 4. RTC 3 month for A1c       Relevant Orders   HgB A1c (Completed)   BASIC METABOLIC PANEL WITH GFR   Hypertension    Initially elevated BP SBP 160, repeat manual BP 130/78. Controlled HTN. Resolved AKI, likely from pre-renal / dehydration. No complications   Plan:  1. Continue Lisinopril-HCTZ 20-12.5mg  (1 tab daily) for now at lower dose, continue Metoprolol-XL 50mg  daily 2. Re-check BMET today trend SCr since resume Lisinopril from 1.43 to 0.81 (06/13/15), if remains stable, then will notify patient to resume full dose of x 2 tabs of Lisinopril-HCTZ daily. 3. Lifestyle Mods - Counseled on smoking cessation interested in quitting, starting gym exercise with trainer, improve diet 4. Monitor BP at home or at drug store occasionally. 5. RTC 3 mo       Relevant Orders   BASIC METABOLIC PANEL WITH GFR   TOBACCO USE    Active smoker, 0.5ppd, has reduced some Counseled on smoking cessation. Advised to  follow-up with Dr Valentina Lucks for further discussion in pharmacy clinic.         No orders of the defined types were placed in this encounter.    Health Maintenance: - Reminded to schedule Mammogram, given handout - Given info to call [facility] (Dr Carlean Purl) to schedule next due colonoscopy, 3 years, last was 06/2011 (x 3 polyps), I personally reviewed the last colonoscopy report today with patient. - No longer receiving pap smears, s/p hysterectomy with removal of cervix  Additionally, I gave the patient 2 cans of food (black beans and sweet peas) from our [facility] room with free food supply for patients as needed.  Follow up plan: Return in about 3 months (around 10/03/2015) for diabetes, blood pressure.  [name], [place], PGY-3

## 2015-07-06 ENCOUNTER — Telehealth: Payer: Self-pay | Admitting: Family Medicine

## 2015-07-06 NOTE — Telephone Encounter (Addendum)
Last office visit 07/05/15. Re-checked BMET to confirm resolved AKI. BMET results unremarkable, K 3.7, with serum creatinine 0.81 to 0.65, confirming that the Acute Kidney Injury as resolved.  I attempted to call patient back, did not reach her, but spoke to someone else who said she could call back in 10 minutes.  Patient did call back actually in a few minutes. Reviewed below recommendations.  1. BMET lab result yesterday was normal. Kidney function remains normal.  2. Recommend that she increase her dose of the Blood Pressure pill (Lisinopril-HCTZ) back to 2 tablets a day (same as she used to take before this problem).  3. Follow-up in 3 months for Diabetes, as discussed at yesterday office visit.  [name], [place], PGY-3

## 2015-07-23 ENCOUNTER — Ambulatory Visit
Admission: RE | Admit: 2015-07-23 | Discharge: 2015-07-23 | Disposition: A | Discharge status: Home / Self Care | Payer: Medicare Other | Source: Ambulatory Visit

## 2015-07-23 DIAGNOSIS — Z1231 Encounter for screening mammogram for malignant neoplasm of breast: Secondary | ICD-10-CM

## 2015-07-26 ENCOUNTER — Ambulatory Visit (HOSPITAL_COMMUNITY): Payer: Medicare Other | Admitting: Psychiatry

## 2015-08-15 ENCOUNTER — Other Ambulatory Visit: Payer: Self-pay | Admitting: Family Medicine

## 2015-08-15 DIAGNOSIS — IMO0002 Reserved for concepts with insufficient information to code with codable children: Secondary | ICD-10-CM

## 2015-08-15 DIAGNOSIS — E1165 Type 2 diabetes mellitus with hyperglycemia: Principal | ICD-10-CM

## 2015-08-15 DIAGNOSIS — E1142 Type 2 diabetes mellitus with diabetic polyneuropathy: Secondary | ICD-10-CM

## 2015-08-15 DIAGNOSIS — Z794 Long term (current) use of insulin: Principal | ICD-10-CM

## 2015-08-15 NOTE — Telephone Encounter (Signed)
Needs refill on lantus, [facility] on [address]

## 2015-08-16 MED ORDER — INSULIN GLARGINE 100 UNIT/ML ~~LOC~~ SOLN: SUBCUTANEOUS | Status: DC

## 2015-08-30 ENCOUNTER — Ambulatory Visit (AMBULATORY_SURGERY_CENTER): Payer: Self-pay | Admitting: *Deleted

## 2015-08-30 VITALS — Ht 62.0 in | Wt 150.0 lb

## 2015-08-30 DIAGNOSIS — Z86010 Personal history of colonic polyps: Secondary | ICD-10-CM

## 2015-08-30 NOTE — Progress Notes (Signed)
No egg or soy allergy. No anesthesia problems.  No home O2.  No diet meds.  

## 2015-08-31 ENCOUNTER — Telehealth: Payer: Self-pay | Admitting: Family Medicine

## 2015-08-31 DIAGNOSIS — K5900 Constipation, unspecified: Secondary | ICD-10-CM

## 2015-08-31 MED ORDER — POLYETHYLENE GLYCOL 3350 17 GM/SCOOP PO POWD
17.0000 g | Freq: Every day | ORAL | Status: DC | PRN
Start: 2015-08-31 — End: 2015-09-13

## 2015-08-31 NOTE — Telephone Encounter (Signed)
Pt called and needs a refill on her Miralax. She is needing this and she is also having a colonoscopy on the 27th so she will need this before then. jw

## 2015-08-31 NOTE — Telephone Encounter (Signed)
Refilled miralax today, she needs to follow-up with GI for colonoscopy and discuss bowel prep with them.  [name], [place], PGY-3

## 2015-09-12 ENCOUNTER — Telehealth: Payer: Self-pay | Admitting: Internal Medicine

## 2015-09-12 NOTE — Telephone Encounter (Signed)
Called pt back, she had questions about how to mix her miralax into her gatorade, pharmacist told her to use 14 cap fulls of the miralax, pt states the bottle states it has 255g in it but she was only able to get 8 capfuls out of it, pt states it was a brand new bottle never used before, advised pt as long as bottle had never been opened before and she used the whole bottle then it was ok, pt states understanding-adm

## 2015-09-13 ENCOUNTER — Encounter: Payer: Self-pay | Admitting: Internal Medicine

## 2015-09-13 ENCOUNTER — Ambulatory Visit (AMBULATORY_SURGERY_CENTER): Payer: Medicare Other | Admitting: Internal Medicine

## 2015-09-13 VITALS — BP 143/80 | HR 95 | Temp 98.0°F | Resp 20 | Ht 62.0 in | Wt 150.0 lb

## 2015-09-13 DIAGNOSIS — D120 Benign neoplasm of cecum: Secondary | ICD-10-CM

## 2015-09-13 DIAGNOSIS — D122 Benign neoplasm of ascending colon: Secondary | ICD-10-CM | POA: Diagnosis not present

## 2015-09-13 DIAGNOSIS — K635 Polyp of colon: Secondary | ICD-10-CM | POA: Diagnosis not present

## 2015-09-13 DIAGNOSIS — Z860100 Personal history of colon polyps, unspecified: Secondary | ICD-10-CM

## 2015-09-13 DIAGNOSIS — D124 Benign neoplasm of descending colon: Secondary | ICD-10-CM

## 2015-09-13 DIAGNOSIS — D123 Benign neoplasm of transverse colon: Secondary | ICD-10-CM | POA: Diagnosis not present

## 2015-09-13 HISTORY — DX: Personal history of colonic polyps: Z86.010

## 2015-09-13 HISTORY — DX: Personal history of colon polyps, unspecified: Z86.0100

## 2015-09-13 LAB — GLUCOSE, CAPILLARY: Glucose-Capillary: 181 mg/dL — ABNORMAL HIGH (ref 65–99)

## 2015-09-13 MED ORDER — SODIUM CHLORIDE 0.9 % IV SOLN: 500.0000 mL | INTRAVENOUS | Status: DC

## 2015-09-13 NOTE — Progress Notes (Signed)
A and O x 3 REport to RN

## 2015-09-13 NOTE — Patient Instructions (Addendum)
I found and removed 8 polyps today - all look benign.  I will let you know pathology results and when to have another routine colonoscopy by mail.  I appreciate the opportunity to care for you. Gatha Mayer, MD, FACG  YOU HAD AN ENDOSCOPIC PROCEDURE TODAY AT [facility]:   Refer to the procedure report that was given to you for any specific questions about what was found during the examination.  If the procedure report does not answer your questions, please call your gastroenterologist to clarify.  If you requested that your care partner not be given the details of your procedure findings, then the procedure report has been included in a sealed envelope for you to review at your convenience later.  YOU SHOULD EXPECT: Some feelings of bloating in the abdomen. Passage of more gas than usual.  Walking can help get rid of the air that was put into your GI tract during the procedure and reduce the bloating. If you had a lower endoscopy (such as a colonoscopy or flexible sigmoidoscopy) you may notice spotting of blood in your stool or on the toilet paper. If you underwent a bowel prep for your procedure, you may not have a normal bowel movement for a few days.  Please Note:  You might notice some irritation and congestion in your nose or some drainage.  This is from the oxygen used during your procedure.  There is no need for concern and it should clear up in a day or so.  SYMPTOMS TO REPORT IMMEDIATELY:   Following lower endoscopy (colonoscopy or flexible sigmoidoscopy):  Excessive amounts of blood in the stool  Significant tenderness or worsening of abdominal pains  Swelling of the abdomen that is new, acute  Fever of 100F or higher  For urgent or emergent issues, a gastroenterologist can be reached at any hour by calling [phone].   DIET: Your first meal following the procedure should be a small meal and then it is ok to progress to your normal diet. Heavy or  fried foods are harder to digest and may make you feel nauseous or bloated.  Likewise, meals heavy in dairy and vegetables can increase bloating.  Drink plenty of fluids but you should avoid alcoholic beverages for 24 hours.  ACTIVITY:  You should plan to take it easy for the rest of today and you should NOT DRIVE or use heavy machinery until tomorrow (because of the sedation medicines used during the test).    FOLLOW UP: Our staff will call the number listed on your records the next business day following your procedure to check on you and address any questions or concerns that you may have regarding the information given to you following your procedure. If we do not reach you, we will leave a message.  However, if you are feeling well and you are not experiencing any problems, there is no need to return our call.  We will assume that you have returned to your regular daily activities without incident.  If any biopsies were taken you will be contacted by phone or by letter within the next 1-3 weeks.  Please call us at [phone] if you have not heard about the biopsies in 3 weeks.    SIGNATURES/CONFIDENTIALITY: You and/or your care partner have signed paperwork which will be entered into your electronic medical record.  These signatures attest to the fact that that the information above on your After Visit Summary has been reviewed and  is understood.  Full responsibility of the confidentiality of this discharge information lies with you and/or your care-partner.

## 2015-09-13 NOTE — Progress Notes (Signed)
Called to room to assist during endoscopic procedure.  Patient ID and intended procedure confirmed with present staff. Received instructions for my participation in the procedure from the performing physician.  

## 2015-09-13 NOTE — Op Note (Signed)
[facility] Patient Name: [name] Procedure Date: 09/13/2015 1:25 PM MRN: [id number] Endoscopist: Gatha Mayer , MD Age: 60 Referring MD:  Date of Birth: May 20, 1956 Gender: Female Procedure:                Colonoscopy Indications:              Surveillance: Personal history of adenomatous                            polyps on last colonoscopy > 3 years ago Medicines:                Propofol total dose 300 mg IV, Monitored Anesthesia                            Care Procedure:                Pre-Anesthesia Assessment:                           - Prior to the procedure, a History and Physical                            was performed, and patient medications and                            allergies were reviewed. The patient's tolerance of                            previous anesthesia was also reviewed. The risks                            and benefits of the procedure and the sedation                            options and risks were discussed with the patient.                            All questions were answered, and informed consent                            was obtained. Prior Anticoagulants: The patient has                            taken no previous anticoagulant or antiplatelet                            agents. ASA Grade Assessment: II - A patient with                            mild systemic disease. After reviewing the risks                            and benefits, the patient was deemed in  satisfactory condition to undergo the procedure.                           After obtaining informed consent, the colonoscope                            was passed under direct vision. Throughout the                            procedure, the patient's blood pressure, pulse, and                            oxygen saturations were monitored continuously. The                            Model CF-HQ190L [id number]) scope was introduced                 through the anus and advanced to the the cecum,                            identified by appendiceal orifice and ileocecal                            valve. The colonoscopy was somewhat difficult due                            to a tortuous colon. Successful completion of the                            procedure was aided by applying abdominal pressure.                            The patient tolerated the procedure well. The                            quality of the bowel preparation was excellent. The                            ileocecal valve, appendiceal orifice, and rectum                            were photographed. Scope In: 1:38:48 PM Scope Out: 2:08:15 PM Scope Withdrawal Time: 0 hours 20 minutes 2 seconds  Total Procedure Duration: 0 hours 29 minutes 27 seconds  Findings:      The perianal and digital rectal examinations were normal.      Eight sessile polyps were found in the sigmoid colon, descending colon,       transverse colon, ascending colon and cecum. The polyps were 2 to 10 mm       in size. These polyps were removed with a cold snare. Polyp resection       was incomplete, and the resected tissue was partially retrieved.       Verification of patient identification for the specimen was done.       Estimated blood loss was minimal.  Diverticula were found in the sigmoid colon.      The exam was otherwise without abnormality on direct and retroflexion       views. Complications:            No immediate complications. Estimated Blood Loss:     Estimated blood loss was minimal. Impression:               - Eight 2 to 10 mm polyps in the sigmoid colon, in                            the descending colon, in the transverse colon, in                            the ascending colon and in the cecum, removed with                            a cold snare. Polyp resection was incomplete, and                            the resected tissue was partially retrieved.                            - The examination was otherwise normal on direct                            and retroflexion views. Recommendation:           - Patient has a contact number available for                            emergencies. The signs and symptoms of potential                            delayed complications were discussed with the                            patient. Return to normal activities tomorrow.                            Written discharge instructions were provided to the                            patient.                           - Resume previous diet.                           - Continue present medications.                           - Repeat colonoscopy is recommended. The                            colonoscopy date will be determined after pathology  results from today's exam become available for                            review. Procedure Code(s):        --- Professional ---                           [phone], Colonoscopy, flexible; with removal of                            tumor(s), polyp(s), or other lesion(s) by snare                            technique CPT copyright 2016 [employer]. All rights reserved. Gatha Mayer, MD 09/13/2015 2:18:40 PM This report has been signed electronically. Number of Addenda: 0 Referring MD:      Olin Hauser

## 2015-09-14 ENCOUNTER — Telehealth: Payer: Self-pay

## 2015-09-14 NOTE — Telephone Encounter (Signed)
  Follow up Call-  Call back number 09/13/2015  Post procedure Call Back phone  # [phone]  Permission to leave phone message Yes    Patient was called for follow up after her procedure on 09/13/2015. No answer at the number given for follow up phone call. A message was left on the answering machine.

## 2015-09-18 ENCOUNTER — Other Ambulatory Visit: Payer: Self-pay | Admitting: Family Medicine

## 2015-09-18 DIAGNOSIS — I10 Essential (primary) hypertension: Secondary | ICD-10-CM

## 2015-09-20 ENCOUNTER — Encounter: Payer: Self-pay | Admitting: Internal Medicine

## 2015-09-20 DIAGNOSIS — Z86010 Personal history of colonic polyps: Secondary | ICD-10-CM

## 2015-09-20 NOTE — Progress Notes (Signed)
Quick Note:  7 adenomas max 10 mm, 1 polyp (8th) was not recovered Recall 2020 ______

## 2015-10-01 ENCOUNTER — Ambulatory Visit: Payer: Medicare Other | Admitting: Family Medicine

## 2015-10-04 ENCOUNTER — Encounter: Payer: Self-pay | Admitting: Family Medicine

## 2015-10-04 ENCOUNTER — Ambulatory Visit (INDEPENDENT_AMBULATORY_CARE_PROVIDER_SITE_OTHER): Payer: Medicare Other | Admitting: Family Medicine

## 2015-10-04 VITALS — BP 144/68 | HR 109 | Temp 98.5°F | Ht 62.0 in | Wt 145.0 lb

## 2015-10-04 DIAGNOSIS — E1169 Type 2 diabetes mellitus with other specified complication: Secondary | ICD-10-CM | POA: Diagnosis not present

## 2015-10-04 DIAGNOSIS — R630 Anorexia: Secondary | ICD-10-CM | POA: Diagnosis not present

## 2015-10-04 DIAGNOSIS — R0781 Pleurodynia: Secondary | ICD-10-CM

## 2015-10-04 DIAGNOSIS — E785 Hyperlipidemia, unspecified: Secondary | ICD-10-CM

## 2015-10-04 DIAGNOSIS — F3341 Major depressive disorder, recurrent, in partial remission: Secondary | ICD-10-CM

## 2015-10-04 DIAGNOSIS — F3189 Other bipolar disorder: Secondary | ICD-10-CM

## 2015-10-04 DIAGNOSIS — E1100 Type 2 diabetes mellitus with hyperosmolarity without nonketotic hyperglycemic-hyperosmolar coma (NKHHC): Secondary | ICD-10-CM | POA: Diagnosis not present

## 2015-10-04 LAB — POCT GLYCOSYLATED HEMOGLOBIN (HGB A1C): Hemoglobin A1C: 8.8

## 2015-10-04 LAB — LIPID PANEL
Cholesterol: 189 mg/dL (ref 125–200)
HDL: 50 mg/dL (ref >=46)
LDL CALC: 117 mg/dL (ref <130)
Total CHOL/HDL Ratio: 3.8 Ratio (ref <=5.0)
Triglycerides: 108 mg/dL (ref <150)
VLDL: 22 mg/dL (ref <30)

## 2015-10-04 MED ORDER — GLUCERNA SHAKE PO LIQD: ORAL | Status: DC

## 2015-10-04 NOTE — Patient Instructions (Addendum)
Thank you for coming in to clinic today.  1. Diabetes - A1c is improved today from 9.5 to 8.8 Good job! Keep up the good work. - Keep taking Lantus 40 units daily, on days you can't eat much for 24 hours, take only 20 units - Write down your blood sugar logs, bring to next appointment with meter - Try to go to [facility] again when you can afford the copay - keep up good work on hydration with water!  We will change dose of your Mirtazapine likely down to 15mg  instead of 30mg  to see if that helps stimulate appetite.   Glucerna meal supplement may not be covered, printed rx bring to pharmacy see if they can direct you to an alternative or if you can get a small amount of these.  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  If you have any other questions or concerns, please feel free to call the clinic to contact me. You may also schedule an earlier appointment if necessary.  However, if your symptoms get significantly worse, please go to the Emergency Department to seek immediate medical attention.  [name], [place]

## 2015-10-04 NOTE — Progress Notes (Signed)
Subjective:    Patient ID: [name], female    DOB: 1955/09/29, 60 y.o.   MRN: [id number]  [name] is a 60 y.o. female presenting on 10/04/2015 for Diabetes  HPI  CHRONIC DM, Type 2 / WEIGHT LOSS vs POOR APPETITE: Reports - continued irregular eating habits, due to poor appetite and limited resources for food. Concerned CBGs have increased recently, missing some insulin doses. Wt down 145 from 150. CBGs: Avg 150-200, Low 69, High < 400. Checks CBGs afternoon x1 daily (does not have log today) Meds: Lantus 40u daily by 10am (may skip if not eating) Reports good compliance. Tolerating well w/o side-effects Currently on ACEi Still exercising occasionally at gym. Denies hypoglycemia, polyuria, visual changes, numbness or tingling.   CHRONIC HTN: Reports no concerns today Current Meds - Lisinopril-HCTZ 20-12.5mg  x1 daily, Metoprolol-XL 50mg  daily Tolerating well, w/o complaints. Denies CP, dyspnea, HA, edema, dizziness / lightheadedness  LEFT LOWER RIB PAIN - Reports intermittent flares related to movement and positional with sharp stabbing left lower rib cage pains, seem off and on since 04/2015, seen for similar problem more with low back pain thought to be due to coughing and muscle strain. No recent injuries or new trauma. Worst with turning quickly sometimes. - Tries heating pad occasionally, taking Tizanidine without significant relief - Denies productive cough, fall, difficulty breathing  Social History  Substance Use Topics  . Smoking status: Current Every Day Smoker -- 0.50 packs/day for 30 years    Types: Cigarettes  . Smokeless tobacco: Never Used     Comment: down to 8 cig/daily, vapors  . Alcohol Use: 0.0 oz/week    0 Standard drinks or equivalent per week     Review of Systems Per HPI unless specifically indicated above     Objective:    BP 144/68 mmHg  Pulse 109  Temp(Src) 98.5 F (36.9 C) (Oral)  Ht 5\' 2"  (1.575 m)  Wt 145 lb (65.772 kg)   BMI 26.51 kg/m2  Wt Readings from Last 3 Encounters:  10/04/15 145 lb (65.772 kg)  09/13/15 150 lb (68.04 kg)  08/30/15 150 lb (68.04 kg)    Physical Exam  Constitutional: She appears well-developed and well-nourished. No distress.  Well-appearing, comfortable  HENT:  Mouth/Throat: Oropharynx is clear and moist.  Cardiovascular: Regular rhythm, normal heart sounds and intact distal pulses.   No murmur heard. Tachycardic  Pulmonary/Chest: Effort normal and breath sounds normal. No respiratory distress. She has no wheezes. She has no rales.  Abdominal: Soft. Bowel sounds are normal. She exhibits no distension and no mass. There is no tenderness. There is no rebound and no guarding.  Musculoskeletal: She exhibits no edema.  Left lower lateral ribcage without significant tenderness to palpation. No obvious bony deformity. Low back without paraspinal muscle spasm or tenderness to palpation.  Neurological: She is alert.  Skin: Skin is warm and dry. She is not diaphoretic.  Psychiatric: She has a normal mood and affect. Her behavior is normal.  Nursing note and vitals reviewed.  Results for orders placed or performed in visit on 10/04/15  Lipid panel  Result Value Ref Range   Cholesterol 189 125 - 200 mg/dL   Triglycerides 108 <150 mg/dL   HDL 50 >=46 mg/dL   Total CHOL/HDL Ratio 3.8 <=5.0 Ratio   VLDL 22 <30 mg/dL   LDL Cholesterol 117 <130 mg/dL  POCT HgB A1C (CPT 83036)  Result Value Ref Range   Hemoglobin A1C 8.8  Assessment & Plan:   Problem List Items Addressed This Visit    Rib pain on left side    Today stable. Suspected intermittent MSK pain as it is positional / movement related, similar coughing muscle strain months ago. No acute LBP.  Plan: 1. Continue heating pad, topical muscle rub, stretching, tizanidine PRN, Tylenol 2. Future consider return for OMT of ribs to see if can improve function      Poor appetite    Chronic problem, seems to intermittently  worsen, concern possible DM gastroparesis but without nausea / vomiting, complicated by limited resources financially to obtain food. Limits insulin adherence.  Plan: 1. Reduce Mirtazapine from 30 to 15mg  nightly to see if may help stimulate appetite more, alternatively if does not work may actually increase up to 45mg  see if max dose helps more, caution sedation 2. Rx for Glucerna x 1 meal shake daily - however not covered by ins, and concerned patient will be unable to get 3. Referral to Dr Jenne [facility] for MNT regarding poor appetite / DM 4. Future consider trial of medications for DM gastroparesis - prefer hold reglan given problems with Tardive dyskinesia, would consider erythromycin      Relevant Medications   feeding supplement, GLUCERNA SHAKE, ([place]) LIQD   Other Relevant Orders   Amb ref to Medical Nutrition Therapy-MNT   Other bipolar disorder ([place])   Relevant Medications   mirtazapine (REMERON) 15 MG tablet   Hyperlipidemia due to type 2 diabetes mellitus ([place])    Lipid panel today (fasting by report) LDL elevated to 117 from 90, otherwise rest of panel significantly improved, HDL to 50, TG stable 108 - Continue Atorvastatin 40mg  for now, consider future inc to 80      DM (diabetes mellitus), type 2, uncontrolled (HCC) - Primary    Significant continued improved control, now with A1c 8.8 from 9.5. Better adherence to insulin regimen, however concern some reduced A1c is from poor eating and wt loss. - Complicated by cataracts b/l, peripheral neuropathy, DM gastroparesis  Plan: 1. Continue Lantus 40u daily - challenging to adjust w/o cbg log and very poor dietary habits, caution hypoglycemia, adjust days no eating 2. Improve DM nutrition, referral to Dr Jenne [facility], continue routine exercise 3. Patient to schedule ophthal exam once able to afford copay - Dr Bing Plume 4. RTC 3 mo A1c      Relevant Medications   feeding supplement, GLUCERNA SHAKE, (GLUCERNA SHAKE) LIQD   Other  Relevant Orders   POCT HgB A1C (CPT 83036) (Completed)   Lipid panel (Completed)   Amb ref to Medical Nutrition Therapy-MNT   Depression, major, recurrent ([place])   Relevant Medications   mirtazapine (REMERON) 15 MG tablet      Meds ordered this encounter  Medications  . feeding supplement, GLUCERNA SHAKE, (GLUCERNA SHAKE) LIQD    Sig: Drink one bottle 237 mL daily between meals.    Dispense:  237 mL    Refill:  0    Please dispense quantity sufficient up to 1 month.  . mirtazapine (REMERON) 15 MG tablet    Sig: Take 1 tablet (15 mg total) by mouth at bedtime.    Dispense:  30 tablet    Refill:  2    Follow up plan: Return in about 3 months (around 01/03/2016) for diabetes.  [name], [place], PGY-3

## 2015-10-05 MED ORDER — MIRTAZAPINE 15 MG PO TABS: 15.0000 mg | ORAL_TABLET | Freq: Every day | ORAL | Status: DC

## 2015-10-05 NOTE — Assessment & Plan Note (Addendum)
Chronic problem, seems to intermittently worsen, concern possible DM gastroparesis but without nausea / vomiting, complicated by limited resources financially to obtain food. Limits insulin adherence.  Plan: 1. Reduce Mirtazapine from 30 to 15mg  nightly to see if may help stimulate appetite more, alternatively if does not work may actually increase up to 45mg  see if max dose helps more, caution sedation 2. Rx for Glucerna x 1 meal shake daily - however not covered by ins, and concerned patient will be unable to get 3. Referral to Dr Jenne Campus for MNT regarding poor appetite / DM 4. Future consider trial of medications for DM gastroparesis - prefer hold reglan given problems with Tardive dyskinesia, would consider erythromycin

## 2015-10-05 NOTE — Assessment & Plan Note (Signed)
Lipid panel today (fasting by report) LDL elevated to 117 from 90, otherwise rest of panel significantly improved, HDL to 50, TG stable 108 - Continue Atorvastatin 40mg  for now, consider future inc to 80

## 2015-10-05 NOTE — Assessment & Plan Note (Signed)
Today stable. Suspected intermittent MSK pain as it is positional / movement related, similar coughing muscle strain months ago. No acute LBP.  Plan: 1. Continue heating pad, topical muscle rub, stretching, tizanidine PRN, Tylenol 2. Future consider return for OMT of ribs to see if can improve function

## 2015-10-05 NOTE — Assessment & Plan Note (Addendum)
Significant continued improved control, now with A1c 8.8 from 9.5. Better adherence to insulin regimen, however concern some reduced A1c is from poor eating and wt loss. - Complicated by cataracts b/l, peripheral neuropathy, DM gastroparesis  Plan: 1. Continue Lantus 40u daily - challenging to adjust w/o cbg log and very poor dietary habits, caution hypoglycemia, adjust days no eating 2. Improve DM nutrition, referral to Dr Jenne Campus, continue routine exercise 3. Patient to schedule ophthal exam once able to afford copay - Dr Bing Plume 4. RTC 3 mo A1c

## 2015-10-30 ENCOUNTER — Encounter (HOSPITAL_BASED_OUTPATIENT_CLINIC_OR_DEPARTMENT_OTHER): Payer: Self-pay | Admitting: *Deleted

## 2015-10-30 ENCOUNTER — Emergency Department (HOSPITAL_BASED_OUTPATIENT_CLINIC_OR_DEPARTMENT_OTHER): Payer: Medicare Other

## 2015-10-30 ENCOUNTER — Inpatient Hospital Stay (HOSPITAL_BASED_OUTPATIENT_CLINIC_OR_DEPARTMENT_OTHER)
Admission: EM | Admit: 2015-10-30 | Discharge: 2015-11-01 | DRG: 683 | Disposition: A | Discharge status: Home / Self Care | Payer: Medicare Other | Attending: Family Medicine | Admitting: Family Medicine

## 2015-10-30 DIAGNOSIS — E1143 Type 2 diabetes mellitus with diabetic autonomic (poly)neuropathy: Secondary | ICD-10-CM | POA: Diagnosis not present

## 2015-10-30 DIAGNOSIS — K219 Gastro-esophageal reflux disease without esophagitis: Secondary | ICD-10-CM | POA: Diagnosis not present

## 2015-10-30 DIAGNOSIS — E11649 Type 2 diabetes mellitus with hypoglycemia without coma: Secondary | ICD-10-CM | POA: Diagnosis present

## 2015-10-30 DIAGNOSIS — Z87891 Personal history of nicotine dependence: Secondary | ICD-10-CM | POA: Diagnosis present

## 2015-10-30 DIAGNOSIS — E785 Hyperlipidemia, unspecified: Secondary | ICD-10-CM | POA: Diagnosis present

## 2015-10-30 DIAGNOSIS — I110 Hypertensive heart disease with heart failure: Secondary | ICD-10-CM | POA: Diagnosis present

## 2015-10-30 DIAGNOSIS — Z794 Long term (current) use of insulin: Secondary | ICD-10-CM | POA: Diagnosis not present

## 2015-10-30 DIAGNOSIS — D649 Anemia, unspecified: Secondary | ICD-10-CM | POA: Diagnosis present

## 2015-10-30 DIAGNOSIS — F3181 Bipolar II disorder: Secondary | ICD-10-CM | POA: Diagnosis present

## 2015-10-30 DIAGNOSIS — Z86010 Personal history of colonic polyps: Secondary | ICD-10-CM

## 2015-10-30 DIAGNOSIS — K3184 Gastroparesis: Secondary | ICD-10-CM | POA: Diagnosis present

## 2015-10-30 DIAGNOSIS — I5032 Chronic diastolic (congestive) heart failure: Secondary | ICD-10-CM | POA: Diagnosis present

## 2015-10-30 DIAGNOSIS — I10 Essential (primary) hypertension: Secondary | ICD-10-CM | POA: Diagnosis present

## 2015-10-30 DIAGNOSIS — E1169 Type 2 diabetes mellitus with other specified complication: Secondary | ICD-10-CM | POA: Diagnosis present

## 2015-10-30 DIAGNOSIS — R55 Syncope and collapse: Secondary | ICD-10-CM | POA: Diagnosis not present

## 2015-10-30 DIAGNOSIS — F3189 Other bipolar disorder: Secondary | ICD-10-CM | POA: Diagnosis present

## 2015-10-30 DIAGNOSIS — E1165 Type 2 diabetes mellitus with hyperglycemia: Secondary | ICD-10-CM | POA: Diagnosis present

## 2015-10-30 DIAGNOSIS — Z7982 Long term (current) use of aspirin: Secondary | ICD-10-CM | POA: Diagnosis not present

## 2015-10-30 DIAGNOSIS — I5042 Chronic combined systolic (congestive) and diastolic (congestive) heart failure: Secondary | ICD-10-CM | POA: Diagnosis present

## 2015-10-30 DIAGNOSIS — F17210 Nicotine dependence, cigarettes, uncomplicated: Secondary | ICD-10-CM | POA: Diagnosis present

## 2015-10-30 DIAGNOSIS — E1142 Type 2 diabetes mellitus with diabetic polyneuropathy: Secondary | ICD-10-CM | POA: Diagnosis present

## 2015-10-30 DIAGNOSIS — I959 Hypotension, unspecified: Secondary | ICD-10-CM | POA: Diagnosis present

## 2015-10-30 DIAGNOSIS — N179 Acute kidney failure, unspecified: Secondary | ICD-10-CM | POA: Diagnosis not present

## 2015-10-30 DIAGNOSIS — F17200 Nicotine dependence, unspecified, uncomplicated: Secondary | ICD-10-CM | POA: Diagnosis present

## 2015-10-30 DIAGNOSIS — E860 Dehydration: Secondary | ICD-10-CM | POA: Diagnosis not present

## 2015-10-30 DIAGNOSIS — R531 Weakness: Secondary | ICD-10-CM | POA: Diagnosis not present

## 2015-10-30 DIAGNOSIS — IMO0002 Reserved for concepts with insufficient information to code with codable children: Secondary | ICD-10-CM

## 2015-10-30 LAB — CBC WITH DIFFERENTIAL/PLATELET
Basophils Absolute: 0 10*3/uL (ref 0.0–0.1)
Basophils Relative: 0 %
EOS ABS: 0.3 10*3/uL (ref 0.0–0.7)
EOS PCT: 3 %
HCT: 33.8 % — ABNORMAL LOW (ref 36.0–46.0)
HEMOGLOBIN: 11.4 g/dL — AB (ref 12.0–15.0)
LYMPHS ABS: 5.3 10*3/uL — AB (ref 0.7–4.0)
Lymphocytes Relative: 51 %
MCH: 30.3 pg (ref 26.0–34.0)
MCHC: 33.7 g/dL (ref 30.0–36.0)
MCV: 89.9 fL (ref 78.0–100.0)
MONO ABS: 1 10*3/uL (ref 0.1–1.0)
MONOS PCT: 9 %
Neutro Abs: 3.9 10*3/uL (ref 1.7–7.7)
Neutrophils Relative %: 37 %
PLATELETS: 302 10*3/uL (ref 150–400)
RBC: 3.76 MIL/uL — ABNORMAL LOW (ref 3.87–5.11)
RDW: 13.3 % (ref 11.5–15.5)
WBC: 10.5 10*3/uL (ref 4.0–10.5)

## 2015-10-30 LAB — URINALYSIS, ROUTINE W REFLEX MICROSCOPIC
Glucose, UA: >1000 mg/dL — AB
Hgb urine dipstick: NEGATIVE
KETONES UR: 15 mg/dL — AB
LEUKOCYTES UA: NEGATIVE
NITRITE: NEGATIVE
PH: 5 (ref 5.0–8.0)
PROTEIN: 30 mg/dL — AB
Specific Gravity, Urine: 1.025 (ref 1.005–1.030)

## 2015-10-30 LAB — TROPONIN I: Troponin I: <0.03 ng/mL (ref <0.031)

## 2015-10-30 LAB — COMPREHENSIVE METABOLIC PANEL
ALK PHOS: 112 U/L (ref 38–126)
ALT: 11 U/L — ABNORMAL LOW (ref 14–54)
ANION GAP: 10 (ref 5–15)
AST: 18 U/L (ref 15–41)
Albumin: 4.1 g/dL (ref 3.5–5.0)
BUN: 41 mg/dL — ABNORMAL HIGH (ref 6–20)
CALCIUM: 10.1 mg/dL (ref 8.9–10.3)
CO2: 27 mmol/L (ref 22–32)
Chloride: 102 mmol/L (ref 101–111)
Creatinine, Ser: 2.84 mg/dL — ABNORMAL HIGH (ref 0.44–1.00)
GFR calc non Af Amer: 17 mL/min — ABNORMAL LOW (ref >60)
GFR, EST AFRICAN AMERICAN: 20 mL/min — AB (ref >60)
Glucose, Bld: 166 mg/dL — ABNORMAL HIGH (ref 65–99)
Potassium: 4.4 mmol/L (ref 3.5–5.1)
SODIUM: 139 mmol/L (ref 135–145)
Total Bilirubin: 0.5 mg/dL (ref 0.3–1.2)
Total Protein: 8.2 g/dL — ABNORMAL HIGH (ref 6.5–8.1)

## 2015-10-30 LAB — URINE MICROSCOPIC-ADD ON

## 2015-10-30 LAB — CBG MONITORING, ED: Glucose-Capillary: 197 mg/dL — ABNORMAL HIGH (ref 65–99)

## 2015-10-30 MED ORDER — SODIUM CHLORIDE 0.9 % IV BOLUS (SEPSIS)
1000.0000 mL | Freq: Once | INTRAVENOUS | Status: AC
  Administered 2015-10-30: 1000 mL via INTRAVENOUS

## 2015-10-30 MED ORDER — SODIUM CHLORIDE 0.9 % IV SOLN: INTRAVENOUS | Status: AC

## 2015-10-30 MED ORDER — SODIUM CHLORIDE 0.9 % IV BOLUS (SEPSIS): 1000.0000 mL | Freq: Once | INTRAVENOUS | Status: AC

## 2015-10-30 NOTE — H&P (Signed)
[facility] Admission History and Physical Service Pager: [phone]  Patient name: [name] Medical record number: [id number] Date of birth: 1956/06/14 Age: 60 y.o. Gender: female  Primary Care Provider: [name], DO Consultants: none Code Status: FULL (discussed on admission)  Chief Complaint: malaise  Assessment and Plan: [name] is a 60 y.o. female presenting with generalized malaise and feeling poorly . PMH is significant for T2DM, HTN, HLD, Mood disorder/ anorexia, peripheral neuropathy, gastroparesis,   # Acute kidney injury: Cr at [facility] 2.84 (baseline 0.6-0.8).  Electrolytes stable.  BG 166.  UA with 30 protein, >1000 glucose, small bili, 15 ketones, no blood.  VSS.  S/p 2L bolus in ED.  ?Dehydration secondary to decreased PO vs post infectious (denies recent illness) vs secondary to chronic disease (uncontrolled DM/HTN).  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 - Admit to Med surg under Dr Erin Hearing - VS per floor protocol - Urine Na, Urine Cr, ASO and ANA - renal function panel in am - cbc in am - renal ultrasound ordered - Hold ACE/Hctz - Strict I/Os - Daily weights - MIVFs - will consider renal consult if no improvement w/ IVFs  # T2DM/ neuropathy: Last A1c 8.8 (09/2015). BG in ED 166.  On Lantus 40u qd. - hold lantus for now in the setting of hypoglycemia to 30s upon arrival - sSSI for now - Continue Neurontin 122m qid - monitor BG - HH/ carb mod diet  # HTN/ Chronic Diastolic CHF: normotensive on admission.  Last echo 06/2013 w/ EF 60-65%, severe LVH.  Was supposed to be followed by Dr TRadford Paxoutpatient but looks to have been lost to f/u.   On Lisinopril/ HCTZ, Toprol XL 50 outpatient - Monitor BP - Hold ACE/HCTZ - Will give Lopressor 219mbid while hospitalized   # HLD: on lipitor 40 at home - continue home lipitor at reduced  dose 20 mg  # Mood disorder/ poor appetite: on Remeron 1572mSeroquel 200m65m home - Continue home meds  # Tobacco use d/o: smokes 1/2 ppd - Nicotine 14 patch - continue smoking cessation counseling  FEN/GI: HH Carb mod diet Prophylaxis: Lovenox renally dosed  Disposition: Admit to med surg  History of Present Illness:  [name] 59 y25. female presenting with malaise  [facility]  She was found to have an AKI.  Patient denies NSAID use (incl Goody powder, ASA, Motrin, Aleve).  She denies N/V/D, recent URI illness.  She reports chronic decreased PO intake, though notes that she has been eating and drinking normally.  She notes that BP has been intermittently low (recorded a value of 70/40s at home but was normotensive in ED.  Additionally, she reports BG >400 earlier today.  She took her Lantus before coming to ED and it was in the 100s upon arrival.  Endorsing intermittent dizziness.  Her UOP is normal.  Denies dysuria, hematuria, dark urine.  Review Of Systems: Per HPI with the following additions: none Otherwise the remainder of the systems were negative.  Patient Active Problem List   Diagnosis Date Noted  . AKI (acute kidney injury) (HCC)Kyle/16/2017  . Rib pain on left side 10/04/2015  . Poor appetite 10/04/2015  . Personal history of colonic polyps 09/13/2015  . Cough 06/13/2015  . Tardive dyskinesia 06/19/2014  . Cataracts, bilateral 03/21/2014  . Constipation 02/13/2014  . Chronic diastolic  CHF (congestive heart failure) ([place]) 07/26/2013  . Low back pain 02/16/2013  . Dysphagia, unspecified(787.20) 09/08/2012  . Hypertension 08/12/2012  . Abnormal colonoscopy 07/17/2011  . Other bipolar disorder ([place]) 08/23/2009  . FIBROMYALGIA 08/06/2009  . Gastroparesis 03/22/2009  . Peripheral autonomic neuropathy due to DM ([place]) 01/29/2007  . DM (diabetes mellitus), type 2, uncontrolled ([place]) 08/18/2006  . TOBACCO USE 08/18/2006  .  Hyperlipidemia due to type 2 diabetes mellitus ([place]) 08/13/2006  . Depression, major, recurrent ([place]) 08/13/2006    Past Medical History: Past Medical History  Diagnosis Date  . DM (diabetes mellitus), type 2 ([place])   . Hypercholesteremia   . HTN (hypertension)   . Trigger finger     r index inj 12/08, bilateral thumb inj 11/09, r thumb and dequervain's inj 01/2009  . Bipolar 2 disorder ([place])   . Marijuana abuse   . Anxiety   . Arthritis   . Depression   . GERD (gastroesophageal reflux disease)   . Chest pain   . Allergy   . Cataract     Past Surgical History: Past Surgical History  Procedure Laterality Date  . Abdominal hysterectomy  1986    fibroids  . Cardiac catheterization  05/06/2004    normal LV fxn EF>60%  . Perirpheral iridectomy  02/12/2005  . Left heart catheterization with coronary angiogram N/A 05/26/2011    Procedure: LEFT HEART CATHETERIZATION WITH CORONARY ANGIOGRAM;  Surgeon: Larey Dresser, MD;  Location: [facility] C[facility] LAB;  Service: Cardiovascular;  Laterality: N/A;  radial  . Cystectomy      back of head    Social History: Social History  Substance Use Topics  . Smoking status: Current Every Day Smoker -- 0.50 packs/day for 30 years    Types: Cigarettes  . Smokeless tobacco: Never Used     Comment: down to 8 cig/daily, vapors  . Alcohol Use: 0.0 oz/week    0 Standard drinks or equivalent per week   Additional social history: none  Please also refer to relevant sections of EMR.  Family History: Family History  Problem Relation Age of Onset  . Diabetes Mother   . Heart disease Mother   . Heart attack Father   . Bipolar disorder Father   . Bipolar disorder Sister   . Colon cancer Neg Hx   . Esophageal cancer Neg Hx   . Bipolar disorder Brother    Allergies and Medications: Allergies  Allergen Reactions  . Ibuprofen Other (See Comments)    REACTION: chest tightness and feeling like something stuck in chest   No current facility-administered  medications on file prior to encounter.   Current Outpatient Prescriptions on File Prior to Encounter  Medication Sig Dispense Refill  . aspirin 81 MG tablet Take 1 tablet (81 mg total) by mouth daily. (Patient not taking: Reported on 08/30/2015) 30 tablet 11  . atorvastatin (LIPITOR) 40 MG tablet Take 1 tablet (40 mg total) by mouth daily. 90 tablet 3  . Blood Glucose Monitoring Suppl (ONE TOUCH ULTRA 2) W/DEVICE KIT 1 kit by Does not apply route once. 1 each 0  . feeding supplement, GLUCERNA SHAKE, (GLUCERNA SHAKE) LIQD Drink one bottle 237 mL daily between meals. 237 mL 0  . gabapentin (NEURONTIN) 100 MG capsule Take 1 capsule (100 mg total) by mouth 4 (four) times daily. 120 capsule 5  . glucose blood (ONE TOUCH ULTRA TEST) test strip Test 3 times a day. 100 each 12  . insulin glargine (LANTUS) 100 UNIT/ML injection INJECT  40 UNITS SUBCUTANEOUSLY ONCE DAILY 20 vial 5  . Lancets (ONETOUCH ULTRASOFT) lancets Use as instructed 100 each 12  . lisinopril-hydrochlorothiazide (PRINZIDE,ZESTORETIC) 20-12.5 MG tablet Take 1 tablet by mouth daily. 90 tablet 3  . metoprolol succinate (TOPROL-XL) 50 MG 24 hr tablet Take 1 tablet by mouth  daily with or immediately  following a meal 90 tablet 3  . mirtazapine (REMERON) 15 MG tablet Take 1 tablet (15 mg total) by mouth at bedtime. 30 tablet 2  . ONE TOUCH LANCETS MISC 1 Device by Does not apply route 3 (three) times daily. 200 each 6  . SEROQUEL XR 200 MG 24 hr tablet Take 1 tablet (200 mg total) by mouth at bedtime. 30 tablet 2  . tiZANidine (ZANAFLEX) 2 MG tablet Take 1 tablet (2 mg total) by mouth every 8 (eight) hours as needed for muscle spasms. 30 tablet 2    Objective: BP 115/69 mmHg  Pulse 95  Temp(Src) 98.7 F (37.1 C) (Oral)  Resp 16  Ht _0  (1.575 m)  Wt 146 lb (66.225 kg)  BMI 26.70 kg/m2  SpO2 100% Exam: General: awake, alert, well appearing female, NAD Eyes: EOMI, PERRLA ENTM: o/p clear, MMM (s/p red popsicle) Neck: no  JVD Cardiovascular: RRR, no murmurs Respiratory: CTAB, normal WOB on room air Abdomen: soft, NT/ND, +BS MSK: no edema.  WWP Skin: no rashes or lesions Neuro: follows commands, speech normal Psych: mood stable, affect appropriate  Labs and Imaging: CBC BMET   Recent Labs Lab 10/30/15 2220  WBC 10.5  HGB 11.4*  HCT 33.8*  PLT 302    Recent Labs Lab 10/30/15 2220  NA 139  K 4.4  CL 102  CO2 27  BUN 41*  CREATININE 2.84*  GLUCOSE 166*  CALCIUM 10.1     Urinalysis    Component Value Date/Time   COLORURINE YELLOW 10/30/2015 2225   APPEARANCEUR CLOUDY* 10/30/2015 2225   LABSPEC 1.025 10/30/2015 2225   PHURINE 5.0 10/30/2015 2225   GLUCOSEU >1000* 10/30/2015 2225   HGBUR NEGATIVE 10/30/2015 2225   HGBUR negative 06/18/2010 1354   BILIRUBINUR SMALL* 10/30/2015 2225   KETONESUR 15* 10/30/2015 2225   PROTEINUR 30* 10/30/2015 2225   UROBILINOGEN 0.2 08/12/2012 1835   NITRITE NEGATIVE 10/30/2015 2225   LEUKOCYTESUR NEGATIVE 10/30/2015 2225    No results found.  Janora Norlander, DO 10/30/2015, 11:30 PM PGY-2, [place] [facility] pager: [phone], text pages welcome

## 2015-10-30 NOTE — ED Provider Notes (Addendum)
CSN: [id number]     Arrival date & time 10/30/15  2140 History  By signing my name below, I, Evelene Croon, attest that this documentation has been prepared under the direction and in the presence of Blanchie Dessert, MD . Electronically Signed: Evelene Croon, Scribe. 10/30/2015. 10:01 PM.   Chief Complaint  Patient presents with  . Hyperglycemia   The history is provided by the patient. No language interpreter was used.    HPI Comments:  [name] is a 60 y.o. female with a history of DM, who presents to the Emergency Department complaining of hyperglycemia and hypotension since last night. She reports associated lightheadedness; states she felt like she was going to pass out so she checked her BS which was low. She then ate and rechecked it and it was in the 400s this AM. She has also being checking her BP today and she reports BP of 29/52 and systolic BPs in the 84X and 70s. She notes her BP is usually 324 systolic.  She is compliant with BP meds which she takes in the AM. She denies recent change in meds. Pt also notes generalized weakness x a few days. She denies abdominal pain, urinary symptoms, diarrhea, congestion, CP and SOB at this time.  She did have some SOB earlier that has resolved however this is not unusual for her. Pt is a current smoker.  She is worried she is dehydrated due to poor po intake.   Past Medical History  Diagnosis Date  . DM (diabetes mellitus), type 2 ([place])   . Hypercholesteremia   . HTN (hypertension)   . Trigger finger     r index inj 12/08, bilateral thumb inj 11/09, r thumb and dequervain's inj 01/2009  . Bipolar 2 disorder ([place])   . Marijuana abuse   . Anxiety   . Arthritis   . Depression   . GERD (gastroesophageal reflux disease)   . Chest pain   . Allergy   . Cataract    Past Surgical History  Procedure Laterality Date  . Abdominal hysterectomy  1986    fibroids  . Cardiac catheterization  05/06/2004    normal LV fxn EF>60%  .  Perirpheral iridectomy  02/12/2005  . Left heart catheterization with coronary angiogram N/A 05/26/2011    Procedure: LEFT HEART CATHETERIZATION WITH CORONARY ANGIOGRAM;  Surgeon: Larey Dresser, MD;  Location: [facility] CATH LAB;  Service: Cardiovascular;  Laterality: N/A;  radial  . Cystectomy      back of head   Family History  Problem Relation Age of Onset  . Diabetes Mother   . Heart disease Mother   . Heart attack Father   . Bipolar disorder Father   . Bipolar disorder Sister   . Colon cancer Neg Hx   . Esophageal cancer Neg Hx   . Bipolar disorder Brother    Social History  Substance Use Topics  . Smoking status: Current Every Day Smoker -- 0.50 packs/day for 30 years    Types: Cigarettes  . Smokeless tobacco: Never Used     Comment: down to 8 cig/daily, vapors  . Alcohol Use: 0.0 oz/week    0 Standard drinks or equivalent per week   OB History    No data available     Review of Systems  Constitutional: Negative for fever.       +Hyperglycemia +Hypertension  HENT: Negative for congestion.   Respiratory: Negative for shortness of breath.   Cardiovascular: Negative for chest pain.  Gastrointestinal: Negative for diarrhea.  Genitourinary: Negative for dysuria, urgency and frequency.  Neurological: Positive for weakness (generalized) and light-headedness. Negative for speech difficulty.  All other systems reviewed and are negative.   Allergies  Ibuprofen  Home Medications   Prior to Admission medications   Medication Sig Start Date End Date Taking? Authorizing Provider  aspirin 81 MG tablet Take 1 tablet (81 mg total) by mouth daily. Patient not taking: Reported on 08/30/2015 04/27/15   Olin Hauser, DO  atorvastatin (LIPITOR) 40 MG tablet Take 1 tablet (40 mg total) by mouth daily. 04/27/15   Olin Hauser, DO  Blood Glucose Monitoring Suppl (ONE TOUCH ULTRA 2) W/DEVICE KIT 1 kit by Does not apply route once. 04/11/15   Olin Hauser, DO   feeding supplement, GLUCERNA SHAKE, (GLUCERNA SHAKE) LIQD Drink one bottle 237 mL daily between meals. 10/04/15   Olin Hauser, DO  gabapentin (NEURONTIN) 100 MG capsule Take 1 capsule (100 mg total) by mouth 4 (four) times daily. 05/16/15   Olin Hauser, DO  glucose blood (ONE TOUCH ULTRA TEST) test strip Test 3 times a day. 04/11/15   Olin Hauser, DO  insulin glargine (LANTUS) 100 UNIT/ML injection INJECT 40 UNITS SUBCUTANEOUSLY ONCE DAILY 08/16/15   Olin Hauser, DO  Lancets [facility] ULTRASOFT) lancets Use as instructed 04/11/15   Olin Hauser, DO  lisinopril-hydrochlorothiazide (PRINZIDE,ZESTORETIC) 20-12.5 MG tablet Take 1 tablet by mouth daily. 06/13/15   Mercy Riding, MD  metoprolol succinate (TOPROL-XL) 50 MG 24 hr tablet Take 1 tablet by mouth  daily with or immediately  following a meal 09/18/15   Olin Hauser, DO  mirtazapine (REMERON) 15 MG tablet Take 1 tablet (15 mg total) by mouth at bedtime. 10/05/15   Olin Hauser, DO  ONE TOUCH LANCETS MISC 1 Device by Does not apply route 3 (three) times daily. 09/22/13   Melony Overly, MD  SEROQUEL XR 200 MG 24 hr tablet Take 1 tablet (200 mg total) by mouth at bedtime. 05/16/15   Olin Hauser, DO  tiZANidine (ZANAFLEX) 2 MG tablet Take 1 tablet (2 mg total) by mouth every 8 (eight) hours as needed for muscle spasms. 05/16/15   Devonne Doughty Karamalegos, DO   BP 115/69 mmHg  Pulse 95  Temp(Src) 98.7 F (37.1 C) (Oral)  Resp 16  Ht '5\' 2"'$  (1.575 m)  Wt 146 lb (66.225 kg)  BMI 26.70 kg/m2  SpO2 100% Physical Exam  Constitutional: She is oriented to person, place, and time. She appears well-developed and well-nourished. No distress.  HENT:  Head: Normocephalic and atraumatic.  Mouth/Throat: Mucous membranes are dry.  Eyes: EOM are normal.  Neck: Normal range of motion.  Cardiovascular: Normal rate, regular rhythm and normal heart sounds.   Pulmonary/Chest:  Effort normal and breath sounds normal.  Abdominal: Soft. She exhibits no distension. There is no tenderness.  Musculoskeletal: Normal range of motion.  Neurological: She is alert and oriented to person, place, and time.  Skin: Skin is warm and dry.  Psychiatric: She has a normal mood and affect. Judgment normal.  Nursing note and vitals reviewed.   ED Course  Procedures  DIAGNOSTIC STUDIES:  Oxygen Saturation is 100% on RA, normal by my interpretation.    COORDINATION OF CARE:  10:10 PM Discussed treatment plan with pt at bedside and pt agreed to plan.  Labs Review Labs Reviewed  CBC WITH DIFFERENTIAL/PLATELET - Abnormal; Notable for the following:    RBC  3.76 (*)    Hemoglobin 11.4 (*)    HCT 33.8 (*)    Lymphs Abs 5.3 (*)    All other components within normal limits  COMPREHENSIVE METABOLIC PANEL - Abnormal; Notable for the following:    Glucose, Bld 166 (*)    BUN 41 (*)    Creatinine, Ser 2.84 (*)    Total Protein 8.2 (*)    ALT 11 (*)    GFR calc non Af Amer 17 (*)    GFR calc Af Amer 20 (*)    All other components within normal limits  URINALYSIS, ROUTINE W REFLEX MICROSCOPIC (NOT AT [facility]) - Abnormal; Notable for the following:    APPearance CLOUDY (*)    Glucose, UA >1000 (*)    Bilirubin Urine SMALL (*)    Ketones, ur 15 (*)    Protein, ur 30 (*)    All other components within normal limits  URINE MICROSCOPIC-ADD ON - Abnormal; Notable for the following:    Squamous Epithelial / LPF 6-30 (*)    Bacteria, UA MANY (*)    Casts HYALINE CASTS (*)    All other components within normal limits  CBG MONITORING, ED - Abnormal; Notable for the following:    Glucose-Capillary 197 (*)    All other components within normal limits  TROPONIN I    Imaging Review No results found. I have personally reviewed and evaluated these images and lab results as part of my medical decision-making.   EKG Interpretation   Date/Time:  Tuesday Oct 30 2015 22:30:13  EDT Ventricular Rate:  87 PR Interval:  180 QRS Duration: 92 QT Interval:  380 QTC Calculation: 457 R Axis:   39 Text Interpretation:  Sinus rhythm Probable left atrial enlargement Left  ventricular hypertrophy Nonspecific T abnormalities, lateral leads No  significant change since last tracing Confirmed by Maryan Rued  MD, Loree Fee  [phone]) on 10/30/2015 11:14:10 PM      MDM   Final diagnoses:  Dehydration  AKI (acute kidney injury) [facility])    Patient is a 60 year old female with a history of diabetes, hypertension, marijuana abuse presenting today with a complaint of generalized weakness starting this weekend but when she checked her blood pressure and blood sugar today she had intermittent hyperglycemia and hypoglycemia and persistent hypotension worse with standing and walking. She states she has poor oral intake because of having an appetite and is concerned she is dehydrated. She is had no recent medication changes and states she did not accidentally take more medication and she supposed to. No recent infectious symptoms. No abdominal pain or diarrhea. No chest pain at this time and she was having some intermittent shortness of breath earlier but it is now resolved and that is a chronic problem.    With no abdominal pain low suspicion for ruptured AAA, pyelonephritis, perforated viscus, appendicitis or diverticulitis. Exam is normal except for dry mucous membranes. Blood pressure here is 115/69 with a blood sugar in the 100s. Will hydrate the patient. Will ensure no silent ischemia and appropriate renal function. Also getting cramping in her legs concerning for possible hypokalemia versus due to shift in blood sugars.  CBC, CMP, UA, troponin, chest x-ray, EKG pending  11:18 PM CBC without acute findings, UA with evidence of yeast but no other signs of infection. CMP today with evidence of acute renal failure with creatinine increased from from 0.8-2.84 and BUN of 41. EKG and troponin are  within normal limits. Will continue IV fluids but given  new acute renal failure will admit for further care. She denies any NSAID use but is on an ACE inhibitor. I personally performed the services described in this documentation, which was scribed in my presence.  The recorded information has been reviewed and considered.    Blanchie Dessert, MD 10/30/15 [place], MD 10/30/15 2328

## 2015-10-30 NOTE — ED Notes (Signed)
Pt c/o increased BS and low BP x 2 days

## 2015-10-30 NOTE — ED Notes (Signed)
States bs was high at 4 pm 442 Took 40 units of lantus and bs is coming down  Also states bp was low,  Has eaten and bp is back to normal but states she has some dizziness

## 2015-10-31 ENCOUNTER — Inpatient Hospital Stay (HOSPITAL_COMMUNITY): Payer: Medicare Other

## 2015-10-31 DIAGNOSIS — N179 Acute kidney failure, unspecified: Principal | ICD-10-CM

## 2015-10-31 LAB — CREATININE, URINE, RANDOM: Creatinine, Urine: 179.53 mg/dL

## 2015-10-31 LAB — RENAL FUNCTION PANEL
ANION GAP: 13 (ref 5–15)
Albumin: 3 g/dL — ABNORMAL LOW (ref 3.5–5.0)
BUN: 33 mg/dL — ABNORMAL HIGH (ref 6–20)
CHLORIDE: 105 mmol/L (ref 101–111)
CO2: 20 mmol/L — ABNORMAL LOW (ref 22–32)
Calcium: 8.9 mg/dL (ref 8.9–10.3)
Creatinine, Ser: 1.81 mg/dL — ABNORMAL HIGH (ref 0.44–1.00)
GFR, EST AFRICAN AMERICAN: 34 mL/min — AB (ref >60)
GFR, EST NON AFRICAN AMERICAN: 29 mL/min — AB (ref >60)
Glucose, Bld: 218 mg/dL — ABNORMAL HIGH (ref 65–99)
PHOSPHORUS: 3.9 mg/dL (ref 2.5–4.6)
POTASSIUM: 3.5 mmol/L (ref 3.5–5.1)
Sodium: 138 mmol/L (ref 135–145)

## 2015-10-31 LAB — IRON AND TIBC
Iron: 47 ug/dL (ref 28–170)
SATURATION RATIOS: 19 % (ref 10.4–31.8)
TIBC: 248 ug/dL — AB (ref 250–450)
UIBC: 201 ug/dL

## 2015-10-31 LAB — CBC
HEMATOCRIT: 28.7 % — AB (ref 36.0–46.0)
HEMOGLOBIN: 9.8 g/dL — AB (ref 12.0–15.0)
MCH: 30.8 pg (ref 26.0–34.0)
MCHC: 34.1 g/dL (ref 30.0–36.0)
MCV: 90.3 fL (ref 78.0–100.0)
Platelets: 239 10*3/uL (ref 150–400)
RBC: 3.18 MIL/uL — AB (ref 3.87–5.11)
RDW: 13.6 % (ref 11.5–15.5)
WBC: 8.5 10*3/uL (ref 4.0–10.5)

## 2015-10-31 LAB — FERRITIN: FERRITIN: 125 ng/mL (ref 11–307)

## 2015-10-31 LAB — GLUCOSE, CAPILLARY
GLUCOSE-CAPILLARY: 36 mg/dL — AB (ref 65–99)
GLUCOSE-CAPILLARY: 148 mg/dL — AB (ref 65–99)
GLUCOSE-CAPILLARY: 175 mg/dL — AB (ref 65–99)
GLUCOSE-CAPILLARY: 101 mg/dL — AB (ref 65–99)
GLUCOSE-CAPILLARY: 276 mg/dL — AB (ref 65–99)
Glucose-Capillary: 73 mg/dL (ref 65–99)
Glucose-Capillary: 294 mg/dL — ABNORMAL HIGH (ref 65–99)

## 2015-10-31 LAB — VITAMIN B12: Vitamin B-12: 465 pg/mL (ref 180–914)

## 2015-10-31 LAB — SODIUM, URINE, RANDOM: Sodium, Ur: 109 mmol/L

## 2015-10-31 LAB — TSH: TSH: 0.558 u[IU]/mL (ref 0.350–4.500)

## 2015-10-31 MED ORDER — ENOXAPARIN SODIUM 30 MG/0.3ML ~~LOC~~ SOLN
30.0000 mg | SUBCUTANEOUS | Status: DC
  Administered 2015-11-01: 30 mg via SUBCUTANEOUS
  Filled 2015-10-31: qty 0.3

## 2015-10-31 MED ORDER — INSULIN ASPART 100 UNIT/ML ~~LOC~~ SOLN
0.0000 [IU] | Freq: Every day | SUBCUTANEOUS | Status: DC
  Administered 2015-10-31: 3 [IU] via SUBCUTANEOUS

## 2015-10-31 MED ORDER — GLUCERNA SHAKE PO LIQD
237.0000 mL | Freq: Two times a day (BID) | ORAL | Status: DC
Start: 2015-10-31 — End: 2015-11-01
  Administered 2015-10-31 – 2015-11-01 (×2): 237 mL via ORAL

## 2015-10-31 MED ORDER — MIRTAZAPINE 15 MG PO TABS
15.0000 mg | ORAL_TABLET | Freq: Every day | ORAL | Status: DC
  Administered 2015-10-31: 15 mg via ORAL
  Filled 2015-10-31: qty 1

## 2015-10-31 MED ORDER — QUETIAPINE FUMARATE 50 MG PO TABS
200.0000 mg | ORAL_TABLET | Freq: Every day | ORAL | Status: DC
  Administered 2015-10-31: 200 mg via ORAL
  Filled 2015-10-31: qty 4

## 2015-10-31 MED ORDER — METOPROLOL TARTRATE 25 MG PO TABS
25.0000 mg | ORAL_TABLET | Freq: Two times a day (BID) | ORAL | Status: DC
  Filled 2015-10-31: qty 1

## 2015-10-31 MED ORDER — ENOXAPARIN SODIUM 40 MG/0.4ML ~~LOC~~ SOLN: 40.0000 mg | SUBCUTANEOUS | Status: DC

## 2015-10-31 MED ORDER — PANTOPRAZOLE SODIUM 20 MG PO TBEC
20.0000 mg | DELAYED_RELEASE_TABLET | Freq: Every day | ORAL | Status: DC
  Administered 2015-10-31 – 2015-11-01 (×2): 20 mg via ORAL
  Filled 2015-10-31 (×2): qty 1

## 2015-10-31 MED ORDER — GABAPENTIN 100 MG PO CAPS
100.0000 mg | ORAL_CAPSULE | Freq: Four times a day (QID) | ORAL | Status: DC
  Administered 2015-10-31 – 2015-11-01 (×6): 100 mg via ORAL
  Filled 2015-10-31 (×6): qty 1

## 2015-10-31 MED ORDER — INSULIN ASPART 100 UNIT/ML ~~LOC~~ SOLN
0.0000 [IU] | Freq: Three times a day (TID) | SUBCUTANEOUS | Status: DC
Start: 2015-10-31 — End: 2015-11-01
  Administered 2015-10-31: 2 [IU] via SUBCUTANEOUS
  Administered 2015-10-31 – 2015-11-01 (×2): 5 [IU] via SUBCUTANEOUS
  Administered 2015-11-01: 3 [IU] via SUBCUTANEOUS

## 2015-10-31 MED ORDER — METOPROLOL TARTRATE 25 MG PO TABS
25.0000 mg | ORAL_TABLET | Freq: Two times a day (BID) | ORAL | Status: DC
  Administered 2015-10-31 – 2015-11-01 (×2): 25 mg via ORAL
  Filled 2015-10-31 (×2): qty 1

## 2015-10-31 MED ORDER — NICOTINE 14 MG/24HR TD PT24
14.0000 mg | MEDICATED_PATCH | Freq: Every day | TRANSDERMAL | Status: DC
  Administered 2015-10-31 – 2015-11-01 (×2): 14 mg via TRANSDERMAL
  Filled 2015-10-31 (×2): qty 1

## 2015-10-31 MED ORDER — SODIUM CHLORIDE 0.9 % IV SOLN
INTRAVENOUS | Status: AC
  Administered 2015-10-31: 125 mL via INTRAVENOUS

## 2015-10-31 MED ORDER — ATORVASTATIN CALCIUM 20 MG PO TABS
20.0000 mg | ORAL_TABLET | Freq: Every day | ORAL | Status: DC
  Administered 2015-10-31: 20 mg via ORAL
  Filled 2015-10-31: qty 1

## 2015-10-31 NOTE — Progress Notes (Signed)
Initial Nutrition Assessment  DOCUMENTATION CODES:   Not applicable  INTERVENTION:   -Glucerna Shake po BID, each supplement provides 220 kcal and 10 grams of protein -RD provided "Protein Sources of Food" handout from [facility] Nutrition Care manual  NUTRITION DIAGNOSIS:   Inadequate oral intake related to poor appetite as evidenced by per patient/family report.  GOAL:   Patient will meet greater than or equal to 90% of their needs  MONITOR:   PO intake, Supplement acceptance, Labs, Weight trends, Skin, I & O's  REASON FOR ASSESSMENT:   Malnutrition Screening Tool    ASSESSMENT:   [name] is a 60 y.o. female presenting with generalized malaise and feeling poorly . PMH is significant for T2DM, HTN, HLD, Mood disorder/ anorexia, peripheral neuropathy, gastroparesis,   Pt admitted with AKI.  Hx obtained from pt at bedside. Pt reports a general decline in health over the past month, where she had experienced both poor appetite and weakness. Pt reports that she has consumed very little solid foods over the past month and diet has consisted mostly of liquids (particularly juices). She reveals that her PCP recommended Glucerna supplements, but was unable to use at home secondary to cost. Since admission, pt reports appetite has improved slightly. She consumed oatmeal with jelly, scrambled eggs with cream cheese, fruit, coffee, and about 75% of her milk.   Pt reports UBW of 145#, but suspects weight loss may be masked secondary to fluid retention. Nutrition-Focused physical exam completed. Findings are no fat depletion, mild muscle depletion, and mild edema. Muscle depletion noted in mild extremities only and is likely due to decreased mobility. Pt reports she is very active at baseline, but has been very sedentary over the past month due to weakness and feelings of loss of balance.   Discussed importance of good meal and supplement intake, especially protein-rich foods. Discussed  ways pt could increase protein intake in her diet and provided examples of low-cost protein rich foods that pt could utilize in place of oral nutrition supplements. Also recommended small, frequent meals to optimize intake. Pt was very grateful for visit and request printed information on protein sources, which this RD provided.   Labs reviewed: CBGS: 148-175.  Diet Order:  Diet Heart Room service appropriate?: Yes; Fluid consistency:: Thin  Skin:  Reviewed, no issues  Last BM:  PTA  Height:   Ht Readings from Last 1 Encounters:  10/31/15 5\' 2"  (1.575 m)    Weight:   Wt Readings from Last 1 Encounters:  10/31/15 143 lb 1.3 oz (64.9 kg)    Ideal Body Weight:  50 kg  BMI:  Body mass index is 26.16 kg/(m^2).  Estimated Nutritional Needs:   Kcal:  1600-1800  Protein:  75-90 grams  Fluid:  1.6-1.8 L  EDUCATION NEEDS:   Education needs addressed  Mahin Guardia A. Jimmye Norman, RD, LDN, CDE Pager: [phone] After hours Pager: [phone]

## 2015-10-31 NOTE — Progress Notes (Signed)
New Admission Note:   Arrival Method: via stretcher from [employer] Mental Orientation: Alert and Oriented x4 Telemetry: N/A Assessment: Completed Skin: Warm, dry, intact. Bruising on bilateral knees due to prior fall IV: Right AC Peripheral IV Pain: Denies Tubes: N/A Safety Measures: Educated on fall prevention safety plan, patient acknowledged and understood. Admission: Completed [address] Orientation: Patient has been orientated to the room, unit and staff.  Family: Updated by patient via telephone  Orders have been reviewed and implemented. Will continue to monitor the patient. Call light has been placed within reach and bed alarm has been activated.    Dorothea Glassman, RN  Phone number: [phone]

## 2015-10-31 NOTE — Progress Notes (Signed)
FPTS Interim Progress Note  S: Patient sitting up in bed eating breakfast. Discussed how patient has been feeling very fatigued this last week with decreased appetite. Denies any bloody BM or hematuria. No new lifestyle/diet changes.   O: BP 109/52 mmHg  Pulse 75  Temp(Src) 97.5 F (36.4 C) (Oral)  Resp 18  Ht 5\' 2"  (1.575 m)  Wt 143 lb 1.3 oz (64.9 kg)  BMI 26.16 kg/m2  SpO2 100%    A/P: [name] is a 60 y.o. female presenting with generalized malaise, feeling poorly, and AKI. PMH is significant for T2DM, HTN, HLD, Mood disorder/ anorexia, peripheral neuropathy, gastroparesis.  - Will add TSH, FOBT,  Iron panel, B12, and folate lab work to search for possible causes of fatigue/decreased appetite - FeNa calculated at 0.8% indicating pre-renal AKI. Creatinine trending down. Will continue to give fluid and trend Cr.  - BP low this AM; hold Lopressor if systolic BP [id number] and/or diastolic BP [id number] - Will continue to monitor  Carlyle Dolly, MD 10/31/2015, 11:27 AM PGY-1, [place] [facility] Service pager [phone]

## 2015-10-31 NOTE — Progress Notes (Signed)
Patient is a high fall risk. Patient refused to have the bed alarm on, stating that "they took too long earlier to come assist me to the bathroom". RN educated patient about the importance of having the bed alarm on for her safety. Yellow socks are on and yellow wrist arm band is on. RN will continue to monitor patient.  Ermalinda Memos, RN

## 2015-10-31 NOTE — Progress Notes (Signed)
Hypoglycemic Event  CBG: 36  Treatment: 15 GM carbohydrate snack  Symptoms: Hungry  Follow-up CBG: Time:0219 CBG Result:73  Possible Reasons for Event: Inadequate meal intake  Comments/MD notified: Notified on call Family Medicine.     Yolanda Bonine

## 2015-11-01 LAB — CBC
HCT: 30.1 % — ABNORMAL LOW (ref 36.0–46.0)
HEMOGLOBIN: 9.9 g/dL — AB (ref 12.0–15.0)
MCH: 29.6 pg (ref 26.0–34.0)
MCHC: 32.9 g/dL (ref 30.0–36.0)
MCV: 90.1 fL (ref 78.0–100.0)
Platelets: 253 10*3/uL (ref 150–400)
RBC: 3.34 MIL/uL — AB (ref 3.87–5.11)
RDW: 13.7 % (ref 11.5–15.5)
WBC: 8.2 10*3/uL (ref 4.0–10.5)

## 2015-11-01 LAB — BASIC METABOLIC PANEL
ANION GAP: 9 (ref 5–15)
BUN: 23 mg/dL — AB (ref 6–20)
CHLORIDE: 104 mmol/L (ref 101–111)
CO2: 27 mmol/L (ref 22–32)
Calcium: 9.4 mg/dL (ref 8.9–10.3)
Creatinine, Ser: 1.28 mg/dL — ABNORMAL HIGH (ref 0.44–1.00)
GFR calc Af Amer: 52 mL/min — ABNORMAL LOW (ref >60)
GFR calc non Af Amer: 45 mL/min — ABNORMAL LOW (ref >60)
GLUCOSE: 264 mg/dL — AB (ref 65–99)
POTASSIUM: 5 mmol/L (ref 3.5–5.1)
Sodium: 140 mmol/L (ref 135–145)

## 2015-11-01 LAB — ANTISTREPTOLYSIN O TITER: ASO: 39 IU/mL (ref 0.0–200.0)

## 2015-11-01 LAB — ANTINUCLEAR ANTIBODIES, IFA: ANTINUCLEAR ANTIBODIES, IFA: NEGATIVE

## 2015-11-01 LAB — FOLATE RBC
Folate, Hemolysate: 299.7 ng/mL
Folate, RBC: 989 ng/mL (ref >498)
Hematocrit: 30.3 % — ABNORMAL LOW (ref 34.0–46.6)

## 2015-11-01 LAB — GLUCOSE, CAPILLARY
GLUCOSE-CAPILLARY: 278 mg/dL — AB (ref 65–99)
Glucose-Capillary: 246 mg/dL — ABNORMAL HIGH (ref 65–99)

## 2015-11-01 LAB — MICROALBUMIN, URINE: Microalb, Ur: 4.5 ug/mL — ABNORMAL HIGH

## 2015-11-01 MED ORDER — INSULIN GLARGINE 100 UNIT/ML ~~LOC~~ SOLN: SUBCUTANEOUS | Status: DC

## 2015-11-01 NOTE — Discharge Instructions (Signed)
Your admitted for dehydration and kidney injury. You improved with fluids and were considered ready to go home. You will need to follow up with [facility].  We have decreased your Lantus to 20 units from 40 units due to your hypoglycemia on admission. Please follow up with your primary care physician regarding this issues. For now we want you to stop HCTZ-lisinopril as your kidney function needs to improve, you may restart this per discussion with your physician at the hospital follow up.

## 2015-11-01 NOTE — Progress Notes (Signed)
Inpatient Diabetes Program Recommendations  AACE/ADA: New Consensus Statement on Inpatient Glycemic Control (2015)  Target Ranges:  Prepandial:   less than 140 mg/dL      Peak postprandial:   less than 180 mg/dL (1-2 hours)      Critically ill patients:  140 - 180 mg/dL   Results for [name], [name] (MRN [id number]) as of 11/01/2015 10:33  Ref. Range 10/31/2015 07:41 10/31/2015 11:29 10/31/2015 16:57 10/31/2015 21:17 11/01/2015 07:50  Glucose-Capillary Latest Ref Range: 65-99 mg/dL 175 (H) 101 (H) 294 (H) 276 (H) 278 (H)   Review of Glycemic Control  Diabetes history: DM 2 Outpatient Diabetes medications: Lantus 40 units Current orders for Inpatient glycemic control: Novolog Sensitive + HS scale  Inpatient Diabetes Program Recommendations:   Glucose in the 200's. Patient takes basal insulin at home. Please consider restarting almost half of patient's home dose Lantus 15 units Q24hrs.   Thanks,  Tama Headings RN, MSN, [facility] Inpatient Diabetes Coordinator Team Pager [phone] (8a-5p)

## 2015-11-01 NOTE — Discharge Summary (Signed)
[name] Discharge Summary  Patient name: [name] Medical record number: [id number] Date of birth: [birth year] Age: 60 y.o. Gender: female Date of Admission: 10/30/2015  Date of Discharge: 11/01/2015 Admitting Physician: Lind Covert, MD  Primary Care Provider: [name], DO Consultants: None   Indication for Hospitalization:  Malaise and Dehydration   Discharge Diagnoses/Problem List:  Patient Active Problem List   Diagnosis Date Noted  . AKI (acute kidney injury) ([place]) 10/30/2015  . Rib pain on left side 10/04/2015  . Poor appetite 10/04/2015  . Personal history of colonic polyps 09/13/2015  . Cough 06/13/2015  . Tardive dyskinesia 06/19/2014  . Cataracts, bilateral 03/21/2014  . Constipation 02/13/2014  . Chronic diastolic CHF (congestive heart failure) ([place]) 07/26/2013  . Low back pain 02/16/2013  . Dysphagia, unspecified(787.20) 09/08/2012  . Hypertension 08/12/2012  . Abnormal colonoscopy 07/17/2011  . Other bipolar disorder ([place]) 08/23/2009  . FIBROMYALGIA 08/06/2009  . Gastroparesis 03/22/2009  . Peripheral autonomic neuropathy due to DM ([place]) 01/29/2007  . DM (diabetes mellitus), type 2, uncontrolled ([place]) 08/18/2006  . TOBACCO USE 08/18/2006  . Hyperlipidemia due to type 2 diabetes mellitus ([place]) 08/13/2006  . Depression, major, recurrent ([place]) 08/13/2006    Disposition: Home   Discharge Condition: Stable   Discharge Exam:  General: Patient doing well, NAD  Cardiovascular: RRR, no murmurs, rubs, or gallops  Respiratory: CTAB, no wheezes or rhonchi  Abdomen: BS+, no ttp, no distention  Extremities: No lower extremity edema   Brief Williams Course:  [name] is 60 y.o. presenting with hx of malaise, decreased PO intake. Patient was noted to have an AKI on admission with creatinine of 2.84 (baseline 0.6-0.8). Work up of creatinine included a FeNa which was 0.8% indicating a pre-renal AKI.   Renal u/s was performed and indicated no hydronephrosis. Patient received IV fluids a serum creatinine trended down with SCr at 1.28 at discharge.   Patient was hypoglycemic to 36 on admission, after taking Lantus at home, and therefore Lantus was reduced from 40 units to 20 units ( patient received 18 units of sliding scale over 24 hours with CBGs in 200s). Patient is currently on Remeron, and has issues with appetite. She sometimes choose not to eat, and at states she then does not take her Lantus when she does not eat. Per patient, she does not use sliding scale or meal time insulin as she has trouble with dosing, and has had a hypoglycemic coma from this use in the past.   Patient was noted to be anemic, and iron panel was performed and patient was not found to have iron deficiency. However after chart review, patient was found to have a colonoscopy positive for multiple polyps, with at least 1 polyp that had not been resected. Therefore, likely this anemia maybe due to an occult bleed.  Issues for Follow Up:  1. Patient with significantly decreased appetite, consider gastric emptying study to determine if a patient truly has diagnosis of gastroparesis  2. Patient admitted with hypoglycemia to the 30s, therefore reduced Lantus 20 units from 40 units    3. Holding HCTZ-Lisinopril for now as patient recovering from AKI, would recheck BMET at office visit to ensure improving creatinine   Significant Procedures: Noe  Significant Labs and Imaging:   Recent Labs Lab 10/30/15 2220 10/31/15 0539 11/01/15 0547  WBC 10.5 8.5 8.2  HGB 11.4* 9.8* 9.9*  HCT 33.8* 28.7* 30.1*  PLT 302 239 253    Recent  Labs Lab 10/30/15 2220 10/31/15 0539 11/01/15 0547  NA 139 138 140  K 4.4 3.5 5.0  CL 102 105 104  CO2 27 20* 27  GLUCOSE 166* 218* 264*  BUN 41* 33* 23*  CREATININE 2.84* 1.81* 1.28*  CALCIUM 10.1 8.9 9.4  PHOS  --  3.9  --   ALKPHOS 112  --   --   AST 18  --   --   ALT 11*  --   --    ALBUMIN 4.1 3.0*  --      Results/Tests Pending at Time of Discharge: None  Discharge Medications:    Medication List    STOP taking these medications        lisinopril-hydrochlorothiazide 20-12.5 MG tablet  Commonly known as:  PRINZIDE,ZESTORETIC      TAKE these medications        aspirin 81 MG tablet  Take 1 tablet (81 mg total) by mouth daily.     atorvastatin 40 MG tablet  Commonly known as:  LIPITOR  Take 1 tablet (40 mg total) by mouth daily.     PEDIASURE Liqd  Take 237 mLs by mouth daily.     feeding supplement (GLUCERNA SHAKE) Liqd  Drink one bottle 237 mL daily between meals.     gabapentin 100 MG capsule  Commonly known as:  NEURONTIN  Take 1 capsule (100 mg total) by mouth 4 (four) times daily.     glucose blood test strip  Commonly known as:  ONE TOUCH ULTRA TEST  Test 3 times a day.     insulin glargine 100 UNIT/ML injection  Commonly known as:  LANTUS  INJECT 20 UNIITS SUBCUTANEOUSLY ONCE DAILY     metoprolol succinate 50 MG 24 hr tablet  Commonly known as:  TOPROL-XL  Take 1 tablet by mouth  daily with or immediately  following a meal     mirtazapine 15 MG tablet  Commonly known as:  REMERON  Take 1 tablet (15 mg total) by mouth at bedtime.     ONE TOUCH LANCETS Misc  1 Device by Does not apply route 3 (three) times daily.     onetouch ultrasoft lancets  Use as instructed     ONE TOUCH ULTRA 2 w/Device Kit  1 kit by Does not apply route once.     SEROQUEL XR 200 MG 24 hr tablet  Generic drug:  QUEtiapine  Take 1 tablet (200 mg total) by mouth at bedtime.     tiZANidine 2 MG tablet  Commonly known as:  ZANAFLEX  Take 1 tablet (2 mg total) by mouth every 8 (eight) hours as needed for muscle spasms.        Discharge Instructions: Please refer to Patient Instructions section of EMR for full details.  Patient was counseled important signs and symptoms that should prompt return to medical care, changes in medications, dietary  instructions, activity restrictions, and follow up appointments.   Follow-Up Appointments:     Follow-up Information    Follow up with Chrisandra Netters, MD. Go on 11/06/2015.   Specialty:  Family Medicine   Why:  Williams follow-up @ 9:45    Contact information:   [place] [address] [phone]       Tonette Bihari, MD 11/01/2015, 2:45 PM PGY-1, [place]

## 2015-11-01 NOTE — Progress Notes (Signed)
Discharge instructions and medications discussed with patient.  All questions answered.  

## 2015-11-01 NOTE — Progress Notes (Signed)
Family Medicine Teaching Service Daily Progress Note Intern Pager: [phone]  Patient name: [name] Medical record number: [id number] Date of birth: 1956/03/05 Age: 60 y.o. Gender: female  Primary Care Provider: [name], DO Consultants: None  Code Status: Full   Pt Overview and Major Events to Date:   Assessment and Plan:  [name] is a 60 y.o. female presenting with generalized malaise and feeling poorly . PMH is significant for T2DM, HTN, HLD, Mood disorder/ anorexia, peripheral neuropathy, gastroparesis,   # Acute kidney injury: Cr at [facility] 2.84 (baseline 0.6-0.8). Electrolytes stable. BG 166. UA with 30 protein, >1000 glucose, small bili, 15 ketones, no blood. VSS. S/p 2L bolus in ED. Dehydration secondary to decreased PO vs post infectious (denies recent illness) vs secondary to chronic disease (uncontrolled DM/HTN). 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 FeNa calculated at 0.8% indicating pre-renal AKI. No hydronephrosis  on kidney u/s - Scr trending down  2.8> 1.81>1.28  - VS per floor protocol - Hgb 9.8> 9.9 - Hold ACE/Hctz - Strict I/Os , 2.2L  - MIVFs  # T2DM/ neuropathy: Last A1c 8.8 (09/2015). BG in ED 166. On Lantus 40u qd. lantus for now in the setting of hypoglycemia to 30s upon arrival - Will reduce Lantus to 20 units at discharge  - sSSI for now - Continue Neurontin 100mg  qid - monitor BG - HH/ carb mod diet  Anemia: Iron panel. Colonoscopy Eight 2 to 10 mm polyps in the sigmoid colon, in the descending colon, in the transverse colon, in the ascending colon and in the cecum, removed with a cold snare. Polyp resection was incomplete, and the resected tissue was partially retrieved. - Anemia for the last 3 years. Only parietal polyp resection, possibly occult bleeding still   # HTN/ Chronic Diastolic CHF: BP,Controlled Last echo 06/2013 w/ EF 60-65%,  severe LVH. Was supposed to be followed by Dr Radford Pax outpatient but looks to have been lost to f/u. On Lisinopril/ HCTZ, Toprol XL 50 outpatient - Monitor BP - Hold ACE/HCTZ - Will give Lopressor 25mg  bid while hospitalized   # HLD: on lipitor 40 at home - continue home lipitor at reduced dose 20 mg  # Mood disorder/ poor appetite: on Remeron 15mg , Seroquel 200mg  at home - Continue home meds  # Tobacco use d/o: smokes 1/2 ppd - Nicotine 14 patch - continue smoking cessation counseling  FEN/GI: HH Carb mod diet Prophylaxis: Lovenox renally dosed  Disposition: Admit to med surg  Subjective:  Patient doing well this morning. Ready to go.   Objective: Temp:  [97.5 F (36.4 C)-98.2 F (36.8 C)] 97.5 F (36.4 C) (05/18 0750) Pulse Rate:  [75-105] 86 (05/18 0750) Resp:  [16-18] 16 (05/18 0750) BP: (109-160)/(52-86) 129/70 mmHg (05/18 0750) SpO2:  [95 %-100 %] 98 % (05/18 0750) Physical Exam: General: Patient doing well, NAD  Cardiovascular: RRR, no murmurs, rubs, or gallops  Respiratory: CTAB, no wheezes or rhonchi  Abdomen: BS+, no ttp, no distention  Extremities: No lower extremity edema   Laboratory:  Recent Labs Lab 10/30/15 2220 10/31/15 0539 11/01/15 0547  WBC 10.5 8.5 8.2  HGB 11.4* 9.8* 9.9*  HCT 33.8* 28.7* 30.1*  PLT 302 239 253    Recent Labs Lab 10/30/15 2220 10/31/15 0539 11/01/15 0547  NA 139 138 140  K 4.4 3.5 5.0  CL 102 105 104  CO2 27 20* 27  BUN 41* 33* 23*  CREATININE 2.84* 1.81* 1.28*  CALCIUM 10.1 8.9 9.4  PROT 8.2*  --   --   BILITOT 0.5  --   --   ALKPHOS 112  --   --   ALT 11*  --   --   AST 18  --   --   GLUCOSE 166* 218* 264*    Imaging/Diagnostic Tests: Dg Chest 2 View  10/31/2015  ADDENDUM REPORT: 10/31/2015 18:09 ADDENDUM: Please note this is a 60 year old female patient. Electronically Signed   By: Anner Crete M.D.   On: 10/31/2015 18:09  10/31/2015  CLINICAL DATA:  34-year-old female with weakness and near  syncope. EXAM: CHEST  2 VIEW COMPARISON:  Chest radiograph dated 08/11/2014 FINDINGS: There is centrilobular emphysema. No focal consolidation, pleural effusion, or pneumothorax. The cardiac silhouette is within normal limits. No acute osseous pathology. IMPRESSION: No active cardiopulmonary disease. Electronically Signed: By: Anner Crete M.D. On: 10/30/2015 23:51   US Renal  10/31/2015  CLINICAL DATA:  Acute kidney injury. Elevated BUN and creatinine. History of diabetes and hypertension. EXAM: RENAL / URINARY TRACT ULTRASOUND COMPLETE COMPARISON:  Ultrasound abdomen 08/11/2014 FINDINGS: Right Kidney: Length: 11.5 cm. Echogenicity within normal limits. No mass or hydronephrosis visualized. Left Kidney: Length: 11.6 cm. Echogenicity within normal limits. No mass or hydronephrosis visualized. Bladder: Appears normal for degree of bladder distention. No wall thickening or focal filling defects. IMPRESSION: Normal examination.  No hydronephrosis in the kidneys. Electronically Signed   By: Lucienne Capers M.D.   On: 10/31/2015 05:03     Asiyah Cletis Media, MD 11/01/2015, 7:52 AM PGY-1, [place] Intern pager: [phone], text pages welcome

## 2015-11-06 ENCOUNTER — Inpatient Hospital Stay: Payer: Medicare Other | Admitting: Family Medicine

## 2015-11-09 ENCOUNTER — Inpatient Hospital Stay: Payer: Medicare Other | Admitting: Family Medicine

## 2015-12-20 ENCOUNTER — Ambulatory Visit (INDEPENDENT_AMBULATORY_CARE_PROVIDER_SITE_OTHER): Payer: Medicare Other | Admitting: Family Medicine

## 2015-12-20 VITALS — BP 185/76 | HR 83 | Temp 97.6°F | Ht 62.0 in | Wt 148.0 lb

## 2015-12-20 DIAGNOSIS — E1143 Type 2 diabetes mellitus with diabetic autonomic (poly)neuropathy: Secondary | ICD-10-CM | POA: Diagnosis not present

## 2015-12-20 DIAGNOSIS — K3184 Gastroparesis: Secondary | ICD-10-CM

## 2015-12-20 DIAGNOSIS — I10 Essential (primary) hypertension: Secondary | ICD-10-CM

## 2015-12-20 DIAGNOSIS — E1100 Type 2 diabetes mellitus with hyperosmolarity without nonketotic hyperglycemic-hyperosmolar coma (NKHHC): Secondary | ICD-10-CM

## 2015-12-20 LAB — POCT GLYCOSYLATED HEMOGLOBIN (HGB A1C): HEMOGLOBIN A1C: 10.4

## 2015-12-20 MED ORDER — ZOSTER VACCINE LIVE 19400 UNT/0.65ML ~~LOC~~ SUSR: 0.6500 mL | Freq: Once | SUBCUTANEOUS | Status: DC

## 2015-12-20 NOTE — Progress Notes (Signed)
Date of Visit: 12/20/2015   HPI:  Patient presents for routine follow up of medical issues. She was hospitalized from 5/16-5/18 of this year with malaise & dehydration, had AKI and hypoglycemia She was not able to attend her hospital follow up appointment. This is the first time we are seeing her since discharge.   Hypertension - taking metoprolol XL 50mg  daily. Lisinopril-HCTZ was held on discharge from the hospital until renal function could be rechecked. Has not been taking this medication in the interim. Denies chest pain, shortness of breath, swelling.  Diabetes - had hypoglycemia while hospitalized, and discharged on lantus 20 units daily. She has gone back up to 40 units daily. Takes it at 10am each day. Fasting sugars tend to be 140-300s. No low sugars at all. She does note gustatory sweating, every time she eats. Last eye exam was over 2 years ago, though has an eye doctor coming to her house on the 17th, this was offered to her through her insurance. Has diabetic peripheral neuropathy, well controlled with gabapentin.   While hospitalized there was question of possible gastroparesis and discharge summary recommends considering outpatient gastric emptying study. She takes an over the counter GERD medication (starts with p, unclear if prilosec or pantoprazole). Denies any significant nausea now. Eating and drinking well.   ROS: See HPI.  [place]: history of type 2 diabetes, peripheral neuropathy, hyperlipidemia, bipolar disorder, tobacco use, hypertension, chronic diastolic CHF, depression, constipation, gastroparesis, fibromyalgia  PHYSICAL EXAM: BP 185/76 mmHg  Pulse 83  Temp(Src) 97.6 F (36.4 C) (Oral)  Ht 5\' 2"  (1.575 m)  Wt 148 lb (67.132 kg)  BMI 27.06 kg/m2 Gen: NAD, pleasant, cooperative HEENT: normocephalic, atraumatic, moist mucous membranes  Heart: regular rate and rhythm, no murmur Lungs: clear to auscultation bilaterally, normal work of breathing  Neuro: alert, grossly  nonfocal, speech normal. Ext: No appreciable lower extremity edema bilaterally  Abdomen: soft, nontender to palpation, no masses or organomegaly  ASSESSMENT/PLAN:  Health maintenance:  -rx for zostavax today -instructed to schedule Annual Wellness Visit   DM (diabetes mellitus), type 2, uncontrolled ([place]) Uncontrolled with A1c of 10.4. Fasting CBG's uncontrolled. Will increase lantus to 45 units each morning starting 7/7, then have patient titrate up based on fasting sugars each day. Reviewed verbally and in writing how to titrate. Patient feels capable of doing this. Follow up with new PCP in 1 month to see how sugars are doing.  Cardiac: on statin, encouraged to get aspirin (has been out of this) Renal: plan to likely restart ACE pending recheck of BMET today Eye: has appointment, asked her to ensure they send [place] records of this Foot: utd, due December 2017 Immunizations: zostavax rx given today, otherwise utd    Peripheral autonomic neuropathy due to DM ([place]) Well controlled. Continue current regimen.   Hypertension Uncontrolled at present, though likely because she is not on her lisinopril-HCTZ. Recheck BMET today. If creatinine & electrolytes improved from hospitalization, will plan to call her & instruct her to restart. Patient agreeable to this plan.  Gastroparesis Seems this was a presumed diagnosis, no actual gastric emptying study done previously. Patient denies having symptoms at present. She will continue her over the counter GERD medication. Follow up if symptoms worsen/return.   FOLLOW UP: Follow up in 35mo with new PCP for diabetes &htn   [place] J. Ardelia Mems, [place]

## 2015-12-20 NOTE — Patient Instructions (Signed)
For insulin: -tomorrow morning take 45 units -check sugar each morning. If >110, go up by 1 unit that day. -continue until fasting sugar is around 110  I will call you tomorrow with what to do with your blood pressure medications. Want to see your kidney function first.  Follow up with Dr. Yisroel Ramming in 1 month to see how sugars are going, sooner if needed  See prescription for zostavax (shingles vaccine) - take to your pharmacy to get this  Schedule an annual wellness visit with Adonis Brook or Ander Purpura. This is a longer visit to focus on your goals and how to make you well. It is free.   Be well, Dr. Ardelia Mems

## 2015-12-21 ENCOUNTER — Telehealth: Payer: Self-pay | Admitting: Family Medicine

## 2015-12-21 DIAGNOSIS — I10 Essential (primary) hypertension: Secondary | ICD-10-CM

## 2015-12-21 LAB — BASIC METABOLIC PANEL WITH GFR
BUN: 15 mg/dL (ref 7–25)
CO2: 23 mmol/L (ref 20–31)
Calcium: 9.5 mg/dL (ref 8.6–10.4)
Chloride: 105 mmol/L (ref 98–110)
Creat: 0.81 mg/dL (ref 0.50–0.99)
GFR, Est Non African American: 79 mL/min (ref >=60)
Glucose, Bld: 132 mg/dL — ABNORMAL HIGH (ref 65–99)
POTASSIUM: 4.4 mmol/L (ref 3.5–5.3)
SODIUM: 143 mmol/L (ref 135–146)

## 2015-12-21 MED ORDER — LISINOPRIL-HYDROCHLOROTHIAZIDE 20-12.5 MG PO TABS: 1.0000 | ORAL_TABLET | Freq: Every day | ORAL | Status: DC

## 2015-12-21 NOTE — Assessment & Plan Note (Signed)
Well-controlled.  Continue current regimen. 

## 2015-12-21 NOTE — Assessment & Plan Note (Signed)
Uncontrolled with A1c of 10.4. Fasting CBG's uncontrolled. Will increase lantus to 45 units each morning starting 7/7, then have patient titrate up based on fasting sugars each day. Reviewed verbally and in writing how to titrate. Patient feels capable of doing this. Follow up with new PCP in 1 month to see how sugars are doing.  Cardiac: on statin, encouraged to get aspirin (has been out of this) Renal: plan to likely restart ACE pending recheck of BMET today Eye: has appointment, asked her to ensure they send [place] records of this Foot: utd, due December 2017 Immunizations: zostavax rx given today, otherwise utd

## 2015-12-21 NOTE — Assessment & Plan Note (Signed)
Seems this was a presumed diagnosis, no actual gastric emptying study done previously. Patient denies having symptoms at present. She will continue her over the counter GERD medication. Follow up if symptoms worsen/return.

## 2015-12-21 NOTE — Assessment & Plan Note (Signed)
Uncontrolled at present, though likely because she is not on her lisinopril-HCTZ. Recheck BMET today. If creatinine & electrolytes improved from hospitalization, will plan to call her & instruct her to restart. Patient agreeable to this plan.

## 2015-12-21 NOTE — Telephone Encounter (Signed)
Called patient to discuss labs bmet normal Can restart her lisinopril-HCTZ. She has this at home already, does not need rx. Follow up in 1 week for lab visit to recheck BMET & nurse visit to check blood pressure Patient agreeable to this plan & appreciative.  Also advised her that I read more about the gustatory sweating - can come with diabetes. Doubt there is anything to do about this at present.  Leeanne Rio, MD

## 2015-12-26 ENCOUNTER — Ambulatory Visit: Payer: Medicare Other | Admitting: Family Medicine

## 2016-01-02 LAB — HM DIABETES EYE EXAM

## 2016-01-17 ENCOUNTER — Ambulatory Visit: Payer: Medicare Other | Admitting: Family Medicine

## 2016-01-18 ENCOUNTER — Telehealth: Payer: Self-pay | Admitting: Family Medicine

## 2016-01-18 NOTE — Telephone Encounter (Signed)
[name] from [facility] was calling to let us know she is going to see if the pt wants to be accepted into the heart program and if so she will be given a scale and a tablet. They will let you know if she is having any fluid retention. ep

## 2016-01-31 ENCOUNTER — Ambulatory Visit: Payer: Medicare Other | Admitting: *Deleted

## 2016-02-03 NOTE — Progress Notes (Signed)
Subjective:   Patient ID: [name], female    DOB: 1955-11-07, 60 y.o.   MRN: [id number]  CC: Diabetes  HPI: HANNAHLEE KELLEN is a 60 y.o. female who presents to clinic today to talk about diabetes and to meet me as her new PCP for the first time. Problems discussed today are as follows:  1. Type 2 Diabetes Mellitus Complains of increased fatigue over past 2-3 months. Has a h/o hospitalization for low blood sugar (<20). Currently on Lantus 40 units daily AM. Does not know her sugar levels because she does not record them but says they fluctuate. - Chronic disease for years and uncontrolled - Says her Lantus was increased to 45 U but she decreased it to 40 U because of decreased eating, home meds showed 40 U prescribed - Increased fatigue likely secondary to uncontrolled blood sugars as she does not keep track of them  2. Hypertension Was hospitalized 10/30/15 for AKI Cr 2.84 (baseline 0.6-0.8). Her lisinopril-HCTZ was held. She restarted the medication on 12/21/15 with a BMP showing Cr 0.81. BP controlled at present. - Checking BMP for kidney function today - Patient BP 119/63 today - Patient decreased smoking from 1/2 ppd to 4 cigs/day but unwilling to decrease at present  3. Zoster Vaccination Previously prescribed but patient did not receive vaccination due to concerns with side effects after seeing commercials. - Discussed her concerns and explained why they have so many side effects listed - She was willing to get the vaccination after she gets paid in 02/17/16  ROS: See HPI.  [place]: Pertinent past medical, surgical, family, and social history were reviewed and updated as appropriate.  Smoking status: reviewed.  Objective:   BP 119/63 (BP Location: Left Arm, Patient Position: Sitting, Cuff Size: Normal)   Pulse (!) 104   Temp 98.4 F (36.9 C) (Oral)   Wt 146 lb (66.2 kg)   BMI 26.70 kg/m  Vitals and nursing note reviewed.  GEN: patient is cooperative and pleasant,  no acute distress HEENT: PERRLA, EOM with full ROM, no conjunctival injection, no lid lag, normal tympanic light reflex, no nasal polyps, no rhinorrhea, no pharyngeal erythema or exudates, neck has full ROM without cervical or supraclavicular adenopathy, no thyromegaly or nodules LUNG: clear to auscultation bilaterally, no wheezes/rhonchi/rales, no use of accessory muscles CV: RRR, no m/r/g, no carotid bruits bilaterally, no peripheral edema GI: soft, non-distended, non-tender, normoactive bowel sounds, no hepatosplenomegaly SKIN: warm and dry, no rashes or lesions PSYCH: alert & oriented x3, appropriate affect  Assessment & Plan:   DM (diabetes mellitus), type 2, uncontrolled ([place]) - Patient on Lantus 40 U - H/o hospitalizations for low blood sugars (<20) - Fatigue over past 2-3 months likely related to sugar levels - Patient encouraged to keep record of blood sugars after waking, 1 hour after lunch, and before bed, will keep log and bring to next visit - Patient will f/u with me in 1 month to go over sugar levels and further T2DM management - Patient reconsidered Zoster vaccine after our discussion, placed prescription, pt will get on 02/17/16 when she gets paid  Hypertension - BP well controlled today at [id number] - Complicated by [id number] - Lisinopril/HCTZ restarted 12/21/15 - BMP pending to recheck creatinine level today for kidney function, will f/u with results  Orders Placed This Encounter  Procedures  . BASIC METABOLIC PANEL WITH GFR   Meds ordered this encounter  Medications  . Zoster Vaccine Live, PF, (ZOSTAVAX) 96295 UNT/0.65ML injection  Sig: Inject 19,400 Units into the skin once.    Dispense:  1 each    Refill:  0    Harriet [place], [place], PGY-1 02/04/2016 5:12 PM

## 2016-02-04 ENCOUNTER — Ambulatory Visit (INDEPENDENT_AMBULATORY_CARE_PROVIDER_SITE_OTHER): Payer: Medicare Other | Admitting: Family Medicine

## 2016-02-04 ENCOUNTER — Encounter: Payer: Self-pay | Admitting: Family Medicine

## 2016-02-04 DIAGNOSIS — E1100 Type 2 diabetes mellitus with hyperosmolarity without nonketotic hyperglycemic-hyperosmolar coma (NKHHC): Secondary | ICD-10-CM | POA: Diagnosis not present

## 2016-02-04 DIAGNOSIS — I10 Essential (primary) hypertension: Secondary | ICD-10-CM | POA: Diagnosis not present

## 2016-02-04 MED ORDER — ZOSTER VACCINE LIVE 19400 UNT/0.65ML ~~LOC~~ SUSR: 0.6500 mL | Freq: Once | SUBCUTANEOUS | 0 refills | Status: AC

## 2016-02-04 NOTE — Assessment & Plan Note (Addendum)
-   Patient on Lantus 40 U - H/o hospitalizations for low blood sugars (<20) - Fatigue over past 2-3 months likely related to sugar levels - Patient encouraged to keep record of blood sugars after waking, 1 hour after lunch, and before bed, will keep log and bring to next visit - Patient will f/u with me in 1 month to go over sugar levels and further T2DM management - Patient reconsidered Zoster vaccine after our discussion, placed prescription, pt will get on 02/17/16 when she gets paid

## 2016-02-04 NOTE — Patient Instructions (Signed)
It was a pleasure to meet you today. Please see below to review our plan for today's visit.  1. It was a please to meet you. I will be your new PCP. 2. I want you to take your blood sugar every morning when you get up, 1 hour after lunch, and before bed. Please log them and bring it in to your next visit in 1 month 3. You increased fatigue may be related to you sugar levels. Please take you lantus as prescribed but decrease your dose in the day if you do not eat as this can cause unsafe low glucose levels. 4. I will follow up with your kidney function results   Please call the clinic at [phone] if your symptoms worsen or you have any concerns. Have a great day. -- Dr. Yisroel Ramming, [place]  Blood Glucose Monitoring, Adult Monitoring your blood glucose (also know as blood sugar) helps you to manage your diabetes. It also helps you and your health care provider monitor your diabetes and determine how well your treatment plan is working. WHY SHOULD YOU MONITOR YOUR BLOOD GLUCOSE?  It can help you understand how food, exercise, and medicine affect your blood glucose.  It allows you to know what your blood glucose is at any given moment. You can quickly tell if you are having low blood glucose (hypoglycemia) or high blood glucose (hyperglycemia).  It can help you and your health care provider know how to adjust your medicines.  It can help you understand how to manage an illness or adjust medicine for exercise. WHEN SHOULD YOU TEST? Your health care provider will help you decide how often you should check your blood glucose. This may depend on the type of diabetes you have, your diabetes control, or the types of medicines you are taking. Be sure to write down all of your blood glucose readings so that this information can be reviewed with your health care provider. See below for examples of testing times that your health care provider may suggest. Type 1  Diabetes  Test at least 2 times per day if your diabetes is well controlled, if you are using an insulin pump, or if you perform multiple daily injections.  If your diabetes is not well controlled or if you are sick, you may need to test more often.  It is a good idea to also test:  Before every insulin injection.  Before and after exercise.  Between meals and 2 hours after a meal.  Occasionally between 2:00 a.m. and 3:00 a.m. Type 2 Diabetes  If you are taking insulin, test at least 2 times per day. However, it is best to test before every insulin injection.  If you take medicines by mouth (orally), test 2 times a day.  If you are on a controlled diet, test once a day.  If your diabetes is not well controlled or if you are sick, you may need to monitor more often. HOW TO MONITOR YOUR BLOOD GLUCOSE Supplies Needed  Blood glucose meter.  Test strips for your meter. Each meter has its own strips. You must use the strips that go with your own meter.  A pricking needle (lancet).  A device that holds the lancet (lancing device).  A journal or log book to write down your results. Procedure  Wash your hands with soap and water. Alcohol is not preferred.  Prick the side of your finger (not the tip) with the lancet.  Gently milk  the finger until a small drop of blood appears.  Follow the instructions that come with your meter for inserting the test strip, applying blood to the strip, and using your blood glucose meter. Other Areas to Get Blood for Testing Some meters allow you to use other areas of your body (other than your finger) to test your blood. These areas are called alternative sites. The most common alternative sites are:  The forearm.  The thigh.  The back area of the lower leg.  The palm of the hand. The blood flow in these areas is slower. Therefore, the blood glucose values you get may be delayed, and the numbers are different from what you would get from  your fingers. Do not use alternative sites if you think you are having hypoglycemia. Your reading will not be accurate. Always use a finger if you are having hypoglycemia. Also, if you cannot feel your lows (hypoglycemia unawareness), always use your fingers for your blood glucose checks. ADDITIONAL TIPS FOR GLUCOSE MONITORING  Do not reuse lancets.  Always carry your supplies with you.  All blood glucose meters have a 24-hour "hotline" number to call if you have questions or need help.  Adjust (calibrate) your blood glucose meter with a control solution after finishing a few boxes of strips. BLOOD GLUCOSE RECORD KEEPING It is a good idea to keep a daily record or log of your blood glucose readings. Most glucose meters, if not all, keep your glucose records stored in the meter. Some meters come with the ability to download your records to your home computer. Keeping a record of your blood glucose readings is especially helpful if you are wanting to look for patterns. Make notes to go along with the blood glucose readings because you might forget what happened at that exact time. Keeping good records helps you and your health care provider to work together to achieve good diabetes management.    This information is not intended to replace advice given to you by your health care provider. Make sure you discuss any questions you have with your health care provider.   Document Released: 06/05/2003 Document Revised: 06/23/2014 Document Reviewed: 10/25/2012 Elsevier Interactive Patient Education [employer].

## 2016-02-04 NOTE — Assessment & Plan Note (Addendum)
-   BP well controlled today at [id number] - Complicated by [id number] - Lisinopril/HCTZ restarted 12/21/15 - BMP pending to recheck creatinine level today for kidney function, will f/u with results

## 2016-02-05 ENCOUNTER — Telehealth: Payer: Self-pay | Admitting: *Deleted

## 2016-02-05 ENCOUNTER — Telehealth: Payer: Self-pay | Admitting: Family Medicine

## 2016-02-05 LAB — BASIC METABOLIC PANEL WITH GFR
BUN: 21 mg/dL (ref 7–25)
CALCIUM: 9.4 mg/dL (ref 8.6–10.4)
CO2: 24 mmol/L (ref 20–31)
CREATININE: 1.29 mg/dL — AB (ref 0.50–0.99)
Chloride: 100 mmol/L (ref 98–110)
GFR, EST AFRICAN AMERICAN: 52 mL/min — AB (ref >=60)
GFR, Est Non African American: 45 mL/min — ABNORMAL LOW (ref >=60)
GLUCOSE: 459 mg/dL — AB (ref 65–99)
Potassium: 4.4 mmol/L (ref 3.5–5.3)
SODIUM: 133 mmol/L — AB (ref 135–146)

## 2016-02-05 NOTE — Telephone Encounter (Signed)
Received call from [facility] around 11:20 this morning concerning an alert high glucose value on ms [place]. Called and notified Dr Yisroel Ramming.[name], [name]

## 2016-02-05 NOTE — Telephone Encounter (Signed)
Patient was reached on home phone. Discussed CMET results from yesterday (8/22) which showed Cr 1.29 (baseline for her according to chart history, therefore we will continue the HCTZ/Lisinopril. She also was noted to have a blood glucose level of 459 yesterday. Patient states she took her 47 U Lantus yesterday before coming to the clinic though she is suppose to be taking early in the morning. She denied symptoms of decreased oral intake or symptoms of dehydration, SOB, CP, headache, n/v, diarrhea, or syncopal episodes. Patient stated she took her insulin this morning and feels better days. She was told when to seek emergent care including decreased oral intake leading to dehydration/ persistent n/v. Will schule f/u appointment in 1 week.  Harriet [place], [place], PGY-1

## 2016-02-28 ENCOUNTER — Ambulatory Visit: Payer: Medicare Other | Admitting: Family Medicine

## 2016-03-04 ENCOUNTER — Ambulatory Visit: Payer: Medicare Other | Admitting: *Deleted

## 2016-03-06 ENCOUNTER — Ambulatory Visit (INDEPENDENT_AMBULATORY_CARE_PROVIDER_SITE_OTHER): Payer: Medicare Other | Admitting: *Deleted

## 2016-03-06 ENCOUNTER — Encounter: Payer: Self-pay | Admitting: *Deleted

## 2016-03-06 VITALS — BP 158/66 | HR 88 | Temp 98.3°F | Ht 62.0 in | Wt 153.8 lb

## 2016-03-06 DIAGNOSIS — Z0000 Encounter for general adult medical examination without abnormal findings: Secondary | ICD-10-CM | POA: Diagnosis not present

## 2016-03-06 DIAGNOSIS — Z23 Encounter for immunization: Secondary | ICD-10-CM | POA: Diagnosis not present

## 2016-03-06 NOTE — Patient Instructions (Signed)
Fat and Cholesterol Restricted Diet Getting too much fat and cholesterol in your diet may cause health problems. Following this diet helps keep your fat and cholesterol at normal levels. This can keep you from getting sick. WHAT TYPES OF FAT SHOULD I CHOOSE?  Choose monosaturated and polyunsaturated fats. These are found in foods such as olive oil, canola oil, flaxseeds, walnuts, almonds, and seeds.  Eat more omega-3 fats. Good choices include salmon, mackerel, sardines, tuna, flaxseed oil, and ground flaxseeds.  Limit saturated fats. These are in animal products such as meats, butter, and cream. They can also be in plant products such as palm oil, palm kernel oil, and coconut oil.   Avoid foods with partially hydrogenated oils in them. These contain trans fats. Examples of foods that have trans fats are stick margarine, some tub margarines, cookies, crackers, and other baked goods. WHAT GENERAL GUIDELINES DO I NEED TO FOLLOW?   Check food labels. Look for the words "trans fat" and "saturated fat."  When preparing a meal:  Fill half of your plate with vegetables and green salads.  Fill one fourth of your plate with whole grains. Look for the word "whole" as the first word in the ingredient list.  Fill one fourth of your plate with lean protein foods.  Limit fruit to two servings a day. Choose fruit instead of juice.  Eat more foods with soluble fiber. Examples of foods with this type of fiber are apples, broccoli, carrots, beans, peas, and barley. Try to get 20-30 g (grams) of fiber per day.  Eat more home-cooked foods. Eat less at restaurants and buffets.  Limit or avoid alcohol.  Limit foods high in starch and sugar.  Limit fried foods.  Cook foods without frying them. Baking, boiling, grilling, and broiling are all great options.  Lose weight if you are overweight. Losing even a small amount of weight can help your overall health. It can also help prevent diseases such as  diabetes and heart disease. WHAT FOODS CAN I EAT? Grains Whole grains, such as whole wheat or whole grain breads, crackers, cereals, and pasta. Unsweetened oatmeal, bulgur, barley, quinoa, or brown rice. Corn or whole wheat flour tortillas. Vegetables Fresh or frozen vegetables (raw, steamed, roasted, or grilled). Green salads. Fruits All fresh, canned (in natural juice), or frozen fruits. Meat and Other Protein Products Ground beef (85% or leaner), grass-fed beef, or beef trimmed of fat. Skinless chicken or [place]. Ground chicken or [place]. Pork trimmed of fat. All fish and seafood. Eggs. Dried beans, peas, or lentils. Unsalted nuts or seeds. Unsalted canned or dry beans. Dairy Low-fat dairy products, such as skim or 1% milk, 2% or reduced-fat cheeses, low-fat ricotta or cottage cheese, or plain low-fat yogurt. Fats and Oils Tub margarines without trans fats. Light or reduced-fat mayonnaise and salad dressings. Avocado. Olive, canola, sesame, or safflower oils. Natural peanut or almond butter (choose ones without added sugar and oil). The items listed above may not be a complete list of recommended foods or beverages. Contact your dietitian for more options. WHAT FOODS ARE NOT RECOMMENDED? Grains White bread. White pasta. White rice. Cornbread. Bagels, pastries, and croissants. Crackers that contain trans fat. Vegetables White potatoes. Corn. Creamed or fried vegetables. Vegetables in a cheese sauce. Fruits Dried fruits. Canned fruit in light or heavy syrup. Fruit juice. Meat and Other Protein Products Fatty cuts of meat. Ribs, chicken wings, bacon, sausage, bologna, salami, chitterlings, fatback, hot dogs, bratwurst, and packaged luncheon meats. Liver and organ meats.  Dairy Whole or 2% milk, cream, half-and-half, and cream cheese. Whole milk cheeses. Whole-fat or sweetened yogurt. Full-fat cheeses. Nondairy creamers and whipped toppings. Processed cheese, cheese spreads, or cheese  curds. Sweets and Desserts Corn syrup, sugars, honey, and molasses. Candy. Jam and jelly. Syrup. Sweetened cereals. Cookies, pies, cakes, donuts, muffins, and ice cream. Fats and Oils Butter, stick margarine, lard, shortening, ghee, or bacon fat. Coconut, palm kernel, or palm oils. Beverages Alcohol. Sweetened drinks (such as sodas, lemonade, and fruit drinks or punches). The items listed above may not be a complete list of foods and beverages to avoid. Contact your dietitian for more information.   This information is not intended to replace advice given to you by your health care provider. Make sure you discuss any questions you have with your health care provider.   Document Released: 12/02/2011 Document Revised: 06/23/2014 Document Reviewed: 09/01/2013 Elsevier Interactive Patient Education 2016 [place].  Diabetes and Foot Care Diabetes may cause you to have problems because of poor blood supply (circulation) to your feet and legs. This may cause the skin on your feet to become thinner, break easier, and heal more slowly. Your skin may become dry, and the skin may peel and crack. You may also have nerve damage in your legs and feet causing decreased feeling in them. You may not notice minor injuries to your feet that could lead to infections or more serious problems. Taking care of your feet is one of the most important things you can do for yourself.  HOME CARE INSTRUCTIONS  Wear shoes at all times, even in the house. Do not go barefoot. Bare feet are easily injured.  Check your feet daily for blisters, cuts, and redness. If you cannot see the bottom of your feet, use a mirror or ask someone for help.  Wash your feet with warm water (do not use hot water) and mild soap. Then pat your feet and the areas between your toes until they are completely dry. Do not soak your feet as this can dry your skin.  Apply a moisturizing lotion or petroleum jelly (that does not contain alcohol and  is unscented) to the skin on your feet and to dry, brittle toenails. Do not apply lotion between your toes.  Trim your toenails straight across. Do not dig under them or around the cuticle. File the edges of your nails with an emery board or nail file.  Do not cut corns or calluses or try to remove them with medicine.  Wear clean socks or stockings every day. Make sure they are not too tight. Do not wear knee-high stockings since they may decrease blood flow to your legs.  Wear shoes that fit properly and have enough cushioning. To break in new shoes, wear them for just a few hours a day. This prevents you from injuring your feet. Always look in your shoes before you put them on to be sure there are no objects inside.  Do not cross your legs. This may decrease the blood flow to your feet.  If you find a minor scrape, cut, or break in the skin on your feet, keep it and the skin around it clean and dry. These areas may be cleansed with mild soap and water. Do not cleanse the area with peroxide, alcohol, or iodine.  When you remove an adhesive bandage, be sure not to damage the skin around it.  If you have a wound, look at it several times a day to make sure  it is healing.  Do not use heating pads or hot water bottles. They may burn your skin. If you have lost feeling in your feet or legs, you may not know it is happening until it is too late.  Make sure your health care provider performs a complete foot exam at least annually or more often if you have foot problems. Report any cuts, sores, or bruises to your health care provider immediately. SEEK MEDICAL CARE IF:   You have an injury that is not healing.  You have cuts or breaks in the skin.  You have an ingrown nail.  You notice redness on your legs or feet.  You feel burning or tingling in your legs or feet.  You have pain or cramps in your legs and feet.  Your legs or feet are numb.  Your feet always feel cold. SEEK IMMEDIATE  MEDICAL CARE IF:   There is increasing redness, swelling, or pain in or around a wound.  There is a red line that goes up your leg.  Pus is coming from a wound.  You develop a fever or as directed by your health care provider.  You notice a bad smell coming from an ulcer or wound.   This information is not intended to replace advice given to you by your health care provider. Make sure you discuss any questions you have with your health care provider.   Document Released: 05/30/2000 Document Revised: 02/02/2013 Document Reviewed: 11/09/2012 Elsevier Interactive Patient Education 2016 [place] in the Home  Falls can cause injuries. They can happen to people of all ages. There are many things you can do to make your home safe and to help prevent falls.  WHAT CAN I DO ON THE OUTSIDE OF MY HOME?  Regularly fix the edges of walkways and driveways and fix any cracks.  Remove anything that might make you trip as you walk through a door, such as a raised step or threshold.  Trim any bushes or trees on the path to your home.  Use bright outdoor lighting.  Clear any walking paths of anything that might make someone trip, such as rocks or tools.  Regularly check to see if handrails are loose or broken. Make sure that both sides of any steps have handrails.  Any raised decks and porches should have guardrails on the edges.  Have any leaves, snow, or ice cleared regularly.  Use sand or salt on walking paths during winter.  Clean up any spills in your garage right away. This includes oil or grease spills. WHAT CAN I DO IN THE BATHROOM?   Use night lights.  Install grab bars by the toilet and in the tub and shower. Do not use towel bars as grab bars.  Use non-skid mats or decals in the tub or shower.  If you need to sit down in the shower, use a plastic, non-slip stool.  Keep the floor dry. Clean up any water that spills on the floor as soon as it  happens.  Remove soap buildup in the tub or shower regularly.  Attach bath mats securely with double-sided non-slip rug tape.  Do not have throw rugs and other things on the floor that can make you trip. WHAT CAN I DO IN THE BEDROOM?  Use night lights.  Make sure that you have a light by your bed that is easy to reach.  Do not use any sheets or blankets that are too big for  your bed. They should not hang down onto the floor.  Have a firm chair that has side arms. You can use this for support while you get dressed.  Do not have throw rugs and other things on the floor that can make you trip. WHAT CAN I DO IN THE KITCHEN?  Clean up any spills right away.  Avoid walking on wet floors.  Keep items that you use a lot in easy-to-reach places.  If you need to reach something above you, use a strong step stool that has a grab bar.  Keep electrical cords out of the way.  Do not use floor polish or wax that makes floors slippery. If you must use wax, use non-skid floor wax.  Do not have throw rugs and other things on the floor that can make you trip. WHAT CAN I DO WITH MY STAIRS?  Do not leave any items on the stairs.  Make sure that there are handrails on both sides of the stairs and use them. Fix handrails that are broken or loose. Make sure that handrails are as long as the stairways.  Check any carpeting to make sure that it is firmly attached to the stairs. Fix any carpet that is loose or worn.  Avoid having throw rugs at the top or bottom of the stairs. If you do have throw rugs, attach them to the floor with carpet tape.  Make sure that you have a light switch at the top of the stairs and the bottom of the stairs. If you do not have them, ask someone to add them for you. WHAT ELSE CAN I DO TO HELP PREVENT FALLS?  Wear shoes that:  Do not have high heels.  Have rubber bottoms.  Are comfortable and fit you well.  Are closed at the toe. Do not wear sandals.  If you  use a stepladder:  Make sure that it is fully opened. Do not climb a closed stepladder.  Make sure that both sides of the stepladder are locked into place.  Ask someone to hold it for you, if possible.  Clearly mark and make sure that you can see:  Any grab bars or handrails.  First and last steps.  Where the edge of each step is.  Use tools that help you move around (mobility aids) if they are needed. These include:  Canes.  Walkers.  Scooters.  Crutches.  Turn on the lights when you go into a dark area. Replace any light bulbs as soon as they burn out.  Set up your furniture so you have a clear path. Avoid moving your furniture around.  If any of your floors are uneven, fix them.  If there are any pets around you, be aware of where they are.  Review your medicines with your doctor. Some medicines can make you feel dizzy. This can increase your chance of falling. Ask your doctor what other things that you can do to help prevent falls.   This information is not intended to replace advice given to you by your health care provider. Make sure you discuss any questions you have with your health care provider.   Document Released: 03/29/2009 Document Revised: 10/17/2014 Document Reviewed: 07/07/2014 Elsevier Interactive Patient Education 2016 [place] Maintenance, Female Adopting a healthy lifestyle and getting preventive care can go a long way to promote health and wellness. Talk with your health care provider about what schedule of regular examinations is right for you. This is a good  chance for you to check in with your provider about disease prevention and staying healthy. In between checkups, there are plenty of things you can do on your own. Experts have done a lot of research about which lifestyle changes and preventive measures are most likely to keep you healthy. Ask your health care provider for more information. WEIGHT AND DIET  Eat a healthy  diet  Be sure to include plenty of vegetables, fruits, low-fat dairy products, and lean protein.  Do not eat a lot of foods high in solid fats, added sugars, or salt.  Get regular exercise. This is one of the most important things you can do for your health.  Most adults should exercise for at least 150 minutes each week. The exercise should increase your heart rate and make you sweat (moderate-intensity exercise).  Most adults should also do strengthening exercises at least twice a week. This is in addition to the moderate-intensity exercise.  Maintain a healthy weight  Body mass index (BMI) is a measurement that can be used to identify possible weight problems. It estimates body fat based on height and weight. Your health care provider can help determine your BMI and help you achieve or maintain a healthy weight.  For females 37 years of age and older:   A BMI below 18.5 is considered underweight.  A BMI of 18.5 to 24.9 is normal.  A BMI of 25 to 29.9 is considered overweight.  A BMI of 30 and above is considered obese.  Watch levels of cholesterol and blood lipids  You should start having your blood tested for lipids and cholesterol at 61 years of age, then have this test every 5 years.  You may need to have your cholesterol levels checked more often if:  Your lipid or cholesterol levels are high.  You are older than 61 years of age.  You are at high risk for heart disease.  CANCER SCREENING   Lung Cancer  Lung cancer screening is recommended for adults 24-51 years old who are at high risk for lung cancer because of a history of smoking.  A yearly low-dose CT scan of the lungs is recommended for people who:  Currently smoke.  Have quit within the past 15 years.  Have at least a 30-pack-year history of smoking. A pack year is smoking an average of one pack of cigarettes a day for 1 year.  Yearly screening should continue until it has been 15 years since you  quit.  Yearly screening should stop if you develop a health problem that would prevent you from having lung cancer treatment.  Breast Cancer  Practice breast self-awareness. This means understanding how your breasts normally appear and feel.  It also means doing regular breast self-exams. Let your health care provider know about any changes, no matter how small.  If you are in your 20s or 30s, you should have a clinical breast exam (CBE) by a health care provider every 1-3 years as part of a regular health exam.  If you are 67 or older, have a CBE every year. Also consider having a breast X-ray (mammogram) every year.  If you have a family history of breast cancer, talk to your health care provider about genetic screening.  If you are at high risk for breast cancer, talk to your health care provider about having an MRI and a mammogram every year.  Breast cancer gene (BRCA) assessment is recommended for women who have family members with BRCA-related cancers. BRCA-related  cancers include:  Breast.  Ovarian.  Tubal.  Peritoneal cancers.  Results of the assessment will determine the need for genetic counseling and BRCA1 and BRCA2 testing. Cervical Cancer Your health care provider may recommend that you be screened regularly for cancer of the pelvic organs (ovaries, uterus, and vagina). This screening involves a pelvic examination, including checking for microscopic changes to the surface of your cervix (Pap test). You may be encouraged to have this screening done every 3 years, beginning at age 55.  For women ages 29-65, health care providers may recommend pelvic exams and Pap testing every 3 years, or they may recommend the Pap and pelvic exam, combined with testing for human papilloma virus (HPV), every 5 years. Some types of HPV increase your risk of cervical cancer. Testing for HPV may also be done on women of any age with unclear Pap test results.  Other health care providers may  not recommend any screening for nonpregnant women who are considered low risk for pelvic cancer and who do not have symptoms. Ask your health care provider if a screening pelvic exam is right for you.  If you have had past treatment for cervical cancer or a condition that could lead to cancer, you need Pap tests and screening for cancer for at least 20 years after your treatment. If Pap tests have been discontinued, your risk factors (such as having a new sexual partner) need to be reassessed to determine if screening should resume. Some women have medical problems that increase the chance of getting cervical cancer. In these cases, your health care provider may recommend more frequent screening and Pap tests. Colorectal Cancer  This type of cancer can be detected and often prevented.  Routine colorectal cancer screening usually begins at 61 years of age and continues through 61 years of age.  Your health care provider may recommend screening at an earlier age if you have risk factors for colon cancer.  Your health care provider may also recommend using home test kits to check for hidden blood in the stool.  A small camera at the end of a tube can be used to examine your colon directly (sigmoidoscopy or colonoscopy). This is done to check for the earliest forms of colorectal cancer.  Routine screening usually begins at age 67.  Direct examination of the colon should be repeated every 5-10 years through 61 years of age. However, you may need to be screened more often if early forms of precancerous polyps or small growths are found. Skin Cancer  Check your skin from head to toe regularly.  Tell your health care provider about any new moles or changes in moles, especially if there is a change in a mole's shape or color.  Also tell your health care provider if you have a mole that is larger than the size of a pencil eraser.  Always use sunscreen. Apply sunscreen liberally and repeatedly  throughout the day.  Protect yourself by wearing long sleeves, pants, a wide-brimmed hat, and sunglasses whenever you are outside. HEART DISEASE, DIABETES, AND HIGH BLOOD PRESSURE   High blood pressure causes heart disease and increases the risk of stroke. High blood pressure is more likely to develop in:  People who have blood pressure in the high end of the normal range (130-139/85-89 mm Hg).  People who are overweight or obese.  People who are African American.  If you are 19-57 years of age, have your blood pressure checked every 3-5 years. If you are 40  years of age or older, have your blood pressure checked every year. You should have your blood pressure measured twice--once when you are at a hospital or clinic, and once when you are not at a hospital or clinic. Record the average of the two measurements. To check your blood pressure when you are not at a hospital or clinic, you can use:  An automated blood pressure machine at a pharmacy.  A home blood pressure monitor.  If you are between 59 years and 81 years old, ask your health care provider if you should take aspirin to prevent strokes.  Have regular diabetes screenings. This involves taking a blood sample to check your fasting blood sugar level.  If you are at a normal weight and have a low risk for diabetes, have this test once every three years after 61 years of age.  If you are overweight and have a high risk for diabetes, consider being tested at a younger age or more often. PREVENTING INFECTION  Hepatitis B  If you have a higher risk for hepatitis B, you should be screened for this virus. You are considered at high risk for hepatitis B if:  You were born in a country where hepatitis B is common. Ask your health care provider which countries are considered high risk.  Your parents were born in a high-risk country, and you have not been immunized against hepatitis B (hepatitis B vaccine).  You have HIV or  AIDS.  You use needles to inject street drugs.  You live with someone who has hepatitis B.  You have had sex with someone who has hepatitis B.  You get hemodialysis treatment.  You take certain medicines for conditions, including cancer, organ transplantation, and autoimmune conditions. Hepatitis C  Blood testing is recommended for:  Everyone born from [birth year] through 1966.  Anyone with known risk factors for hepatitis C. Sexually transmitted infections (STIs)  You should be screened for sexually transmitted infections (STIs) including gonorrhea and chlamydia if:  You are sexually active and are younger than 61 years of age.  You are older than 61 years of age and your health care provider tells you that you are at risk for this type of infection.  Your sexual activity has changed since you were last screened and you are at an increased risk for chlamydia or gonorrhea. Ask your health care provider if you are at risk.  If you do not have HIV, but are at risk, it may be recommended that you take a prescription medicine daily to prevent HIV infection. This is called pre-exposure prophylaxis (PrEP). You are considered at risk if:  You are sexually active and do not regularly use condoms or know the HIV status of your partner(s).  You take drugs by injection.  You are sexually active with a partner who has HIV. Talk with your health care provider about whether you are at high risk of being infected with HIV. If you choose to begin PrEP, you should first be tested for HIV. You should then be tested every 3 months for as long as you are taking PrEP.  PREGNANCY   If you are premenopausal and you may become pregnant, ask your health care provider about preconception counseling.  If you may become pregnant, take 400 to 800 micrograms (mcg) of folic acid every day.  If you want to prevent pregnancy, talk to your health care provider about birth control (contraception). OSTEOPOROSIS AND  MENOPAUSE   Osteoporosis is a disease in  which the bones lose minerals and strength with aging. This can result in serious bone fractures. Your risk for osteoporosis can be identified using a bone density scan.  If you are 9 years of age or older, or if you are at risk for osteoporosis and fractures, ask your health care provider if you should be screened.  Ask your health care provider whether you should take a calcium or vitamin D supplement to lower your risk for osteoporosis.  Menopause may have certain physical symptoms and risks.  Hormone replacement therapy may reduce some of these symptoms and risks. Talk to your health care provider about whether hormone replacement therapy is right for you.  HOME CARE INSTRUCTIONS   Schedule regular health, dental, and eye exams.  Stay current with your immunizations.   Do not use any tobacco products including cigarettes, chewing tobacco, or electronic cigarettes.  If you are pregnant, do not drink alcohol.  If you are breastfeeding, limit how much and how often you drink alcohol.  Limit alcohol intake to no more than 1 drink per day for nonpregnant women. One drink equals 12 ounces of beer, 5 ounces of wine, or 1 ounces of hard liquor.  Do not use street drugs.  Do not share needles.  Ask your health care provider for help if you need support or information about quitting drugs.  Tell your health care provider if you often feel depressed.  Tell your health care provider if you have ever been abused or do not feel safe at home.   This information is not intended to replace advice given to you by your health care provider. Make sure you discuss any questions you have with your health care provider.   Document Released: 12/16/2010 Document Revised: 06/23/2014 Document Reviewed: 05/04/2013 Elsevier Interactive Patient Education 2016 [place] Can Quit Smoking If you are ready to quit smoking or are thinking about it,  congratulations! You have chosen to help yourself be healthier and live longer! There are lots of different ways to quit smoking. Nicotine gum, nicotine patches, a nicotine inhaler, or nicotine nasal spray can help with physical craving. Hypnosis, support groups, and medicines help break the habit of smoking. TIPS TO GET OFF AND STAY OFF CIGARETTES  Learn to predict your moods. Do not let a bad situation be your excuse to have a cigarette. Some situations in your life might tempt you to have a cigarette.  Ask friends and co-workers not to smoke around you.  Make your home smoke-free.  Never have "just one" cigarette. It leads to wanting another and another. Remind yourself of your decision to quit.  On a card, make a list of your reasons for not smoking. Read it at least the same number of times a day as you have a cigarette. Tell yourself everyday, "I do not want to smoke. I choose not to smoke."  Ask someone at home or work to help you with your plan to quit smoking.  Have something planned after you eat or have a cup of coffee. Take a walk or get other exercise to perk you up. This will help to keep you from overeating.  Try a relaxation exercise to calm you down and decrease your stress. Remember, you may be tense and nervous the first two weeks after you quit. This will pass.  Find new activities to keep your hands busy. Play with a pen, coin, or rubber band. Doodle or draw things on paper.  Brush your  teeth right after eating. This will help cut down the craving for the taste of tobacco after meals. You can try mouthwash too.  Try gum, breath mints, or diet candy to keep something in your mouth. IF YOU SMOKE AND WANT TO QUIT:  Do not stock up on cigarettes. Never buy a carton. Wait until one pack is finished before you buy another.  Never carry cigarettes with you at work or at home.  Keep cigarettes as far away from you as possible. Leave them with someone else.  Never carry  matches or a lighter with you.  Ask yourself, "Do I need this cigarette or is this just a reflex?"  Bet with someone that you can quit. Put cigarette money in a piggy bank every morning. If you smoke, you give up the money. If you do not smoke, by the end of the week, you keep the money.  Keep trying. It takes 21 days to change a habit!  Talk to your doctor about using medicines to help you quit. These include nicotine replacement gum, lozenges, or skin patches.   This information is not intended to replace advice given to you by your health care provider. Make sure you discuss any questions you have with your health care provider.   Document Released: 03/29/2009 Document Revised: 08/25/2011 Document Reviewed: 03/29/2009 Elsevier Interactive Patient Education [employer].

## 2016-03-06 NOTE — Progress Notes (Signed)
Subjective:   [name] is a 60 y.o. female who presents for an Initial Medicare Annual Wellness Visit.  Cardiac Risk Factors include: diabetes mellitus;dyslipidemia;hypertension;sedentary lifestyle;smoking/ tobacco exposure     Objective:    Today's Vitals   03/06/16 1125 03/06/16 1127  BP:  (!) 158/66  Pulse:  88  Temp:  98.3 F (36.8 C)  TempSrc:  Oral  SpO2:  100%  Weight: 153 lb 12.8 oz (69.8 kg)   Height: _0  (1.575 m)   PainSc:  6   PainLoc:  Leg   Body mass index is 28.13 kg/m.   Current Medications (verified) Outpatient Encounter Prescriptions as of 03/06/2016  Medication Sig  . aspirin 81 MG tablet Take 1 tablet (81 mg total) by mouth daily.  [name] atorvastatin (LIPITOR) 40 MG tablet Take 1 tablet (40 mg total) by mouth daily.  . Blood Glucose Monitoring Suppl (ONE TOUCH ULTRA 2) W/DEVICE KIT 1 kit by Does not apply route once.  . gabapentin (NEURONTIN) 100 MG capsule Take 1 capsule (100 mg total) by mouth 4 (four) times daily.  [name] glucose blood (ONE TOUCH ULTRA TEST) test strip Test 3 times a day.  . insulin glargine (LANTUS) 100 UNIT/ML injection INJECT 20 UNIITS SUBCUTANEOUSLY ONCE DAILY (Patient taking differently: Inject 40 Units into the skin daily. INJECT 20 UNIITS SUBCUTANEOUSLY ONCE DAILY)  . Lancets (ONETOUCH ULTRASOFT) lancets Use as instructed  . lisinopril-hydrochlorothiazide (ZESTORETIC) 20-12.5 MG tablet Take 1 tablet by mouth daily.  . metoprolol succinate (TOPROL-XL) 50 MG 24 hr tablet Take 1 tablet by mouth  daily with or immediately  following a meal  . mirtazapine (REMERON) 15 MG tablet Take 1 tablet (15 mg total) by mouth at bedtime.  . ONE TOUCH LANCETS MISC 1 Device by Does not apply route 3 (three) times daily.  . SEROQUEL XR 200 MG 24 hr tablet Take 1 tablet (200 mg total) by mouth at bedtime.  [name] tiZANidine (ZANAFLEX) 2 MG tablet Take 1 tablet (2 mg total) by mouth every 8 (eight) hours as needed for muscle spasms.  . feeding  supplement, GLUCERNA SHAKE, (GLUCERNA SHAKE) LIQD Drink one bottle 237 mL daily between meals. (Patient not taking: Reported on 03/06/2016)  . PEDIASURE (PEDIASURE) LIQD Take 237 mLs by mouth daily.   No facility-administered encounter medications on file as of 03/06/2016.     Allergies (verified) Ibuprofen   History: Past Medical History:  Diagnosis Date  . Allergy   . Anxiety   . Arthritis   . Bipolar 2 disorder ([place])   . Cataract   . Chest pain   . Depression   . DM (diabetes mellitus), type 2 ([place])   . GERD (gastroesophageal reflux disease)   . HTN (hypertension)   . Hypercholesteremia   . Marijuana abuse   . Trigger finger    r index inj 12/08, bilateral thumb inj 11/09, r thumb and dequervain's inj 01/2009   Past Surgical History:  Procedure Laterality Date  . ABDOMINAL HYSTERECTOMY  1986   fibroids  . CARDIAC CATHETERIZATION  05/06/2004   normal LV fxn EF>60%  . CYSTECTOMY     back of head  . LEFT HEART CATHETERIZATION WITH CORONARY ANGIOGRAM N/A 05/26/2011   Procedure: LEFT HEART CATHETERIZATION WITH CORONARY ANGIOGRAM;  Surgeon: Larey Dresser, MD;  Location: [facility] CATH LAB;  Service: Cardiovascular;  Laterality: N/A;  radial  . perirpheral iridectomy  02/12/2005   Family History  Problem Relation Age of Onset  . Diabetes  Mother   . Heart disease Mother   . Heart attack Mother   . Bipolar disorder Mother   . Diabetes Father   . Bipolar disorder Sister   . Diabetes Sister   . Bipolar disorder Brother   . Diabetes Brother   . Asthma Son   . Diabetes Sister   . Diabetes Sister   . Hypertension Sister   . Diabetes Sister   . Diabetes Sister   . Diabetes Sister   . Diabetes Brother   . Diabetes Brother   . Diabetes Brother   . Asthma Son   . Colon cancer Neg Hx   . Esophageal cancer Neg Hx    Social History   Occupational History  . 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 Smoking status:  Current Every Day Smoker    Packs/day: 0.50    Years: 30.00    Types: Cigarettes  . Smokeless tobacco: Never Used     Comment: 3-4 cigs per day  . Alcohol use No  . Drug use: No     Comment: not any longer pt denies  . Sexual activity: No    Tobacco Counseling Ready to quit: Yes Counseling given: Yes   Activities of Daily Living In your present state of health, do you have any difficulty performing the following activities: 03/06/2016 10/31/2015  Hearing? N N  Vision? Y N  Difficulty concentrating or making decisions? [name]  Walking or climbing stairs? Y Y  Dressing or bathing? N N  Doing errands, shopping? [name]  Preparing Food and eating ? N -  Using the Toilet? N -  In the past six months, have you accidently leaked urine? Y -  Do you have problems with loss of bowel control? N -  Managing your Medications? Y -  Managing your Finances? N -  Housekeeping or managing your Housekeeping? N -  Some recent data might be hidden  Home Safety:  My home has a working smoke alarm:  Yes X 1           My home throw rugs have been fastened down to the floor or removed:  Removed I have non-slip mats in the bathtub and shower:  Yes         All my home's stairs have railings or bannisters: 2nd floor apt with bannisters         My home's floors, stairs and hallways are free from clutter, wires and cords:  Yes        Immunizations and Health Maintenance Immunization History  Administered Date(s) Administered  . Influenza Split 02/28/2011, 03/02/2012  . Influenza Whole 04/19/2008, 03/22/2009  . Influenza,inj,Quad PF,36+ Mos 05/31/2013, 03/21/2014, 04/11/2015  . Pneumococcal Conjugate-13 03/21/2014  . Pneumococcal Polysaccharide-23 08/14/2012  . Td 09/19/2003  . Tdap 05/30/2015   Health Maintenance Due  Topic Date Due  . ZOSTAVAX  05/23/2015  . INFLUENZA VACCINE  01/15/2016  Flu vaccine administered today Patient will obtain zostavax at local pharmacy  Patient Care Team: Westbrook Bing, DO as PCP - General Patient given list of eye doctors who accept [facility] and list of local dentists including [facility] as patient states she can't afford this service. Indicate any recent Medical Services you may have received from other than [facility] providers in the past year (date may be approximate).     Assessment:   This is a  routine wellness examination for [name].   Hearing/Vision screen  Hearing Screening   Method: Audiometry   _0  _1  _2  _3  _4  _5  _6  _7  _8   Right ear:   _9 Left ear:   _10 Dietary issues and exercise activities discussed: Current Exercise Habits: The patient does not participate in regular exercise at present, Exercise limited by: orthopedic condition(s);respiratory conditions(s)  Goals    . Blood Pressure < 140/90    . Have 3 meals a day          With snacks in between    . HEMOGLOBIN A1C < 8.0    . LDL CALC < 70     Patient also expressed desire to feel more in control of her medications. Discussed making appt with  Dr. Valentina Lucks for this.  Depression Screen PHQ 2/9 Scores 03/06/2016 07/05/2015 06/13/2015 05/30/2015 04/11/2015 08/21/2014 06/19/2014  PHQ - 2 Score 2 0 2 0 2 0 4  PHQ- 9 Score 14 - - - 8 - 14  Patient given contact info for Behavioral Health and PCP notified Fall Risk Fall Risk  03/06/2016 03/06/2016 06/13/2015 10/11/2014 06/19/2014  Falls in the past year? - Yes No Yes Yes  Number falls in past yr: - 2 or more - 1 1  Injury with Fall? - Yes - Yes Yes  Risk Factor Category  - High Fall Risk - - -  Risk for fall due to : Medication side effect;History of fall(s);Impaired balance/gait;Impaired vision History of fall(s);Impaired vision;Impaired balance/gait - - Impaired balance/gait  Follow up - Education provided;Falls prevention discussed - - -  Falls prevention discussed and literature provided  Cognitive Function: Mini-Cog  Failed with score 2/5  TUG Test:  Done  in 14 seconds. Patient used both hands to push out of chair and to sit back down.  Screening Tests Health Maintenance  Topic Date Due  . ZOSTAVAX  05/23/2015  . INFLUENZA VACCINE  01/15/2016  . FOOT EXAM  05/29/2016  . HEMOGLOBIN A1C  06/21/2016  . OPHTHALMOLOGY EXAM  01/01/2017  . MAMMOGRAM  07/22/2017  . PNEUMOCOCCAL POLYSACCHARIDE VACCINE (2) 08/14/2017  . COLONOSCOPY  09/13/2018  . TETANUS/TDAP  05/29/2025  . Hepatitis C Screening  Completed  . HIV Screening  Completed      Plan:   Patient reported 3 low blood sugar readings within last 2 weeks (40 and 56 X 2) S/sx of hypoglycemia and immediate actions to take discussed in detail. Patient states she usually eats several pieces of hard candy when BS is low. Advised to schedule appt with PCP to discuss. Patient has appt 03/21/2016 with PCP. Also encouraged patient to make appt with Dr. Valentina Lucks for medication management.  Patient also c/o of left hand and left arm numbness for "months" denies all other symptoms. Again encourage to discuss with PCP as soon as possible.   During the course of the visit, [name] was educated and counseled about the following appropriate screening and preventive services:   Vaccines to include Pneumoccal, Influenza, Td, Zostavax  Cardiovascular disease screening  Colorectal cancer screening  Bone density screening  Diabetes screening  Glaucoma screening  Mammography/PAP  Nutrition counseling  Smoking cessation counseling  Patient Instructions (the written plan) were given to the patient.    Velora Heckler, RN   03/06/2016

## 2016-03-21 ENCOUNTER — Encounter: Payer: Self-pay | Admitting: Family Medicine

## 2016-03-21 ENCOUNTER — Ambulatory Visit (INDEPENDENT_AMBULATORY_CARE_PROVIDER_SITE_OTHER): Payer: Medicare Other | Admitting: Family Medicine

## 2016-03-21 VITALS — BP 140/70 | HR 80 | Temp 98.4°F | Ht 62.0 in | Wt 158.0 lb

## 2016-03-21 DIAGNOSIS — I10 Essential (primary) hypertension: Secondary | ICD-10-CM

## 2016-03-21 DIAGNOSIS — E1143 Type 2 diabetes mellitus with diabetic autonomic (poly)neuropathy: Secondary | ICD-10-CM

## 2016-03-21 DIAGNOSIS — E1100 Type 2 diabetes mellitus with hyperosmolarity without nonketotic hyperglycemic-hyperosmolar coma (NKHHC): Secondary | ICD-10-CM | POA: Diagnosis not present

## 2016-03-21 DIAGNOSIS — Z720 Tobacco use: Secondary | ICD-10-CM | POA: Diagnosis not present

## 2016-03-21 DIAGNOSIS — R630 Anorexia: Secondary | ICD-10-CM

## 2016-03-21 LAB — BASIC METABOLIC PANEL WITH GFR
BUN: 8 mg/dL (ref 7–25)
CALCIUM: 9.4 mg/dL (ref 8.6–10.4)
CO2: 32 mmol/L — ABNORMAL HIGH (ref 20–31)
CREATININE: 0.76 mg/dL (ref 0.50–0.99)
Chloride: 107 mmol/L (ref 98–110)
GFR, Est Non African American: 86 mL/min (ref >=60)
GLUCOSE: 95 mg/dL (ref 65–99)
Potassium: 3.7 mmol/L (ref 3.5–5.3)
Sodium: 145 mmol/L (ref 135–146)

## 2016-03-21 LAB — POCT GLYCOSYLATED HEMOGLOBIN (HGB A1C): Hemoglobin A1C: 10.7

## 2016-03-21 NOTE — Progress Notes (Signed)
I have reviewed these medications, the problem list, past medical and surgical his and agree with the assessment. -- Harriet Butte, [place], PGY-1

## 2016-03-21 NOTE — Assessment & Plan Note (Addendum)
Chronic. BP improved at 140/70. Was restarted on Lisinopril-HCTZ during last visit on 01/2016. Creatinine midly elevated from baseline at 1.2. Denies symptoms of headache, change in vision. - Lisinopril-HCTZ 20-12.5 mg QD - Will obtain BMET to evaluate kidney funtion today

## 2016-03-21 NOTE — Assessment & Plan Note (Addendum)
Chronic use. Has 7.5 pack year history. Currently smokes 5-6 cigs per day. Has cut down from past and seems willing to cut back more. Continues to have dry cough for past few months. - Encouraged patient to reduce number of cigs to 3-4 per day, patient seems to be agreeable - Patient knows to call QUIT-NOW hotline - Educated patient on long term consequences of smoking

## 2016-03-21 NOTE — Patient Instructions (Addendum)
It was a pleasure to meet you today. Please see below to review our plan for today's visit.  1. Continue check blood sugar levels every morning before your meal and 1-2 hrs after meals. Try and eat a balanced diet restricting excessive carbs including rice, potatoes, bread, sweets. 2. Please schedule appointment for Dr. Valentina Lucks concerning diabetes treatment options. Lantus is not enough to control your diabetes. 3. I have increased you Gabapentin to 5 pills total per day. This will now say three times per day, your first and second dose will be x1 pill but your bedtime dose will be x3 pills. 4. I have given you information for nutrition. Please call Dr. Jenne Campus for appointment concerning diet. 5. Please go get your Zostavax as I prescribed last visit. 6. I will see you in 1 month, I will notify you of the blood work results.  Please call the clinic at [phone] if your symptoms worsen or you have any concerns. It was my pleasure to see you. -- Harriet Butte, [place], PGY-1  Diabetes Mellitus and Food It is important for you to manage your blood sugar (glucose) level. Your blood glucose level can be greatly affected by what you eat. Eating healthier foods in the appropriate amounts throughout the day at about the same time each day will help you control your blood glucose level. It can also help slow or prevent worsening of your diabetes mellitus. Healthy eating may even help you improve the level of your blood pressure and reach or maintain a healthy weight.  General recommendations for healthful eating and cooking habits include:  Eating meals and snacks regularly. Avoid going long periods of time without eating to lose weight.  Eating a diet that consists mainly of plant-based foods, such as fruits, vegetables, nuts, legumes, and whole grains.  Using low-heat cooking methods, such as baking, instead of high-heat cooking methods, such as deep frying. Work with your  dietitian to make sure you understand how to use the Nutrition Facts information on food labels. HOW CAN FOOD AFFECT ME? Carbohydrates Carbohydrates affect your blood glucose level more than any other type of food. Your dietitian will help you determine how many carbohydrates to eat at each meal and teach you how to count carbohydrates. Counting carbohydrates is important to keep your blood glucose at a healthy level, especially if you are using insulin or taking certain medicines for diabetes mellitus. Alcohol Alcohol can cause sudden decreases in blood glucose (hypoglycemia), especially if you use insulin or take certain medicines for diabetes mellitus. Hypoglycemia can be a life-threatening condition. Symptoms of hypoglycemia (sleepiness, dizziness, and disorientation) are similar to symptoms of having too much alcohol.  If your health care provider has given you approval to drink alcohol, do so in moderation and use the following guidelines:  Women should not have more than one drink per day, and men should not have more than two drinks per day. One drink is equal to:  12 oz of beer.  5 oz of wine.  1 oz of hard liquor.  Do not drink on an empty stomach.  Keep yourself hydrated. Have water, diet soda, or unsweetened iced tea.  Regular soda, juice, and other mixers might contain a lot of carbohydrates and should be counted. WHAT FOODS ARE NOT RECOMMENDED? As you make food choices, it is important to remember that all foods are not the same. Some foods have fewer nutrients per serving than other foods, even though they might have  the same number of calories or carbohydrates. It is difficult to get your body what it needs when you eat foods with fewer nutrients. Examples of foods that you should avoid that are high in calories and carbohydrates but low in nutrients include:  Trans fats (most processed foods list trans fats on the Nutrition Facts label).  Regular  soda.  Juice.  Candy.  Sweets, such as cake, pie, doughnuts, and cookies.  Fried foods. WHAT FOODS CAN I EAT? Eat nutrient-rich foods, which will nourish your body and keep you healthy. The food you should eat also will depend on several factors, including:  The calories you need.  The medicines you take.  Your weight.  Your blood glucose level.  Your blood pressure level.  Your cholesterol level. You should eat a variety of foods, including:  Protein.  Lean cuts of meat.  Proteins low in saturated fats, such as fish, egg whites, and beans. Avoid processed meats.  Fruits and vegetables.  Fruits and vegetables that may help control blood glucose levels, such as apples, mangoes, and yams.  Dairy products.  Choose fat-free or low-fat dairy products, such as milk, yogurt, and cheese.  Grains, bread, pasta, and rice.  Choose whole grain products, such as multigrain bread, whole oats, and brown rice. These foods may help control blood pressure.  Fats.  Foods containing healthful fats, such as nuts, avocado, olive oil, canola oil, and fish. DOES EVERYONE WITH DIABETES MELLITUS HAVE THE SAME MEAL PLAN? Because every person with diabetes mellitus is different, there is not one meal plan that works for everyone. It is very important that you meet with a dietitian who will help you create a meal plan that is just right for you.   This information is not intended to replace advice given to you by your health care provider. Make sure you discuss any questions you have with your health care provider.   Document Released: 02/27/2005 Document Revised: 06/23/2014 Document Reviewed: 04/29/2013 Elsevier Interactive Patient Education [employer].

## 2016-03-21 NOTE — Assessment & Plan Note (Deleted)
A 

## 2016-03-21 NOTE — Assessment & Plan Note (Addendum)
Uncontrolled. A1c 10.7 in office today. Patient is on Lantus 40 units and compliant however her levels continue to be elevated in the mid to high 200s. Did have 1 episode at 27 when patient did not eat for a large portion of the day. Has a history of dropping to 92s with hospitalizations. Reluctant to add bolus insulin to regimen. Continues to have daytime naps, polydipsia, and polyuria. Eats 1 meal per day and snacks carb rich foods regularly. - Lantus 40 units QD - Needs additional medications but doe not want Novolog or Metformin due to frequent needle sticks and side effects respectively - Diet is poor and irregular, will see nutrition with Dr. Jenne Campus, card given to patient to make appointment, spent large portion of visit discussing diet - Will consider referral to [facility] during next visit - Encouraged to go get Zostavax which I prescribed last visit - F/u in 1 month

## 2016-03-21 NOTE — Progress Notes (Signed)
Subjective:   Patient ID: [name]    DOB: 12/05/1955, 60 y.o. female   MRN: [id number]  CC: Diabetes   HPI: Taniaya A Hockley is a 60 y.o. female who presents to clinic today for management of her diabetes. Problems discussed today are as follows:  Diabetes: has been checking her sugars with meter. Says she has been running in the high 200s. Used to have some lows in the 40s a few months ago but not having them over the past month. Taking Lantus 40 units daily and misses a dose every 2 weeks. Admits to poor eating habits. May limit herself to 1 large meal in the day and have several carb-rich snacks. Says she knows what foods to avoid but eats what she likes. Says she is not interested in bolus insulin because she does not want to give herself extra shots. Was on metformin is past but made her very nauseous.  Hypertension: was hospitalized a few months ago for low blood sugar and had to stop her Lisinopril-HCTZ. Restarted her meds last visit and has tolerated them well. Denies headaches, change in vision, feelings of dizziness.  Tobacco Abuse: continues to smoke 5-6 cigs daily. Used to smoke 1-1/2 pack per day. Has tried to cut back but says "she needs it for her nerves." Has occasional cough but no sputum production. Denies dyspnea, chest pain, or wheezing. Says she would like to cut back but is not ready today.  ROS: See HPI for pertinent ROS.  [place]: Pertinent past medical, surgical, family, and social history were reviewed and updated as appropriate. Smoking status reviewed.  Medications reviewed. Current Outpatient Prescriptions  Medication Sig Dispense Refill  . aspirin 81 MG tablet Take 1 tablet (81 mg total) by mouth daily. 30 tablet 11  . atorvastatin (LIPITOR) 40 MG tablet Take 1 tablet (40 mg total) by mouth daily. 90 tablet 3  . Blood Glucose Monitoring Suppl (ONE TOUCH ULTRA 2) W/DEVICE KIT 1 kit by Does not apply route once. 1 each 0  . gabapentin (NEURONTIN) 100 MG  capsule Take 1 capsule (100 mg total) by mouth 4 (four) times daily. (Patient taking differently: Take 100 mg by mouth 3 (three) times daily. Please take 1 tab in morning, 1 tab at lunch, and 3 tabs before bedtime) 120 capsule 5  . glucose blood (ONE TOUCH ULTRA TEST) test strip Test 3 times a day. 100 each 12  . insulin glargine (LANTUS) 100 UNIT/ML injection INJECT 20 UNIITS SUBCUTANEOUSLY ONCE DAILY (Patient taking differently: Inject 40 Units into the skin daily. INJECT 20 UNIITS SUBCUTANEOUSLY ONCE DAILY) 20 vial 5  . Lancets (ONETOUCH ULTRASOFT) lancets Use as instructed 100 each 12  . lisinopril-hydrochlorothiazide (ZESTORETIC) 20-12.5 MG tablet Take 1 tablet by mouth daily.    . metoprolol succinate (TOPROL-XL) 50 MG 24 hr tablet Take 1 tablet by mouth  daily with or immediately  following a meal 90 tablet 3  . mirtazapine (REMERON) 15 MG tablet Take 1 tablet (15 mg total) by mouth at bedtime. 30 tablet 2  . ONE TOUCH LANCETS MISC 1 Device by Does not apply route 3 (three) times daily. 200 each 6  . SEROQUEL XR 200 MG 24 hr tablet Take 1 tablet (200 mg total) by mouth at bedtime. 30 tablet 2  . tiZANidine (ZANAFLEX) 2 MG tablet Take 1 tablet (2 mg total) by mouth every 8 (eight) hours as needed for muscle spasms. 30 tablet 2   No current facility-administered medications for this  visit.     Objective:   BP 140/70   Pulse 80   Temp 98.4 F (36.9 C) (Oral)   Ht 5' 2"  (1.575 m)   Wt 158 lb (71.7 kg)   BMI 28.90 kg/m  Vitals and nursing note reviewed.  General: well nourished, well developed, in no acute distress with non-toxic appearance HEENT: normocephalic, atraumatic, moist mucous membranes Neck: supple, non-tender without lymphadenopathy CV: regular rate and rhythm without murmurs, rubs, or gallops Lungs: clear to auscultation bilaterally with normal work of breathing Abdomen: soft, non-tender, non-distended, no masses or organomegaly palpable, normoactive bowel sounds Skin:  warm, dry, no rashes or lesions, cap refill < 2 seconds Extremities: warm and well perfused, normal tone  Assessment & Plan:   DM (diabetes mellitus), type 2, uncontrolled (HCC) Uncontrolled. A1c 10.7 in office today. Patient is on Lantus 40 units and compliant however her levels continue to be elevated in the mid to high 200s. Did have 1 episode at 23 when patient did not eat for a large portion of the day. Has a history of dropping to 42s with hospitalizations. Reluctant to add bolus insulin to regimen. Continues to have daytime naps, polydipsia, and polyuria. Eats 1 meal per day and snacks carb rich foods regularly. - Lantus 40 units QD - Needs additional medications but doe not want Novolog or Metformin due to frequent needle sticks and side effects respectively - Diet is poor and irregular, will see nutrition with Dr. Jenne Campus, card given to patient to make appointment, spent large portion of visit discussing diet - Will consider referral to [facility] during next visit - Encouraged to go get Zostavax which I prescribed last visit - F/u in 1 month  Hypertension Chronic. BP improved at 140/70. Was restarted on Lisinopril-HCTZ during last visit on 01/2016. Creatinine midly elevated from baseline at 1.2. Denies symptoms of headache, change in vision. - Lisinopril-HCTZ 20-12.5 mg QD - Will obtain BMET to evaluate kidney funtion today  Tobacco abuse Chronic use. Has 7.5 pack year history. Currently smokes 5-6 cigs per day. Has cut down from past and seems willing to cut back more. Continues to have dry cough for past few months. 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 Effects upper and lower extremities. On low dose Gabapentin with minimal relief. Says pain in halves bilaterally occasionally wit  sharp pain but resolves over 20 mins. No back pain history and motor strength intact at lower extremities. - Will change dose from gabapention 100 TID, 1-1-2 to 1-1-3, due to insomnia 2/2 daytime naps - Will reassess during next visit in 1 month - Could consider syphilis screen during next visit?  Orders Placed This Encounter  Procedures  . BASIC METABOLIC PANEL WITH GFR  . Amb ref to Medical Nutrition Therapy-MNT    Referral Priority:   Routine    Referral Type:   Consultation    Referral Reason:   Specialty Services Required    Requested Specialty:   Nutrition    Number of Visits Requested:   1  . POCT glycosylated hemoglobin (Hb A1C)   No orders of the defined types were placed in this encounter.   Harriet Butte, [place], PGY-1 03/22/2016 7:42 AM

## 2016-03-21 NOTE — Assessment & Plan Note (Addendum)
Chronic. Effects upper and lower extremities. On low dose Gabapentin with minimal relief. Says pain in halves bilaterally occasionally wit sharp pain but resolves over 20 mins. No back pain history and motor strength intact at lower extremities. - Will change dose from gabapention 100 TID, 1-1-2 to 1-1-3, due to insomnia 2/2 daytime naps - Will reassess during next visit in 1 month - Could consider syphilis screen during next visit?

## 2016-04-14 ENCOUNTER — Ambulatory Visit: Payer: Medicare Other | Admitting: Pharmacist

## 2016-04-24 ENCOUNTER — Other Ambulatory Visit: Payer: Self-pay | Admitting: Family Medicine

## 2016-04-24 DIAGNOSIS — I10 Essential (primary) hypertension: Secondary | ICD-10-CM

## 2016-04-24 DIAGNOSIS — G6289 Other specified polyneuropathies: Secondary | ICD-10-CM

## 2016-04-24 MED ORDER — GABAPENTIN 100 MG PO CAPS: 100.0000 mg | ORAL_CAPSULE | Freq: Four times a day (QID) | ORAL | 5 refills | Status: DC

## 2016-04-24 MED ORDER — ATORVASTATIN CALCIUM 40 MG PO TABS
40.0000 mg | ORAL_TABLET | Freq: Every day | ORAL | 3 refills | Status: DC
Start: 2016-04-24 — End: 2018-03-04

## 2016-04-24 MED ORDER — LISINOPRIL-HYDROCHLOROTHIAZIDE 20-12.5 MG PO TABS: 1.0000 | ORAL_TABLET | Freq: Every day | ORAL | Status: DC

## 2016-04-24 MED ORDER — METOPROLOL SUCCINATE ER 50 MG PO TB24: ORAL_TABLET | ORAL | 3 refills | Status: DC

## 2016-04-24 NOTE — Telephone Encounter (Signed)
Pt needs refills on atorvastatin, lisinopril, gabapentin, and metoprolol. Pt uses [facility] on [address]. Please advise. Thanks! ep

## 2016-05-02 ENCOUNTER — Ambulatory Visit (INDEPENDENT_AMBULATORY_CARE_PROVIDER_SITE_OTHER): Payer: Medicare Other | Admitting: Family Medicine

## 2016-05-02 ENCOUNTER — Encounter: Payer: Self-pay | Admitting: Family Medicine

## 2016-05-02 DIAGNOSIS — IMO0002 Reserved for concepts with insufficient information to code with codable children: Secondary | ICD-10-CM

## 2016-05-02 DIAGNOSIS — E1100 Type 2 diabetes mellitus with hyperosmolarity without nonketotic hyperglycemic-hyperosmolar coma (NKHHC): Secondary | ICD-10-CM

## 2016-05-02 DIAGNOSIS — E1165 Type 2 diabetes mellitus with hyperglycemia: Secondary | ICD-10-CM | POA: Diagnosis not present

## 2016-05-02 DIAGNOSIS — Z794 Long term (current) use of insulin: Secondary | ICD-10-CM

## 2016-05-02 DIAGNOSIS — E1142 Type 2 diabetes mellitus with diabetic polyneuropathy: Secondary | ICD-10-CM | POA: Diagnosis not present

## 2016-05-02 MED ORDER — EMPAGLIFLOZIN 10 MG PO TABS: 10.0000 mg | ORAL_TABLET | Freq: Every day | ORAL | 0 refills | Status: DC

## 2016-05-02 MED ORDER — INSULIN GLARGINE 100 UNIT/ML ~~LOC~~ SOLN: SUBCUTANEOUS | 5 refills | Status: DC

## 2016-05-02 MED ORDER — INSULIN LISPRO 100 UNIT/ML (KWIKPEN): 5.0000 [IU] | PEN_INJECTOR | Freq: Three times a day (TID) | SUBCUTANEOUS | 11 refills | Status: DC

## 2016-05-02 NOTE — Patient Instructions (Signed)
It was a pleasure to meet you today. Please see below to review our plan for today's visit.  1. Schedule appointment at front desk with Dr. Valentina Lucks in 2 weeks when you leave. 2. You will decrease your Lantus insulin to 30 units once every morning. 3. I have added on Humalog insulin which you will take 5 units with every meal (handfull of food or greater). 4. You have also been given Jardiance 10 mg once daily but do not take this until you see Dr. Valentina Lucks in 2 weeks. 5. Check you sugar levels before meals if you are unsure if you need the Humalog. If the level is around 100 you dont need to but if your level in 200 or greater, take the Humalog with the food. 6. Follow up with me in 1 month.  Please call the clinic at [phone] if your symptoms worsen or you have any concerns. It was my pleasure to see you. -- Harriet Butte, [place], PGY-1

## 2016-05-02 NOTE — Assessment & Plan Note (Addendum)
Brittle DM. Only on basal with uncontrolled levels. Eating is inconsistent and pt does not avoid excess carbs. Discussed need for additional meds given uncontrolled levels. Discussed this with Dr. Valentina Lucks. --Decreasing Lantus to 30 units daily in AM --Adding Humalog 5 units with every meal (handful of food or more) --Given Rx for Jardiance to pick up but told not to take until f/u with Dr. Valentina Lucks in 2 weeks --F/u in 1 month

## 2016-05-02 NOTE — Progress Notes (Signed)
Subjective:   Patient ID: [name]    DOB: Feb 23, 1956, 60 y.o. female   MRN: [id number]  CC: Diabetes  HPI: [name] is a 60 y.o. female who presents to clinic today for diabetes follow up. Problems discussed today are as follows:  Diabetes Mellitus Type 2: Patient says her sugar levels are not well controlled. Has been getting CBG 300-400 and as low as 53. Says she eats irregularly and will some days not eat because she is not hungry. Patient admits to eating what she wants when she wants. Enjoys sodas, juices, and fruit. Has increased her Lantus 1.5 weeks ago from 40 units to 45 units.  ROS: Complete ROS performed, see HPI for pertinent ROS.  [place]: Pertinent past medical, surgical, family, and social history were reviewed and updated as appropriate. Smoking status reviewed.  Medications reviewed. Current Outpatient Prescriptions  Medication Sig Dispense Refill  . aspirin 81 MG tablet Take 1 tablet (81 mg total) by mouth daily. 30 tablet 11  . atorvastatin (LIPITOR) 40 MG tablet Take 1 tablet (40 mg total) by mouth daily. 90 tablet 3  . Blood Glucose Monitoring Suppl (ONE TOUCH ULTRA 2) W/DEVICE KIT 1 kit by Does not apply route once. 1 each 0  . gabapentin (NEURONTIN) 100 MG capsule Take 1 capsule (100 mg total) by mouth 4 (four) times daily. 120 capsule 5  . glucose blood (ONE TOUCH ULTRA TEST) test strip Test 3 times a day. 100 each 12  . insulin glargine (LANTUS) 100 UNIT/ML injection INJECT 30 UNIITS SUBCUTANEOUSLY ONCE DAILY 20 vial 5  . Lancets (ONETOUCH ULTRASOFT) lancets Use as instructed 100 each 12  . lisinopril-hydrochlorothiazide (ZESTORETIC) 20-12.5 MG tablet Take 1 tablet by mouth daily.    . metoprolol succinate (TOPROL-XL) 50 MG 24 hr tablet Take 1 tablet by mouth  daily with or immediately  following a meal 90 tablet 3  . mirtazapine (REMERON) 15 MG tablet Take 1 tablet (15 mg total) by mouth at bedtime. 30 tablet 2  . ONE TOUCH LANCETS MISC 1 Device  by Does not apply route 3 (three) times daily. 200 each 6  . SEROQUEL XR 200 MG 24 hr tablet Take 1 tablet (200 mg total) by mouth at bedtime. 30 tablet 2  . tiZANidine (ZANAFLEX) 2 MG tablet Take 1 tablet (2 mg total) by mouth every 8 (eight) hours as needed for muscle spasms. 30 tablet 2  . empagliflozin (JARDIANCE) 10 MG TABS tablet Take 10 mg by mouth daily. 30 tablet 0  . insulin lispro (HUMALOG KWIKPEN) 100 UNIT/ML KiwkPen Inject 0.05 mLs (5 Units total) into the skin 3 (three) times daily with meals. 3 mL 11   No current facility-administered medications for this visit.     Objective:   BP 138/72   Pulse 91   Temp 97.6 F (36.4 C) (Oral)   Wt 159 lb (72.1 kg)   BMI 29.08 kg/m  Vitals and nursing note reviewed.  General: well nourished, well developed, in no acute distress with non-toxic appearance HEENT: normocephalic, atraumatic, moist mucous membranes Neck: supple, non-tender without lymphadenopathy CV: regular rate and rhythm without murmurs, rubs, or gallops, no lower extremity edema Lungs: clear to auscultation bilaterally with normal work of breathing Abdomen: soft, non-tender, non-distended, no masses or organomegaly palpable, normoactive bowel sounds Skin: warm, dry, no rashes or lesions, cap refill < 2 seconds Extremities: warm and well perfused, normal tone  Assessment & Plan:   DM (diabetes mellitus), type 2, uncontrolled (  [place]) Brittle DM. Only on basal with uncontrolled levels. Eating is inconsistent and pt does not avoid excess carbs. Discussed need for additional meds given uncontrolled levels. Discussed this with Dr. Valentina Lucks. --Decreasing Lantus to 30 units daily in AM --Adding Humalog 5 units with every meal (handful of food or more) --Given Rx for Jardiance to pick up but told not to take until f/u with Dr. Valentina Lucks in 2 weeks --F/u in 1 month  No orders of the defined types were placed in this encounter.  Meds ordered this encounter  Medications  .  insulin lispro (HUMALOG KWIKPEN) 100 UNIT/ML KiwkPen    Sig: Inject 0.05 mLs (5 Units total) into the skin 3 (three) times daily with meals.    Dispense:  3 mL    Refill:  11  . empagliflozin (JARDIANCE) 10 MG TABS tablet    Sig: Take 10 mg by mouth daily.    Dispense:  30 tablet    Refill:  0  . insulin glargine (LANTUS) 100 UNIT/ML injection    Sig: INJECT 30 UNIITS SUBCUTANEOUSLY ONCE DAILY    Dispense:  20 vial    Refill:  [place], DO [place], PGY-1 05/03/2016 8:24 PM

## 2016-05-15 ENCOUNTER — Telehealth: Payer: Self-pay | Admitting: Family Medicine

## 2016-05-15 ENCOUNTER — Other Ambulatory Visit: Payer: Self-pay | Admitting: *Deleted

## 2016-05-15 ENCOUNTER — Ambulatory Visit: Payer: Medicare Other | Admitting: Pharmacist

## 2016-05-15 DIAGNOSIS — Z794 Long term (current) use of insulin: Principal | ICD-10-CM

## 2016-05-15 DIAGNOSIS — E1165 Type 2 diabetes mellitus with hyperglycemia: Principal | ICD-10-CM

## 2016-05-15 DIAGNOSIS — IMO0002 Reserved for concepts with insufficient information to code with codable children: Secondary | ICD-10-CM

## 2016-05-15 DIAGNOSIS — E1142 Type 2 diabetes mellitus with diabetic polyneuropathy: Secondary | ICD-10-CM

## 2016-05-15 MED ORDER — GLUCOSE BLOOD VI STRP: ORAL_STRIP | 12 refills | Status: DC

## 2016-05-15 NOTE — Telephone Encounter (Signed)
DMV form dropped off for at front desk for completion.  Verified that patient section of form has been completed.  -Last DOS with PCP was 05-02-2016.  Placed form in team- -folder- to be completed by clinical staff.  Rosa A Charlies Constable

## 2016-05-16 NOTE — Telephone Encounter (Signed)
Form placed in PCP box 

## 2016-05-20 NOTE — Telephone Encounter (Signed)
Patient informed that placard is complete and ready for pickup.  Derl Barrow, RN

## 2016-05-20 NOTE — Telephone Encounter (Signed)
Form reviewed and signed for temporary 6 month handicap place card. Please call patient and let her know she can pick it up. Thank you. -- Harriet Butte, [place], PGY-1

## 2016-05-26 ENCOUNTER — Other Ambulatory Visit: Payer: Self-pay | Admitting: *Deleted

## 2016-05-26 MED ORDER — LISINOPRIL-HYDROCHLOROTHIAZIDE 20-12.5 MG PO TABS: 1.0000 | ORAL_TABLET | Freq: Every day | ORAL | Status: DC

## 2016-05-27 ENCOUNTER — Telehealth: Payer: Self-pay | Admitting: Family Medicine

## 2016-05-27 MED ORDER — LISINOPRIL-HYDROCHLOROTHIAZIDE 20-12.5 MG PO TABS: 1.0000 | ORAL_TABLET | Freq: Every day | ORAL | 3 refills | Status: DC

## 2016-05-27 NOTE — Telephone Encounter (Signed)
Mistake with previous Rx refill for Zestoretic. Reordered with #90 pills with 3 refills. -- Harriet Butte, [place], PGY-1

## 2016-05-27 NOTE — Addendum Note (Signed)
Addended by: Christen Bame D on: 05/27/2016 02:01 PM   Modules accepted: Orders

## 2016-05-27 NOTE — Telephone Encounter (Addendum)
Recieved call from pharmacy.  The Rx was approved, but not sent and there was no quantity or # or refills.  I have added the quantity and # of refill.  Will have MD review and re approve. Riku Buttery, [place], CMA

## 2016-05-29 ENCOUNTER — Ambulatory Visit: Payer: Medicare Other | Admitting: Pharmacist

## 2016-06-03 ENCOUNTER — Encounter (HOSPITAL_BASED_OUTPATIENT_CLINIC_OR_DEPARTMENT_OTHER): Payer: Self-pay | Admitting: *Deleted

## 2016-06-03 ENCOUNTER — Emergency Department (HOSPITAL_BASED_OUTPATIENT_CLINIC_OR_DEPARTMENT_OTHER)
Admission: EM | Admit: 2016-06-03 | Discharge: 2016-06-03 | Disposition: A | Discharge status: Home / Self Care | Payer: Medicare Other | Attending: Emergency Medicine | Admitting: Emergency Medicine

## 2016-06-03 DIAGNOSIS — H43811 Vitreous degeneration, right eye: Secondary | ICD-10-CM | POA: Diagnosis not present

## 2016-06-03 DIAGNOSIS — Z79899 Other long term (current) drug therapy: Secondary | ICD-10-CM | POA: Insufficient documentation

## 2016-06-03 DIAGNOSIS — I110 Hypertensive heart disease with heart failure: Secondary | ICD-10-CM | POA: Diagnosis not present

## 2016-06-03 DIAGNOSIS — Z794 Long term (current) use of insulin: Secondary | ICD-10-CM | POA: Insufficient documentation

## 2016-06-03 DIAGNOSIS — Z7982 Long term (current) use of aspirin: Secondary | ICD-10-CM | POA: Insufficient documentation

## 2016-06-03 DIAGNOSIS — I5032 Chronic diastolic (congestive) heart failure: Secondary | ICD-10-CM | POA: Insufficient documentation

## 2016-06-03 DIAGNOSIS — E119 Type 2 diabetes mellitus without complications: Secondary | ICD-10-CM | POA: Diagnosis not present

## 2016-06-03 DIAGNOSIS — H5711 Ocular pain, right eye: Secondary | ICD-10-CM | POA: Diagnosis present

## 2016-06-03 DIAGNOSIS — F17210 Nicotine dependence, cigarettes, uncomplicated: Secondary | ICD-10-CM | POA: Diagnosis not present

## 2016-06-03 DIAGNOSIS — H2513 Age-related nuclear cataract, bilateral: Secondary | ICD-10-CM | POA: Diagnosis not present

## 2016-06-03 DIAGNOSIS — H4311 Vitreous hemorrhage, right eye: Secondary | ICD-10-CM | POA: Diagnosis not present

## 2016-06-03 DIAGNOSIS — H40033 Anatomical narrow angle, bilateral: Secondary | ICD-10-CM | POA: Diagnosis not present

## 2016-06-03 MED ORDER — FLUORESCEIN SODIUM 0.6 MG OP STRP
1.0000 | ORAL_STRIP | Freq: Once | OPHTHALMIC | Status: AC
  Administered 2016-06-03: 1 via OPHTHALMIC
  Filled 2016-06-03: qty 1

## 2016-06-03 MED ORDER — TETRACAINE HCL 0.5 % OP SOLN
2.0000 [drp] | Freq: Once | OPHTHALMIC | Status: AC
  Administered 2016-06-03: 2 [drp] via OPHTHALMIC
  Filled 2016-06-03: qty 4

## 2016-06-03 NOTE — ED Provider Notes (Signed)
[place] DEPT [facility] Provider Note   CSN: [id number] Arrival date & time: 06/03/16  1651  By signing my name below, I, [name], attest that this documentation has been prepared under the direction and in the presence of Deno Etienne, DO  Electronically Signed: Delton Williams, ED Scribe. 06/03/16. 5:19 PM.   History   Chief Complaint Chief Complaint  Patient presents with  . Eye Problem    The history is provided by the patient. No language interpreter was used.   HPI Comments:  [name] is a 60 y.o. female who presents to the Emergency Department complaining of sudden onset, persistent right eye problem with associated intermittent, sharp right eye pain x yesterday. Pt states she noticed "black floaters" in her right eye. She also reports sudden onset right eye blurriness, eye drainage, photophobia (sun exposure) and intermittent eye itching. Pt states she was cleaning and wrapping gifts when her symptoms began. No alleviating factors noted. She denies any head injury, any other associated symptoms and any other modifying factors at this time.     Past Medical History:  Diagnosis Date  . Allergy   . Anxiety   . Arthritis   . Bipolar 2 disorder ([place])   . Cataract   . Chest pain   . Depression   . DM (diabetes mellitus), type 2 ([place])   . GERD (gastroesophageal reflux disease)   . HTN (hypertension)   . Hypercholesteremia   . Marijuana abuse   . Trigger finger    r index inj 12/08, bilateral thumb inj 11/09, r thumb and dequervain's inj 01/2009    Patient Active Problem List   Diagnosis Date Noted  . AKI (acute kidney injury) ([place]) 10/30/2015  . Rib pain on left side 10/04/2015  . Poor appetite 10/04/2015  . Personal history of colonic polyps 09/13/2015  . Cough 06/13/2015  . Tardive dyskinesia 06/19/2014  . Cataracts, bilateral 03/21/2014  . Constipation 02/13/2014  . Chronic diastolic CHF (congestive heart failure) ([place]) 07/26/2013  . Low back pain  02/16/2013  . Dysphagia, unspecified(787.20) 09/08/2012  . Hypertension 08/12/2012  . Abnormal colonoscopy 07/17/2011  . Other bipolar disorder ([place]) 08/23/2009  . FIBROMYALGIA 08/06/2009  . Gastroparesis 03/22/2009  . Peripheral autonomic neuropathy due to DM ([place]) 01/29/2007  . DM (diabetes mellitus), type 2, uncontrolled ([place]) 08/18/2006  . Tobacco abuse 08/18/2006  . Hyperlipidemia due to type 2 diabetes mellitus ([place]) 08/13/2006  . Depression, major, recurrent ([place]) 08/13/2006    Past Surgical History:  Procedure Laterality Date  . ABDOMINAL HYSTERECTOMY  1986   fibroids  . CARDIAC CATHETERIZATION  05/06/2004   normal LV fxn EF>60%  . CYSTECTOMY     back of head  . LEFT HEART CATHETERIZATION WITH CORONARY ANGIOGRAM N/A 05/26/2011   Procedure: LEFT HEART CATHETERIZATION WITH CORONARY ANGIOGRAM;  Surgeon: Larey Dresser, MD;  Location[facility] CATH LAB;  Service: Cardiovascular;  Laterality: N/A;  radial  . perirpheral iridectomy  02/12/2005    OB History    No data available       Home Medications    Prior to Admission medications   Medication Sig Start Date End Date Taking? Authorizing Provider  aspirin 81 MG tablet Take 1 tablet (81 mg total) by mouth daily. 04/27/15   Olin Hauser, DO  atorvastatin (LIPITOR) 40 MG tablet Take 1 tablet (40 mg total) by mouth daily. 04/24/16   Izard Bing, DO  Blood Glucose Monitoring Suppl (ONE TOUCH ULTRA 2) W/DEVICE KIT 1 kit  by Does not apply route once. 04/11/15   Olin Hauser, DO  empagliflozin (JARDIANCE) 10 MG TABS tablet Take 10 mg by mouth daily. 05/02/16   [place] Bing, DO  gabapentin (NEURONTIN) 100 MG capsule Take 1 capsule (100 mg total) by mouth 4 (four) times daily. 04/24/16   [place] Bing, DO  glucose blood (ONE TOUCH ULTRA TEST) test strip Test 3 times a day. 05/15/16   [place] Bing, DO  insulin glargine (LANTUS) 100 UNIT/ML injection INJECT 30 UNIITS SUBCUTANEOUSLY ONCE DAILY 05/02/16    [place] Bing, DO  insulin lispro (HUMALOG KWIKPEN) 100 UNIT/ML KiwkPen Inject 0.05 mLs (5 Units total) into the skin 3 (three) times daily with meals. 05/02/16   [place] Bing, DO  Lancets [facility] ULTRASOFT) lancets Use as instructed 04/11/15   Olin Hauser, DO  lisinopril-hydrochlorothiazide (ZESTORETIC) 20-12.5 MG tablet Take 1 tablet by mouth daily. 05/27/16   [place] Bing, DO  metoprolol succinate (TOPROL-XL) 50 MG 24 hr tablet Take 1 tablet by mouth  daily with or immediately  following a meal 04/24/16   [place] Bing, DO  mirtazapine (REMERON) 15 MG tablet Take 1 tablet (15 mg total) by mouth at bedtime. 10/05/15   Olin Hauser, DO  ONE TOUCH LANCETS MISC 1 Device by Does not apply route 3 (three) times daily. 09/22/13   Melony Overly, MD  SEROQUEL XR 200 MG 24 hr tablet Take 1 tablet (200 mg total) by mouth at bedtime. 05/16/15   Olin Hauser, DO  tiZANidine (ZANAFLEX) 2 MG tablet Take 1 tablet (2 mg total) by mouth every 8 (eight) hours as needed for muscle spasms. 05/16/15   Olin Hauser, DO    Family History Family History  Problem Relation Age of Onset  . Diabetes Mother   . Heart disease Mother   . Heart attack Mother   . Bipolar disorder Mother   . Diabetes Father   . Bipolar disorder Sister   . Diabetes Sister   . Bipolar disorder Brother   . Diabetes Brother   . Asthma Son   . Diabetes Sister   . Diabetes Sister   . Hypertension Sister   . Diabetes Sister   . Diabetes Sister   . Diabetes Sister   . Diabetes Brother   . Diabetes Brother   . Diabetes Brother   . Asthma Son   . Colon cancer Neg Hx   . Esophageal cancer Neg Hx     Social History Social History  Substance Use Topics  . Smoking status: Current Every Day Smoker    Packs/day: 0.25    Years: 30.00    Types: Cigarettes  . Smokeless tobacco: Never Used     Comment: 3-4 cigs per day  . Alcohol use No     Allergies   Ibuprofen   Review of  Systems Review of Systems  Constitutional: Negative for chills and fever.  HENT: Negative for congestion and rhinorrhea.   Eyes: Positive for photophobia, discharge, itching and visual disturbance. Negative for redness.  Respiratory: Negative for shortness of breath and wheezing.   Cardiovascular: Negative for chest pain and palpitations.  Gastrointestinal: Negative for nausea and vomiting.  Genitourinary: Negative for dysuria and urgency.  Musculoskeletal: Negative for arthralgias and myalgias.  Skin: Negative for pallor and wound.  Neurological: Negative for dizziness and headaches.   Physical Exam Updated Vital Signs BP 130/65 (BP Location: Left Arm)   Pulse 88   Temp 98.2  F (36.8 C) (Oral)   Resp 18   Ht 5' 2"  (1.575 m)   Wt 152 lb (68.9 kg)   SpO2 98%   BMI 27.80 kg/m   Physical Exam  Constitutional: She is oriented to person, place, and time. She appears well-developed and well-nourished. No distress.  HENT:  Head: Normocephalic and atraumatic.  Eyes: EOM are normal. Pupils are equal, round, and reactive to light. Right eye exhibits no chemosis and no discharge. Left eye exhibits no chemosis and no discharge. Right conjunctiva is not injected. Left conjunctiva is not injected.  Pupils 3 mm and reactive bilaterally. No corneal injection.  Pressure R eye 13, 16 L eye  Neck: Normal range of motion. Neck supple.  Cardiovascular: Normal rate and regular rhythm.  Exam reveals no gallop and no friction rub.   No murmur heard. Pulmonary/Chest: Effort normal. She has no wheezes. She has no rales.  Abdominal: Soft. She exhibits no distension. There is no tenderness.  Musculoskeletal: She exhibits no edema or tenderness.  Neurological: She is alert and oriented to person, place, and time.  Skin: Skin is warm and dry. She is not diaphoretic.  Psychiatric: She has a normal mood and affect. Her behavior is normal.  Nursing note and vitals reviewed.   ED Treatments / Results    DIAGNOSTIC STUDIES:  Oxygen Saturation is 98% on RA, normal by my interpretation.    COORDINATION OF CARE:  5:16 PM Discussed treatment plan with pt at bedside and pt agreed to plan.  Labs (all labs ordered are listed, but only abnormal results are displayed) Labs Reviewed - No data to display  EKG  EKG Interpretation None       Radiology No results found.  Procedures Procedures (including critical care time) EMERGENCY DEPARTMENT [place] OCULAR EXAM "Study: Limited Ultrasound of Orbit "  INDICATIONS: Vision loss and Floaters/Flashes  Linear probe utilized to obtain images in both long and short axis of the orbit having the patient look left and right if possible.  PERFORMED BY: Myself  IMAGES ARCHIVED?: Yes  LIMITATIONS: none  VIEWS USED: Right orbit  INTERPRETATION: suspect PVD, unable to visualize ON.    Medications Ordered in ED Medications  fluorescein ophthalmic strip 1 strip (1 strip Both Eyes Given by Other 06/03/16 1728)  tetracaine (PONTOCAINE) 0.5 % ophthalmic solution 2 drop (2 drops Both Eyes Given by Other 06/03/16 1728)     Initial Impression / Assessment and Plan / ED Course  I have reviewed the triage vital signs and the nursing notes.  Pertinent labs & imaging results that were available during my care of the patient were reviewed by me and considered in my medical decision making (see chart for details).  Clinical Course     60 yo F With a chief complaint of floaters and then painless loss of vision in the right eye. Still feels like she can see light but is unable to make out shapes and objects. Has some pain with bright lights in that eye but denies other symptoms. bedside ultrasound concerning for posterior vitreous detachment. I discussed the case with the ophthalmologist on call Dr. Katy Fitch, will see the patient in his office this evening.   7:06 PM:  I have discussed the diagnosis/risks/treatment options with the patient and believe the pt  to be eligible for discharge home to follow-up with Optho. We also discussed returning to the ED immediately if new or worsening sx occur. We discussed the sx which are most concerning (e.g., sudden  worsening pain, fever, inability to tolerate by mouth) that necessitate immediate return. Medications administered to the patient during their visit and any new prescriptions provided to the patient are listed below.  Medications given during this visit Medications  fluorescein ophthalmic strip 1 strip (1 strip Both Eyes Given by Other 06/03/16 1728)  tetracaine (PONTOCAINE) 0.5 % ophthalmic solution 2 drop (2 drops Both Eyes Given by Other 06/03/16 1728)     The patient appears reasonably screen and/or stabilized for discharge and I doubt any other medical condition or other [facility] requiring further screening, evaluation, or treatment in the ED at this time prior to discharge.       Final Clinical Impressions(s) / ED Diagnoses   Final diagnoses:  PVD (posterior vitreous detachment), right eye    New Prescriptions Discharge Medication List as of 06/03/2016  6:15 PM     I personally performed the services described in this documentation, which was scribed in my presence. The recorded information has been reviewed and is accurate.     Deno Etienne, DO 06/03/16 1907

## 2016-06-03 NOTE — ED Notes (Signed)
Pt directed to call opthalmology directly now. Dr. Zenia Resides cell phone number provided to pt by Dr. Tyrone Nine. Pt's daughter-in-law is at bedside to drive pt and verbalizes understanding

## 2016-06-03 NOTE — ED Triage Notes (Signed)
States that yesterday she was seeing floaters out of her right eye.  States that her vision is now blurry.  States that she has cataracts but is unable to have surgery to correct them.  Pupils round, equal and reactive. Denies injury

## 2016-06-04 DIAGNOSIS — H3562 Retinal hemorrhage, left eye: Secondary | ICD-10-CM | POA: Diagnosis not present

## 2016-06-04 DIAGNOSIS — E113591 Type 2 diabetes mellitus with proliferative diabetic retinopathy without macular edema, right eye: Secondary | ICD-10-CM | POA: Diagnosis not present

## 2016-06-04 DIAGNOSIS — E113552 Type 2 diabetes mellitus with stable proliferative diabetic retinopathy, left eye: Secondary | ICD-10-CM | POA: Diagnosis not present

## 2016-06-04 DIAGNOSIS — E113521 Type 2 diabetes mellitus with proliferative diabetic retinopathy with traction retinal detachment involving the macula, right eye: Secondary | ICD-10-CM | POA: Diagnosis not present

## 2016-06-04 DIAGNOSIS — H4311 Vitreous hemorrhage, right eye: Secondary | ICD-10-CM | POA: Diagnosis not present

## 2016-06-05 ENCOUNTER — Ambulatory Visit: Payer: Medicare Other | Admitting: Family Medicine

## 2016-06-05 ENCOUNTER — Encounter: Payer: Self-pay | Admitting: Family Medicine

## 2016-06-05 NOTE — Progress Notes (Deleted)
   Subjective:   Patient ID: [name]    DOB: 30-Aug-1955, 60 y.o. female   MRN: [id number]  CC: "***"  HPI: Demaris A Sonoda is a 60 y.o. female who presents to clinic today ***. Problems discussed today are as follows:  ***: ***  ***: ***  ROS: complete ROS performed, see HPI for pertinent ROS.  [place]: HTN, HLD, GERD, DMT2, depression, bipolar type 2 disorder, anxiety, previous cardiac cath 04/2004 & 05/2011, peripheral iridectomy 01/2005, hysterectomy 1986. FHx DM, heart disease, bipolar both mother and father. Smoking status reviewed. Medications reviewed.  Objective:   There were no vitals taken for this visit. Vitals and nursing note reviewed.  General: well nourished, well developed, in no acute distress with non-toxic appearance HEENT: normocephalic, atraumatic, moist mucous membranes Neck: supple, non-tender without lymphadenopathy CV: regular rate and rhythm without murmurs, rubs, or gallops, no lower extremity edema Lungs: clear to auscultation bilaterally with normal work of breathing Abdomen: soft, non-tender, non-distended, no masses or organomegaly palpable, normoactive bowel sounds Skin: warm, dry, no rashes or lesions, cap refill < 2 seconds Extremities: warm and well perfused, normal tone  Assessment & Plan:   No problem-specific Assessment & Plan notes found for this encounter.  No orders of the defined types were placed in this encounter.  No orders of the defined types were placed in this encounter.   Harriet Butte, [place], PGY-1 06/05/2016 11:31 AM

## 2016-07-10 ENCOUNTER — Ambulatory Visit: Payer: Medicare Other | Admitting: Family Medicine

## 2016-08-20 ENCOUNTER — Other Ambulatory Visit: Payer: Self-pay | Admitting: *Deleted

## 2016-08-20 DIAGNOSIS — E1165 Type 2 diabetes mellitus with hyperglycemia: Principal | ICD-10-CM

## 2016-08-20 DIAGNOSIS — IMO0002 Reserved for concepts with insufficient information to code with codable children: Secondary | ICD-10-CM

## 2016-08-20 DIAGNOSIS — Z794 Long term (current) use of insulin: Principal | ICD-10-CM

## 2016-08-20 DIAGNOSIS — E1142 Type 2 diabetes mellitus with diabetic polyneuropathy: Secondary | ICD-10-CM

## 2016-08-20 MED ORDER — INSULIN GLARGINE 100 UNIT/ML ~~LOC~~ SOLN: SUBCUTANEOUS | 5 refills | Status: DC

## 2016-08-21 ENCOUNTER — Other Ambulatory Visit: Payer: Self-pay | Admitting: Obstetrics and Gynecology

## 2016-09-09 ENCOUNTER — Telehealth: Payer: Self-pay | Admitting: *Deleted

## 2016-09-09 NOTE — Telephone Encounter (Signed)
[facility] worker, called and stated they went out to see patient today and patient reports taking 2 tabs of lisinopril-hctz daily, meds list only has he taking 1 tab daily. Patient states she used to be on 2 tabs and feels this controls her BP better. FYI to PCP.

## 2016-09-15 ENCOUNTER — Telehealth: Payer: Self-pay | Admitting: Family Medicine

## 2016-09-15 NOTE — Telephone Encounter (Signed)
Called pt and left voicemail with instructions to take once per day. Also explained how this can be detrimental to renal function and would not improve BP. -- Harriet Butte, DO [place], PGY-1

## 2016-09-15 NOTE — Telephone Encounter (Signed)
Called patient concerning use of lisinopril-HCTZ twice daily with prescription for once daily. Gave instructions stating how this can hurt her kidneys and would not improve blood pressure. Pt to call clinic if she has any further questions. -- Harriet Butte, [place], PGY-1

## 2016-10-02 ENCOUNTER — Other Ambulatory Visit: Payer: Self-pay | Admitting: *Deleted

## 2016-10-02 DIAGNOSIS — G6289 Other specified polyneuropathies: Secondary | ICD-10-CM

## 2016-10-02 DIAGNOSIS — F3189 Other bipolar disorder: Secondary | ICD-10-CM

## 2016-10-02 DIAGNOSIS — F3341 Major depressive disorder, recurrent, in partial remission: Secondary | ICD-10-CM

## 2016-10-02 MED ORDER — MIRTAZAPINE 15 MG PO TABS: 15.0000 mg | ORAL_TABLET | Freq: Every day | ORAL | 2 refills | Status: DC

## 2016-10-02 MED ORDER — GABAPENTIN 100 MG PO CAPS: 100.0000 mg | ORAL_CAPSULE | Freq: Four times a day (QID) | ORAL | 5 refills | Status: DC

## 2016-11-04 DIAGNOSIS — I10 Essential (primary) hypertension: Secondary | ICD-10-CM | POA: Diagnosis not present

## 2016-11-04 DIAGNOSIS — E785 Hyperlipidemia, unspecified: Secondary | ICD-10-CM | POA: Diagnosis not present

## 2016-11-04 DIAGNOSIS — R69 Illness, unspecified: Secondary | ICD-10-CM | POA: Diagnosis not present

## 2016-11-04 DIAGNOSIS — Z0000 Encounter for general adult medical examination without abnormal findings: Secondary | ICD-10-CM | POA: Diagnosis not present

## 2016-11-04 DIAGNOSIS — E1142 Type 2 diabetes mellitus with diabetic polyneuropathy: Secondary | ICD-10-CM | POA: Diagnosis not present

## 2016-11-04 DIAGNOSIS — M62831 Muscle spasm of calf: Secondary | ICD-10-CM | POA: Diagnosis not present

## 2016-11-04 DIAGNOSIS — Z7982 Long term (current) use of aspirin: Secondary | ICD-10-CM | POA: Diagnosis not present

## 2016-11-04 DIAGNOSIS — G4700 Insomnia, unspecified: Secondary | ICD-10-CM | POA: Diagnosis not present

## 2016-11-04 DIAGNOSIS — K219 Gastro-esophageal reflux disease without esophagitis: Secondary | ICD-10-CM | POA: Diagnosis not present

## 2016-11-04 DIAGNOSIS — Z79899 Other long term (current) drug therapy: Secondary | ICD-10-CM | POA: Diagnosis not present

## 2016-11-04 DIAGNOSIS — M545 Low back pain: Secondary | ICD-10-CM | POA: Diagnosis not present

## 2016-11-17 ENCOUNTER — Emergency Department (HOSPITAL_BASED_OUTPATIENT_CLINIC_OR_DEPARTMENT_OTHER): Payer: Medicare HMO

## 2016-11-17 ENCOUNTER — Emergency Department (HOSPITAL_BASED_OUTPATIENT_CLINIC_OR_DEPARTMENT_OTHER)
Admission: EM | Admit: 2016-11-17 | Discharge: 2016-11-17 | Disposition: A | Discharge status: Home / Self Care | Payer: Medicare HMO | Attending: Emergency Medicine | Admitting: Emergency Medicine

## 2016-11-17 ENCOUNTER — Encounter (HOSPITAL_BASED_OUTPATIENT_CLINIC_OR_DEPARTMENT_OTHER): Payer: Self-pay | Admitting: Emergency Medicine

## 2016-11-17 DIAGNOSIS — M533 Sacrococcygeal disorders, not elsewhere classified: Secondary | ICD-10-CM | POA: Diagnosis not present

## 2016-11-17 DIAGNOSIS — Y929 Unspecified place or not applicable: Secondary | ICD-10-CM | POA: Insufficient documentation

## 2016-11-17 DIAGNOSIS — Y939 Activity, unspecified: Secondary | ICD-10-CM | POA: Insufficient documentation

## 2016-11-17 DIAGNOSIS — W19XXXA Unspecified fall, initial encounter: Secondary | ICD-10-CM

## 2016-11-17 DIAGNOSIS — I5032 Chronic diastolic (congestive) heart failure: Secondary | ICD-10-CM | POA: Diagnosis not present

## 2016-11-17 DIAGNOSIS — S3992XA Unspecified injury of lower back, initial encounter: Secondary | ICD-10-CM | POA: Diagnosis present

## 2016-11-17 DIAGNOSIS — W010XXA Fall on same level from slipping, tripping and stumbling without subsequent striking against object, initial encounter: Secondary | ICD-10-CM | POA: Diagnosis not present

## 2016-11-17 DIAGNOSIS — R69 Illness, unspecified: Secondary | ICD-10-CM | POA: Diagnosis not present

## 2016-11-17 DIAGNOSIS — S300XXA Contusion of lower back and pelvis, initial encounter: Secondary | ICD-10-CM | POA: Diagnosis not present

## 2016-11-17 DIAGNOSIS — F17210 Nicotine dependence, cigarettes, uncomplicated: Secondary | ICD-10-CM | POA: Diagnosis not present

## 2016-11-17 DIAGNOSIS — Z043 Encounter for examination and observation following other accident: Secondary | ICD-10-CM | POA: Diagnosis not present

## 2016-11-17 DIAGNOSIS — Z794 Long term (current) use of insulin: Secondary | ICD-10-CM | POA: Diagnosis not present

## 2016-11-17 DIAGNOSIS — Y999 Unspecified external cause status: Secondary | ICD-10-CM | POA: Diagnosis not present

## 2016-11-17 DIAGNOSIS — I110 Hypertensive heart disease with heart failure: Secondary | ICD-10-CM | POA: Diagnosis not present

## 2016-11-17 DIAGNOSIS — E119 Type 2 diabetes mellitus without complications: Secondary | ICD-10-CM | POA: Diagnosis not present

## 2016-11-17 DIAGNOSIS — Z7982 Long term (current) use of aspirin: Secondary | ICD-10-CM | POA: Insufficient documentation

## 2016-11-17 DIAGNOSIS — M545 Low back pain: Secondary | ICD-10-CM | POA: Diagnosis not present

## 2016-11-17 MED ORDER — CYCLOBENZAPRINE HCL 10 MG PO TABS: 10.0000 mg | ORAL_TABLET | Freq: Two times a day (BID) | ORAL | 0 refills | Status: DC | PRN

## 2016-11-17 MED ORDER — OXYCODONE HCL 5 MG PO TABS
5.0000 mg | ORAL_TABLET | Freq: Once | ORAL | Status: AC
  Administered 2016-11-17: 5 mg via ORAL
  Filled 2016-11-17: qty 1

## 2016-11-17 MED ORDER — ACETAMINOPHEN 500 MG PO TABS
1000.0000 mg | ORAL_TABLET | Freq: Once | ORAL | Status: AC
  Administered 2016-11-17: 1000 mg via ORAL
  Filled 2016-11-17: qty 2

## 2016-11-17 MED FILL — CYCLOBENZAPRINE 10 MG TAB: 10 | 10 days supply | Qty: 20 | Fill #0

## 2016-11-17 NOTE — ED Notes (Signed)
Patient ambulatory to and from the restroom.

## 2016-11-17 NOTE — ED Notes (Signed)
Patient transported to X-ray 

## 2016-11-17 NOTE — Discharge Instructions (Signed)
Take tylenol 1000mg  four times daily for one week for pain in addition to muscle relaxant that was prescribed.

## 2016-11-17 NOTE — ED Triage Notes (Signed)
Patient states that she fell last night. Reports that she is having lower back pain

## 2016-11-17 NOTE — ED Provider Notes (Signed)
[facility] DEPT [facility] Provider Note   CSN: [id number] Arrival date & time: 11/17/16  1055     History   Chief Complaint Chief Complaint  Patient presents with  . Fall    HPI [name] is a 61 y.o. female.  HPI  Had mopped the floor, and last night walked back into room and slipped on area, falling right on her tailbone.  Husband helped pick her up.  Pain with moving, bending. Can't sit on bottom because of pain in tailbone and bilateral buttock.  No numbness/weakness on one side. No loss of control of bowel or bladder.  Passing flatus and feel pressure like need to go.  No head trauma, no LOC.  No other hip, back, or neck pain.  Pain 11/10. Has not taken anything yet for pain. Allergic to ibuprofen.  Past Medical History:  Diagnosis Date  . Allergy   . Anxiety   . Arthritis   . Bipolar 2 disorder ([place])   . Cataract   . Chest pain   . Depression   . DM (diabetes mellitus), type 2 ([place])   . GERD (gastroesophageal reflux disease)   . HTN (hypertension)   . Hypercholesteremia   . Marijuana abuse   . Trigger finger    r index inj 12/08, bilateral thumb inj 11/09, r thumb and dequervain's inj 01/2009    Patient Active Problem List   Diagnosis Date Noted  . AKI (acute kidney injury) ([place]) 10/30/2015  . Rib pain on left side 10/04/2015  . Poor appetite 10/04/2015  . Personal history of colonic polyps 09/13/2015  . Cough 06/13/2015  . Tardive dyskinesia 06/19/2014  . Cataracts, bilateral 03/21/2014  . Constipation 02/13/2014  . Chronic diastolic CHF (congestive heart failure) ([place]) 07/26/2013  . Low back pain 02/16/2013  . Dysphagia, unspecified(787.20) 09/08/2012  . Hypertension 08/12/2012  . Abnormal colonoscopy 07/17/2011  . Other bipolar disorder ([place]) 08/23/2009  . FIBROMYALGIA 08/06/2009  . Gastroparesis 03/22/2009  . Peripheral autonomic neuropathy due to DM ([place]) 01/29/2007  . DM (diabetes mellitus), type 2, uncontrolled ([place]) 08/18/2006  . Tobacco  abuse 08/18/2006  . Hyperlipidemia due to type 2 diabetes mellitus ([place]) 08/13/2006  . Depression, major, recurrent ([place]) 08/13/2006    Past Surgical History:  Procedure Laterality Date  . ABDOMINAL HYSTERECTOMY  1986   fibroids  . CARDIAC CATHETERIZATION  05/06/2004   normal LV fxn EF>60%  . CYSTECTOMY     back of head  . LEFT HEART CATHETERIZATION WITH CORONARY ANGIOGRAM N/A 05/26/2011   Procedure: LEFT HEART CATHETERIZATION WITH CORONARY ANGIOGRAM;  Surgeon: Larey Dresser, MD;  Location: [facility] CATH LAB;  Service: Cardiovascular;  Laterality: N/A;  radial  . perirpheral iridectomy  02/12/2005    OB History    No data available       Home Medications    Prior to Admission medications   Medication Sig Start Date End Date Taking? Authorizing Provider  aspirin 81 MG tablet Take 1 tablet (81 mg total) by mouth daily. 04/27/15   Karamalegos, Devonne Doughty, DO  atorvastatin (LIPITOR) 40 MG tablet Take 1 tablet (40 mg total) by mouth daily. 04/24/16   [place] Bing, DO  Blood Glucose Monitoring Suppl (ONE TOUCH ULTRA 2) W/DEVICE KIT 1 kit by Does not apply route once. 04/11/15   Karamalegos, Devonne Doughty, DO  cyclobenzaprine (FLEXERIL) 10 MG tablet Take 1 tablet (10 mg total) by mouth 2 (two) times daily as needed for muscle spasms. 11/17/16   Gareth Morgan,  MD  empagliflozin (JARDIANCE) 10 MG TABS tablet Take 10 mg by mouth daily. 05/02/16   [place] Bing, DO  gabapentin (NEURONTIN) 100 MG capsule Take 1 capsule (100 mg total) by mouth 4 (four) times daily. 10/02/16   [place] Bing, DO  glucose blood (ONE TOUCH ULTRA TEST) test strip Test 3 times a day. 05/15/16   [place] Bing, DO  insulin glargine (LANTUS) 100 UNIT/ML injection INJECT 30 UNIITS SUBCUTANEOUSLY ONCE DAILY 08/20/16   [place] Bing, DO  insulin lispro (HUMALOG KWIKPEN) 100 UNIT/ML KiwkPen Inject 0.05 mLs (5 Units total) into the skin 3 (three) times daily with meals. 05/02/16   [place] Bing, DO  Lancets  [facility] ULTRASOFT) lancets Use as instructed 04/11/15   Olin Hauser, DO  lisinopril-hydrochlorothiazide (ZESTORETIC) 20-12.5 MG tablet Take 1 tablet by mouth daily. 05/27/16   [place] Bing, DO  metoprolol succinate (TOPROL-XL) 50 MG 24 hr tablet Take 1 tablet by mouth  daily with or immediately  following a meal 04/24/16   [place] Bing, DO  mirtazapine (REMERON) 15 MG tablet Take 1 tablet (15 mg total) by mouth at bedtime. 10/02/16   [place] Bing, DO  ONE TOUCH LANCETS MISC 1 Device by Does not apply route 3 (three) times daily. 09/22/13   Melony Overly, MD  SEROQUEL XR 200 MG 24 hr tablet Take 1 tablet (200 mg total) by mouth at bedtime. 05/16/15   Karamalegos, Devonne Doughty, DO  tiZANidine (ZANAFLEX) 2 MG tablet Take 1 tablet (2 mg total) by mouth every 8 (eight) hours as needed for muscle spasms. 05/16/15   Olin Hauser, DO    Family History Family History  Problem Relation Age of Onset  . Diabetes Mother   . Heart disease Mother   . Heart attack Mother   . Bipolar disorder Mother   . Diabetes Father   . Bipolar disorder Sister   . Diabetes Sister   . Bipolar disorder Brother   . Diabetes Brother   . Asthma Son   . Diabetes Sister   . Diabetes Sister   . Hypertension Sister   . Diabetes Sister   . Diabetes Sister   . Diabetes Sister   . Diabetes Brother   . Diabetes Brother   . Diabetes Brother   . Asthma Son   . Colon cancer Neg Hx   . Esophageal cancer Neg Hx     Social History Social History  Substance Use Topics  . Smoking status: Current Every Day Smoker    Packs/day: 0.25    Years: 30.00    Types: Cigarettes  . Smokeless tobacco: Never Used     Comment: 3-4 cigs per day  . Alcohol use No     Allergies   Ibuprofen   Review of Systems Review of Systems  Constitutional: Negative for fever.  HENT: Negative for sore throat.   Eyes: Negative for visual disturbance.  Respiratory: Negative for cough and shortness of  breath.   Cardiovascular: Negative for chest pain.  Gastrointestinal: Negative for abdominal pain.  Genitourinary: Negative for difficulty urinating.  Musculoskeletal: Positive for back pain (very low back/tailbone), gait problem and myalgias. Negative for neck pain.  Skin: Negative for rash.  Neurological: Negative for syncope and headaches.     Physical Exam Updated Vital Signs BP (!) 165/73 (BP Location: Right Arm)   Pulse 81   Temp 97.9 F (36.6 C) (Oral)   Resp 18   Ht 5' 2"  (1.575 m)  Wt 68.9 kg (152 lb)   SpO2 100%   BMI 27.80 kg/m   Physical Exam  Constitutional: She is oriented to person, place, and time. She appears well-developed and well-nourished. No distress.  HENT:  Head: Normocephalic and atraumatic.  Eyes: Conjunctivae and EOM are normal.  Neck: Normal range of motion.  Cardiovascular: Normal rate, regular rhythm, normal heart sounds and intact distal pulses.  Exam reveals no gallop and no friction rub.   No murmur heard. Pulmonary/Chest: Effort normal and breath sounds normal. No respiratory distress. She has no wheezes. She has no rales.  Abdominal: Soft. She exhibits no distension. There is no tenderness. There is no guarding.  Musculoskeletal: She exhibits no edema.       Lumbar back: She exhibits tenderness (bilateral buttocks) and bony tenderness (lower lumbar, sacral, coccyx pain).  Neurological: She is alert and oriented to person, place, and time. She has normal strength. No sensory deficit.  Skin: Skin is warm and dry. No rash noted. She is not diaphoretic. No erythema.  Nursing note and vitals reviewed.    ED Treatments / Results  Labs (all labs ordered are listed, but only abnormal results are displayed) Labs Reviewed - No data to display  EKG  EKG Interpretation None       Radiology Dg Lumbar Spine Complete  Result Date: 11/17/2016 CLINICAL DATA:  Lumbago after fall EXAM: LUMBAR SPINE - COMPLETE 4+ VIEW COMPARISON:  None. FINDINGS:  Frontal, lateral, spot lumbosacral lateral, and bilateral oblique views were obtained. There are 5 non-rib-bearing lumbar type vertebral bodies. There is no fracture or spondylolisthesis. Disc spaces appear unremarkable. There is mild facet osteoarthritic change at L4-5 and L5-S1 bilaterally. There is aortic atherosclerosis. IMPRESSION: Mild lower lumbar facet osteoarthritic change. No appreciable disc space narrowing. No fracture or spondylolisthesis. There is aortic atherosclerosis. Electronically Signed   By: Lowella Grip III M.D.   On: 11/17/2016 11:45   Dg Sacrum/coccyx  Result Date: 11/17/2016 CLINICAL DATA:  Pain following fall EXAM: SACRUM AND COCCYX - 2+ VIEW COMPARISON:  None. FINDINGS: Overall, tilt frontal, and lateral views were obtained. There is no fracture or diastases. Joint spaces appear normal. There is aortoiliac atherosclerosis. IMPRESSION: Fracture or diastases. No sacroiliitis. There is aortoiliac atherosclerosis. Electronically Signed   By: Lowella Grip III M.D.   On: 11/17/2016 11:45    Procedures Procedures (including critical care time)  Medications Ordered in ED Medications  acetaminophen (TYLENOL) tablet 1,000 mg (1,000 mg Oral Given 11/17/16 1143)  oxyCODONE (Oxy IR/ROXICODONE) immediate release tablet 5 mg (5 mg Oral Given 11/17/16 1143)     Initial Impression / Assessment and Plan / ED Course  I have reviewed the triage vital signs and the nursing notes.  Pertinent labs & imaging results that were available during my care of the patient were reviewed by me and considered in my medical decision making (see chart for details).     61 year old feet female with a history of bipolar, hypertension, hyperlipidemia presents with concern for severe tailbone pain after a fall last night.  Denies head trauma, use of blood thinners, neck pain, numbness or weakness. Normal strength on exam.  Tenderness sacrum, coccyx. Given tylenol, oxycodone in ED. XR shows no acute  fracture. Patient with likely muscular pain. Given rx for flexeril, recommend tylenol for pain. Patient discharged in stable condition with understanding of reasons to return.    Final Clinical Impressions(s) / ED Diagnoses   Final diagnoses:  Fall, initial encounter  Contusion of coccyx,  initial encounter    New Prescriptions Discharge Medication List as of 11/17/2016 11:55 AM    START taking these medications   Details  cyclobenzaprine (FLEXERIL) 10 MG tablet Take 1 tablet (10 mg total) by mouth 2 (two) times daily as needed for muscle spasms., Starting Mon 11/17/2016, Print         Gareth Morgan, MD 11/17/16 [phone]

## 2016-12-04 ENCOUNTER — Ambulatory Visit (INDEPENDENT_AMBULATORY_CARE_PROVIDER_SITE_OTHER): Payer: Medicare HMO | Admitting: Family Medicine

## 2016-12-04 ENCOUNTER — Encounter: Payer: Self-pay | Admitting: Family Medicine

## 2016-12-04 VITALS — BP 136/72 | HR 94 | Temp 98.4°F | Wt 149.0 lb

## 2016-12-04 DIAGNOSIS — F17200 Nicotine dependence, unspecified, uncomplicated: Secondary | ICD-10-CM | POA: Diagnosis not present

## 2016-12-04 DIAGNOSIS — I10 Essential (primary) hypertension: Secondary | ICD-10-CM

## 2016-12-04 DIAGNOSIS — E1100 Type 2 diabetes mellitus with hyperosmolarity without nonketotic hyperglycemic-hyperosmolar coma (NKHHC): Secondary | ICD-10-CM | POA: Diagnosis not present

## 2016-12-04 DIAGNOSIS — R69 Illness, unspecified: Secondary | ICD-10-CM | POA: Diagnosis not present

## 2016-12-04 LAB — POCT GLYCOSYLATED HEMOGLOBIN (HGB A1C): Hemoglobin A1C: 11.4

## 2016-12-04 MED ORDER — EMPAGLIFLOZIN 10 MG PO TABS: 10.0000 mg | ORAL_TABLET | Freq: Every day | ORAL | 0 refills | Status: DC

## 2016-12-04 NOTE — Progress Notes (Signed)
   Subjective:   Patient ID: [name]    DOB: [birth year], 61 y.o. female   MRN: [id number]  CC: "Diabetes"  HPI: [name] is a 61 y.o. female who presents to clinic today for diabetes. Problems discussed today are as follows:  Diabetes: Glucose level average remains 200-300s with highest 400. Patient states sugar levels were as low as 30 while taking Humalog for 1 week after last visit. Patient discontinued this with improvement of low-dose to 90. Patient is concerned about dropping her sugar levels given that her eating is erratic. She usually eats one meal daily which may be breakfast or dinner and does not snack regularly. She also drinks water and occasionally sweet tea. Patient is unsure about jardiance despite this being in her med list but interested in starting. ROS: Denies change in vision, nausea or vomiting, gait instability.  High blood pressure: Patient takes her lisinopril-HCTZ regularly. Does not check blood pressure regularly. Does not have a formal exercise regimen. Continues to smoke daily.  Smoking: Every day smoker with 1/2 packs per day for 43 years. Currently interested in stopping. Has made prior attempts at use nicotine.  ROS: Denies chest pain, shortness of breath, cough, weight loss.  Complete ROS performed, see HPI for pertinent.  [place]: IDDM with HLD and gastroparesis, HTN, tardive dyskinesia, fibromyalgia, bipolar disorder, tobacco use disorder, major recurrent depression. H/o abdominal hysterectomy, peripheral iridectomy. Mother h/o DM, MI, bipolar, father h/o DM, sister h/o bipolar. Smoking status reviewed. Medications reviewed.  Objective:   BP 136/72   Pulse 94   Temp 98.4 F (36.9 C) (Oral)   Wt 149 lb (67.6 kg)   SpO2 99%   BMI 27.25 kg/m  Vitals and nursing note reviewed.  General: appears older than stated age, well nourished, well developed, in no acute distress with non-toxic appearance HEENT: normocephalic, atraumatic, moist  mucous membranes Neck: supple, non-tender without lymphadenopathy CV: regular rate and rhythm without murmurs, rubs, or gallops, no lower extremity edema Lungs: clear to auscultation bilaterally with normal work of breathing Abdomen: soft, non-tender, non-distended, no masses or organomegaly palpable, normoactive bowel sounds Skin: warm, dry, no rashes or lesions, cap refill < 2 seconds Extremities: warm and well perfused, normal tone, see diabetic foot exam for details  Assessment & Plan:   DM (diabetes mellitus), type 2, uncontrolled (HCC) Chronic. Uncontrolled. A1c 11.4. Only on Lantus 30 units daily. Unsuccessful with using bolus insulin. Brittle diabetic and infrequent eating. --Patient to continue Lantus 30 units daily and discontinue Humalog to prevent hypoglycemia given her poor diet --Will initiate jardiance 10 mg daily --Patient to follow-up with Dr. Valentina Lucks for further diabetes recommendations  Primary hypertension Chronic. Controlled. Currently on lisinopril-HCTZ and Toprol-XL. Every day smoker. --Discussed smoking cessation --Continue lisinopril-HCTZ 20-12.5 mg daily, and Toprol-XL 50 mg daily  Tobacco use disorder Chronic daily smoker. Significant pack year history >20 years currently at 1/2 packs per day. Interested in cessation. --Given contact information with 1-800-quit-now --Patient to follow-up with Dr. Valentina Lucks for smoking cessation  Orders Placed This Encounter  Procedures  . HgB A1c   Meds ordered this encounter  Medications  . empagliflozin (JARDIANCE) 10 MG TABS tablet    Sig: Take 10 mg by mouth daily.    Dispense:  30 tablet    Refill:  0    Harriet Butte, [place], PGY-1 12/04/2016 3:16 PM

## 2016-12-04 NOTE — Assessment & Plan Note (Addendum)
Chronic. Controlled. Currently on lisinopril-HCTZ and Toprol-XL. Every day smoker. --Discussed smoking cessation --Continue lisinopril-HCTZ 20-12.5 mg daily, and Toprol-XL 50 mg daily

## 2016-12-04 NOTE — Patient Instructions (Signed)
Thank you for coming in to see [place] today. Please see below to review our plan for today's visit.  1. Your diabetes is not controlled. We will need to add a new medication called Jardiance to your Lantus. I have attached information below. This medication should not drop your blood sugar levels to hypoglycemic levels. Take 10 mg daily as instructed. This medication works by helping you urinate sugar to drop your glucose levels so expect increasing urination. 2. I need you to follow-up with Dr. Valentina Lucks as soon as possible to discuss other means to control your diabetes. You can also discuss smoking cessation during this visit. In the meantime call 1-800-quit-now for free resources. 3. Return to the clinic in 2 weeks for reassessment of her diabetes.  Please call the clinic at [phone] if your symptoms worsen or you have any concerns. It was my pleasure to see you. -- Harriet Butte, [place], PGY-1  Empagliflozin oral tablets What is this medicine? EMPAGLIFLOZIN (EM pa gli FLOE zin) helps to treat type 2 diabetes. It helps to control blood sugar. This drug may also reduce the risk of heart attack or stroke if you have type 2 diabetes and risk factors for heart disease. Treatment is combined with diet and exercise. This medicine may be used for other purposes; ask your health care provider or pharmacist if you have questions. COMMON BRAND NAME(S): JARDIANCE What should I tell my health care provider before I take this medicine? They need to know if you have any of these conditions: -dehydration -diabetic ketoacidosis -diet low in salt -eating less due to illness, surgery, dieting, or any other reason -having surgery -high cholesterol -high levels of potassium in the blood -history of pancreatitis or pancreas problems -history of yeast infection of the penis or vagina -if you often drink alcohol -infections in the bladder, kidneys, or urinary tract -kidney  disease -liver disease -low blood pressure -on hemodialysis -problems urinating -type 1 diabetes -uncircumcised female -an unusual or allergic reaction to empagliflozin, other medicines, foods, dyes, or preservatives -pregnant or trying to get pregnant -breast-feeding How should I use this medicine? Take this medicine by mouth with a glass of water. Follow the directions on the prescription label. Take it in the morning, with or without food. Take your dose at the same time each day. Do not take more often than directed. Do not stop taking except on your doctor's advice. Talk to your pediatrician regarding the use of this medicine in children. Special care may be needed. Overdosage: If you think you have taken too much of this medicine contact a poison control center or emergency room at once. NOTE: This medicine is only for you. Do not share this medicine with others. What if I miss a dose? If you miss a dose, take it as soon as you can. If it is almost time for your next dose, take only that dose. Do not take double or extra doses. What may interact with this medicine? Do not take this medicine with any of the following medications: -gatifloxacin This medicine may also interact with the following medications: -alcohol -certain medicines for blood pressure, heart disease -diuretics This list may not describe all possible interactions. Give your health care provider a list of all the medicines, herbs, non-prescription drugs, or dietary supplements you use. Also tell them if you smoke, drink alcohol, or use illegal drugs. Some items may interact with your medicine. What should I watch for while using this medicine?  Visit your doctor or health care professional for regular checks on your progress. This medicine can cause a serious condition in which there is too much acid in the blood. If you develop nausea, vomiting, stomach pain, unusual tiredness, or breathing problems, stop taking this  medicine and call your doctor right away. If possible, use a ketone dipstick to check for ketones in your urine. A test called the HbA1C (A1C) will be monitored. This is a simple blood test. It measures your blood sugar control over the last 2 to 3 months. You will receive this test every 3 to 6 months. Learn how to check your blood sugar. Learn the symptoms of low and high blood sugar and how to manage them. Always carry a quick-source of sugar with you in case you have symptoms of low blood sugar. Examples include hard sugar candy or glucose tablets. Make sure others know that you can choke if you eat or drink when you develop serious symptoms of low blood sugar, such as seizures or unconsciousness. They must get medical help at once. Tell your doctor or health care professional if you have high blood sugar. You might need to change the dose of your medicine. If you are sick or exercising more than usual, you might need to change the dose of your medicine. Do not skip meals. Ask your doctor or health care professional if you should avoid alcohol. Many nonprescription cough and cold products contain sugar or alcohol. These can affect blood sugar. Wear a medical ID bracelet or chain, and carry a card that describes your disease and details of your medicine and dosage times. What side effects may I notice from receiving this medicine? Side effects that you should report to your doctor or health care professional as soon as possible: -allergic reactions like skin rash, itching or hives, swelling of the face, lips, or tongue -breathing problems -dizziness -fast or irregular heartbeat -feeling faint or lightheaded, falls -muscle weakness -nausea, vomiting, unusual stomach upset or pain -signs and symptoms of low blood sugar such as feeling anxious, confusion, dizziness, increased hunger, unusually weak or tired, sweating, shakiness, cold, irritable, headache, blurred vision, fast heartbeat, loss of  consciousness -signs and symptoms of a urinary tract infection, such as fever, chills, a burning feeling when urinating, blood in the urine, back pain -trouble passing urine or change in the amount of urine, including an urgent need to urinate more often, in larger amounts, or at night -penile discharge, itching, or pain in men -unusual tiredness -vaginal discharge, itching, or odor in women Side effects that usually do not require medical attention (report to your doctor or health care professional if they continue or are bothersome): -joint pain -mild increase in urination -thirsty This list may not describe all possible side effects. Call your doctor for medical advice about side effects. You may report side effects to FDA at 1-800-FDA-1088. Where should I keep my medicine? Keep out of the reach of children. Store at room temperature between 20 and 25 degrees C (68 and 77 degrees F). Throw away any unused medicine after the expiration date. NOTE: This sheet is a summary. It may not cover all possible information. If you have questions about this medicine, talk to your doctor, pharmacist, or health care provider.  2018 Elsevier/Gold Standard (2015-07-05 11:46:10)

## 2016-12-04 NOTE — Assessment & Plan Note (Addendum)
Chronic. Uncontrolled. A1c 11.4. Only on Lantus 30 units daily. Unsuccessful with using bolus insulin. Brittle diabetic and infrequent eating. --Patient to continue Lantus 30 units daily and discontinue Humalog to prevent hypoglycemia given her poor diet --Will initiate jardiance 10 mg daily --Patient to follow-up with Dr. Valentina Lucks for further diabetes recommendations

## 2016-12-04 NOTE — Assessment & Plan Note (Addendum)
Chronic daily smoker. Significant pack year history >20 years currently at 1/2 packs per day. Interested in cessation. --Given contact information with 1-800-quit-now --Patient to follow-up with Dr. Valentina Lucks for smoking cessation

## 2016-12-09 ENCOUNTER — Telehealth: Payer: Self-pay | Admitting: Family Medicine

## 2016-12-09 NOTE — Telephone Encounter (Signed)
Pt says her new pharmacy is [facility].  [phone]

## 2016-12-10 NOTE — Telephone Encounter (Signed)
Noted. Pharmacy changed in epic.

## 2016-12-11 ENCOUNTER — Ambulatory Visit (INDEPENDENT_AMBULATORY_CARE_PROVIDER_SITE_OTHER): Payer: Medicare HMO | Admitting: Pharmacist

## 2016-12-11 ENCOUNTER — Encounter: Payer: Self-pay | Admitting: Licensed Clinical Social Worker

## 2016-12-11 ENCOUNTER — Encounter: Payer: Self-pay | Admitting: Pharmacist

## 2016-12-11 DIAGNOSIS — F339 Major depressive disorder, recurrent, unspecified: Secondary | ICD-10-CM | POA: Diagnosis not present

## 2016-12-11 DIAGNOSIS — F17200 Nicotine dependence, unspecified, uncomplicated: Secondary | ICD-10-CM

## 2016-12-11 DIAGNOSIS — E1100 Type 2 diabetes mellitus with hyperosmolarity without nonketotic hyperglycemic-hyperosmolar coma (NKHHC): Secondary | ICD-10-CM

## 2016-12-11 DIAGNOSIS — R69 Illness, unspecified: Secondary | ICD-10-CM | POA: Diagnosis not present

## 2016-12-11 MED ORDER — EMPAGLIFLOZIN 10 MG PO TABS: 10.0000 mg | ORAL_TABLET | Freq: Every day | ORAL | 2 refills | Status: DC

## 2016-12-11 MED ORDER — RANITIDINE HCL 150 MG PO TABS: 300.0000 mg | ORAL_TABLET | Freq: Two times a day (BID) | ORAL | 0 refills | Status: DC

## 2016-12-11 NOTE — Assessment & Plan Note (Signed)
Diabetes longstanding diagnosed currently uncontrolled. Patient reports 1 hypoglycemic event and is able to verbalize appropriate hypoglycemia management plan. Patient reports adherence with medication. Control is suboptimal due to history of hypoglycemia, depression, and lack of regular food intake/poor appetite. Reluctant to utilize rapid acting insulin as she has experienced severe hypoglycemia in the past.  -Started Jardiance (empagliflozin) 10 mg once daily. -Continued Lantus 30 units once daily  -Next A1C anticipated 3 months.

## 2016-12-11 NOTE — Patient Instructions (Addendum)
Thanks for coming to see [place] today! Here are the medication changes we discussed today:  1. Take your Flexeril 10 mg every night to help you sleep at night. 2. Start taking your Jardiance 10 mg once daily for your diabetes. We have sent this to the [facility] pharmacy. 3. Continue your Lantus 30 unitsonce daily. 4. Take your Zantac 150 mg from 3 tablets daily to 2 tablets (300 mg) twice daily   Please come back to see [place] in X weeks.

## 2016-12-11 NOTE — Progress Notes (Signed)
S:    Chief Complaint  Patient presents with  . Medication Management    diabetes and smoking cessation   Patient arrives ambulating well with granddaughter and husband.  Patient has an irritable attitude today.  Presents for diabetes evaluation, education, and management, as well as smoking cessation at the request of Dr. Yisroel Ramming. Patient was referred on 12/04/2016.  Patient was last seen by Primary Care Provider on 12/04/2016.   Depression/Bipolar Patient presents somewhat argumentative and irritable today.  Reports not eating and only gets ~5 hrs/sleep per night - has tried [place] and trazodone. She is supposed to be managed by mental health; however has not seen them so [facility] has been represcribing. When taking Remeron she can't function if she takes it scheduled and experiences sleepiness the entire day.  She reports not Seroquel because she feels like a zombie and has not taken this in a few years.  She denies thoughts of hurting herself or others.    Diabetes  Patient reports adherence with some medications. Current diabetes medications include: Lantus 30 units daily, Jardiance - not taking sent to wrong pharmacy so not yet filled, Humalog - not taking 2/2 symptomatic lows and patient fearful of ever taking this again.  Diabetes medications tried in the past: Metformin - trouble breathing/chest pain.  Current hypertension medications include: Lisinopril-HCTZ 20-12.5mg   Patient reports hypoglycemic events. CBG 48 from not eating. Has not been eating much lately.   Patient reported dietary habits: Eats 0-1 meals/day Breakfast: BLT (no sides) with coffee Lunch: Nothing Dinner: Nothing Snacks: Cup of olives, pig feet, sauerkraut Patient denies neuropathy.  Tobacco Abuse.  Currently smoking 1/2 - 3/4 ppd of cheapest menthol brand. Had nicotine patch 14mg  in hospital that helps. Her lowest cigarette use was 6 cig/day a few years ago.    GERD Currently taking OTC Zantac 450 mg  daily. She reports coffee, water, juice and other foods often exacerbate her GERD. She reports symptoms even on this dose.   O:  Physical Exam  Constitutional: She appears well-developed.  Vitals reviewed.  Review of Systems  Psychiatric/Behavioral: Positive for depression. Negative for suicidal ideas. The patient has insomnia.   All other systems reviewed and are negative.  Lab Results  Component Value Date   HGBA1C 11.4 12/04/2016   Lipid Panel     Component Value Date/Time   CHOL 189 10/04/2015 1706   TRIG 108 10/04/2015 1706   HDL 50 10/04/2015 1706   CHOLHDL 3.8 10/04/2015 1706   VLDL 22 10/04/2015 1706   LDLCALC 117 10/04/2015 1706   LDLDIRECT 101 (H) 05/31/2013 1421   Vitals:   12/11/16 1604  BP: (!) 148/76  Pulse: 90   A/P: Diabetes longstanding diagnosed currently uncontrolled. Patient reports 1 hypoglycemic event and is able to verbalize appropriate hypoglycemia management plan. Patient reports adherence with medication. Control is suboptimal due to history of hypoglycemia, depression, and lack of regular food intake/poor appetite. Reluctant to utilize rapid acting insulin as she has experienced severe hypoglycemia in the past.  -Started Jardiance (empagliflozin) 10 mg once daily. -Continued Lantus 30 units once daily  -Next A1C anticipated 3 months.    GERD currently uncontrolled -Increased Ranitidine to 300 mg BID. -Consider addition of PPI if GERD remains uncontrolled.   ASCVD risk greater than 7.5%. Continued Aspirin 81 mg and Continued atorvastatin 40 mg daily.   Hypertension longstanding diagnosed currently uncontrolled.  Patient reports adherence with medication. Will not make any other medication changes today given patients uncontrolled  depression.  Will followup at next visit.   Hx of Depression/Bipolar/Insomnia with Poor Appetite currently uncontrolled on current therapy. Patient has not seen mental health in ~ 2 years, currently only sleeping 5  hours per night.  Patient denies use of quetiapine and reports taking Remeron PRN due to oversedation. She denies thoughts of suicide or hurting others. Invited support from [name] LCSW in clinic today.  She assisted with scheduling mental health follow-up for drug therapy management.  -Scheduled Flexeril 10mg  every night for sleep after consulting with Dr. Andria Frames.  -Consider stopping Remeron at next visit due to sedation.   F/U with PCP for further mental health follow-up.   Smoking Cessation: Currrently smoking 1/2 - 3/4 PPD. Discussed smoking cessation and patient set goal of 10 cigarettes/day.  Followup at next visit.   Written patient instructions provided.  Total time in face to face counseling 70 minutes.   Follow up with MD in 2 weeks.   Patient seen with Shelle Iron, PharmD Candidate, Loney Hering, PharmD PGY-1 Resident. and Bennye Alm, PharmD, BCPS, PGY2 Resident.

## 2016-12-11 NOTE — Assessment & Plan Note (Signed)
Smoking Cessation: Currrently smoking 1/2 - 3/4 PPD. Discussed smoking cessation and patient set goal of 10 cigarettes/day.  Followup at next visit.

## 2016-12-11 NOTE — Assessment & Plan Note (Signed)
Hx of Depression/Bipolar/Insomnia with Poor Appetite currently uncontrolled on current therapy. Patient has not seen mental health in ~ 2 years, currently only sleeping 5 hours per night.  Patient denies use of quetiapine and reports taking Remeron PRN due to oversedation. She denies thoughts of suicide or hurting others. Invited support from [name] LCSW in clinic today.  She assisted with scheduling mental health follow-up for drug therapy management.  -Scheduled Flexeril 10mg  every night for sleep after consulting with Dr. Andria Frames.  -Consider stopping Remeron at next visit due to sedation.   F/U with PCP for further mental health follow-up.

## 2016-12-11 NOTE — Progress Notes (Signed)
  Total time:20 minutes Type of Service: [place]  Interpretor:No. . Interpreter Name and Language:NA  SUBJECTIVE: [name] is a 61 y.o. female  referred by Dr. Valentina Lucks for:  depression. Patient is accompanied by her minor granddaughter.  Patient ok for LCSW to talk with child in the room.  Patient reports the following symptoms and or concerns: depression, loss of interest in favorite activities and sleep disturbance.  Duration of problem:  Most of her life, been off of medication for about 1 1/2 years Impact on function: negative impact on her marriage   PSYCHIATRIC HISTORY - Diagnosis: Bipolar, major depression - Hospitalizations/ Outpatient therapy:  hospitalized in 1998 for SI, last outpatient treatment with [facility] 1 1/2 years ago -Pharmacotherapy:none at this time  OBJECTIVE: Mood: Depressed ; Thought process: Coherent;  Affect: Appropriate and Depressed Risk of harm to self or others: Suicidal ideation No plan to harm self or others Suicide Assessment Plan: - How specific is the plan: no plan, random passive thoughts  - Does the patient have social support: support system with husband and adult children Protective factors (what has kept the patient from self-harm thus far):  Patient's granddaughter keeps her going Substance use / abuse:  Not assessed Presence of hallucinations / delusions: none History of SI / Attempts: 22 Family history of attempted or completed suicide: no Duration and Intensity of SI: occasionally maybe once a month History of prior psychiatric hospitalizations: 1998 Chart review for additional risk factors : insomnia, ...Dr. Valentina Lucks providing medication.  Coping mechanisms: spending time with granddaughter and husband How likely are you to act on these thoughts of hurting yourself or ending your life sometime over the next month? NOT LIKELY AT ALL  LIFE CONTEXT:  Family & Social: lives with husband  Higher education careers adviser Work:  receives disability   Life changes: no recent chances  GOALS ADDRESSED:  Patient will reduce symptoms of: depression and insomnia; increase knowledge or ability [id number] skills.  INTERVENTIONS:Screening Tool(s)  Administered and Supportive Counseling,  Link to [place] Provider and Reflective listening Standardized Assessments completed: PHQ 9=20,Severity: severe.  ASSESSMENT:  Patient currently experiencing depression.  Symptoms exacerbated by being off of her medication for over a year.  Patient may benefit from, and is in agreement to receive further assessment, medication management and therapy to assist with managing her symptoms.  PLAN: 1. LCSW will F/U with phone call in 2 to 3 business days. [phone] 2. Referral:[facility] 3. From scale of 1-10, how likely are you to follow plan: 10  Warm Hand Off Completed.     [name], LCSW Licensed Clinical Social Worker [place]   [phone] 4:29 PM

## 2016-12-12 NOTE — Progress Notes (Signed)
Patient ID: [name], female   DOB: October 08, 1955, 61 y.o.   MRN: [id number] Reviewed: Agree with Dr. Graylin Shiver documentation and management.

## 2016-12-15 ENCOUNTER — Telehealth: Payer: Self-pay | Admitting: Licensed Clinical Social Worker

## 2016-12-15 NOTE — Progress Notes (Signed)
[facility] f/u phone call to patient reference interventions discussed and resources provided during joint pharmacy visit with Dr. Valentina Lucks. Patient has not contacted the provided given to her during this visit. Assessed patient for current state and reviewed 24 hr options if needed.  Patient reports she is doing fine but understands the importance of F/U with the mental health resources.   LCSW also reminded patient of her appointment next week at [facility].  Plan:  1. Patient will call resources provided to schedule an appointment with a mental health provider 2. LCSW will F/U with patient during office visit next week.  [name], LCSW Licensed Clinical Social Worker [place]   [phone] 4:00 PM

## 2016-12-18 ENCOUNTER — Ambulatory Visit: Payer: Medicare HMO | Admitting: Internal Medicine

## 2016-12-25 ENCOUNTER — Ambulatory Visit: Payer: Medicare HMO | Admitting: Internal Medicine

## 2016-12-31 ENCOUNTER — Telehealth: Payer: Self-pay | Admitting: Licensed Clinical Social Worker

## 2016-12-31 NOTE — Progress Notes (Signed)
[facility] f/u phone call to patient reference interventions discussed and resources provided during joint pharmacy visit with Dr. Valentina Lucks. Patient. Patient states is doing ok.  She has connected with [facility] for mental health follow-up and medication management.  Her appointment is tomorrow 01/01/17 at 11:00.  Patient appreciative of f/u from LCSW.  Also reminded patient of upcoming appointment with PCP in Aug.  Creed Kail, LCSW Licensed Clinical Social Worker [place]   [phone] 9:03 AM

## 2017-01-27 NOTE — Progress Notes (Signed)
Subjective:   Patient ID: [name]    DOB: October 04, 1955, 61 y.o. female   MRN: [id number]  CC: "Diabetes"  HPI: [name] is a 61 y.o. female who presents to clinic today for diabetes. Problems discussed today are as follows:  Diabetes: Patient without glucometer. States average 200-300, fasting 200, high 400s, low 50 (this was with Humalog use which was recently discontinued). Patient prescribed Jardiance but waiting for medication to be transferred to pharmacy. ROS: Denies fevers or chills, nausea or vomiting, change in vision.  Left rib pain: Ongoing issue since 2017. No changes as of today. Continues to be an aggravation and patient wants options for pain control. Cannot recall trauma to chest wall. Previously attempted Tylenol, and muscle relaxer without improvement. ROS: Denies chest pain, shortness of breath, productive cough, trauma.  Smoker: Current every day smoker with one half packs per day. Not currently interested in smoking at this time. Would like to establish with new mental health provider prior to smoking cessation. ROS: Denies change in weight.  Complete ROS performed, see HPI for pertinent.  [place]: IDDM with HLD and gastroparesis, HTN, tardive dyskinesia, fibromyalgia, bipolar disorder, tobacco use disorder, major recurrent depression. Surgical history TAH, peripheral iridectomy. Family history DM, MI, bipolar. Smoking status reviewed. Medications reviewed.  Objective:   BP 130/60   Pulse 73   Temp 98.3 F (36.8 C) (Oral)   Ht 5\' 2"  (1.575 m)   Wt 143 lb (64.9 kg)   SpO2 97%   BMI 26.16 kg/m  Vitals and nursing note reviewed.  General: obese, well nourished, well developed, in no acute distress with non-toxic appearance HEENT: normocephalic, atraumatic, moist mucous membranes CV: regular rate and rhythm without murmurs, rubs, or gallops, no lower extremity edema Lungs: clear to auscultation bilaterally with normal work of breathing Skin: warm,  dry, cap refill < 2 seconds, no dermatomal rash appreciated Extremities: warm and well perfused, normal tone MSK: rib cage symmetrical bilaterally without step-off or deformity, appropriate thoracic expansion with deep inspiration, tender on left rib cage at 9th and 10th rib  Assessment & Plan:   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 Workup since 2017 with negative CXR. Atraumatic history. Superficial tenderness suspicious for MSK etiology. Differential also includes allodynia, however pulmonary malignancy must be ruled out given smoking history and no recent chest CT. --Low dose contrast CT of chest ordered --Voltaren gel QID PRN pain --Provided direct muscle energy to 10th rib without improvement  DM (diabetes mellitus), type 2, uncontrolled (HCC) Chronic. Uncontrolled. Patient not taking Jardiance. Will hold on changing current Lantus use. History of labile glucose control with frequent hypoglycemic events. --Patient to take Jardiance 10 mg daily --Continue Lantus 30 units daily --Patient to bring glucometer at next visit --RTC 1 month  Tobacco use disorder Chronic. Currently smoking 1/2 packs per day. Not currently interested in cessation. --Will hold on smoking cessation given new BH for mood disorder --Given 1-800-QUIT-NOW for free materials  Orders Placed This Encounter  Procedures  . CT CHEST LUNG CA SCREEN LOW DOSE W/O CM    Standing Status:   Future    Standing Expiration Date:   03/31/2018    Order Specific Question:   Reason for Exam (SYMPTOM  OR DIAGNOSIS REQUIRED)    Answer:   rib pain    Order Specific Question:   Preferred Imaging Location?    Answer:   [facility]    Order Specific Question:   Preferred imaging location?  Answer:   [facility]    Order Specific Question:   Radiology Contrast Protocol - do NOT remove file path    Answer:   [email or web address]   Meds ordered this encounter  Medications  .  diclofenac sodium (VOLTAREN) 1 % GEL    Sig: Apply 2 g topically 4 (four) times daily.    Dispense:  1 Tube    Refill:  0    Harriet Butte, [place], PGY-2 01/30/2017 10:31 AM

## 2017-01-29 ENCOUNTER — Encounter: Payer: Self-pay | Admitting: Family Medicine

## 2017-01-29 ENCOUNTER — Ambulatory Visit (INDEPENDENT_AMBULATORY_CARE_PROVIDER_SITE_OTHER): Payer: Medicare HMO | Admitting: Family Medicine

## 2017-01-29 VITALS — BP 130/60 | HR 73 | Temp 98.3°F | Ht 62.0 in | Wt 143.0 lb

## 2017-01-29 DIAGNOSIS — R69 Illness, unspecified: Secondary | ICD-10-CM | POA: Diagnosis not present

## 2017-01-29 DIAGNOSIS — I5032 Chronic diastolic (congestive) heart failure: Secondary | ICD-10-CM | POA: Diagnosis not present

## 2017-01-29 DIAGNOSIS — I10 Essential (primary) hypertension: Secondary | ICD-10-CM

## 2017-01-29 DIAGNOSIS — F17200 Nicotine dependence, unspecified, uncomplicated: Secondary | ICD-10-CM

## 2017-01-29 DIAGNOSIS — E1100 Type 2 diabetes mellitus with hyperosmolarity without nonketotic hyperglycemic-hyperosmolar coma (NKHHC): Secondary | ICD-10-CM | POA: Diagnosis not present

## 2017-01-29 DIAGNOSIS — R0781 Pleurodynia: Secondary | ICD-10-CM | POA: Diagnosis not present

## 2017-01-29 MED ORDER — DICLOFENAC SODIUM 1 % TD GEL: 2.0000 g | Freq: Four times a day (QID) | TRANSDERMAL | 0 refills | Status: DC

## 2017-01-29 NOTE — Assessment & Plan Note (Addendum)
Chronic. Uncontrolled. Patient not taking Jardiance. Will hold on changing current Lantus use. History of labile glucose control with frequent hypoglycemic events. --Patient to take Jardiance 10 mg daily --Continue Lantus 30 units daily --Patient to bring glucometer at next visit --RTC 1 month

## 2017-01-29 NOTE — Assessment & Plan Note (Addendum)
Chronic. Workup since 2017 with negative CXR. Atraumatic history. Superficial tenderness suspicious for MSK etiology. Differential also includes allodynia, however pulmonary malignancy must be ruled out given smoking history and no recent chest CT. --Low dose contrast CT of chest ordered --Voltaren gel QID PRN pain --Provided direct muscle energy to 10th rib without improvement

## 2017-01-29 NOTE — Patient Instructions (Addendum)
Thank you for coming in to see [place] today. Please see below to review our plan for today's visit.  1. Your diabetes is still not controlled. Please take the Jardiance prescribed when you get this medication. Continue taking your Lantus 30 units daily. Monitor her glucose levels first thing in the morning (fasting), and after meals (postprandial). IT IS IMPORTANT YOU BRING YOUR GLUCOMETER DURING YOUR NEXT VISIT. 2. Given your chronic rib pain and smoking history, we will go ahead and set you up for a low contrast CT. I will call you regarding the results of this test. The most important thing for the decubitus stop smoking. If you need help with this, please let me know any point. In the meantime you can call 1800-quit-now for free resources. 3. Concerning the rib pain, you can continue taking Tylenol and applying warm compress to the area. It is important to remain active despite having this pain. Having a sedentary lifestyle will not improve the pain. I will prescribe any medication called Voltaren gel. Apply this to the affected area 4 times daily as needed. Allow the gel to dry before putting on clothes. Be sure to wash your hands thoroughly when applying this.  Return to clinic in 1 month.  Please call the clinic at [phone] if your symptoms worsen or you have any concerns. It was my pleasure to see you. -- Harriet Butte, [place], PGY-2   Lung Cancer Screening A lung cancer screening is a test that checks for lung cancer. Lung cancer screening is done to look for lung cancer in its very early stages, before it spreads and becomes harder to treat and before symptoms appear. Finding cancer early improves the chances of successful treatment. It may save your life. Should I be screened for lung cancer? You should be screened for lung cancer if all of these apply:  You currently smoke or you have quit smoking within the past 15 years.  You are 50-62 years old.  Screening may be recommended up to age 34 depending on your overall health and other factors.  You are in good general health.  You have a 30-pack-year smoking history.  To find your pack-year history, multiply how many packages of cigarettes you smoked each day by the number of years you smoked. For example, if you smoked two packs of cigarettes each day for 15 years, your pack-year history is 30. If you are not sure what your pack-year smoking history is, ask your health care provider. Screening may also be recommended if you are at high risk for the disease. You may be at high risk if:  You have a family history of lung cancer.  You have been exposed to asbestos.  You have chronic obstructive pulmonary disease (COPD).  You have a history of previous lung cancer.  How often should I be screened for lung cancer? If you are at risk for lung cancer, it is recommended that you are screened once a year. The recommended screening test is a low-dose CT scan. How can I lower my risk of lung cancer? To lower your risk of developing lung cancer:  If you smoke, stop smoking all tobacco products.  Avoid secondhand smoke.  Avoid exposure to radiation.  Avoid exposure to radon gas. Have your home checked for radon regularly.  Avoid things that cause cancer (carcinogens).  Avoid living or working in places with high air pollution.  Where to find more information: Ask your health care provider  about the risks and benefits of screening. More information and resources are available from these organizations:  [place] (ACS): www.cancer.org  American Lung Association: www.lung.org  Contact a health care provider if:  You start to show symptoms of lung cancer, including: ? Coughing that will not go away. ? Wheezing. ? Chest pain. ? Coughing up blood. ? Shortness of breath. ? Weight loss that cannot be explained. ? Constant fatigue. Summary  Lung cancer screening may  find lung cancer before symptoms appear. Finding cancer early improves the chances of successful treatment. It may save your life.  If you are at risk for lung cancer, it is recommended that you are screened once a year. The recommended screening test is a low-dose CT scan.  You can make lifestyle changes to lower your risk of lung cancer.  Ask your health care provider about the risks and benefits of screening. This information is not intended to replace advice given to you by your health care provider. Make sure you discuss any questions you have with your health care provider. Document Released: 04/23/2016 Document Revised: 04/23/2016 Document Reviewed: 04/23/2016 Elsevier Interactive Patient Education  [employer].

## 2017-01-29 NOTE — Assessment & Plan Note (Addendum)
Chronic. Currently smoking 1/2 packs per day. Not currently interested in cessation. --Will hold on smoking cessation given new BH for mood disorder --Given 1-800-QUIT-NOW for free materials

## 2017-02-05 ENCOUNTER — Ambulatory Visit (HOSPITAL_COMMUNITY)
Admission: RE | Admit: 2017-02-05 | Discharge: 2017-02-05 | Disposition: A | Discharge status: Home / Self Care | Payer: Medicare HMO | Source: Ambulatory Visit | Attending: Family Medicine | Admitting: Family Medicine

## 2017-02-05 DIAGNOSIS — F17200 Nicotine dependence, unspecified, uncomplicated: Secondary | ICD-10-CM

## 2017-02-05 DIAGNOSIS — R0781 Pleurodynia: Secondary | ICD-10-CM | POA: Insufficient documentation

## 2017-02-05 DIAGNOSIS — R69 Illness, unspecified: Secondary | ICD-10-CM | POA: Diagnosis not present

## 2017-02-05 DIAGNOSIS — J432 Centrilobular emphysema: Secondary | ICD-10-CM | POA: Insufficient documentation

## 2017-02-05 DIAGNOSIS — I700 Atherosclerosis of aorta: Secondary | ICD-10-CM | POA: Diagnosis not present

## 2017-02-05 DIAGNOSIS — F17210 Nicotine dependence, cigarettes, uncomplicated: Secondary | ICD-10-CM | POA: Diagnosis not present

## 2017-02-06 ENCOUNTER — Encounter: Payer: Self-pay | Admitting: Family Medicine

## 2017-02-06 DIAGNOSIS — J439 Emphysema, unspecified: Secondary | ICD-10-CM | POA: Insufficient documentation

## 2017-02-11 ENCOUNTER — Other Ambulatory Visit: Payer: Self-pay | Admitting: Internal Medicine

## 2017-02-11 ENCOUNTER — Other Ambulatory Visit: Payer: Self-pay | Admitting: Family Medicine

## 2017-02-11 DIAGNOSIS — Z794 Long term (current) use of insulin: Principal | ICD-10-CM

## 2017-02-11 DIAGNOSIS — IMO0002 Reserved for concepts with insufficient information to code with codable children: Secondary | ICD-10-CM

## 2017-02-11 DIAGNOSIS — E1100 Type 2 diabetes mellitus with hyperosmolarity without nonketotic hyperglycemic-hyperosmolar coma (NKHHC): Secondary | ICD-10-CM

## 2017-02-11 DIAGNOSIS — E1165 Type 2 diabetes mellitus with hyperglycemia: Principal | ICD-10-CM

## 2017-02-11 DIAGNOSIS — E1142 Type 2 diabetes mellitus with diabetic polyneuropathy: Secondary | ICD-10-CM

## 2017-02-11 MED ORDER — EMPAGLIFLOZIN 10 MG PO TABS: 10.0000 mg | ORAL_TABLET | Freq: Every day | ORAL | 2 refills | Status: DC

## 2017-03-05 ENCOUNTER — Ambulatory Visit (INDEPENDENT_AMBULATORY_CARE_PROVIDER_SITE_OTHER): Payer: Medicare HMO | Admitting: Family Medicine

## 2017-03-05 ENCOUNTER — Encounter: Payer: Self-pay | Admitting: Family Medicine

## 2017-03-05 VITALS — BP 138/70 | HR 90 | Temp 98.3°F | Ht 62.0 in | Wt 146.0 lb

## 2017-03-05 DIAGNOSIS — Z23 Encounter for immunization: Secondary | ICD-10-CM | POA: Diagnosis not present

## 2017-03-05 DIAGNOSIS — E1100 Type 2 diabetes mellitus with hyperosmolarity without nonketotic hyperglycemic-hyperosmolar coma (NKHHC): Secondary | ICD-10-CM

## 2017-03-05 DIAGNOSIS — F17200 Nicotine dependence, unspecified, uncomplicated: Secondary | ICD-10-CM

## 2017-03-05 DIAGNOSIS — R0781 Pleurodynia: Secondary | ICD-10-CM

## 2017-03-05 DIAGNOSIS — R69 Illness, unspecified: Secondary | ICD-10-CM | POA: Diagnosis not present

## 2017-03-05 LAB — POCT GLYCOSYLATED HEMOGLOBIN (HGB A1C): HEMOGLOBIN A1C: 11.7

## 2017-03-05 MED ORDER — DAPAGLIFLOZIN PROPANEDIOL 10 MG PO TABS: 10.0000 mg | ORAL_TABLET | Freq: Every day | ORAL | 3 refills | Status: DC

## 2017-03-05 MED ORDER — DICLOFENAC SODIUM 1 % TD GEL: 2.0000 g | Freq: Four times a day (QID) | TRANSDERMAL | 0 refills | Status: DC

## 2017-03-05 NOTE — Progress Notes (Signed)
Subjective:   Patient ID: [name]    DOB: [birth year], 61 y.o. female   MRN: [id number]  CC: "Diabetes"  HPI: [name] is a 61 y.o. female who presents to clinic today for diabetes. Problems discussed today are as follows:  Diabetes: Patient followed up with Dr. Valentina Lucks since last visit. Has not started Jardiance due to lack of coverage. Continuing to take Lantus 30 units daily. Patient without glucometer because she left it in [place] when she was visiting family. Will be able to get next week when she returns. Says he CBG have ranged "100 to 200 to 300." High 425. No lows.   Left rib pain: Contiues to have pain in same area. Chronic issue for many years. Unable to receive Diclofenac gel from pharmacy because "they did not have it." Patient is pleased to know her low dose CT was reassuring. ROS: Denies fever or chills, chest pain, shortness of breath, palpitations, orthopnea.  Smoking: Continues to smoke 1/2-3/4 pack daily. Has a goal of 10 cigs/daily since seeing Dr. Valentina Lucks. Making efforts to cut down on use. ROS: Denies cough, wheeze.  Complete ROS performed, see HPI for pertinent.  [place]: IDDM with HLD and gastroparesis, HTN, tardive dyskinesia, fibromyalgia, bipolar disorder, tobacco use disorder, major recurrent depression. Surgical history TAH, peripheral iridectomy. Family history DM, MI, bipolar. Smoking status reviewed. Medications reviewed.  Objective:   BP 138/70 (BP Location: Right Arm, Patient Position: Sitting, Cuff Size: Normal)   Pulse 90   Temp 98.3 F (36.8 C) (Oral)   Ht 5\' 2"  (1.575 m)   Wt 146 lb (66.2 kg)   SpO2 97%   BMI 26.70 kg/m  Vitals and nursing note reviewed.  General: well nourished, well developed, in no acute distress with non-toxic appearance CV: regular rate and rhythm without murmurs, rubs, or gallops, no lower extremity edema Lungs: clear to auscultation bilaterally with normal work of breathing Abdomen: soft, non-tender,  non-distended, normoactive bowel sounds Skin: warm, dry, no rashes or lesions, cap refill < 2 seconds Extremities: warm and well perfused, normal tone MSK: no step-off at left ribcage, symmetrical movement of ribs on inhalation and exhalation  Assessment & Plan:   DM (diabetes mellitus), type 2, uncontrolled (HCC) Chronic. Poorly controlled. A1c 11.7. Patient without glucometer and unable to specify CBG. Not adherent to Jardiance due to cost. --Switched from [place] to [place] 10 mg daily, given information, continue Lantus 30 units daily and bring glucometer to next visit --Received flu shot --RTC 1 month  Tobacco use disorder Chronic. Has emphysema by low dose CT last visit. Making efforts to cut back. 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 No findings of low dise CT last visit which would explain source. Likely MSK related. No bony abnormality on exam.  --Refill given for Diclofinac gel  Orders Placed This Encounter  Procedures  . Flu Vaccine QUAD 36+ mos IM  . HgB A1c   Meds ordered this encounter  Medications  . diclofenac sodium (VOLTAREN) 1 % GEL    Sig: Apply 2 g topically 4 (four) times daily.    Dispense:  1 Tube    Refill:  0  . dapagliflozin propanediol (FARXIGA) 10 MG TABS tablet    Sig: Take 10 mg by mouth daily.    Dispense:  90 tablet    Refill:  [place], [place], PGY-2 03/06/2017 10:01  AM

## 2017-03-05 NOTE — Patient Instructions (Signed)
Thank you for coming in to see [place] today. Please see below to review our plan for today's visit.  1. I have started you on a new medication called Wilder Glade which you will take instead or Jardiance. Below is some information on the medication. Continue checking your glucose levels when you get your glucometer and BRING IT TO YOUR NEXT VISIT. Continue Lantus 30 units daily. 2. I have resent the prescription for your Voltaren Gel. If you have difficulty getting this, let me know. 3. I would like to see you in 1 month after you take your [place]. If there is difficulty getting this medication, let me know.  Please call the clinic at [phone] if your symptoms worsen or you have any concerns. It was my pleasure to see you. -- Harriet Butte, [place], PGY-2  Dapagliflozin tablets What is this medicine? DAPAGLIFLOZIN (DAP a gli FLOE zin) helps to treat type 2 diabetes. It helps to control blood sugar. Treatment is combined with diet and exercise. This medicine may be used for other purposes; ask your health care provider or pharmacist if you have questions. COMMON BRAND NAME(S): Wilder Glade What should I tell my health care provider before I take this medicine? They need to know if you have any of these conditions: -bladder cancer -dehydration -diabetic ketoacidosis -diet low in salt -eating less due to illness, surgery, dieting, or any other reason -having surgery -high cholesterol -history of pancreatitis or pancreas problems -history of yeast infection of the penis or vagina -if you often drink alcohol -infections in the bladder, kidneys, or urinary tract -kidney disease -low blood pressure -on hemodialysis -problems urinating -type 1 diabetes -uncircumcised female -an unusual or allergic reaction to dapagliflozin, other medicines, foods, dyes, or preservatives -pregnant or trying to get pregnant -breast-feeding How should I use this medicine? Take this medicine  by mouth with a glass of water. Follow the directions on the prescription label. You can take it with or without food. If it upsets your stomach, take it with food. Take this medicine in the morning. Take your dose at the same time each day. Do not take more often than directed. Do not stop taking except on your doctor's advice. A special MedGuide will be given to you by the pharmacist with each prescription and refill. Be sure to read this information carefully each time. Talk to your pediatrician regarding the use of this medicine in children. Special care may be needed. Overdosage: If you think you have taken too much of this medicine contact a poison control center or emergency room at once. NOTE: This medicine is only for you. Do not share this medicine with others. What if I miss a dose? If you miss a dose, take it as soon as you can. If it is almost time for your next dose, take only that dose. Do not take double or extra doses. What may interact with this medicine? Do not take this medicine with any of the following medications: -gatifloxacin This medicine may also interact with the following medications: -alcohol -certain medicines for blood pressure, heart disease -diuretics -insulin -nateglinide -pioglitazone -quinolone antibiotics like ciprofloxacin, levofloxacin, ofloxacin -repaglinide -some herbal dietary supplements -steroid medicines like prednisone or cortisone -sulfonylureas like glimepiride, glipizide, glyburide -thyroid medicine This list may not describe all possible interactions. Give your health care provider a list of all the medicines, herbs, non-prescription drugs, or dietary supplements you use. Also tell them if you smoke, drink alcohol, or use illegal drugs. Some  items may interact with your medicine. What should I watch for while using this medicine? Visit your doctor or health care professional for regular checks on your progress. This medicine can cause a  serious condition in which there is too much acid in the blood. If you develop nausea, vomiting, stomach pain, unusual tiredness, or breathing problems, stop taking this medicine and call your doctor right away. If possible, use a ketone dipstick to check for ketones in your urine. A test called the HbA1C (A1C) will be monitored. This is a simple blood test. It measures your blood sugar control over the last 2 to 3 months. You will receive this test every 3 to 6 months. Learn how to check your blood sugar. Learn the symptoms of low and high blood sugar and how to manage them. Always carry a quick-source of sugar with you in case you have symptoms of low blood sugar. Examples include hard sugar candy or glucose tablets. Make sure others know that you can choke if you eat or drink when you develop serious symptoms of low blood sugar, such as seizures or unconsciousness. They must get medical help at once. Tell your doctor or health care professional if you have high blood sugar. You might need to change the dose of your medicine. If you are sick or exercising more than usual, you might need to change the dose of your medicine. Do not skip meals. Ask your doctor or health care professional if you should avoid alcohol. Many nonprescription cough and cold products contain sugar or alcohol. These can affect blood sugar. Wear a medical ID bracelet or chain, and carry a card that describes your disease and details of your medicine and dosage times. What side effects may I notice from receiving this medicine? Side effects that you should report to your doctor or health care professional as soon as possible: -allergic reactions like skin rash, itching or hives, swelling of the face, lips, or tongue -breathing problems -dizziness -feeling faint or lightheaded, falls -muscle weakness -nausea, vomiting, unusual stomach upset or pain -signs and symptoms of low blood sugar such as feeling anxious, confusion,  dizziness, increased hunger, unusually weak or tired, sweating, shakiness, cold, irritable, headache, blurred vision, fast heartbeat, loss of consciousness -signs and symptoms of a urinary tract infection, such as fever, chills, a burning feeling when urinating, blood in the urine, back pain -trouble passing urine or change in the amount of urine, including an urgent need to urinate more often, in larger amounts, or at night -penile discharge, itching, or pain in men -unusual tiredness -vaginal discharge, itching, or odor in women Side effects that usually do not require medical attention (report to your doctor or health care professional if they continue or are bothersome): -constipation -mild increase in urination -stuffy or runny nose -sore throat -thirsty This list may not describe all possible side effects. Call your doctor for medical advice about side effects. You may report side effects to FDA at 1-800-FDA-1088. Where should I keep my medicine? Keep out of the reach of children. Store at room temperature between 15 and 30 degrees C (59 and 86 degrees F). Throw away any unused medicine after the expiration date. NOTE: This sheet is a summary. It may not cover all possible information. If you have questions about this medicine, talk to your doctor, pharmacist, or health care provider.  2018 Elsevier/Gold Standard (2015-07-05 08:46:40)

## 2017-03-06 ENCOUNTER — Telehealth: Payer: Self-pay | Admitting: Family Medicine

## 2017-03-06 NOTE — Assessment & Plan Note (Signed)
Chronic. Has emphysema by low dose CT last visit. Making efforts to cut back. --Encouraged patient every cig she cuts back on is a victory

## 2017-03-06 NOTE — Assessment & Plan Note (Signed)
Chronic. No findings of low dise CT last visit which would explain source. Likely MSK related. No bony abnormality on exam.  --Refill given for Diclofinac gel

## 2017-03-06 NOTE — Assessment & Plan Note (Signed)
Chronic. Poorly controlled. A1c 11.7. Patient without glucometer and unable to specify CBG. Not adherent to Jardiance due to cost. --Switched from [place] to [place] 10 mg daily, given information, continue Lantus 30 units daily and bring glucometer to next visit --Received flu shot --RTC 1 month

## 2017-03-06 NOTE — Telephone Encounter (Signed)
We sent in 2 prescriptions yesterday for the patient. Wilder Glade and Votaren needs to be sent to a Management consultant. Can we send this to [facility] on [address]. jw

## 2017-03-10 ENCOUNTER — Other Ambulatory Visit: Payer: Self-pay | Admitting: Family Medicine

## 2017-03-10 DIAGNOSIS — E1100 Type 2 diabetes mellitus with hyperosmolarity without nonketotic hyperglycemic-hyperosmolar coma (NKHHC): Secondary | ICD-10-CM

## 2017-03-10 DIAGNOSIS — R0781 Pleurodynia: Secondary | ICD-10-CM

## 2017-03-10 MED ORDER — DAPAGLIFLOZIN PROPANEDIOL 10 MG PO TABS: 10.0000 mg | ORAL_TABLET | Freq: Every day | ORAL | 3 refills | Status: DC

## 2017-03-10 MED ORDER — DICLOFENAC SODIUM 1 % TD GEL: 2.0000 g | Freq: Four times a day (QID) | TRANSDERMAL | 0 refills | Status: DC

## 2017-03-10 NOTE — Telephone Encounter (Signed)
Rx sent to pharmacy. Thanks

## 2017-03-26 DIAGNOSIS — H2512 Age-related nuclear cataract, left eye: Secondary | ICD-10-CM | POA: Diagnosis not present

## 2017-03-26 DIAGNOSIS — E113591 Type 2 diabetes mellitus with proliferative diabetic retinopathy without macular edema, right eye: Secondary | ICD-10-CM | POA: Diagnosis not present

## 2017-03-26 DIAGNOSIS — E113552 Type 2 diabetes mellitus with stable proliferative diabetic retinopathy, left eye: Secondary | ICD-10-CM | POA: Diagnosis not present

## 2017-03-26 DIAGNOSIS — H2511 Age-related nuclear cataract, right eye: Secondary | ICD-10-CM | POA: Diagnosis not present

## 2017-04-23 DIAGNOSIS — E113593 Type 2 diabetes mellitus with proliferative diabetic retinopathy without macular edema, bilateral: Secondary | ICD-10-CM | POA: Diagnosis not present

## 2017-04-23 DIAGNOSIS — H25812 Combined forms of age-related cataract, left eye: Secondary | ICD-10-CM | POA: Diagnosis not present

## 2017-04-23 DIAGNOSIS — H25811 Combined forms of age-related cataract, right eye: Secondary | ICD-10-CM | POA: Diagnosis not present

## 2017-05-13 DIAGNOSIS — H25811 Combined forms of age-related cataract, right eye: Secondary | ICD-10-CM | POA: Diagnosis not present

## 2017-05-13 DIAGNOSIS — H2511 Age-related nuclear cataract, right eye: Secondary | ICD-10-CM | POA: Diagnosis not present

## 2017-05-19 ENCOUNTER — Other Ambulatory Visit: Payer: Self-pay | Admitting: Student

## 2017-05-19 DIAGNOSIS — I10 Essential (primary) hypertension: Secondary | ICD-10-CM

## 2017-05-22 ENCOUNTER — Other Ambulatory Visit: Payer: Self-pay | Admitting: Family Medicine

## 2017-05-22 DIAGNOSIS — E1165 Type 2 diabetes mellitus with hyperglycemia: Principal | ICD-10-CM

## 2017-05-22 DIAGNOSIS — Z794 Long term (current) use of insulin: Principal | ICD-10-CM

## 2017-05-22 DIAGNOSIS — IMO0002 Reserved for concepts with insufficient information to code with codable children: Secondary | ICD-10-CM

## 2017-05-22 DIAGNOSIS — R69 Illness, unspecified: Secondary | ICD-10-CM | POA: Diagnosis not present

## 2017-05-22 DIAGNOSIS — E1142 Type 2 diabetes mellitus with diabetic polyneuropathy: Secondary | ICD-10-CM

## 2017-05-22 MED ORDER — GLUCOSE BLOOD VI STRP: ORAL_STRIP | 12 refills | Status: DC

## 2017-06-19 NOTE — Progress Notes (Signed)
Subjective   Patient ID: [name]    DOB: 02-01-1956, 62 y.o. female   MRN: [id number]  CC: "Diabetes"  HPI: [name] is a 62 y.o. female who presents to clinic today for the following:  Diabetes: Patient did not take [place] as instructed during last visit.  She is worried that it would be "too much against her body."  Her biggest concern is the side effect of dehydration.  She has a difficult time with maintaining a regular diet and feels she does not take in enough fluids to prevent from getting dehydrated with this medication.  She is interested in restarting metformin which is currently on her allergy list.  She states that she took metformin for many years but had difficulty breathing after taking the medication for some time.  She does not think that this caused her symptoms of shortness of breath.  Patient is scheduled to see ophthalmologist for diabetic eye exam next week.  Smoking: Patient is interested in smoking cessation today.  She has been smoking since age 62 with approximately 1/2-3/4 pack per day.  She has cut it down to less than half a pack per day as of today.  This is approximately her fifth attempt.  She is tried weaning cigarette use but has not used medications or nicotine replacement therapy.  Hyperlipidemia: Patient is taking statin therapy.  Making efforts to avoid high carbohydrate and fat intake.  ROS: see HPI for pertinent.  [place]: uncontrolled T2DM with neuropathy and gastroparesis, HTN, HFpEF, COPD emphysema, tobacco use disorder, MDD, bipolar, fibromyalgia, cataracts. Surgical history TAH, LHC, iridectomy. Family history heart disease, DM, bipolar. Smoking status reviewed. Medications reviewed.  Objective   BP 110/68   Pulse 95   Temp 98.5 F (36.9 C) (Oral)   Wt 144 lb 12.8 oz (65.7 kg)   SpO2 98%   BMI 26.48 kg/m  Vitals and nursing note reviewed.  General: pleasant woman, NAD with non-toxic appearance HEENT: normocephalic,  atraumatic, moist mucous membranes Neck: supple, non-tender without lymphadenopathy Cardiovascular: regular rate and rhythm without murmurs, rubs, or gallops Lungs: clear to auscultation bilaterally with normal work of breathing Abdomen: soft, non-tender, non-distended, normoactive bowel sounds Skin: warm, dry, no rashes or lesions, cap refill < 2 seconds Extremities: warm and well perfused, normal tone, no edema  Assessment & Plan   DM (diabetes mellitus), type 2, uncontrolled (HCC) Chronic.  Uncontrolled.  Currently taking basal insulin without bolus use due to history of hypoglycemia.  Patient not interested in SGLT therapy.  Does have metformin allergy though patient insisted restarting.  Low suspicion for allergy given associated COPD likely caused SOB. - Advised patient to continue Lantus 30 units daily -We will give trial of metformin 500 mg daily for 1 week at which point patient will increase to twice daily, and patient is instructed to take this medication with caution with her husband nearby in case she has a reaction - Patient to bring a glucometer during next visit - Ambulatory referral to diabetic education - Obtaining CBC with differential, CMET, TSH, LDL  Tobacco use disorder Chronic.  Current everyday smoker at approximately 1/2 pack/day.  Prior attempts without use of medications or supplements.  Interested in Chantix today.  Lives with husband who smokes who is also interested in cessation who has appointment scheduled on Monday. - Given prescription for Chantix and discussed adverse reactions - RTC 1 month  Hyperlipidemia due to type 2 diabetes mellitus (HCC) Chronic.  Has uncontrolled diabetes.  Other comorbidities include blood pressure and smoking. - Obtaining LDL level and will follow-up as appropriate - Continue Lipitor 40 mg daily  Orders Placed This Encounter  Procedures  . CBC with Differential/Platelet  . CMP14+EGFR  . LDL cholesterol, direct  . TSH  .  Ambulatory referral to diabetic education    Referral Priority:   Routine    Referral Type:   Consultation    Referral Reason:   Specialty Services Required    Number of Visits Requested:   1  . HgB A1c   Meds ordered this encounter  Medications  . DISCONTD: varenicline (CHANTIX) 0.5 MG tablet    Sig: TAKE 1 TAB ONCE DAILY FOR DAY 1-3, THEN 1 TAB TWICE DAILY FOR DAYS 4-7, THEN 2 TAB TWICE DAILY FOR 11 MORE WEEKS.    Dispense:  100 tablet    Refill:  0  . metFORMIN (GLUCOPHAGE) 500 MG tablet    Sig: TAKE 1 TAB DAILY FOR 1 WEEK, THEN INCREASE TO 2 TABS DAILY.    Dispense:  90 tablet    Refill:  0    Harriet Butte, [place], PGY-2 06/26/2017, 7:05 PM

## 2017-06-25 ENCOUNTER — Other Ambulatory Visit: Payer: Self-pay

## 2017-06-25 ENCOUNTER — Ambulatory Visit (INDEPENDENT_AMBULATORY_CARE_PROVIDER_SITE_OTHER): Payer: Medicare HMO | Admitting: Family Medicine

## 2017-06-25 VITALS — BP 110/68 | HR 95 | Temp 98.5°F | Wt 144.8 lb

## 2017-06-25 DIAGNOSIS — E785 Hyperlipidemia, unspecified: Secondary | ICD-10-CM

## 2017-06-25 DIAGNOSIS — E1165 Type 2 diabetes mellitus with hyperglycemia: Secondary | ICD-10-CM

## 2017-06-25 DIAGNOSIS — E1169 Type 2 diabetes mellitus with other specified complication: Secondary | ICD-10-CM | POA: Diagnosis not present

## 2017-06-25 DIAGNOSIS — F17200 Nicotine dependence, unspecified, uncomplicated: Secondary | ICD-10-CM | POA: Diagnosis not present

## 2017-06-25 DIAGNOSIS — R69 Illness, unspecified: Secondary | ICD-10-CM | POA: Diagnosis not present

## 2017-06-25 LAB — POCT GLYCOSYLATED HEMOGLOBIN (HGB A1C): HEMOGLOBIN A1C: 12.3

## 2017-06-25 MED ORDER — VARENICLINE TARTRATE 0.5 MG PO TABS: ORAL_TABLET | ORAL | 0 refills | Status: DC

## 2017-06-25 MED ORDER — METFORMIN HCL 500 MG PO TABS: ORAL_TABLET | ORAL | 0 refills | Status: DC

## 2017-06-25 NOTE — Assessment & Plan Note (Addendum)
Chronic.  Current everyday smoker at approximately 1/2 pack/day.  Prior attempts without use of medications or supplements.  Interested in Chantix today.  Lives with husband who smokes who is also interested in cessation who has appointment scheduled on Monday. - Given prescription for Chantix and discussed adverse reactions - RTC 1 month

## 2017-06-25 NOTE — Assessment & Plan Note (Addendum)
Chronic.  Uncontrolled.  Currently taking basal insulin without bolus use due to history of hypoglycemia.  Patient not interested in SGLT therapy.  Does have metformin allergy though patient insisted restarting.  Low suspicion for allergy given associated COPD likely caused SOB. - Advised patient to continue Lantus 30 units daily -We will give trial of metformin 500 mg daily for 1 week at which point patient will increase to twice daily, and patient is instructed to take this medication with caution with her husband nearby in case she has a reaction - Patient to bring a glucometer during next visit - Ambulatory referral to diabetic education - Obtaining CBC with differential, CMET, TSH, LDL

## 2017-06-25 NOTE — Patient Instructions (Signed)
Thank you for coming in to see [place] today. Please see below to review our plan for today's visit.  1.  I will start you on metformin again for your diabetes.  I would advise you to take this with caution.  If you develop chest pain or shortness of breath with this medication, seek immediate medical attention and call 911.  I do not anticipate this happening as I do not think the metformin because of this in the first place.  This should be a good medication to help control your diabetes.  I have also placed a referral for you to see a nutritionist.  We will contact you regarding this appointment. 2.  I am pleased to hear you wanting to stop smoking.  I have prescribed you new medication called Chantix.  See below for instructions on how to take this medication.  I would like to see you in 1 month for follow-up regarding this medication.  Chantix dosage: Days 1 to 3: 1 tab once daily Days 4 to 7: 1 tab twice daily After week 1 and beyond: 2 tabs twice daily for 11 weeks  Please call the clinic at [phone] if your symptoms worsen or you have any concerns. It was our pleasure to serve you.  Harriet Butte, [place], PGY-2

## 2017-06-25 NOTE — Assessment & Plan Note (Addendum)
Chronic.  Has uncontrolled diabetes.  Other comorbidities include blood pressure and smoking. - Obtaining LDL level and will follow-up as appropriate - Continue Lipitor 40 mg daily

## 2017-06-26 ENCOUNTER — Other Ambulatory Visit: Payer: Self-pay

## 2017-06-26 ENCOUNTER — Encounter: Payer: Self-pay | Admitting: Family Medicine

## 2017-06-26 DIAGNOSIS — F17200 Nicotine dependence, unspecified, uncomplicated: Secondary | ICD-10-CM

## 2017-06-26 LAB — CMP14+EGFR
A/G RATIO: 1.4 (ref 1.2–2.2)
ALK PHOS: 122 IU/L — AB (ref 39–117)
ALT: 10 IU/L (ref 0–32)
AST: 14 IU/L (ref 0–40)
Albumin: 4.4 g/dL (ref 3.6–4.8)
BILIRUBIN TOTAL: 0.3 mg/dL (ref 0.0–1.2)
BUN / CREAT RATIO: 10 — AB (ref 12–28)
BUN: 9 mg/dL (ref 8–27)
CHLORIDE: 100 mmol/L (ref 96–106)
CO2: 25 mmol/L (ref 20–29)
Calcium: 10.3 mg/dL (ref 8.7–10.3)
Creatinine, Ser: 0.89 mg/dL (ref 0.57–1.00)
GFR calc Af Amer: 80 mL/min/{1.73_m2} (ref >59)
GFR calc non Af Amer: 70 mL/min/{1.73_m2} (ref >59)
GLOBULIN, TOTAL: 3.1 g/dL (ref 1.5–4.5)
Glucose: 117 mg/dL — ABNORMAL HIGH (ref 65–99)
POTASSIUM: 4.6 mmol/L (ref 3.5–5.2)
SODIUM: 142 mmol/L (ref 134–144)
Total Protein: 7.5 g/dL (ref 6.0–8.5)

## 2017-06-26 LAB — CBC WITH DIFFERENTIAL/PLATELET
Basophils Absolute: 0 10*3/uL (ref 0.0–0.2)
Basos: 0 %
EOS (ABSOLUTE): 0.1 10*3/uL (ref 0.0–0.4)
EOS: 1 %
HEMATOCRIT: 37 % (ref 34.0–46.6)
Hemoglobin: 12.1 g/dL (ref 11.1–15.9)
Immature Grans (Abs): 0 10*3/uL (ref 0.0–0.1)
Immature Granulocytes: 0 %
LYMPHS ABS: 4.5 10*3/uL — AB (ref 0.7–3.1)
Lymphs: 58 %
MCH: 30.2 pg (ref 26.6–33.0)
MCHC: 32.7 g/dL (ref 31.5–35.7)
MCV: 92 fL (ref 79–97)
MONOS ABS: 0.5 10*3/uL (ref 0.1–0.9)
Monocytes: 6 %
NEUTROS ABS: 2.7 10*3/uL (ref 1.4–7.0)
Neutrophils: 35 %
Platelets: 378 10*3/uL (ref 150–379)
RBC: 4.01 x10E6/uL (ref 3.77–5.28)
RDW: 14.2 % (ref 12.3–15.4)
WBC: 7.8 10*3/uL (ref 3.4–10.8)

## 2017-06-26 LAB — LDL CHOLESTEROL, DIRECT: LDL Direct: 237 mg/dL — ABNORMAL HIGH (ref 0–99)

## 2017-06-26 LAB — TSH: TSH: 2.06 u[IU]/mL (ref 0.450–4.500)

## 2017-06-26 MED ORDER — VARENICLINE TARTRATE 0.5 MG PO TABS: ORAL_TABLET | ORAL | 0 refills | Status: DC

## 2017-06-27 DIAGNOSIS — R69 Illness, unspecified: Secondary | ICD-10-CM | POA: Diagnosis not present

## 2017-06-29 ENCOUNTER — Telehealth: Payer: Self-pay

## 2017-06-29 DIAGNOSIS — F17200 Nicotine dependence, unspecified, uncomplicated: Secondary | ICD-10-CM

## 2017-06-30 MED ORDER — VARENICLINE TARTRATE 0.5 MG PO TABS: ORAL_TABLET | ORAL | 0 refills | Status: DC

## 2017-07-07 NOTE — Telephone Encounter (Signed)
pts insurance didn't pay for the Chantix.  Please send in something cheaper that her insurance will pay for.  [facility] on [address] Please advise

## 2017-07-08 NOTE — Telephone Encounter (Signed)
Patient has appointment scheduled with me on 07/24/2017.  We will discuss alternative options during this appointment.  In the meantime, please recommend patient calling 1-800-QUITNOW for free resources.  Thank you.

## 2017-07-08 NOTE — Telephone Encounter (Signed)
Called patient per Dr. Blair Promise instruction and gave her the number to call 1-800-QUITNOW for free resources. Patient was reminded that at her future appointment alternatives will be discussed.Ozella Almond, CMA

## 2017-07-09 ENCOUNTER — Encounter: Payer: Self-pay | Admitting: Pharmacist

## 2017-07-09 DIAGNOSIS — F17200 Nicotine dependence, unspecified, uncomplicated: Secondary | ICD-10-CM

## 2017-07-09 MED ORDER — NICOTINE 14 MG/24HR TD PT24: 14.0000 mg | MEDICATED_PATCH | Freq: Every day | TRANSDERMAL | 0 refills | Status: DC

## 2017-07-09 NOTE — Progress Notes (Signed)
Patient ID: [name], female   DOB: Apr 29, 1956, 62 y.o.   MRN: [id number] Reviewed: Agree with Dr. Graylin Shiver documentation and management.

## 2017-07-09 NOTE — Progress Notes (Signed)
   S:  Patient arrives with husband for his PFT evaluatin visit.  He is also trying to quit smoking.   Patient interested in help with quitting.  States she would like to stop but needs patches.  Rates CONFIDENCE of quitting tobacco on 1-10 scale of 9.  Most common triggers to use tobacco include; anger and stress.   Motivation to quit: Husband is quitting and she has developed a chronic cough which is concerning to her.    A/P: moderate Nicotine Dependence of many years duration in a patient who is good candidate for success b/c of current level of motivation and the success of her husband who has quit for 4 days. Initiated nicotine replacement tx 14mg  patch. Patient counseled on purpose, proper use, and potential adverse effects, including itching.  Supply will be used without a new prescription at this time.  Requested patient contact our office for additional patch prescription.   Written information provided.  F/U with PCP - Dr. Yisroel Ramming in 2 weeks. F/U Rx Clinic Visit PRN.  Total time in face-to-face counseling 10 minutes.  Patient seen with husband AND Onnie Boer, PharmD Candidate and Deirdre Pippins, PGY2 Pharmacy Resident, PharmD, BCPS.

## 2017-07-09 NOTE — Assessment & Plan Note (Signed)
moderate Nicotine Dependence of many years duration in a patient who is good candidate for success b/c of current level of motivation and the success of her husband who has quit for 4 days. Initiated nicotine replacement tx 14mg  patch. Patient counseled on purpose, proper use, and potential adverse effects, including itching.  Supply will be used without a new prescription at this time.  Requested patient contact our office for additional patch prescription.

## 2017-07-22 ENCOUNTER — Encounter: Payer: Self-pay | Admitting: Registered"

## 2017-07-22 ENCOUNTER — Encounter: Payer: Medicare HMO | Attending: Medical | Admitting: Registered"

## 2017-07-22 DIAGNOSIS — E1165 Type 2 diabetes mellitus with hyperglycemia: Secondary | ICD-10-CM

## 2017-07-22 DIAGNOSIS — Z713 Dietary counseling and surveillance: Secondary | ICD-10-CM | POA: Diagnosis not present

## 2017-07-22 NOTE — Patient Instructions (Addendum)
Plan:  Aim for 2-3 Carb Choices per meal (30-45 grams)   Aim for 0-1 Carb Choices per snack if hungry (0-30 grams) Include protein with your meals and snacks Aim for 3 balanced meals or snacks per day Consider increasing your activity level daily as tolerated Consider checking blood sugar 2 times per day, fasting and 2 hrs after a meal Continue taking medication as directed by MD Adequate and restful sleep is important for your health and controlling blood sugar Consider asking to have your vitamin D checked Consider taking a vitamin B12 for nerve health Consider wearing shoes all the time. Aim to have beans and fish on a regular basis.

## 2017-07-22 NOTE — Progress Notes (Signed)
Diabetes Self-Management Education  Visit Type: First/Initial  Appt. Start Time: 1110 Appt. End Time: 1230  07/24/2017  [name], identified by name and date of birth, is a 62 y.o. female with a diagnosis of Diabetes: Type 2.   ASSESSMENT This patient is accompanied in the office by her spouse.   Patient is concerned about low blood sugar. Patient did not bring BG log or meter, reports she will wake up with BG in the 70s and is sweaty and tired. Patient states she takes her Lantus at 11 am. Pt states 2 weeks ago she missed her medication and BG was >400.  Patient ambulates slowly and appears to have difficulty walking. Patient states she gets her physical activity when climbing stairs to get to her apartment. At end of appointment patients husband left a little ahead to bring the car closer to the door because he said she doesn't like to walk.  Patient states she has a poor appetite. Patient states she does not eat a full breakfast just enough to take medicine. Patient states she does not get enough sleep: 5-6 hrs, toss and turn. Patient states she has been attempting to quit smoking with use of a patch.  Diabetes Self-Management Education - 07/22/17 1122      Visit Information   Visit Type  First/Initial      Initial Visit   Diabetes Type  Type 2    Are you currently following a meal plan?  No    Are you taking your medications as prescribed?  Yes    Date Diagnosed  [place]   How would you rate your overall health?  Poor      Psychosocial Assessment   Patient Belief/Attitude about Diabetes  Afraid    How often do you need to have someone help you when you read instructions, pamphlets, or other written materials from your doctor or pharmacy?  4 - Often    What is the last grade level you completed in school?  9th      Complications   Last HgB A1C per patient/outside source  12 %    How often do you check your blood sugar?  1-2 times/day    Fasting  Blood glucose range (mg/dL)  70-129 once went over 200s    Number of hypoglycemic episodes per month  0    Number of hyperglycemic episodes per week  1    Can you tell when your blood sugar is high?  No    Have you had a dilated eye exam in the past 12 months?  Yes    Have you had a dental exam in the past 12 months?  No    Are you checking your feet?  Yes    How many days per week are you checking your feet?  2      Dietary Intake   Breakfast  none OR sandwich or soup (not a full meal), coffee, cream & 1 T sugar     Snack (morning)  none    Lunch  none OR chicken noodle soup    Snack (afternoon)  none    Dinner  potato chips, pretzels, fruit     Beverage(s)  coffee, hot tea ginsing, chia adds sugar or splenda to make it sweet      Exercise   Exercise Type  ADL's    How many days per week to you exercise?  0    How  many minutes per day do you exercise?  0    Total minutes per week of exercise  0      Patient Education   Previous Diabetes Education  Yes (please comment) once in the 90's    Disease state   Definition of diabetes, type 1 and 2, and the diagnosis of diabetes    Nutrition management   Role of diet in the treatment of diabetes and the relationship between the three main macronutrients and blood glucose level;Carbohydrate counting;Food label reading, portion sizes and measuring food.    Physical activity and exercise   Role of exercise on diabetes management, blood pressure control and cardiac health.    Medications  Reviewed patients medication for diabetes, action, purpose, timing of dose and side effects.    Monitoring  Identified appropriate SMBG and/or A1C goals.    Acute complications  Taught treatment of hypoglycemia - the 15 rule.    Chronic complications  Relationship between chronic complications and blood glucose control;Assessed and discussed foot care and prevention of foot problems;Identified and discussed with patient  current chronic complications     Psychosocial adjustment  Role of stress on diabetes sleep      Individualized Goals (developed by patient)   Nutrition  General guidelines for healthy choices and portions discussed      Outcomes   Expected Outcomes  Demonstrated interest in learning. Expect positive outcomes    Future DMSE  4-6 wks    Program Status  Not Completed     Individualized Plan for Diabetes Self-Management Training:   Learning Objective:  Patient will have a greater understanding of diabetes self-management. Patient education plan is to attend individual and/or group sessions per assessed needs and concerns.  Patient Instructions  Plan:  Aim for 2-3 Carb Choices per meal (30-45 grams)   Aim for 0-1 Carb Choices per snack if hungry (0-30 grams) Include protein with your meals and snacks Aim for 3 balanced meals or snacks per day Consider increasing your activity level daily as tolerated Consider checking blood sugar 2 times per day, fasting and 2 hrs after a meal Continue taking medication as directed by MD Adequate and restful sleep is important for your health and controlling blood sugar Consider asking to have your vitamin D checked Consider taking a vitamin B12 for nerve health Consider wearing shoes all the time. Aim to have beans and fish on a regular basis.  Expected Outcomes:  Demonstrated interest in learning. Expect positive outcomes  Education material provided: Living Well with Diabetes, A1C conversion sheet, My Plate, Snack sheet and Carbohydrate counting sheet, Sleep Hygiene, therapist/counselor list,   If problems or questions, patient to contact team via:  Phone  Future DSME appointment: 4-6 wks

## 2017-07-23 NOTE — Progress Notes (Signed)
   Subjective   Patient ID: [name]    DOB: 09-Sep-1955, 62 y.o. female   MRN: [id number]  CC: "Smoking cessation"  HPI: [name] is a 62 y.o. female who presents to clinic today for the following:  Smoking: Patient initiated nicotine replacement several weeks ago with 14 mg patches with significant improvement of cravings.  Patient has smoked 1 cigarette over the course of the week 3 times usually with her coffee due to slightly worsened cravings.  Patient interested in adding supplemental nicotine coverage during this time.  Her husband has effectively discontinued smoking with Chantix.  Unfortunately she cannot take Chantix based on her insurance.  She denies shortness of breath, chest pain.  Diabetes: Patient monitoring postprandial averaging 203, no fastings recorded, no highs or lows.  Patient tolerating restarting metformin without side effects.  She has not increased from once to twice daily due to concern of possibly dropping her glucose levels.  She is currently taking Lantus 30 units daily at night.  She is not taking the [place].  Patient recently met with dietitian with follow-up in 1 month.  She is optimistic about eating healthy and has increased appetite since decreasing cigarette use with nicotine replacement.  She denies fevers or chills, nausea or vomiting, diarrhea, abdominal pain, change in vision, syncope.  ROS: see HPI for pertinent.  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 Surgical history TAH, LHC, iridectomy. Family history heart disease, DM, bipolar. Smoking status reviewed. Medications reviewed.  Objective   BP (!) 146/62   Pulse 86   Temp 98.4 F (36.9 C) (Oral)   Ht _0  (1.575 m)   Wt 149 lb 6.4 oz (67.8 kg)   SpO2 98%   BMI 27.33 kg/m  Vitals and nursing note reviewed.  General: well nourished, well developed, NAD with non-toxic  appearance HEENT: normocephalic, atraumatic, moist mucous membranes Neck: supple, non-tender without lymphadenopathy Cardiovascular: regular rate and rhythm without murmurs, rubs, or gallops Lungs: clear to auscultation bilaterally with normal work of breathing Skin: warm, dry, no rashes or lesions, cap refill < 2 seconds Extremities: warm and well perfused, normal tone, no edema  Psych: pleasant, euthymic mood, congruent affect  Assessment & Plan   Tobacco use disorder Chronic. Actively making efforts to cessation with minimal use. Seems to have nicotine craving in morning with coffee.  - Congratulated patient - Continue nicotine patch 14 mg daily and given nicotine gum 2 mg with instruction to take in morning prior to coffee - RTC in 1 month  DM (diabetes mellitus), type 2, uncontrolled (HCC) Chronic. Better control based on CBG avg 200 today. Last A1c 12.3. Patient eating more regularly since smoking cessation. Patient did not increase Metformin since restarting last visit. - Continue Lantus 30 mg daily, increase Metformin 500 mg twice daily, but hold [place] given controlled levels and no prior use - RTC 2 months for recheck of A1c  No orders of the defined types were placed in this encounter.  Meds ordered this encounter  Medications  . nicotine polacrilex (NICORETTE) 2 MG gum    Sig: Take 1 each (2 mg total) by mouth as needed for smoking cessation.    Dispense:  50 tablet    Refill:  [place], [place], PGY-2 07/27/2017, 2:37 PM

## 2017-07-24 ENCOUNTER — Encounter: Payer: Self-pay | Admitting: Family Medicine

## 2017-07-24 ENCOUNTER — Ambulatory Visit (INDEPENDENT_AMBULATORY_CARE_PROVIDER_SITE_OTHER): Payer: Medicare HMO | Admitting: Family Medicine

## 2017-07-24 ENCOUNTER — Other Ambulatory Visit: Payer: Self-pay

## 2017-07-24 VITALS — BP 146/62 | HR 86 | Temp 98.4°F | Ht 62.0 in | Wt 149.4 lb

## 2017-07-24 DIAGNOSIS — E1165 Type 2 diabetes mellitus with hyperglycemia: Secondary | ICD-10-CM

## 2017-07-24 DIAGNOSIS — R69 Illness, unspecified: Secondary | ICD-10-CM | POA: Diagnosis not present

## 2017-07-24 DIAGNOSIS — F17200 Nicotine dependence, unspecified, uncomplicated: Secondary | ICD-10-CM

## 2017-07-24 MED ORDER — NICOTINE POLACRILEX 2 MG MT GUM: 2.0000 mg | CHEWING_GUM | OROMUCOSAL | 3 refills | Status: DC | PRN

## 2017-07-24 NOTE — Patient Instructions (Signed)
Thank you for coming in to see [place] today. Please see below to review our plan for today's visit.  1.  I have given you the nicotine gum which she will chew prior to the onset of your craving.  Be sure to chew it and place it in between her cheek and gum until you feel a tingling sensation at which point we will need to chew again.  Continue using the patches.  I would like to see you again in 1 month to see how you are cessation is doing.  You are doing well!!! I am cheering you on!!! 2.  Increase her metformin to twice daily.  Metformin will not drop your blood glucose levels especially being that your appetite has improved.    Please call the clinic at [phone] if your symptoms worsen or you have any concerns. It was our pleasure to serve you.  Harriet Butte, [place], PGY-2

## 2017-07-24 NOTE — Assessment & Plan Note (Addendum)
Chronic. Better control based on CBG avg 200 today. Last A1c 12.3. Patient eating more regularly since smoking cessation. Patient did not increase Metformin since restarting last visit. - Continue Lantus 30 mg daily, increase Metformin 500 mg twice daily, but hold [place] given controlled levels and no prior use - RTC 2 months for recheck of A1c

## 2017-07-24 NOTE — Assessment & Plan Note (Addendum)
Chronic. Actively making efforts to cessation with minimal use. Seems to have nicotine craving in morning with coffee.  - Congratulated patient - Continue nicotine patch 14 mg daily and given nicotine gum 2 mg with instruction to take in morning prior to coffee - RTC in 1 month

## 2017-08-06 ENCOUNTER — Ambulatory Visit: Payer: Medicare HMO | Admitting: Pharmacist

## 2017-08-12 ENCOUNTER — Other Ambulatory Visit: Payer: Self-pay | Admitting: Family Medicine

## 2017-08-12 DIAGNOSIS — M545 Low back pain, unspecified: Secondary | ICD-10-CM

## 2017-08-13 ENCOUNTER — Encounter: Payer: Self-pay | Admitting: Pharmacist

## 2017-08-13 ENCOUNTER — Ambulatory Visit (INDEPENDENT_AMBULATORY_CARE_PROVIDER_SITE_OTHER): Payer: Medicare HMO | Admitting: Pharmacist

## 2017-08-13 DIAGNOSIS — F339 Major depressive disorder, recurrent, unspecified: Secondary | ICD-10-CM

## 2017-08-13 DIAGNOSIS — F17200 Nicotine dependence, unspecified, uncomplicated: Secondary | ICD-10-CM

## 2017-08-13 DIAGNOSIS — R69 Illness, unspecified: Secondary | ICD-10-CM | POA: Diagnosis not present

## 2017-08-13 MED ORDER — NORTRIPTYLINE HCL 25 MG PO CAPS: 25.0000 mg | ORAL_CAPSULE | Freq: Every day | ORAL | 3 refills | Status: DC

## 2017-08-13 MED ORDER — NICOTINE 14 MG/24HR TD PT24: 14.0000 mg | MEDICATED_PATCH | Freq: Every day | TRANSDERMAL | 0 refills | Status: DC

## 2017-08-13 MED ORDER — NICOTINE POLACRILEX 2 MG MT GUM: 2.0000 mg | CHEWING_GUM | OROMUCOSAL | 3 refills | Status: DC | PRN

## 2017-08-13 NOTE — Patient Instructions (Addendum)
Thanks for coming to see [place]! You have certainly improved.   1. Don't forget to pick up aspirin 81 mg. Take 1 tablet daily, as prescribed.   2. Restart nicotine patches 14 mg - one patch once a day. Take these off at bedtime to avoid vivid dreams.   3. Use the gum (park and chew) during times that you're tempted to smoke a cigarette. Try not to chew more than 5-6 pieces every day.   4. Start nortriptyline 25 mg at bedtime.   Come back to see [place] in 3 weeks.

## 2017-08-13 NOTE — Progress Notes (Signed)
   S:  Patient arrives in good spirits, presenting with her husband who is also known to Rx clinic. Patient arrives for evaluation/assistance with tobacco dependence.  Patient was referred on 07/24/17 by Primary Care Provider Dr. Yisroel Ramming. Patient with 21.5 pack year history reports quitting for two weeks during which she was wearing the 14 mg nicotine patch. She endorses relapsing due to spontaneous craving for cigarette with morning coffee. Patient reports prescription for nicotine patches was not sent to correct pharmacy and needs new prescription sent.    Reports "crazy" dreams while wearing 14 mg patch. Denies insomnia. Endorses smoking cigarette to manage stress and anxiety and complains of strong cravings while drinking morning coffee. States husband is major advocate in tobacco cessation and helps motivate her to quit.   Reports fluctuating stress levels and states that she was previously diagnosed with depression. States she previously took mirtazapine at higher dose to manage depressive symptoms but dose was lowered due to excessive somnolence. Reports waking up in the middle of the night due to worry over the death of family members and fears of her own mortality. Denies SI/HI.  Denies use of dapagliflozin, and voltaren gel due to intolerable side effects.  Age when started using tobacco on a daily basis 27. Number of Cigarettes per day 2-3. Brand smoked any menthols. Estimated Nicotine Content per Cigarette (mg) ~1.  Estimated Nicotine intake per day 3mg .   Smokes first cigarette 15 minutes after waking. Used to smoke when she first woke up before coffee Denies waking to smoke  Estimated Fagerstrom Score 4/10.   Most recent quit attempt last month Longest time ever been tobacco free - 2 weeks   Medications (NRT, bupropion, varenicline) used in prior in past cessation efforts include: patches - 14 mg patches.    Rates IMPORTANCE of quitting tobacco on 1-10 scale of 10 Rates  CONFIDENCE of quitting tobacco on 1-10 scale of 7  Most common triggers to use tobacco include; stress, coffee, boredom  Motivation to quit: health  A/P: Moderate Nicotine Dependence of 21.5 pack-years duration in a patient who is good candidate for success b/c of previous response to nicotine patches, spousal support, and high level of confidence.  Restarted nicotine replacement tx 14 mg patch once daily. Started nicotine gum 2 mg prn with maximum dose of 5-6 pieces per day.  Patient counseled on purpose, proper use, and potential adverse effects, including removing patches at night to reduce vivid dreams/nightmares.  Patient with previously diagnosed depression and reported anxiety currently uncontrolled per patient report of ruminations about death. Started nortriptyline 25 mg at bedtime. Patient counseled to stop taking mirtazapine once starting nortriptyline.  Advised to restart 81 mg aspirin daily for CVD prevention.   Written information provided.  F/U visit in 3 weeks.  F/U Rx Clinic Visit   Total time in face-to-face counseling 30 minutes.  Patient seen with Onnie Boer, PharmD Candidate, Leroy Libman, PharmD, and Deirdre Pippins, PGY2 Pharmacy Resident, PharmD, BCPS.  [name]

## 2017-08-13 NOTE — Assessment & Plan Note (Signed)
History of depression and anxiety currently uncontrolled per patient report of ruminations about death. Changed from mirtazepine to nortriptyline 25 mg at bedtime which may help with tobacco cessation.  Patient counseled to stop taking mirtazapine once starting nortriptyline.

## 2017-08-13 NOTE — Progress Notes (Signed)
Patient ID: [name], female   DOB: 05/06/1956, 62 y.o.   MRN: [id number] Reviewed: Agree with Dr. Graylin Shiver documentation and management.

## 2017-08-13 NOTE — Assessment & Plan Note (Signed)
Moderate Nicotine Dependence of 21.5 pack-years duration in a patient who is good candidate for success b/c of previous response to nicotine patches, spousal support, and high level of confidence.  Restarted nicotine replacement tx 14 mg patch once daily. Started nicotine gum 2 mg prn with maximum dose of 5-6 pieces per day.  Patient counseled on purpose, proper use, and potential adverse effects, including removing patches at night to reduce vivid dreams/nightmares.  Patient with previously diagnosed depression and reported anxiety currently uncontrolled per patient report of ruminations about death. Started nortriptyline 25 mg at bedtime. Patient counseled to stop taking mirtazapine once starting nortriptyline.

## 2017-08-20 ENCOUNTER — Other Ambulatory Visit: Payer: Self-pay | Admitting: Family Medicine

## 2017-08-20 DIAGNOSIS — M545 Low back pain, unspecified: Secondary | ICD-10-CM

## 2017-08-21 ENCOUNTER — Telehealth: Payer: Self-pay | Admitting: Family Medicine

## 2017-08-21 NOTE — Telephone Encounter (Signed)
Not an active med on her list. Will forward to MD. Clinton Sawyer, Salome Spotted, [place]

## 2017-08-21 NOTE — Telephone Encounter (Signed)
Needs refill on tizandine.  [facility] on [address]

## 2017-08-25 ENCOUNTER — Encounter: Payer: Self-pay | Admitting: Licensed Clinical Social Worker

## 2017-08-25 ENCOUNTER — Ambulatory Visit (INDEPENDENT_AMBULATORY_CARE_PROVIDER_SITE_OTHER): Payer: Medicare HMO | Admitting: Family Medicine

## 2017-08-25 ENCOUNTER — Other Ambulatory Visit: Payer: Self-pay

## 2017-08-25 ENCOUNTER — Encounter: Payer: Self-pay | Admitting: Family Medicine

## 2017-08-25 VITALS — BP 154/68 | HR 103 | Temp 98.3°F | Ht 62.0 in | Wt 146.8 lb

## 2017-08-25 DIAGNOSIS — R69 Illness, unspecified: Secondary | ICD-10-CM | POA: Diagnosis not present

## 2017-08-25 DIAGNOSIS — F17200 Nicotine dependence, unspecified, uncomplicated: Secondary | ICD-10-CM

## 2017-08-25 DIAGNOSIS — E1165 Type 2 diabetes mellitus with hyperglycemia: Secondary | ICD-10-CM

## 2017-08-25 NOTE — Assessment & Plan Note (Signed)
Chronic.  Patient was doing well with nicotine patches but seems to be back in relapse.  This seems to be factorial primarily with multiple comorbidities and increased stress levels.  Her history of bipolar make this more difficult.  Patient agreeable to meet with behavioral health. - Patient agreeable to meet with behavioral health who will work on reestablishing patient with psychiatry to discuss bipolar and consideration of mood stabilizer - Advised patient to call 1-800-QUIT-NOW for free resources but hold off on smoking cessation until her current stress has improved

## 2017-08-25 NOTE — Progress Notes (Signed)
   Subjective   Patient ID: [name]    DOB: [birth year], 62 y.o. female   MRN: [id number]  CC: "Smoking follow-up"  HPI: [name] is a 62 y.o. female who presents to clinic today for the following:  Smoking: Patient was doing well up until the last few weeks at which point she began to smoke again.  Patient states the reason she began smoking was "several different things."  She did seem to endorse more of issues with her concern for chronic medical issues and inability to afford her nicotine patches with the insurance.  She continues to be without cough, change in weight, shortness of breath or chest pain.  Diabetes: Patient endorses CBGs ranging mid to high 100s-200s with occasional 300 or 400.  She denies any hypoglycemia but endorses good understanding.  Patient states she began to eat last since smoking again.  She continues to take Lantus 30 units daily along with her metformin.  ROS: see HPI for pertinent.  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 Family history heart disease, DM, bipolar.Smoking status reviewed. Medications reviewed.  Objective   BP (!) 154/68   Pulse (!) 103   Temp 98.3 F (36.8 C) (Oral)   Ht 5\' 2"  (1.575 m)   Wt 146 lb 12.8 oz (66.6 kg)   SpO2 99%   BMI 26.85 kg/m  Vitals and nursing note reviewed.  General: well nourished, well developed, NAD with non-toxic appearance HEENT: normocephalic, atraumatic, moist mucous membranes Neck: supple, non-tender without lymphadenopathy Cardiovascular: regular rate and rhythm without murmurs, rubs, or gallops Lungs: clear to auscultation bilaterally with normal work of breathing Abdomen: soft, non-tender, non-distended, normoactive bowel sounds Skin: warm, dry, no rashes or lesions, cap refill < 2 seconds Extremities: warm and well perfused, normal  tone, no edema Psych: disthymic mood, congruent affect  Assessment & Plan   DM (diabetes mellitus), type 2, uncontrolled (HCC) Chronic.  History of labile CBGs with hypoglycemia.  This is likely due to poor nutrition.  Patient continues to use metformin and Lantus at 30 units daily.  Patient expresses good understanding of how to treat hypoglycemia.  She is agreeable to using supplement shakes. - Advised to continue Lantus 30 units given persistent hyperglycemia with fasting CBGs - Continue metformin 1000 mg twice daily - Advised patient to continue using Glucerna shakes to supplement and eat foods she finds more tasteful  Tobacco use disorder Chronic.  Patient was doing well with nicotine patches but seems to be back in relapse.  This seems to be factorial primarily with multiple comorbidities and increased stress levels.  Her history of bipolar make this more difficult.  Patient agreeable to meet with behavioral health. - Patient agreeable to meet with behavioral health who will work on reestablishing patient with psychiatry to discuss bipolar and consideration of mood stabilizer - Advised patient to call 1-800-QUIT-NOW for free resources but hold off on smoking cessation until her current stress has improved  No orders of the defined types were placed in this encounter.  No orders of the defined types were placed in this encounter.   Harriet Butte, [place], PGY-2 08/25/2017, 5:36 PM

## 2017-08-25 NOTE — Progress Notes (Signed)
ESTIMATE TIME:40 minutes Type of Service: [place] warm handoff  Interpreter:No.   [name] is a 62 y.o. female referred by Dr. Yisroel Ramming for: symptoms of  depression, concerns with smoking and poor appetite.   Patient is guarded with shades on and not wanting to talk with LCSW.  After engaging patient she removed her shades and open up to talk.  Patient reports :wanting to manage her mental health, would like to stop smoking and wants to feel better overall. Duration of problem: on and off for years but noticed decline in the last 2 months. This is also time frame that she stopped going to therapy at [facility], denies interpersonal violence, Patient has hx of mental heath treatment.[facility] had 4 therapy sessions, and [facility] many years ago for medication management.  Patient has not been on mental health medication for several years and is now interested in starting back.  Impact: does not feel like doing anything . Appearance:Guarded ; Affect: Tearful at time : No plan to harm self or others  GOALS: Patient will reduce symptoms of: anxiety, depression and mood instability, and increase  ability UL:AGTXMI skills, healthy habits and self-management skills, . INTERVENTION:  Solution-Focused Strategies and Supportive Counseling, Reflective listening, Behavioral Therapy (Relaxed breathing), Referral to [place] provider   ISSUES DISCUSSED: Integrated care services, support system, previous and current coping skills, community resources , LCSW assisted patient with making several call to her insurance company and to mental health provides to establish care.    ASSESSMENT:Patient currently experiencing symptoms of depression related to previous mental health diagnosis. Symptoms seem to increase after she was no longer able to continue therapy at [facility] once her Medicaid stopped.   Patient may benefit from, and is in agreement to receive further assessment  and therapeutic interventions for medication management and therapy to assist with managing her symptoms.  Patient will meet with LCSW to assist with coordinating care and for brief interventions until fully connected for ongoing mental health services.  PLAN:   1.Patient will F/U with LCSW in one week   2. LCSW will F/U with phone call in 5 to 7 days  3. Behavioral recommendations: relaxed breathing  4. Referral*[facility] (LME/Outside Clinic), walk-in appointment at [place] Completed.     [name], LCSW Licensed Clinical Social Worker [place]   [phone] 9:30 AM

## 2017-08-25 NOTE — Assessment & Plan Note (Signed)
Chronic.  History of labile CBGs with hypoglycemia.  This is likely due to poor nutrition.  Patient continues to use metformin and Lantus at 30 units daily.  Patient expresses good understanding of how to treat hypoglycemia.  She is agreeable to using supplement shakes. - Advised to continue Lantus 30 units given persistent hyperglycemia with fasting CBGs - Continue metformin 1000 mg twice daily - Advised patient to continue using Glucerna shakes to supplement and eat foods she finds more tasteful

## 2017-08-25 NOTE — Patient Instructions (Signed)
Thank you for coming in to see [place] today. Please see below to review our plan for today's visit.   I would like you to hold off on smoking cessation until we were able to deal with her other life stresses for this time.  You can continue your current diabetes regimen but be careful not to continue the current Lantus dose if your fasting blood sugars begin to drop.  We will see you again in 2 weeks.  Please call the clinic at [phone] if your symptoms worsen or you have any concerns. It was our pleasure to serve you.  Harriet Butte, [place], PGY-2

## 2017-08-27 ENCOUNTER — Other Ambulatory Visit: Payer: Self-pay | Admitting: Family Medicine

## 2017-08-27 DIAGNOSIS — E1165 Type 2 diabetes mellitus with hyperglycemia: Secondary | ICD-10-CM

## 2017-08-27 MED ORDER — METFORMIN HCL 500 MG PO TABS: ORAL_TABLET | ORAL | 3 refills | Status: DC

## 2017-09-01 ENCOUNTER — Telehealth: Payer: Self-pay | Admitting: Licensed Clinical Social Worker

## 2017-09-01 NOTE — Progress Notes (Signed)
Service : [place] F/U Call   F/U call to patient reference interventions discussed and resources provided during warm handoff from PCP.  Left voice message to call LCSW . Plan: LCSW will wait for return call, if no return call is received will plan to meet with patient after appointment with Dr. Valentina Lucks this week.   [name], LCSW Licensed Clinical Social Worker [place]   [phone] 1:26 PM

## 2017-09-02 ENCOUNTER — Telehealth: Payer: Self-pay

## 2017-09-02 ENCOUNTER — Ambulatory Visit (INDEPENDENT_AMBULATORY_CARE_PROVIDER_SITE_OTHER): Payer: Medicare HMO | Admitting: Family Medicine

## 2017-09-02 ENCOUNTER — Encounter: Payer: Medicare HMO | Attending: Medical | Admitting: Registered"

## 2017-09-02 VITALS — BP 160/82 | HR 113 | Temp 97.6°F

## 2017-09-02 DIAGNOSIS — E1165 Type 2 diabetes mellitus with hyperglycemia: Secondary | ICD-10-CM | POA: Insufficient documentation

## 2017-09-02 DIAGNOSIS — Z713 Dietary counseling and surveillance: Secondary | ICD-10-CM | POA: Insufficient documentation

## 2017-09-02 LAB — GLUCOSE, POCT (MANUAL RESULT ENTRY)
POC GLUCOSE: 364 mg/dL — AB (ref 70–99)
POC Glucose: 429 mg/dl — AB (ref 70–99)

## 2017-09-02 MED ORDER — INSULIN ASPART 100 UNIT/ML ~~LOC~~ SOLN: 8.0000 [IU] | Freq: Three times a day (TID) | SUBCUTANEOUS | 11 refills | Status: DC

## 2017-09-02 NOTE — Progress Notes (Signed)
When patient arrived for her diabetes education follow-up visit, she reported feeling light headed. Patient states she checked her blood sugar about 90 minutes prior and it was 480 mg/dL so she took 35 units of Lantus and ate a sandwich. Patient states she does not understand how to use the insulin to manage her diabetes.   POC glucose in office was 500 mg/dL at 12:00. RD called her primary care doctor at [facility], the practice was closed for lunch and a message was left for Dr. Yisroel Ramming updating him on the situation. Patient was advised to seek immediate attention and we offered to call 911 to have her evaluated. Patient states she wanted to wait until her doctor's office opened after lunch and just go there. Patient's son returned to the office to take patient to [facility]. NDES supervisor was notified of situation. Safety Zone Portal submitted.  While patient was cleaning her finger preparing for the POC glucose test, RD asking falls/depression screening questions and patient reports having daily depression over the last 2 weeks. Patient states she has been working with one of the social workers at the hospital to find a therapist to help with her depression and [facility] was the only place she has found so far. Patient states she has been seen at [facility] before and they have her past records, but she states they will not work for her because they are walk-in only and will not make appointments. RD provided patient with a printout from a webpage that lists free and reduced costs mental health resources.  [email or web address]  No charge for visit. Will reschedule appointment.

## 2017-09-02 NOTE — Patient Instructions (Signed)
It was a pleasure to see you today! Thank you for choosing [facility] for your primary care. [name] was seen for high sugar.   Our plans for today were:  Go home and prepare your meal. Take 8U of the novolog pen that we sent you home with right before you eat. You need to take this medicine to keep your sugar from going too high tonight.   Come back to see Dr. Valentina Lucks tomorrow and bring all of your medicines.   Best,  Dr. Lindell Noe

## 2017-09-02 NOTE — Progress Notes (Signed)
   CC: hyperglycemia same day  HPI Patient was at diabetic nutrition office appointment today when she noted that she was feeling lightheaded.  They checked a CBG which was elevated to 500, and sent her here to be seen.  She took 35 units of Lantus at home prior to arriving to this appointment by about an hour and a half. States she has a lot of trouble with decreased appetite and intake.   CBGs while waiting here today - 429, then 364. She also notes she has been leaving her insulin vial in her windowsill since getting it out of the fridge. She states she only ate once today so far and is planning dinner at ~7pm. She denies N/V. Does endorse increased urination, but states she has been drinking lots of water today to try to get her sugar down. Denies HA or recent URI.   ROS: denies CP, SOB, abd pain, dysuria, changes in BM.   CC, SH/smoking status, and VS noted  Objective: BP (!) 160/82 (BP Location: Right Arm, Patient Position: Sitting, Cuff Size: Normal)   Pulse (!) 113   Temp 97.6 F (36.4 C) (Oral)   SpO2 99%  Gen: NAD, alert, cooperative, and pleasant. HEENT: NCAT, EOMI, PERRL CV: RRR, no murmur Resp: CTAB, no wheezes, non-labored Ext: No edema, warm Neuro: Alert and oriented, Speech clear, No gross deficits  Assessment and plan:  Hyperglycemia: discussed with Dr. Valentina Lucks. No signs or symptoms of DKA or HHS. CBG coming down appropriately 2/2 lantus earlier today. Patient given novolog pen sample to take home today and take 8U with dinner. She was very concerned she would drop low, told her she could hold this dose if CBG before dinner was <150. To f/u with Dr. Valentina Lucks tomorrow.   Orders Placed This Encounter  Procedures  . Glucose (CBG)  . POCT glucose (manual entry)    Meds ordered this encounter  Medications  . DISCONTD: insulin aspart (NOVOLOG) 100 UNIT/ML injection    Sig: Inject 8 Units into the skin 3 (three) times daily before meals.    Dispense:  10 mL    Refill:  11     Order Specific Question:   Lot Number?    Answer:   [id number]    Order Specific Question:   Expiration Date?    Answer:   01/13/2018    Order Specific Question:   [place]    Answer:   [id number] [[id number]]    Order Specific Question:   Quantity    Answer:   1     Ralene Ok, MD, PGY2 09/03/2017 4:48 PM

## 2017-09-02 NOTE — Telephone Encounter (Signed)
[place]- Diabetes educator called to inform MD patient was there today and had blood sugar of over 450.  Patients phone [phone] [facility] number for any questions [phone] Will forward to MD  Wallace Cullens, RN

## 2017-09-03 ENCOUNTER — Ambulatory Visit (INDEPENDENT_AMBULATORY_CARE_PROVIDER_SITE_OTHER): Payer: Medicare HMO | Admitting: Pharmacist

## 2017-09-03 ENCOUNTER — Encounter: Payer: Self-pay | Admitting: Pharmacist

## 2017-09-03 ENCOUNTER — Ambulatory Visit: Payer: Medicare HMO | Admitting: Licensed Clinical Social Worker

## 2017-09-03 DIAGNOSIS — I10 Essential (primary) hypertension: Secondary | ICD-10-CM | POA: Diagnosis not present

## 2017-09-03 DIAGNOSIS — F17200 Nicotine dependence, unspecified, uncomplicated: Secondary | ICD-10-CM

## 2017-09-03 DIAGNOSIS — R69 Illness, unspecified: Secondary | ICD-10-CM | POA: Diagnosis not present

## 2017-09-03 DIAGNOSIS — Z658 Other specified problems related to psychosocial circumstances: Secondary | ICD-10-CM

## 2017-09-03 DIAGNOSIS — E1165 Type 2 diabetes mellitus with hyperglycemia: Secondary | ICD-10-CM

## 2017-09-03 MED ORDER — INSULIN DEGLUDEC 100 UNIT/ML ~~LOC~~ SOPN: 30.0000 [IU] | PEN_INJECTOR | Freq: Every day | SUBCUTANEOUS | Status: DC

## 2017-09-03 MED ORDER — INSULIN ASPART 100 UNIT/ML ~~LOC~~ SOLN: 2.0000 [IU] | Freq: Two times a day (BID) | SUBCUTANEOUS | Status: DC

## 2017-09-03 MED ORDER — CYCLOBENZAPRINE HCL 10 MG PO TABS: 10.0000 mg | ORAL_TABLET | Freq: Every day | ORAL | 0 refills | Status: DC | PRN

## 2017-09-03 MED ORDER — TELMISARTAN-HCTZ 80-12.5 MG PO TABS: 1.0000 | ORAL_TABLET | Freq: Every day | ORAL | 3 refills | Status: DC

## 2017-09-03 MED ORDER — CYCLOBENZAPRINE HCL 10 MG PO TABS: 10.0000 mg | ORAL_TABLET | Freq: Two times a day (BID) | ORAL | 0 refills | Status: DC | PRN

## 2017-09-03 NOTE — Assessment & Plan Note (Signed)
Hypertension longstanding currently controlled however the dry cough presented by the patient likely due to an intolerance to lisinopril. Discontinued lisinopril 40 mg / HCTZ 12.5 mg once daily. Intiated telmisartan 80 mg / HCTZ 12.5 mg tablets once daily by mouth. ARBs have a lower incidence of cough than ACE inhibitors. Telmisartan is one of the ARBs that was least impacted by the Nitrosodiethylamine (NDEA) recall (most affected are losartan/valsartan). Patient reports adherence with medication.

## 2017-09-03 NOTE — Patient Instructions (Addendum)
Thank you for coming to the clinic today Mrs. Bowie!  Also thank you for bringing in your blood glucose meter in with you today and for working hard to check your sugars everyday!  We will make sure to change your long acting insulin (Lantus) to whichever insulin your insurance covers [place] Tyler Aas). When we find out which long acting insulin your insurance covers, we will switch to that medication.   Dr. Valentina Lucks would like to change your medications: - For your Novolog (short acting/meal time insulin) take before you eat: 2 units - if your blood sugar is > 100-220 mg/dL 4 units - if you blood sugar is 221-300 mg/dL 6 units -  if you blood sugar is > 300 mg/dL This will replace you taking the 8 units of Novolog a day.   -For your Lantus: continue at your home dose at 30 units/day.   -Stop your lisinopril / hydrochlorothiazide 20/12.5 mg tablets due to your cough.   -Start a new medication called telmisartan  / hydrochlorothiazide 80/12.5 mg tablets once daily by mouth.   Remember to follow up with Dr. Valentina Lucks next week and please bring your meter!

## 2017-09-03 NOTE — Assessment & Plan Note (Signed)
Continues with tobacco cessation plan.  Target quit date is 09/14/2017.  Plans to use bupropion, patch and gum.

## 2017-09-03 NOTE — Progress Notes (Signed)
Type of Service: [place] 2nd F/U Visit Total time:30 minutes :  Interpreter:No.    Reason for follow-up: Continue brief intervention to assist patient with managing symptoms of depression, as well as stressors . Reports good and bad days in daily functions. Patient is pleasant, smiling and engaged in conversation.   Appearance:Neat ; Thought process: Coherent; Affect: Appropriate  Patient states she feels better today.  She has not gone to [facility] for mood medication.  Was last on medication about 2 years ago.  Patient reports she has managed well over the past 2 years and would like to continue without medication.  Goals: Patient will reduce symptoms of: mood instability , and increase ability KP:[id number] habits and stress reduction,  Intervention: Motivational Interviewing and Link to [employer], Problem-solving teaching/coping strategies, Psychoeducation and Referral to Counselor/Psychotherapist as well as solution focus intervention. Issues discussed: support system ; lack of appetite ;  Glucerna supplement sample provided ; review of providers for ongoing therapy ; barriers to going to [facility] or psychotherapy; and  IBH at [place] is feeling better today, she has hx of chronic mood disorder when she wants to manage via counseling.  Her main concern is managing uncontrol diabetes and smoking. She believes she will feel better mentally once A1C is controlled.  Patient is meeting with Dr. Valentina Lucks to assist with management . Patient may benefit from, and is in agreement to contact mental health provider for further assessment and ongoing therapeutic interventions to assist with managing her mood.  Plan: 1. Patient will F/U with LCSW if needed  2.  Referral: Counselor for ongoing therapy. 3. Patient will call providers she selected from the list today.  [name], LCSW Licensed Clinical Social Worker [facility]   [phone] 2:31 PM

## 2017-09-03 NOTE — Progress Notes (Signed)
S:     Chief Complaint  Patient presents with  . Medication Management    diabetes, tobacco cessation    Patient arrives in good spirits ambulating without assistance with her husband.  Presents for diabetes evaluation, education, and management at the request of Dr. Lindell Noe. Patient was referred on 09/02/17.  Patient was last seen by Primary Care Provider on 09/02/17.   Patient reports Diabetes was diagnosed prior to 2010.   Patient reports adherence with medications.  Current diabetes medications include: metformin 500 mg twice daily, Lantus 30 units once daily, and Novolog 8 units once a day with largest meal.  Current hypertension medications include: lisinopril-hydrochlorothiazide 20/12.5 mg and metoprolol succinate 50 mg once daily,   Per the patient provided documentation, her health insurance [employer]hist[employer]ibility and immunogenetics)) will no longer cover Lantus going forward.   Patient reports hypoglycemic events. There are multiple instances in which her blood glucose meter log showed blood sugars < 70 mg/dL but none < 50 mg/dL. Patient attributes these hypoglycemic episodes to not eating but still taking Lantus.   Patient also reports a dry hacking cough that will not go away that has lasted for a long duration. Denies sputum production, fever, or other s/sx of an URI.    Patient also reports a smoking cessation start date of April 1st and has already received nicotine gum, patches (14 mg), and is in contact with a quitting coach.   Patient reported dietary habits: Eats 1-2 meals/day Breakfast: None.  Lunch: Sandwhich but most of the time none due to lack of appetite. Dinner: Soup. Drinks: Soda, Grape/Apple/Fruit cocktail juice.   O:  Physical Exam  Constitutional: She appears well-developed and well-nourished.  Vitals reviewed.   Review of Systems  Respiratory: Positive for cough.   Chronic, dry hacking and non-productive  Lab Results  Component Value Date   HGBA1C 12.3 06/25/2017   There  were no vitals filed for this visit.  Home fasting CBG: multiple < 80 per her reports however  2 hour post-prandial/random CBG: 204-567 mg/dL.  10 year ASCVD risk: 40.5%. Once the patient quits smoking for 6 mo the risk will drop to 22.9%.   A/P: Diabetes longstanding currently uncontrolled. Patient reports hypoglycemic events and is able to verbalize appropriate hypoglycemia management plan. Patient reports adherence with medication. Control is suboptimal due to unpredictable food intake/missed meals and an inability to titrate up both long acting and short acting insulin doses due to hypoglycemic events.  Continued basal insulin Lantus (insulin glargine) at home dose of 30 units/day. Patient was not interested in decreasing dose today given recent high readings.  Adjusted dose of rapid insulin Novolog (insulin aspart) to 2- 6 units two times daily depending on blood glucose readings (sliding scale insulin). Discontinued bolus insulin regimen of 8 units with meals due to inconsistent meal times. In the near future, will change Lantus 30 units/day to [place] due to insurance formulary preference/coverage. Next A1C anticipated April 2019.   Asked to call our office with readings < 100 so we could decrease her Lantus dose.  She agreed with this plan.   ASCVD risk greater than 7.5%. Plan to restart Aspirin 81 mg and Continued atorvastatin 40 mg.   Hypertension longstanding currently controlled however the dry cough presented by the patient likely due to an intolerance to lisinopril. Discontinued lisinopril 40 mg / HCTZ 12.5 mg once daily. Intiated telmisartan 80 mg / HCTZ 12.5 mg tablets once daily by mouth. ARBs have a lower incidence of cough than  ACE inhibitors. Telmisartan is one of the ARBs that was least impacted by the Nitrosodiethylamine (NDEA) recall (most affected are losartan/valsartan). Patient reports adherence with medication.   Written patient instructions provided.  Total time in face to  face counseling 60 minutes.   Follow up in Pharmacist Clinic Visit in one week.   Patient seen with Hildred Alamin, PharmD Candidate.

## 2017-09-03 NOTE — Assessment & Plan Note (Signed)
Diabetes longstanding currently uncontrolled. Patient reports hypoglycemic events and is able to verbalize appropriate hypoglycemia management plan. Patient reports adherence with medication. Control is suboptimal due to unpredictable food intake/missed meals and an inability to titrate up both long acting and short acting insulin doses due to hypoglycemic events.  Continued basal insulin Lantus (insulin glargine) at home dose of 30 units/day. Patient was not interested in decreasing dose today given recent high readings.  Adjusted dose of rapid insulin Novolog (insulin aspart) to 2- 6 units two times daily depending on blood glucose readings (sliding scale insulin). Discontinued bolus insulin regimen of 8 units with meals due to inconsistent meal times. In the near future, will change Lantus 30 units/day to [place] due to insurance formulary preference/coverage. Next A1C anticipated April 2019.   Asked to call our office with readings < 100 so we could decrease her Lantus dose.  She agreed with this plan.

## 2017-09-04 NOTE — Progress Notes (Signed)
Patient ID: [name], female   DOB: 02/13/1956, 62 y.o.   MRN: [id number] Reviewed: Agree with Dr. Graylin Shiver documentation and management.

## 2017-09-05 DIAGNOSIS — R69 Illness, unspecified: Secondary | ICD-10-CM | POA: Diagnosis not present

## 2017-09-08 ENCOUNTER — Telehealth: Payer: Self-pay | Admitting: Registered"

## 2017-09-08 NOTE — Telephone Encounter (Signed)
I spoke with the patient to follow up on the incident at [place] last week. The patient states she is doing better and needed to make a follow up appointment. I have scheduled a follow up appointment for April 18th at 8am.

## 2017-09-10 ENCOUNTER — Encounter: Payer: Self-pay | Admitting: Pharmacist

## 2017-09-10 ENCOUNTER — Ambulatory Visit (INDEPENDENT_AMBULATORY_CARE_PROVIDER_SITE_OTHER): Payer: Medicare HMO | Admitting: Pharmacist

## 2017-09-10 DIAGNOSIS — E1142 Type 2 diabetes mellitus with diabetic polyneuropathy: Secondary | ICD-10-CM | POA: Diagnosis not present

## 2017-09-10 DIAGNOSIS — E1165 Type 2 diabetes mellitus with hyperglycemia: Secondary | ICD-10-CM | POA: Diagnosis not present

## 2017-09-10 DIAGNOSIS — Z794 Long term (current) use of insulin: Secondary | ICD-10-CM | POA: Diagnosis not present

## 2017-09-10 DIAGNOSIS — I10 Essential (primary) hypertension: Secondary | ICD-10-CM | POA: Diagnosis not present

## 2017-09-10 DIAGNOSIS — F17200 Nicotine dependence, unspecified, uncomplicated: Secondary | ICD-10-CM

## 2017-09-10 DIAGNOSIS — IMO0002 Reserved for concepts with insufficient information to code with codable children: Secondary | ICD-10-CM

## 2017-09-10 DIAGNOSIS — R69 Illness, unspecified: Secondary | ICD-10-CM | POA: Diagnosis not present

## 2017-09-10 MED ORDER — TELMISARTAN-HCTZ 80-12.5 MG PO TABS: 1.0000 | ORAL_TABLET | Freq: Every day | ORAL | 3 refills | Status: DC

## 2017-09-10 MED ORDER — INSULIN DEGLUDEC 100 UNIT/ML ~~LOC~~ SOPN: 26.0000 [IU] | PEN_INJECTOR | Freq: Every day | SUBCUTANEOUS | 3 refills | Status: DC

## 2017-09-10 MED ORDER — INSULIN ASPART 100 UNIT/ML ~~LOC~~ SOLN: 4.0000 [IU] | Freq: Two times a day (BID) | SUBCUTANEOUS | Status: DC

## 2017-09-10 NOTE — Assessment & Plan Note (Signed)
Hypertension longstanding currently controlled. Recently discontinued lisinopril 40 mg / HCTZ 12.5 mg once daily and intiated telmisartan 80 mg / HCTZ 12.5 mg tablets once daily by mouth as patient reported a dry cough with the ACEi. Counseled patient to change her telmisartan 80 mg / HCTZ 12.5 mg tablets to bedtime administration due to patient report of feeling drowsy since starting this medication.

## 2017-09-10 NOTE — Assessment & Plan Note (Signed)
Diabetes longstanding currently with better control with medications, but could benefit from further control with diet and therapy. Patient reports hypoglycemic events and is able to verbalize appropriate hypoglycemia management plan, counseled in depth with several recent hypoglycemic events reported in food log. Patient reports adherence with medication. Control is suboptimal due to erratic diet and unhealthy food choices at many meals.  Discontinued basal insulin Tresiba (insulin degludec) to 26 units in the morning. Patient counseled to finish supply of Lantus and then begin using the [place] with change in therapy due to insurance coverage. A new prescription for Tyler Aas was sent to the patient's pharmacy.  Increased dose of rapid insulin Novolog (insulin aspart) to 4, 6, and 8 units with meals depending on blood glucose reading (sliding scale insulin). Patient to continue taking metformin 500mg  BID. Patient counseled to begin using Glucerna shakes as meal replacement if she would otherwise skip a meal. Patient counseled that if blood sugar is below 100 and patient only having Glucerna for a meal to not use short-acting insulin at that time. Patient is to take her blood sugar in these cases and I will follow up at the next appointment to see if she would be able to tolerate additional sliding scale coverage based on her blood glucose. Next A1C anticipated April 2019.

## 2017-09-10 NOTE — Assessment & Plan Note (Signed)
Instructed patient to call us if she does not receive her nicotine patches in the mail before her quit date. Patient also plans to use gum that she has already purchased. Plan in place for her counselor to call her on quit date and continue to follow-up routinely. Will follow up with plan to continue or discontinue bupropion at the next visit.

## 2017-09-10 NOTE — Patient Instructions (Addendum)
Thanks for coming to see [place] today. We are so proud of how far you have come. We're looking forward to April 1 for you to quit smoking!!!!!   1. Decrease your Lantus to 26 units in the morning. When you run out of Lantus, go pick up the [place] and take 26 units in the morning.   2. Increase your Novolog to the following scale:  Blood sugar 100-200 - give 4 units Blood sugar 201-300 - give 6 units Blood sugar 301 or higher - give 8 units If your blood sugar is less than 100 and you are just having a glucerna, you can skip the insulin.   3. Try to have a glucerna when you are not hungry enough for a full meal.   Come back to see [place] in ~3-4 weeks. Call if you continue to have low blood sugars.

## 2017-09-10 NOTE — Progress Notes (Signed)
S:  Patient arrives self-ambulating in high spirits. She is accompanied by her partner. Presents for diabetes evaluation, education, and management at the request of Dr. Lindell Noe. Patient was referred on 09/02/17.  Patient was last seen by Primary Care Provider on 09/02/17. Patient reports she feels that she is doing much better currently with the control of her diabetes, but does endorse that there is room for improvement. Patient reports that she still does not have much of an appetite and knows that she can do better with her dietary choices when she does have a meal. Patient has an upcoming appointment with a dietician and feels she will better understand how to make healthy choices in food and to eat more consistently throughout the day. Patient often only eats only 1-2 meals/day. Patient endorses using coffee with sugar when she is hypoglycemic in the mornings. Patient complains of feeling tired and "just can't wake up" since starting the new blood pressure medication. Patient reports she quit smoking x 1 week and then started to smoke 1/2 ppd again. Patient has set a quit date of April 1 and plans to use gum and patches to assist with smoking cessation. She reports her confidence in her ability to quit smoking is at a 10 right now. Her partner has already quit smoking and this is motivating her further. Patient reports that the bupropion is not helping with her cravings for cigarettes at this time, but that she would like to continue the medication until after her stop date to see if the medication helps at all by then.   Patient reports adherence with medications.  Current diabetes medications include: metformin 500 mg twice daily, Lantus 30 units once daily, and Novolog sliding scale Current hypertension medications include: telmisartan 80 mg / HCTZ 12.5 mg daily, metoprolol succinate 50mg  daily   Patient reports hypoglycemic events. Patient reports feeling hypoglycemic most in the  mornings.  Patient reported dietary habits: Eats 1-2 meals/day--inconsistent with amount of food intake and times of food intake throughout the day   Patient denies nocturia. Patient denies frequency with HCTZ.   O:   Lab Results  Component Value Date   HGBA1C 12.3 06/25/2017   Vitals:   09/10/17 1405  BP: 130/72  Pulse: (!) 124  SpO2: 98%   Per patient log and glucometer: many hypoglycemic events in the mornings, then many numbers over 200-300 in the afternoon and evening, some readings in the 100s   A/P: Diabetes Diabetes longstanding currently with better control with medications, but could benefit from further control with diet and therapy. Patient reports hypoglycemic events and is able to verbalize appropriate hypoglycemia management plan, counseled in depth with several recent hypoglycemic events reported in food log. Patient reports adherence with medication. Control is suboptimal due to erratic diet and unhealthy food choices at many meals.  Discontinued basal insulin Tresiba (insulin degludec) to 26 units in the morning. Patient counseled to finish supply of Lantus and then begin using the [place] with change in therapy due to insurance coverage. A new prescription for Tyler Aas was sent to the patient's pharmacy.  Increased dose of rapid insulin Novolog (insulin aspart) to 4, 6, and 8 units with meals depending on blood glucose reading (sliding scale insulin). Patient to continue taking metformin 500mg  BID. Patient counseled to begin using Glucerna shakes as meal replacement if she would otherwise skip a meal. Patient counseled that if blood sugar is below 100 and patient only having Glucerna for a meal to not use  short-acting insulin at that time. Patient is to take her blood sugar in these cases and I will follow up at the next appointment to see if she would be able to tolerate additional sliding scale coverage based on her blood glucose. Next A1C anticipated April 2019.    ASCVD  risk greater than 7.5%. Continued Aspirin 81 mg and continued atorvastatin 40 mg.   Hypertension Hypertension longstanding currently controlled. Recently discontinued lisinopril 40 mg / HCTZ 12.5 mg once daily and intiated telmisartan 80 mg / HCTZ 12.5 mg tablets once daily by mouth as patient reported a dry cough with the ACEi. Counseled patient to change her telmisartan 80 mg / HCTZ 12.5 mg tablets to bedtime administration due to patient report of feeling drowsy since starting this medication.  Smoking cessation Instructed patient to call us if she does not receive her nicotine patches in the mail before her quit date. Patient also plans to use gum that she has already purchased. Plan in place for her counselor to call her on quit date and continue to follow-up routinely. Will follow up with plan to continue or discontinue bupropion at the next visit.   Written patient instructions provided.  Total time in face to face counseling 30 minutes.   Follow up in Pharmacist Clinic Visit 1 week.   Patient seen with Jalene Mullet, PharmD, PGY1 Resident and Deirdre Pippins, PGY2 Pharmacy Resident, PharmD, BCPS.

## 2017-09-21 NOTE — Progress Notes (Deleted)
   Subjective   Patient ID: [name]    DOB: Apr 03, 1956, 62 y.o. female   MRN: [id number]  CC: "***"  HPI: [name] is a 62 y.o. female who presents to clinic today for the following:  ***: ***  ROS: see HPI for pertinent.  [place]: Poorly controlledT2DM with neuropathy and gastroparesis, HTN, HFpEF, COPD emphysema, tobacco use disorder, MDD, bipolar, fibromyalgia, cataracts.Surgical history TAH, LHC, iridectomy. Family history heart disease, DM, bipolar.Smoking status reviewed. Medications reviewed.  Objective   There were no vitals taken for this visit. Vitals and nursing note reviewed.  General: well nourished, well developed, NAD with non-toxic appearance HEENT: normocephalic, atraumatic, moist mucous membranes Neck: supple, non-tender without lymphadenopathy Cardiovascular: regular rate and rhythm without murmurs, rubs, or gallops Lungs: clear to auscultation bilaterally with normal work of breathing Abdomen: soft, non-tender, non-distended, normoactive bowel sounds Skin: warm, dry, no rashes or lesions, cap refill < 2 seconds Extremities: warm and well perfused, normal tone, no edema  Assessment & Plan   No problem-specific Assessment & Plan notes found for this encounter.  No orders of the defined types were placed in this encounter.  No orders of the defined types were placed in this encounter.   Harriet [place], [place], PGY-2 09/21/2017, 2:17 PM

## 2017-09-22 ENCOUNTER — Ambulatory Visit: Payer: Medicare HMO | Admitting: Family Medicine

## 2017-10-01 ENCOUNTER — Ambulatory Visit: Payer: Medicare HMO | Admitting: Pharmacist

## 2017-10-01 ENCOUNTER — Encounter: Payer: Medicare HMO | Attending: Medical | Admitting: Registered"

## 2017-10-01 DIAGNOSIS — E1165 Type 2 diabetes mellitus with hyperglycemia: Secondary | ICD-10-CM | POA: Diagnosis not present

## 2017-10-01 DIAGNOSIS — Z713 Dietary counseling and surveillance: Secondary | ICD-10-CM | POA: Diagnosis not present

## 2017-10-01 NOTE — Progress Notes (Signed)
Diabetes Self-Management Education  Visit Type: Follow-up  Appt. Start Time: 1000 Appt. End Time: 1030  10/01/2017  [name], identified by name and date of birth, is a 62 y.o. female with a diagnosis of Diabetes:  .   ASSESSMENT Patient physical movement and mood have improved greatly since the last 2 visits. Patient states she continues to have a poor appetite and states yesterday the only thing she wanted to eat was green beans. Patient stated her medication use and appears to have a good understanding of the purpose and timing of medications. Patient reports Metformin causes diarrhea of not eating. Patient reports using sliding scale: Pre-meal BG 100-200 4 units; 200-300 6 units; >300 8 units.  Patient states when she wakes up around 3:30 am if her FBG is below 80 she will drinks coffee with 2 tablespoons sugar to bring it up. Patient states she does not eat anything else or check BG again until around 10 am and may be up to 200-300 mg/dL.  Patient states she believes her main problem with blood sugar control now is she doesn't eat much and doesn't know what shakes or supplements are appropriate. Patient states she had tried Glucerna but because it is expensive, she purchased [employer] brand high protein nutritional shake (240 cal, 31 g cho, 15 g pro). RD provided patient with Boost High Protein sample which has 240 cal, 28 g cho, 20 g pro. RD believes a few more carbohydrates in her meal replacement shakes will be okay and may help prevent low blood sugar. Patient states she does not like meat and if she does eat a meal with carb & vegetable it will be lacking a protein source. RD advised patient to use the Glucerna as a protein source for those meals (adds ~10 net grams carbs, but also contributes fiber).   Samples: [employer] Essentials Lot#: [id number] A Exp: Aug 13, 2018  Boost High Protein Lot#: [id number] A 11:41 Exp: 06/29/2018   Diabetes Self-Management Education -  10/01/17 1157      Visit Information   Visit Type  Follow-up      Complications   How often do you check your blood sugar?  3-4 times/day      Patient Education   Nutrition management   Reviewed blood glucose goals for pre and post meals and how to evaluate the patients' food intake on their blood glucose level.;Meal timing in regards to the patients' current diabetes medication.;Meal options for control of blood glucose level and chronic complications.      Individualized Goals (developed by patient)   Nutrition  Other (comment) strategy for using meal replacement nutritional shakes      Outcomes   Expected Outcomes  Demonstrated interest in learning. Expect positive outcomes    Future DMSE  4-6 wks    Program Status  Not Completed      Subsequent Visit   Since your last visit have you continued or begun to take your medications as prescribed?  Yes    Since your last visit have you had your blood pressure checked?  Yes    Since your last visit, are you checking your blood glucose at least once a day?  Yes      Individualized Plan for Diabetes Self-Management Training:   Learning Objective:  Patient will have a greater understanding of diabetes self-management. Patient education plan is to attend individual and/or group sessions per assessed needs and concerns.  Patient Instructions  Use your Equate nutritional shakes in  coffee in the morning   Consider using lactose-free whole milk to prevent gas and bloating - Lactaid.  Ideas for meal replacements:  Carnation Breakfast essential with a glass of milk  Boost High Protein (has more protein and calories than Equate and may work better for a meal replacement)  To see if these meal replacements are working to help stabilize your blood sugar, continue to check blood sugar before meal and use sliding scale insulin as instructed. Then set your alarm for an hour and half to check your blood sugar again to see if it is in a good  range.  Whenever you have a taste for veggies be sure and eat them and add a source of protein which could be a Equate or Boost shake.  Expected Outcomes:  Demonstrated interest in learning. Expect positive outcomes  Education material provided: none  If problems or questions, patient to contact team via:  Phone  Future DSME appointment: 4-6 wks

## 2017-10-01 NOTE — Patient Instructions (Signed)
Use your Equate nutritional shakes in coffee in the morning   Consider using lactose-free whole milk to prevent gas and bloating - Lactaid.  Ideas for meal replacements:  C[place] Breakfast essential with a glass of milk  Boost High Protein (has more protein and calories than Equate and may work better for a meal replacement)  To see if these meal replacements are working to help stabilize your blood sugar, continue to check blood sugar before meal and use sliding scale insulin as instructed. Then set your alarm for an hour and half to check your blood sugar again to see if it is in a good range.  Whenever you have a taste for veggies be sure and eat them and add a source of protein which could be a Equate or Boost shake.

## 2017-10-06 ENCOUNTER — Ambulatory Visit: Payer: Medicare HMO | Admitting: Family Medicine

## 2017-10-27 ENCOUNTER — Telehealth: Payer: Self-pay | Admitting: *Deleted

## 2017-10-27 DIAGNOSIS — E785 Hyperlipidemia, unspecified: Secondary | ICD-10-CM | POA: Diagnosis not present

## 2017-10-27 DIAGNOSIS — E1140 Type 2 diabetes mellitus with diabetic neuropathy, unspecified: Secondary | ICD-10-CM | POA: Diagnosis not present

## 2017-10-27 DIAGNOSIS — Z794 Long term (current) use of insulin: Secondary | ICD-10-CM | POA: Diagnosis not present

## 2017-10-27 DIAGNOSIS — E1143 Type 2 diabetes mellitus with diabetic autonomic (poly)neuropathy: Secondary | ICD-10-CM | POA: Diagnosis not present

## 2017-10-27 DIAGNOSIS — R69 Illness, unspecified: Secondary | ICD-10-CM | POA: Diagnosis not present

## 2017-10-27 DIAGNOSIS — I10 Essential (primary) hypertension: Secondary | ICD-10-CM | POA: Diagnosis not present

## 2017-10-27 DIAGNOSIS — I4891 Unspecified atrial fibrillation: Secondary | ICD-10-CM | POA: Diagnosis not present

## 2017-10-27 DIAGNOSIS — G8929 Other chronic pain: Secondary | ICD-10-CM | POA: Diagnosis not present

## 2017-10-27 DIAGNOSIS — G3184 Mild cognitive impairment, so stated: Secondary | ICD-10-CM | POA: Diagnosis not present

## 2017-10-27 NOTE — Telephone Encounter (Signed)
Dr. Chrissie Noa from [employer] called in while he was seeing pt. Wanted to inform us that she failed her memory test, had an A1C of 12, BP today was 182/99, has a risk of CVA and neuropathy. I informed him that if she wasn't feeling good she should go to the ED to be checked out. They declined and will be coming in the office on Thursday for an appt. Deseree Kennon Holter, CMA

## 2017-10-28 NOTE — Telephone Encounter (Signed)
Noted. Thanks.  Harriet Butte, [place], PGY-2

## 2017-10-29 ENCOUNTER — Ambulatory Visit (INDEPENDENT_AMBULATORY_CARE_PROVIDER_SITE_OTHER): Payer: Medicare HMO | Admitting: Family Medicine

## 2017-10-29 ENCOUNTER — Other Ambulatory Visit: Payer: Self-pay

## 2017-10-29 ENCOUNTER — Telehealth: Payer: Self-pay

## 2017-10-29 ENCOUNTER — Other Ambulatory Visit: Payer: Self-pay | Admitting: Family Medicine

## 2017-10-29 ENCOUNTER — Encounter: Payer: Self-pay | Admitting: Family Medicine

## 2017-10-29 VITALS — BP 124/62 | HR 84 | Temp 98.6°F | Ht 62.0 in | Wt 145.2 lb

## 2017-10-29 DIAGNOSIS — E1165 Type 2 diabetes mellitus with hyperglycemia: Secondary | ICD-10-CM

## 2017-10-29 DIAGNOSIS — I10 Essential (primary) hypertension: Secondary | ICD-10-CM

## 2017-10-29 DIAGNOSIS — Z23 Encounter for immunization: Secondary | ICD-10-CM

## 2017-10-29 DIAGNOSIS — Z0000 Encounter for general adult medical examination without abnormal findings: Secondary | ICD-10-CM

## 2017-10-29 LAB — POCT GLYCOSYLATED HEMOGLOBIN (HGB A1C): HEMOGLOBIN A1C: 10.6

## 2017-10-29 MED ORDER — CANDESARTAN CILEXETIL 4 MG PO TABS
4.0000 mg | ORAL_TABLET | Freq: Every day | ORAL | 11 refills | Status: DC
Start: 2017-10-29 — End: 2018-08-03

## 2017-10-29 MED ORDER — METFORMIN HCL ER (MOD) 1000 MG PO TB24: 1000.0000 mg | ORAL_TABLET | Freq: Every day | ORAL | 11 refills | Status: DC

## 2017-10-29 MED ORDER — METFORMIN HCL ER 750 MG PO TB24: 750.0000 mg | ORAL_TABLET | Freq: Every day | ORAL | 11 refills | Status: DC

## 2017-10-29 MED ORDER — CYCLOBENZAPRINE HCL 10 MG PO TABS: 10.0000 mg | ORAL_TABLET | Freq: Every day | ORAL | 0 refills | Status: DC | PRN

## 2017-10-29 NOTE — Assessment & Plan Note (Signed)
Ordered screening mammogram and gave patient number for breast center.  Gave first PPV23 shot today.

## 2017-10-29 NOTE — Telephone Encounter (Addendum)
Fax from [place] stating the Metform MOD ER 1000 mg is not covered by insurance and is extremely expensive.   May need to prescribe Metformin ER 500 mg with the correct quantity and dosing directions as the ER 1000 mg are all the modified versions.  Danley Danker, RN [facility] RN)

## 2017-10-29 NOTE — Progress Notes (Signed)
Subjective:    [name] - 62 y.o. female MRN [id number]  Date of birth: [birth year]  HPI  [name] is here for diabetes and smoking cessation follow up.    Diabetes She is currently taking Tresiba 26 units, Novolog 4, 6, and 8 units depending on glucose reading, but she has stopped the metformin because it is giving her diarrhea.  She had a difficult time remembering the [place], but this week, she is taking it regularly this past week with improvements.  She denies hypoglycemic events.  Sugars have ranged from 65-455 in the last three weeks, although highest blood sugar over last week was 178.  Diet is still poor - she eats one meal per day and has run out of glucerna and boost shakes.    Hypertension Is not taking telmisartan-HCTZ since she cannot remember to take medications at night.  She cannot take it earlier in the day because it makes her sleepy.  Smoking cessation Is not using the patches and gum.  Quit date was April 1, but has not quit.  Her partner also started back smoking.  Smokes half pack of cigars per day.  Called the 1-800QUITNOW line, but they did not follow up with her like they said they would.  She still would really like to quit, but it is very challenging for her.   Health Maintenance:  - would like a mammogram and is planning on going to the eye doctor soon - would like pneumonia shot Health Maintenance Due  Topic Date Due  . OPHTHALMOLOGY EXAM  06/03/2017  . MAMMOGRAM  07/22/2017  . PNEUMOCOCCAL POLYSACCHARIDE VACCINE (2) 08/14/2017    -  reports that she has been smoking cigarettes.  She has a 21.50 pack-year smoking history. She has never used smokeless tobacco. - Review of Systems: Per HPI. - Past Medical History: Patient Active Problem List   Diagnosis Date Noted  . Healthcare maintenance 10/29/2017  . Uncontrolled type 2 diabetes mellitus with hyperglycemia ([place]) 07/24/2017  . COPD with emphysema ([place]) 02/06/2017  . 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 Poor appetite 10/04/2015  . Personal history of colonic polyps 09/13/2015  . Tardive dyskinesia 06/19/2014  . Cataracts, bilateral 03/21/2014  . Chronic diastolic CHF (congestive heart failure) ([place]) 07/26/2013  . Primary hypertension 08/12/2012  . Other bipolar disorder ([place]) 08/23/2009  . Fibromyalgia 08/06/2009  . Gastroparesis 03/22/2009  . 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 DM (diabetes mellitus), type 2, uncontrolled ([place]) 08/18/2006  . Tobacco use disorder 08/18/2006  . Hyperlipidemia due to type 2 diabetes mellitus ([place]) 08/13/2006  . Depression, major, recurrent ([place]) 08/13/2006   - Medications: reviewed and updated   Objective:   Physical Exam BP 124/62   Pulse 84   Temp 98.6 F (37 C) (Oral)   Ht 5\' 2"  (1.575 m)   Wt 145 lb 3.2 oz (65.9 kg)   SpO2 99%   BMI 26.56 kg/m  Gen: NAD, alert, cooperative with exam HEENT: NCAT, poor dentition, clear conjunctiva CV: RRR, good S5/K5, 1/6 systolic murmur, no edema Resp: CTABL, no wheezes, non-labored Abd: SNTND, BS present, no guarding or organomegaly Skin: no rashes, normal turgor  Neuro: no gross deficits.  Psych: good insight, alert and oriented        Assessment & Plan:   DM (diabetes mellitus), type 2, uncontrolled ([place]) A1c is 10.6 today, improved from 12.3 in January 2019.  We will continue the current  doses of Tresiba 26 units daily and Novolog sliding scale.  Blood sugars have improved over the last week when she started taking [place] regularly.  I changed patient's metformin to metformin ER 1,000 mg daily to hopefully decrease patient's diarrhea.  She should return in three months for an A1c check.  Primary hypertension Will stop telmesartan/HCTZ since she is not taking this medication and try candesartan instead.  Hopefully this will not make her sleepy, and she can get the renal and cardiac protection of the ARB.  Healthcare maintenance Ordered  screening mammogram and gave patient number for breast center.  Gave first PPV23 shot today.    Maia Breslow, M.D. 10/29/2017, 2:56 PM PGY-1, [place]

## 2017-10-29 NOTE — Assessment & Plan Note (Signed)
Will stop telmesartan/HCTZ since she is not taking this medication and try candesartan instead.  Hopefully this will not make her sleepy, and she can get the renal and cardiac protection of the ARB.

## 2017-10-29 NOTE — Telephone Encounter (Signed)
I will change the prescription to reflect this.  Thanks!

## 2017-10-29 NOTE — Progress Notes (Signed)
a1c

## 2017-10-29 NOTE — Assessment & Plan Note (Addendum)
A1c is 10.6 today, improved from 12.3 in January 2019.  We will continue the current doses of Tresiba 26 units daily and Novolog sliding scale.  Blood sugars have improved over the last week when she started taking [place] regularly.  I changed patient's metformin to metformin ER 1,000 mg daily to hopefully decrease patient's diarrhea.  She should return in three months for an A1c check.

## 2017-10-29 NOTE — Patient Instructions (Addendum)
It was nice meeting you today Ms. Treto!  Your Hgb A1c today was 10.6, which is much better than 12.3 from last time!  We are not changing your [place] and Novolog prescriptions.  We are changing your metformin to metformin extended release, which should decrease the chance of you developing diarrhea.    For your hypertension, please stop the telmesartan-HCTZ and start candesartan once per day instead.    Please call the 1-800QUITNOW line and use nicotine patches and gum to help you stop smoking.  I placed the order for you to get a mammogram.  Please call the number on the sheet provided to make your appointment.  Please return in 3 months for a diabetes check up.  If you have any questions or concerns, please feel free to call the clinic.   Be well,  Dr. Shan Levans

## 2017-11-12 ENCOUNTER — Ambulatory Visit: Payer: Medicare HMO | Admitting: Registered"

## 2017-11-29 DIAGNOSIS — R69 Illness, unspecified: Secondary | ICD-10-CM | POA: Diagnosis not present

## 2017-12-31 ENCOUNTER — Encounter: Payer: Self-pay | Admitting: Family Medicine

## 2017-12-31 ENCOUNTER — Ambulatory Visit
Admission: RE | Admit: 2017-12-31 | Discharge: 2017-12-31 | Disposition: A | Discharge status: Home / Self Care | Payer: Medicare HMO | Source: Ambulatory Visit | Attending: Family Medicine | Admitting: Family Medicine

## 2017-12-31 ENCOUNTER — Telehealth: Payer: Self-pay | Admitting: Family Medicine

## 2017-12-31 DIAGNOSIS — Z1231 Encounter for screening mammogram for malignant neoplasm of breast: Secondary | ICD-10-CM | POA: Diagnosis not present

## 2017-12-31 DIAGNOSIS — Z0000 Encounter for general adult medical examination without abnormal findings: Secondary | ICD-10-CM

## 2017-12-31 NOTE — Telephone Encounter (Signed)
LVM to schedule DM f/u. Please assist is doing this

## 2018-01-19 ENCOUNTER — Telehealth: Payer: Self-pay | Admitting: *Deleted

## 2018-01-19 NOTE — Telephone Encounter (Signed)
Rx refill request for telmisa HCTZ 80-12.5 mg Tab. Not on med list please advise. Kourtney Terriquez Kennon Holter, CMA

## 2018-01-19 NOTE — Telephone Encounter (Signed)
Patient was switched to candesartan.  Harriet Butte, [place], PGY-3

## 2018-03-04 ENCOUNTER — Ambulatory Visit (INDEPENDENT_AMBULATORY_CARE_PROVIDER_SITE_OTHER): Payer: Medicare HMO | Admitting: Family Medicine

## 2018-03-04 ENCOUNTER — Encounter: Payer: Self-pay | Admitting: Family Medicine

## 2018-03-04 VITALS — BP 140/100 | HR 107 | Temp 98.7°F | Wt 144.4 lb

## 2018-03-04 DIAGNOSIS — M65349 Trigger finger, unspecified ring finger: Secondary | ICD-10-CM | POA: Diagnosis not present

## 2018-03-04 DIAGNOSIS — I10 Essential (primary) hypertension: Secondary | ICD-10-CM

## 2018-03-04 DIAGNOSIS — E1169 Type 2 diabetes mellitus with other specified complication: Secondary | ICD-10-CM

## 2018-03-04 DIAGNOSIS — M6530 Trigger finger, unspecified finger: Secondary | ICD-10-CM | POA: Insufficient documentation

## 2018-03-04 DIAGNOSIS — Z794 Long term (current) use of insulin: Secondary | ICD-10-CM

## 2018-03-04 DIAGNOSIS — E0840 Diabetes mellitus due to underlying condition with diabetic neuropathy, unspecified: Secondary | ICD-10-CM

## 2018-03-04 DIAGNOSIS — E785 Hyperlipidemia, unspecified: Secondary | ICD-10-CM | POA: Diagnosis not present

## 2018-03-04 DIAGNOSIS — E1165 Type 2 diabetes mellitus with hyperglycemia: Secondary | ICD-10-CM | POA: Diagnosis not present

## 2018-03-04 DIAGNOSIS — IMO0002 Reserved for concepts with insufficient information to code with codable children: Secondary | ICD-10-CM | POA: Insufficient documentation

## 2018-03-04 DIAGNOSIS — E0865 Diabetes mellitus due to underlying condition with hyperglycemia: Secondary | ICD-10-CM

## 2018-03-04 DIAGNOSIS — E1140 Type 2 diabetes mellitus with diabetic neuropathy, unspecified: Secondary | ICD-10-CM

## 2018-03-04 DIAGNOSIS — Z23 Encounter for immunization: Secondary | ICD-10-CM | POA: Diagnosis not present

## 2018-03-04 LAB — POCT URINALYSIS DIPSTICK
Bilirubin, UA: NEGATIVE
Glucose, UA: POSITIVE — AB
Ketones, UA: NEGATIVE
Leukocytes, UA: NEGATIVE
Nitrite, UA: POSITIVE
Protein, UA: NEGATIVE
Spec Grav, UA: 1.02 (ref 1.010–1.025)
Urobilinogen, UA: 0.2 E.U./dL
pH, UA: 6.5 (ref 5.0–8.0)

## 2018-03-04 LAB — POCT GLYCOSYLATED HEMOGLOBIN (HGB A1C): HbA1c, POC (controlled diabetic range): 8.6 % — AB (ref 0.0–7.0)

## 2018-03-04 MED ORDER — METOPROLOL SUCCINATE ER 50 MG PO TB24: ORAL_TABLET | ORAL | 3 refills | Status: DC

## 2018-03-04 MED ORDER — ATORVASTATIN CALCIUM 40 MG PO TABS: 40.0000 mg | ORAL_TABLET | Freq: Every day | ORAL | 3 refills | Status: DC

## 2018-03-04 NOTE — Patient Instructions (Signed)
Thank you for coming in to see [place] today. Please see below to review our plan for today's visit.  1.  Your diabetes looks much better today!  Continue taking your Tresiba 26 units daily along with your sliding scale NovoLog.  I would also like you to start your extended release metformin which is a medication you take by mouth.  We will check your A1c again in 3 months. 2.  I placed a referral for you to see sports medicine for your hands and feet.  They will call you and schedule the appointment. 3.  You need to take the atorvastatin for your cholesterol.  I sent this medication and along with your metoprolol refill. 4.  You are now up-to-date on your annual flu vaccine.  Please call the clinic at [phone] if your symptoms worsen or you have any concerns. It was our pleasure to serve you.  Harriet Butte, [place], PGY-3

## 2018-03-04 NOTE — Progress Notes (Addendum)
Subjective:  [name] is a 62 y.o. female who presents to the [facility] today for a diabetes follow up.   HPI: Patient reports she is doing "alright" but that she is confused on which blood pressure medications she should be taking. She is also complaining of knots on the palms of her hands and her fingers locking. She says that she has had this issue before and had injections in her hands.   Type 2 Diabetes Mellitus  Patient reports that she has been compliant with her diabetes medications. She takes [place] 26 units daily and Novalog sliding scale. She reports she check her blood glucose before meals and if it is the 100s she take 4 units, if it is in the 200s she takes 6 units, and if it is in the 300s she takes 8 units. She reports that She only has to take 8 units maybe once every 2 weeks. She reports occasional low blood sugars during the day in the 60s but no lows overnight.   Patient says her diet is "terrible". She reports small appetite. Drinks ensure to make sure she is getting calories. No breakfast. Lunch- chicken noodle soup. Tries to eat full meal- greens, mashed potatoes, meat 2-3 times a weeks.   Denies fever, chills, headache, blurry vision, NVD, dysuria. Reports constipation (self enema once or twice a week), and increased urination.   Objective:  Physical Exam: BP (!) 140/100   Pulse (!) 107   Temp 98.7 F (37.1 C)   Wt 144 lb 6.4 oz (65.5 kg)   SpO2 97%   BMI 26.41 kg/m   Gen: NAD, cooperative    CV: RRR, S3 noted with 2/6 systolic crescendo-decrescendo murmur              Pulm: NWOB, CTAB with no crackles, wheezes, or rhonchi GI: Bowel sounds present. Soft, Nontender, Nondistended. MSK: no edema, cyanosis, or clubbing noted, Nodules noted on palmar aspect of both hands bilaterally as well as left foot  Skin: warm, dry, minor calices on both feet bilaterally  Neuro: grossly normal, moves all extremities, diabetic foot exam was negative  Psych: Normal affect  and thought content  Results for orders placed or performed in visit on 03/04/18 (from the past 72 hour(s))  HgB A1c     Status: Abnormal   Collection Time: 03/04/18  9:00 AM  Result Value Ref Range   Hemoglobin A1C     HbA1c POC (<> result, manual entry)     HbA1c, POC (prediabetic range)     HbA1c, POC (controlled diabetic range) 8.6 (A) 0.0 - 7.0 %     Assessment/Plan:  [name] is a 62 y.o. female who presents to the [facility] today for a diabetes follow up.   T2DM -- Patient currently taking Tresbia 26 units daily and Novalog sliding sclae with meals -- Patient was taking Metformin 1000 mg daily but due to GI issues it was discontinued.     -- A1C improved today to 8.6 from 10.6 in May -- Metformin XR 750 mg daily  -- Got foot exam today  -- Flu vaccine today     HTN  -- Patient non compliant with medications due to uncertainty on which medications she should be taking  -- Candesartan 4 mg daily  -- Metoprolol 50 mg daily  -- Refill on Atorvastatin   Trigger finger -- Patient reports issues with fingers locking an nodules on palms of both hands  -- Says that she  has had injections in the past which helped  -- Referral to sports medicine for further evaluation    [name], [place] [facility]  03/04/2018 9:26 AM  I have personally seen and examined this patient with [name] and agree with the above note. The following is my additional documentation.   Physical Exam: General: well nourished, well developed, NAD with non-toxic appearance HEENT: normocephalic, atraumatic, moist mucous membranes Cardiovascular: regular rate and rhythm with S3 and 2/6 crescendo-decrescendo systolic murmur without rubs, or gallops Lungs: clear to auscultation bilaterally with normal work of breathing Abdomen: soft, non-tender, non-distended, normoactive bowel sounds Skin: warm, dry, no rashes or lesions, cap refill < 2 seconds Extremities: warm and well  perfused, normal tone, no edema, palpable nodule on palmar aspect of hands bilaterally and on left first metatarsal plantar surface with associated mild tenderness Psych: euthymic mood, congruent affect, mumbled but coherent speech  Assessment/Plan [name] is a 61 y.o. female presenting with routine diabetes follow-up.  Diabetes: Improved but suboptimal with A1c 8.6.  Seems to be having occasional episodes of hypoglycemia in the 60s during the daytime but shows good understanding of hypoglycemia treatment.  She is also been taking the sliding scale NovoLog based on her sugar levels but has not been taking the extended release metformin as instructed by her last visit.  Her diarrhea has resolved.  She continues to have some increased urination without infectious symptoms likely supporting asymptomatic bacteriuria.  Suspect this is more related to diabetes.  Patient also received annual flu shot and diabetic foot exam.  Plan to follow-up in 3 months for next A1c check.  Hypertension: Recently switched off of lisinopril for associated cough.  Blood pressure uncontrolled today due to not taking medication and confusion since his transition to candesartan.  Patient also reporting no use of atorvastatin because she did not realize she needed to take it.  Her last LDL was elevated in the 200s.  Patient given refill for atorvastatin 40 mg daily and Toprol-XL 25 mg daily.  Trigger finger: Patient has bilateral involvement of hands consistent with trigger finger along with a nodule on her left plantar surface also consistent with similar presentation.  Patient does report having this in the past with resolution of steroid injection.  Plan to refer to sports medicine for further management.  Harriet Butte, [place], PGY-3

## 2018-03-04 NOTE — Progress Notes (Deleted)
   Subjective   Patient ID: [name]    DOB: Oct 14, 1955, 62 y.o. female   MRN: [id number]  CC: "***"  HPI: [name] is a 62 y.o. female who presents to clinic today for the following:  ***: ***  ***Last seen by me in March.  Has history of unpredictable food intake making controlled.  NovoLog adjusted to 4, 6, 8 units twice daily depending on scale.  Discontinued bolus insulin 8 units with meals.  Switch Lantus 30 units to [place] 26 units.  Have a cough and discontinue lisinopril/HCTZ and switch to telmisartan/HCTZ.  A1c 10.6 back in May.  This is improvement from January, 12.3.  Form and switch to extended release 1000 daily due to patient's diarrhea.  Eye exam?  Needs flu and foot exam.  Ask about statin compliance.  ROS: see HPI for pertinent.  [place]: IDDM with neuropathy and gastroparesis, HTN, HFpEF, COPD emphysema, tobacco use disorder, MDD, bipolar, fibromyalgia, cataracts.Surgical history TAH, LHC, iridectomy. Family history heart disease, DM, bipolar.Smoking status reviewed. Medications reviewed.  Objective   There were no vitals taken for this visit. Vitals and nursing note reviewed.  General: well nourished, well developed, NAD with non-toxic appearance HEENT: normocephalic, atraumatic, moist mucous membranes Neck: supple, non-tender without lymphadenopathy Cardiovascular: regular rate and rhythm without murmurs, rubs, or gallops Lungs: clear to auscultation bilaterally with normal work of breathing Abdomen: soft, non-tender, non-distended, normoactive bowel sounds Skin: warm, dry, no rashes or lesions, cap refill < 2 seconds Extremities: warm and well perfused, normal tone, no edema  Assessment & Plan   No problem-specific Assessment & Plan notes found for this encounter.  No orders of the defined types were placed in this encounter.  No orders of the defined types were placed in this encounter.   Harriet Butte, [place],  PGY-3 03/04/2018, 8:06 AM

## 2018-03-11 ENCOUNTER — Encounter: Payer: Self-pay | Admitting: Sports Medicine

## 2018-03-11 ENCOUNTER — Ambulatory Visit (INDEPENDENT_AMBULATORY_CARE_PROVIDER_SITE_OTHER): Payer: Medicare HMO | Admitting: Sports Medicine

## 2018-03-11 VITALS — BP 163/74 | Ht 62.0 in | Wt 145.0 lb

## 2018-03-11 DIAGNOSIS — M6530 Trigger finger, unspecified finger: Secondary | ICD-10-CM

## 2018-03-11 MED ORDER — METHYLPREDNISOLONE ACETATE 40 MG/ML IJ SUSP
20.0000 mg | Freq: Once | INTRAMUSCULAR | Status: AC
  Administered 2018-03-11: 20 mg via INTRA_ARTICULAR

## 2018-03-11 NOTE — Patient Instructions (Signed)
Today your evaluated for your trigger finger. We performed a steroid injection today.  You should slowly start to see improvement over the next week.  If you notice any severe pain, swelling and redness, please return to the sports medicine clinic. We will have you follow-up in 4 weeks if you are still having symptoms

## 2018-03-11 NOTE — Progress Notes (Signed)
  [name] - 62 y.o. female MRN [id number]  Date of birth: 31-Jul-1955    SUBJECTIVE:      Chief Complaint:/ HPI:  62 year old female presents with left middle finger pain for the past month.  Patient states she has triggering of the finger.  It is painful to make a fist.  When she does grip objects she feels her finger gets stuck and she has to pry her hand open.  She denies any numbness or tingling.  She denies any previous injuries.  She denies any swelling or bruising.  She notes tenderness over the MCP joint on the palmar aspect.  Patient has a history of trigger finger in the left thumb which was previously treated with steroid injection many years ago.  She reports good relief with that.  Patient would like steroid injection for this trigger finger as well   ROS:     See HPI  PERTINENT  PMH / PSH FH / / SH:  Past Medical, Surgical, Social, and Family History Reviewed & Updated in the EMR.  Pertinent findings include:  Uncontrolled diabetes  OBJECTIVE: BP (!) 163/74   Ht 5\' 2"  (1.575 m)   Wt 145 lb (65.8 kg)   BMI 26.52 kg/m   Physical Exam:  Vital signs are reviewed.  GEN: Alert and oriented, NAD Pulm: Breathing unlabored PSY: normal mood, congruent affect  MSK: Left hand: Inspection: No obvious deformity. No swelling, erythema or bruising Palpation: Tenderness over the A1 pulley of the middle finger ROM: Decreased range of motion in flexion of the middle finger.  Patient is very guarded and hesitant to fully flex the middle finger Strength: 5/5 strength in the forearm, wrist and interosseus muscles Neurovascular: NV intact  Right hand: Inspection: No obvious deformity. No swelling, erythema or bruising Palpation: no TTP ROM: Full ROM of the digits  Strength: 5/5 strength in the forearm, wrist and interosseus muscles Neurovascular: NV intact     ASSESSMENT & PLAN:  1.  Trigger finger of left middle finger.  Steroid injections were performed today as  described below.  Patient follow-up in 4 weeks if she is still having symptoms.  After informed written consent timeout was performed and patient was seated in chair of exam room.  Area overlying left middle finger A1 pulley prepped with alcohol swab then injected with 0.5:0.43mL lidocaine: depomedrol.  Patient tolerated procedure well without immediate complications.

## 2018-03-20 DIAGNOSIS — R69 Illness, unspecified: Secondary | ICD-10-CM | POA: Diagnosis not present

## 2018-05-24 ENCOUNTER — Ambulatory Visit: Payer: Medicare HMO | Admitting: Family Medicine

## 2018-05-24 NOTE — Progress Notes (Deleted)
   Subjective   Patient ID: [name]    DOB: 07-Aug-1955, 62 y.o. female   MRN: [id number]  CC: "***"  HPI: [name] is a 62 y.o. female who presents to clinic today for the following:  ***: ***  ***For diabetes follow-up.  Last A1c 8.62 months ago which was improved from 10.6.  Patient reported taking 26 units of Tresiba and a NovoLog sliding scale.  She did report a poor diet with small appetite supplemented with Ensure.  ROS: see HPI for pertinent.  [place]: IDDM with neuropathy and gastroparesis, HTN, HFpEF, COPD emphysema, tobacco use disorder, MDD, bipolar, fibromyalgia, cataracts.Surgical history TAH, LHC, iridectomy. Family history heart disease, DM, bipolar. Smoking status reviewed. Medications reviewed.  Objective   There were no vitals taken for this visit. Vitals and nursing note reviewed.  General: well nourished, well developed, NAD with non-toxic appearance HEENT: normocephalic, atraumatic, moist mucous membranes Neck: supple, non-tender without lymphadenopathy Cardiovascular: regular rate and rhythm without murmurs, rubs, or gallops Lungs: clear to auscultation bilaterally with normal work of breathing Abdomen: soft, non-tender, non-distended, normoactive bowel sounds Skin: warm, dry, no rashes or lesions, cap refill < 2 seconds Extremities: warm and well perfused, normal tone, no edema  Assessment & Plan   No problem-specific Assessment & Plan notes found for this encounter.  No orders of the defined types were placed in this encounter.  No orders of the defined types were placed in this encounter.   Harriet Butte, [place], PGY-3 05/24/2018, 8:17 AM

## 2018-06-28 ENCOUNTER — Ambulatory Visit: Payer: Medicare HMO | Admitting: Family Medicine

## 2018-06-28 NOTE — Progress Notes (Deleted)
   Subjective   Patient ID: [name]    DOB: [birth year], 63 y.o. female   MRN: [id number]  CC: "***"  HPI: [name] is a 63 y.o. female who presents to clinic today for the following:  ***: ***  ROS: see HPI for pertinent.  [place]: IDDM with neuropathy and gastroparesis, HTN, HFpEF, COPD emphysema, tobacco use disorder, MDD, bipolar, fibromyalgia, cataracts.Surgical history TAH, LHC, iridectomy. Family history heart disease, DM, bipolar.Smoking status reviewed. Medications reviewed.  Objective   There were no vitals taken for this visit. Vitals and nursing note reviewed.  General: well nourished, well developed, NAD with non-toxic appearance HEENT: normocephalic, atraumatic, moist mucous membranes Neck: supple, non-tender without lymphadenopathy Cardiovascular: regular rate and rhythm without murmurs, rubs, or gallops Lungs: clear to auscultation bilaterally with normal work of breathing Abdomen: soft, non-tender, non-distended, normoactive bowel sounds Skin: warm, dry, no rashes or lesions, cap refill < 2 seconds Extremities: warm and well perfused, normal tone, no edema  Assessment & Plan   No problem-specific Assessment & Plan notes found for this encounter.  No orders of the defined types were placed in this encounter.  No orders of the defined types were placed in this encounter.   Harriet Butte, [place], PGY-3 06/28/2018, 8:56 AM

## 2018-07-22 ENCOUNTER — Inpatient Hospital Stay (HOSPITAL_BASED_OUTPATIENT_CLINIC_OR_DEPARTMENT_OTHER)
Admission: EM | Admit: 2018-07-22 | Discharge: 2018-07-27 | DRG: 286 | Disposition: A | Discharge status: Home / Self Care | Payer: Medicare HMO | Attending: Family Medicine | Admitting: Family Medicine

## 2018-07-22 ENCOUNTER — Other Ambulatory Visit: Payer: Self-pay

## 2018-07-22 ENCOUNTER — Emergency Department (HOSPITAL_BASED_OUTPATIENT_CLINIC_OR_DEPARTMENT_OTHER): Payer: Medicare HMO

## 2018-07-22 ENCOUNTER — Encounter (HOSPITAL_BASED_OUTPATIENT_CLINIC_OR_DEPARTMENT_OTHER): Payer: Self-pay | Admitting: *Deleted

## 2018-07-22 DIAGNOSIS — E876 Hypokalemia: Secondary | ICD-10-CM | POA: Diagnosis present

## 2018-07-22 DIAGNOSIS — Z8249 Family history of ischemic heart disease and other diseases of the circulatory system: Secondary | ICD-10-CM

## 2018-07-22 DIAGNOSIS — E875 Hyperkalemia: Secondary | ICD-10-CM | POA: Diagnosis not present

## 2018-07-22 DIAGNOSIS — Z794 Long term (current) use of insulin: Secondary | ICD-10-CM

## 2018-07-22 DIAGNOSIS — I471 Supraventricular tachycardia: Secondary | ICD-10-CM | POA: Diagnosis not present

## 2018-07-22 DIAGNOSIS — R0602 Shortness of breath: Secondary | ICD-10-CM | POA: Diagnosis not present

## 2018-07-22 DIAGNOSIS — Z818 Family history of other mental and behavioral disorders: Secondary | ICD-10-CM

## 2018-07-22 DIAGNOSIS — I509 Heart failure, unspecified: Secondary | ICD-10-CM

## 2018-07-22 DIAGNOSIS — I083 Combined rheumatic disorders of mitral, aortic and tricuspid valves: Secondary | ICD-10-CM | POA: Diagnosis present

## 2018-07-22 DIAGNOSIS — E1143 Type 2 diabetes mellitus with diabetic autonomic (poly)neuropathy: Secondary | ICD-10-CM | POA: Diagnosis present

## 2018-07-22 DIAGNOSIS — J439 Emphysema, unspecified: Secondary | ICD-10-CM | POA: Diagnosis present

## 2018-07-22 DIAGNOSIS — I454 Nonspecific intraventricular block: Secondary | ICD-10-CM | POA: Diagnosis present

## 2018-07-22 DIAGNOSIS — Z886 Allergy status to analgesic agent status: Secondary | ICD-10-CM

## 2018-07-22 DIAGNOSIS — E1169 Type 2 diabetes mellitus with other specified complication: Secondary | ICD-10-CM | POA: Diagnosis present

## 2018-07-22 DIAGNOSIS — F319 Bipolar disorder, unspecified: Secondary | ICD-10-CM | POA: Diagnosis present

## 2018-07-22 DIAGNOSIS — E1165 Type 2 diabetes mellitus with hyperglycemia: Secondary | ICD-10-CM | POA: Diagnosis present

## 2018-07-22 DIAGNOSIS — R0902 Hypoxemia: Secondary | ICD-10-CM

## 2018-07-22 DIAGNOSIS — E119 Type 2 diabetes mellitus without complications: Secondary | ICD-10-CM

## 2018-07-22 DIAGNOSIS — K219 Gastro-esophageal reflux disease without esophagitis: Secondary | ICD-10-CM | POA: Diagnosis present

## 2018-07-22 DIAGNOSIS — J9601 Acute respiratory failure with hypoxia: Secondary | ICD-10-CM | POA: Diagnosis present

## 2018-07-22 DIAGNOSIS — Z888 Allergy status to other drugs, medicaments and biological substances status: Secondary | ICD-10-CM

## 2018-07-22 DIAGNOSIS — E785 Hyperlipidemia, unspecified: Secondary | ICD-10-CM | POA: Diagnosis present

## 2018-07-22 DIAGNOSIS — I5041 Acute combined systolic (congestive) and diastolic (congestive) heart failure: Secondary | ICD-10-CM | POA: Diagnosis present

## 2018-07-22 DIAGNOSIS — Z6823 Body mass index (BMI) 23.0-23.9, adult: Secondary | ICD-10-CM

## 2018-07-22 DIAGNOSIS — Z825 Family history of asthma and other chronic lower respiratory diseases: Secondary | ICD-10-CM

## 2018-07-22 DIAGNOSIS — I700 Atherosclerosis of aorta: Secondary | ICD-10-CM | POA: Diagnosis present

## 2018-07-22 DIAGNOSIS — M797 Fibromyalgia: Secondary | ICD-10-CM | POA: Diagnosis present

## 2018-07-22 DIAGNOSIS — I4581 Long QT syndrome: Secondary | ICD-10-CM | POA: Diagnosis present

## 2018-07-22 DIAGNOSIS — I10 Essential (primary) hypertension: Secondary | ICD-10-CM

## 2018-07-22 DIAGNOSIS — K3184 Gastroparesis: Secondary | ICD-10-CM | POA: Diagnosis present

## 2018-07-22 DIAGNOSIS — Z79899 Other long term (current) drug therapy: Secondary | ICD-10-CM

## 2018-07-22 DIAGNOSIS — F17211 Nicotine dependence, cigarettes, in remission: Secondary | ICD-10-CM | POA: Diagnosis present

## 2018-07-22 DIAGNOSIS — I110 Hypertensive heart disease with heart failure: Secondary | ICD-10-CM | POA: Diagnosis not present

## 2018-07-22 DIAGNOSIS — Z9114 Patient's other noncompliance with medication regimen: Secondary | ICD-10-CM

## 2018-07-22 DIAGNOSIS — I5021 Acute systolic (congestive) heart failure: Secondary | ICD-10-CM

## 2018-07-22 DIAGNOSIS — Z86010 Personal history of colonic polyps: Secondary | ICD-10-CM

## 2018-07-22 DIAGNOSIS — E440 Moderate protein-calorie malnutrition: Secondary | ICD-10-CM

## 2018-07-22 DIAGNOSIS — F3170 Bipolar disorder, currently in remission, most recent episode unspecified: Secondary | ICD-10-CM

## 2018-07-22 DIAGNOSIS — Z833 Family history of diabetes mellitus: Secondary | ICD-10-CM

## 2018-07-22 LAB — CBC WITH DIFFERENTIAL/PLATELET
ABS IMMATURE GRANULOCYTES: 0.01 10*3/uL (ref 0.00–0.07)
BASOS PCT: 1 %
Basophils Absolute: 0.1 10*3/uL (ref 0.0–0.1)
Eosinophils Absolute: 0.1 10*3/uL (ref 0.0–0.5)
Eosinophils Relative: 2 %
HCT: 36.9 % (ref 36.0–46.0)
HEMOGLOBIN: 11.8 g/dL — AB (ref 12.0–15.0)
Immature Granulocytes: 0 %
LYMPHS PCT: 59 %
Lymphs Abs: 3.5 10*3/uL (ref 0.7–4.0)
MCH: 30.5 pg (ref 26.0–34.0)
MCHC: 32 g/dL (ref 30.0–36.0)
MCV: 95.3 fL (ref 80.0–100.0)
MONO ABS: 0.4 10*3/uL (ref 0.1–1.0)
Monocytes Relative: 6 %
NEUTROS ABS: 1.9 10*3/uL (ref 1.7–7.7)
Neutrophils Relative %: 32 %
Platelets: 284 10*3/uL (ref 150–400)
RBC: 3.87 MIL/uL (ref 3.87–5.11)
RDW: 14.5 % (ref 11.5–15.5)
WBC: 6 10*3/uL (ref 4.0–10.5)
nRBC: 0 % (ref 0.0–0.2)

## 2018-07-22 LAB — COMPREHENSIVE METABOLIC PANEL
ALK PHOS: 131 U/L — AB (ref 38–126)
ALT: 38 U/L (ref 0–44)
ANION GAP: 8 (ref 5–15)
AST: 49 U/L — ABNORMAL HIGH (ref 15–41)
Albumin: 3.4 g/dL — ABNORMAL LOW (ref 3.5–5.0)
BUN: 10 mg/dL (ref 8–23)
CHLORIDE: 104 mmol/L (ref 98–111)
CO2: 26 mmol/L (ref 22–32)
Calcium: 8.6 mg/dL — ABNORMAL LOW (ref 8.9–10.3)
Creatinine, Ser: 0.73 mg/dL (ref 0.44–1.00)
GFR calc Af Amer: >60 mL/min (ref >60)
GFR calc non Af Amer: >60 mL/min (ref >60)
GLUCOSE: 254 mg/dL — AB (ref 70–99)
POTASSIUM: 3.4 mmol/L — AB (ref 3.5–5.1)
Sodium: 138 mmol/L (ref 135–145)
TOTAL PROTEIN: 6.4 g/dL — AB (ref 6.5–8.1)
Total Bilirubin: 0.7 mg/dL (ref 0.3–1.2)

## 2018-07-22 LAB — BRAIN NATRIURETIC PEPTIDE: B Natriuretic Peptide: 985.8 pg/mL — ABNORMAL HIGH (ref 0.0–100.0)

## 2018-07-22 LAB — TROPONIN I

## 2018-07-22 LAB — D-DIMER, QUANTITATIVE: D-Dimer, Quant: 2.87 ug/mL-FEU — ABNORMAL HIGH (ref 0.00–0.50)

## 2018-07-22 MED ORDER — FUROSEMIDE 10 MG/ML IJ SOLN
40.0000 mg | Freq: Once | INTRAMUSCULAR | Status: AC
  Administered 2018-07-22: 40 mg via INTRAVENOUS
  Filled 2018-07-22: qty 4

## 2018-07-22 MED ORDER — SODIUM CHLORIDE 0.9 % IV SOLN: INTRAVENOUS | Status: DC

## 2018-07-22 MED ORDER — IOPAMIDOL (ISOVUE-370) INJECTION 76%
100.0000 mL | Freq: Once | INTRAVENOUS | Status: AC | PRN
  Administered 2018-07-22: 100 mL via INTRAVENOUS

## 2018-07-22 MED ORDER — IPRATROPIUM-ALBUTEROL 0.5-2.5 (3) MG/3ML IN SOLN
3.0000 mL | Freq: Four times a day (QID) | RESPIRATORY_TRACT | Status: DC
  Administered 2018-07-22 – 2018-07-24 (×6): 3 mL via RESPIRATORY_TRACT
  Filled 2018-07-22 (×6): qty 3

## 2018-07-22 MED ORDER — SODIUM CHLORIDE 0.9 % IV BOLUS
500.0000 mL | Freq: Once | INTRAVENOUS | Status: AC
  Administered 2018-07-22: 500 mL via INTRAVENOUS

## 2018-07-22 NOTE — ED Provider Notes (Signed)
[facility] EMERGENCY DEPARTMENT Provider Note   CSN: [id number] Arrival date & time: 07/22/18  1856     History   Chief Complaint Chief Complaint  Patient presents with  . Shortness of Breath    HPI [name] is a 63 y.o. female.  Patient presenting with a week and a half history of shortness of breath.  For about 5 days had some mild discomfort in lower part of the chest.  But nothing severe or worse today.  Patient states that about 2 weeks ago she did have some increased pain in the epigastric area.  That lasted for a couple days.  Patient states that she has more shortness of breath when she lays down.  She needs to sleep sitting up.     Past Medical History:  Diagnosis Date  . Allergy   . Anxiety   . Arthritis   . Bipolar 2 disorder ([place])   . Cataract   . Chest pain   . Depression   . DM (diabetes mellitus), type 2 ([place])   . GERD (gastroesophageal reflux disease)   . HTN (hypertension)   . Hypercholesteremia   . Marijuana abuse   . Trigger finger    r index inj 12/08, bilateral thumb inj 11/09, r thumb and dequervain's inj 01/2009    Patient Active Problem List   Diagnosis Date Noted  . Trigger finger 03/04/2018  . Diabetes mellitus due to underlying condition, uncontrolled, with diabetic neuropathy, with long-term current use of insulin ([place]) 03/04/2018  . Healthcare maintenance 10/29/2017  . COPD with emphysema ([place]) 02/06/2017  . 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 Poor appetite 10/04/2015  . Personal history of colonic polyps 09/13/2015  . Tardive dyskinesia 06/19/2014  . Cataracts, bilateral 03/21/2014  . Chronic diastolic CHF (congestive heart failure) ([place]) 07/26/2013  . Primary hypertension 08/12/2012  . Other bipolar disorder ([place]) 08/23/2009  . Fibromyalgia 08/06/2009  . Gastroparesis 03/22/2009  . Peripheral autonomic neuropathy due to DM ([place]) 01/29/2007  . Tobacco use disorder 08/18/2006  . Hyperlipidemia due  to type 2 diabetes mellitus ([place]) 08/13/2006  . Depression, major, recurrent ([place]) 08/13/2006    Past Surgical History:  Procedure Laterality Date  . ABDOMINAL HYSTERECTOMY  1986   fibroids  . CARDIAC CATHETERIZATION  05/06/2004   normal LV fxn EF>60%  . CYSTECTOMY     back of head  . LEFT HEART CATHETERIZATION WITH CORONARY ANGIOGRAM N/A 05/26/2011   Procedure: LEFT HEART CATHETERIZATION WITH CORONARY ANGIOGRAM;  Surgeon: Larey Dresser, MD;  Location: [facility] CATH LAB;  Service: Cardiovascular;  Laterality: N/A;  radial  . perirpheral iridectomy  02/12/2005     OB History   No obstetric history on file.      Home Medications    Prior to Admission medications   Medication Sig Start Date End Date Taking? Authorizing Provider  atorvastatin (LIPITOR) 40 MG tablet Take 1 tablet (40 mg total) by mouth daily. 03/04/18  Yes [place] Bing, DO  Blood Glucose Monitoring Suppl (ONE TOUCH ULTRA 2) W/DEVICE KIT 1 kit by Does not apply route once. 04/11/15  Yes Karamalegos, Devonne Doughty, DO  candesartan (ATACAND) 4 MG tablet Take 1 tablet (4 mg total) by mouth daily. 10/29/17  Yes Winfrey, Alcario Drought, MD  gabapentin (NEURONTIN) 100 MG capsule Take 1 capsule (100 mg total) by mouth 4 (four) times daily. 10/02/16  Yes [place] Bing, DO  insulin aspart (NOVOLOG) 100 UNIT/ML injection Inject 4-8 Units  into the skin 2 (two) times daily with a meal. 09/10/17  Yes Hensel, Jamal Collin, MD  insulin degludec (TRESIBA FLEXTOUCH) 100 UNIT/ML SOPN FlexTouch Pen Inject 0.26 mLs (26 Units total) into the skin daily. 09/10/17  Yes Hensel, Jamal Collin, MD  Lancets [facility] ULTRASOFT) lancets Use as instructed 04/11/15  Yes Karamalegos, Devonne Doughty, DO  metoprolol succinate (TOPROL-XL) 50 MG 24 hr tablet TAKE ONE TABLET BY MOUTH ONCE DAILY WITH  OR  IMMEDIATELY  FOLLOWING  A  MEAL 03/04/18  Yes [place] Bing, DO  mirtazapine (REMERON) 15 MG tablet Take 1 tablet (15 mg total) by mouth at bedtime. 10/02/16  Yes  [place] Bing, DO  nicotine (NICODERM CQ - DOSED IN MG/24 HOURS) 14 mg/24hr patch Place 1 patch (14 mg total) onto the skin daily. 08/13/17  Yes Hensel, Jamal Collin, MD  nicotine polacrilex (NICORETTE) 2 MG gum Take 1 each (2 mg total) by mouth as needed for smoking cessation. 08/13/17  Yes Hensel, Jamal Collin, MD  ONE TOUCH LANCETS MISC 1 Device by Does not apply route 3 (three) times daily. 09/22/13  Yes Melony Overly, MD  ranitidine (ZANTAC) 150 MG tablet Take 2 tablets (300 mg total) by mouth 2 (two) times daily. Patient taking differently: Take 450 mg by mouth daily.  12/11/16  Yes Hensel, Jamal Collin, MD  glucose blood (ONE TOUCH ULTRA TEST) test strip Test 3 times a day. 05/22/17   [place] Bing, DO  metFORMIN (GLUCOPHAGE-XR) 750 MG 24 hr tablet Take 1 tablet (750 mg total) by mouth daily with breakfast. 10/29/17   Kathrene Alu, MD    Family History Family History  Problem Relation Age of Onset  . Diabetes Mother   . Heart disease Mother   . Heart attack Mother   . Bipolar disorder Mother   . Diabetes Father   . Bipolar disorder Sister   . Diabetes Sister   . Bipolar disorder Brother   . Diabetes Brother   . Asthma Son   . Diabetes Sister   . Diabetes Sister   . Hypertension Sister   . Diabetes Sister   . Diabetes Sister   . Diabetes Sister   . Diabetes Brother   . Diabetes Brother   . Diabetes Brother   . Asthma Son   . Colon cancer Neg Hx   . Esophageal cancer Neg Hx     Social History Social History   Tobacco Use  . Smoking status: Current Every Day Smoker    Packs/day: 0.50    Years: 43.00    Pack years: 21.50    Types: Cigarettes  . Smokeless tobacco: Never Used  Substance Use Topics  . Alcohol use: No    Alcohol/week: 0.0 standard drinks  . Drug use: No    Frequency: 7.0 times per week    Types: Marijuana    Comment: not any longer pt denies     Allergies   Ibuprofen and Lisinopril   Review of Systems Review of Systems  Constitutional:  Negative for chills and fever.  HENT: Negative for rhinorrhea and sore throat.   Eyes: Negative for visual disturbance.  Respiratory: Positive for shortness of breath. Negative for cough.   Cardiovascular: Negative for chest pain and leg swelling.  Gastrointestinal: Negative for abdominal pain, diarrhea, nausea and vomiting.  Genitourinary: Negative for dysuria.  Musculoskeletal: Negative for back pain and neck pain.  Skin: Negative for rash.  Neurological: Negative for dizziness, light-headedness and headaches.  Hematological: Does  not bruise/bleed easily.  Psychiatric/Behavioral: Negative for confusion.     Physical Exam Updated Vital Signs BP 115/75   Pulse (!) 102   Temp 97.6 F (36.4 C) (Oral)   Resp (!) 26   Ht 1.575 m (_0 )   Wt 67.1 kg   SpO2 99%   BMI 27.07 kg/m   Physical Exam Vitals signs and nursing note reviewed.  Constitutional:      General: She is not in acute distress.    Appearance: Normal appearance. She is well-developed.  HENT:     Head: Normocephalic and atraumatic.     Mouth/Throat:     Mouth: Mucous membranes are moist.  Eyes:     Extraocular Movements: Extraocular movements intact.     Conjunctiva/sclera: Conjunctivae normal.     Pupils: Pupils are equal, round, and reactive to light.  Neck:     Musculoskeletal: Normal range of motion and neck supple.  Cardiovascular:     Rate and Rhythm: Normal rate and regular rhythm.     Heart sounds: Normal heart sounds. No murmur.  Pulmonary:     Effort: Pulmonary effort is normal. No respiratory distress.     Breath sounds: Normal breath sounds.  Chest:     Chest wall: No tenderness.  Abdominal:     General: Bowel sounds are normal.     Palpations: Abdomen is soft.     Tenderness: There is no abdominal tenderness.  Musculoskeletal:        General: No swelling.  Skin:    General: Skin is warm and dry.  Neurological:     General: No focal deficit present.     Mental Status: She is alert and  oriented to person, place, and time.      ED Treatments / Results  Labs (all labs ordered are listed, but only abnormal results are displayed) Labs Reviewed  CBC WITH DIFFERENTIAL/PLATELET - Abnormal; Notable for the following components:      Result Value   Hemoglobin 11.8 (*)    All other components within normal limits  COMPREHENSIVE METABOLIC PANEL - Abnormal; Notable for the following components:   Potassium 3.4 (*)    Glucose, Bld 254 (*)    Calcium 8.6 (*)    Total Protein 6.4 (*)    Albumin 3.4 (*)    AST 49 (*)    Alkaline Phosphatase 131 (*)    All other components within normal limits  BRAIN NATRIURETIC PEPTIDE - Abnormal; Notable for the following components:   B Natriuretic Peptide 985.8 (*)    All other components within normal limits  D-DIMER, QUANTITATIVE (NOT AT [facility]) - Abnormal; Notable for the following components:   D-Dimer, Quant 2.87 (*)    All other components within normal limits  TROPONIN I    EKG EKG Interpretation  Date/Time:  Thursday July 22 2018 20:24:30 EST Ventricular Rate:  92 PR Interval:    QRS Duration: 135 QT Interval:  410 QTC Calculation: 508 R Axis:   108 Text Interpretation:  Sinus rhythm Probable left atrial enlargement Anterior infarct, acute (LAD) Prolonged QT interval  Acute MI  Confirmed by Fredia Sorrow [phone]) on 07/22/2018 8:29:07 PM   Radiology Dg Chest 2 View  Result Date: 07/22/2018 CLINICAL DATA:  Shortness of breath for 2 weeks. EXAM: CHEST - 2 VIEW COMPARISON:  10/30/2015 FINDINGS: Borderline heart size with mild pulmonary vascular congestion. Interstitial infiltrates in the lung bases suggest edema or interstitial pneumonitis. Small bilateral pleural effusions. No pneumothorax. Mediastinal contours  appear intact. IMPRESSION: Borderline heart size with mild pulmonary vascular congestion and interstitial edema. Small bilateral pleural effusions. Electronically Signed   By: Lucienne Capers M.D.   On: 07/22/2018  19:41   Ct Angio Chest Pe W/cm &/or Wo Cm  Result Date: 07/22/2018 CLINICAL DATA:  Shortness of breath and mild chest discomfort for 5 days. Positive D-dimer. History of hypertension, diabetes, gastroesophageal reflux disease, tobacco use. EXAM: CT ANGIOGRAPHY CHEST WITH CONTRAST TECHNIQUE: Multidetector CT imaging of the chest was performed using the standard protocol during bolus administration of intravenous contrast. Multiplanar CT image reconstructions and MIPs were obtained to evaluate the vascular anatomy. CONTRAST:  138m ISOVUE-370 IOPAMIDOL (ISOVUE-370) INJECTION 76% COMPARISON:  02/05/2017 FINDINGS: Cardiovascular: Good opacification of the central and segmental pulmonary arteries. No focal filling defects. No evidence of significant pulmonary embolus. Normal caliber thoracic aorta with scattered calcifications. Cardiac enlargement with left heart prominence. No pericardial effusions. Coronary artery calcifications. Mediastinum/Nodes: Esophagus is decompressed. No significant lymphadenopathy in the chest. Lungs/Pleura: Moderate bilateral pleural effusions, greater on the right. Atelectasis or infiltration in the lung bases. Mild interstitial and perihilar infiltration likely due to edema. No pneumothorax. Airways are patent. Upper Abdomen: Reflux of contrast material into the inferior vena cava may indicate right heart failure. Musculoskeletal: No chest wall abnormality. No acute or significant osseous findings. Review of the MIP images confirms the above findings. IMPRESSION: 1. No evidence of significant pulmonary embolus. 2. Cardiac enlargement with bilateral pleural effusions, basilar atelectasis, and perihilar infiltration, likely due to edema. Aortic Atherosclerosis (ICD10-I70.0). Electronically Signed   By: WLucienne CapersM.D.   On: 07/22/2018 21:59    Procedures Procedures (including critical care time)  Medications Ordered in ED Medications  ipratropium-albuterol (DUONEB) 0.5-2.5 (3)  MG/3ML nebulizer solution 3 mL (3 mLs Nebulization Given 07/22/18 2011)  0.9 %  sodium chloride infusion (has no administration in time range)  furosemide (LASIX) injection 40 mg (has no administration in time range)  sodium chloride 0.9 % bolus 500 mL (500 mLs Intravenous New Bag/Given 07/22/18 2110)  iopamidol (ISOVUE-370) 76 % injection 100 mL (100 mLs Intravenous Contrast Given 07/22/18 2135)     Initial Impression / Assessment and Plan / ED Course  I have reviewed the triage vital signs and the nursing notes.  Pertinent labs & imaging results that were available during my care of the patient were reviewed by me and considered in my medical decision making (see chart for details).    Patient EKG the with concern for anterior MI.  EKG reviewed by the on-call cardiology fellow and had discussion with him.  Patient without any history of significant pain this be suggestive of a STEMI.  Patient's initial troponin was negative.  D-dimer was elevated so she had CT angios chest which raises concerns for pulmonary edema.  And her BNP was elevated.  Patient given Lasix here discussed with hospitalist for admission.  Do not feel that there is a distinct acute cardiac element.  The cardiac fellow felt it was may be more consistent with hypertension.  Hospitalist will admit for new onset CHF.  Patient does have a hypoxia component.  Off oxygen with any kind of movement she drops her sats down below 90.  At rest her sats 80 and low 90s.  Patient more comfortable on 2 L.   Final Clinical Impressions(s) / ED Diagnoses   Final diagnoses:  Acute congestive heart failure, unspecified heart failure type ([place]  Hypoxia    ED Discharge Orders    None  Fredia Sorrow, MD 07/22/18 [phone]

## 2018-07-22 NOTE — Progress Notes (Signed)
Called by Dr. Rogene Houston to evaluate ECG for [name].  Briefly this is a 63 year old woman with history of diabetes and hypertension who presented to the emergency room with 1/2 weeks of shortness of breath and 5 days of mild chest discomfort.  Her initial ECG demonstrated some mild ST elevation in V1 and V2 that I was called to assess for potential STEMI.  The ECG demonstrates 1 to 1.5 mm ST elevation in V1 and V2 with no reciprocal changes.  There is some mild QRS widening and notching in leads V5 and V6 consistent with left bundle branch block.  I suspect that the ST elevation is related to this interventricular conduction delay rather than an injury pattern.  The patient's initial troponin is undetectable and d-dimer is elevated at 2.87.  She had a stress test in 2015 which showed no inducible ischemia and normal EF. Dr. Rogene Houston stated he plans to pursue CTA to evaluate for acute PE given predominant symptom of shortness of breath.  I advised him to contact me should she have any other clinical changes concerning for acute MI.  Magda Paganini, MD Cardiology fellow

## 2018-07-22 NOTE — ED Notes (Signed)
[facility] notified ([name]) - patient ready for transport 

## 2018-07-22 NOTE — ED Notes (Signed)
PT back from CT. Mild sob. Sat 92% on r/a 02 at 2l/mn given

## 2018-07-22 NOTE — Plan of Care (Signed)
63 yo F with 5 day h/o SOB, orthopnea.  Abnormal EKG but not STEMI  Trop neg  Work up suggests possibly new onset CHF, BNP 900.  Got lasix in ED.  Will send to card tele obs.

## 2018-07-22 NOTE — ED Notes (Signed)
Patient transported to CT 

## 2018-07-22 NOTE — ED Triage Notes (Signed)
Sob for almost 2 weeks.

## 2018-07-23 ENCOUNTER — Observation Stay (HOSPITAL_BASED_OUTPATIENT_CLINIC_OR_DEPARTMENT_OTHER): Payer: Medicare HMO

## 2018-07-23 DIAGNOSIS — Z86010 Personal history of colonic polyps: Secondary | ICD-10-CM | POA: Diagnosis not present

## 2018-07-23 DIAGNOSIS — E1165 Type 2 diabetes mellitus with hyperglycemia: Secondary | ICD-10-CM | POA: Diagnosis not present

## 2018-07-23 DIAGNOSIS — E440 Moderate protein-calorie malnutrition: Secondary | ICD-10-CM

## 2018-07-23 DIAGNOSIS — I5041 Acute combined systolic (congestive) and diastolic (congestive) heart failure: Secondary | ICD-10-CM | POA: Diagnosis not present

## 2018-07-23 DIAGNOSIS — I340 Nonrheumatic mitral (valve) insufficiency: Secondary | ICD-10-CM

## 2018-07-23 DIAGNOSIS — E785 Hyperlipidemia, unspecified: Secondary | ICD-10-CM | POA: Diagnosis not present

## 2018-07-23 DIAGNOSIS — I471 Supraventricular tachycardia: Secondary | ICD-10-CM | POA: Diagnosis not present

## 2018-07-23 DIAGNOSIS — J439 Emphysema, unspecified: Secondary | ICD-10-CM | POA: Diagnosis present

## 2018-07-23 DIAGNOSIS — I509 Heart failure, unspecified: Secondary | ICD-10-CM | POA: Diagnosis not present

## 2018-07-23 DIAGNOSIS — I083 Combined rheumatic disorders of mitral, aortic and tricuspid valves: Secondary | ICD-10-CM | POA: Diagnosis present

## 2018-07-23 DIAGNOSIS — I5043 Acute on chronic combined systolic (congestive) and diastolic (congestive) heart failure: Secondary | ICD-10-CM | POA: Diagnosis not present

## 2018-07-23 DIAGNOSIS — M797 Fibromyalgia: Secondary | ICD-10-CM | POA: Diagnosis present

## 2018-07-23 DIAGNOSIS — I4581 Long QT syndrome: Secondary | ICD-10-CM | POA: Diagnosis present

## 2018-07-23 DIAGNOSIS — I351 Nonrheumatic aortic (valve) insufficiency: Secondary | ICD-10-CM

## 2018-07-23 DIAGNOSIS — E876 Hypokalemia: Secondary | ICD-10-CM | POA: Diagnosis present

## 2018-07-23 DIAGNOSIS — F17211 Nicotine dependence, cigarettes, in remission: Secondary | ICD-10-CM | POA: Diagnosis present

## 2018-07-23 DIAGNOSIS — E1143 Type 2 diabetes mellitus with diabetic autonomic (poly)neuropathy: Secondary | ICD-10-CM | POA: Diagnosis not present

## 2018-07-23 DIAGNOSIS — I454 Nonspecific intraventricular block: Secondary | ICD-10-CM | POA: Diagnosis present

## 2018-07-23 DIAGNOSIS — K219 Gastro-esophageal reflux disease without esophagitis: Secondary | ICD-10-CM | POA: Diagnosis present

## 2018-07-23 DIAGNOSIS — I361 Nonrheumatic tricuspid (valve) insufficiency: Secondary | ICD-10-CM

## 2018-07-23 DIAGNOSIS — I10 Essential (primary) hypertension: Secondary | ICD-10-CM | POA: Diagnosis not present

## 2018-07-23 DIAGNOSIS — J9601 Acute respiratory failure with hypoxia: Secondary | ICD-10-CM

## 2018-07-23 DIAGNOSIS — I700 Atherosclerosis of aorta: Secondary | ICD-10-CM | POA: Diagnosis present

## 2018-07-23 DIAGNOSIS — E875 Hyperkalemia: Secondary | ICD-10-CM | POA: Diagnosis not present

## 2018-07-23 DIAGNOSIS — R079 Chest pain, unspecified: Secondary | ICD-10-CM | POA: Diagnosis not present

## 2018-07-23 DIAGNOSIS — I5021 Acute systolic (congestive) heart failure: Secondary | ICD-10-CM | POA: Diagnosis not present

## 2018-07-23 DIAGNOSIS — E119 Type 2 diabetes mellitus without complications: Secondary | ICD-10-CM | POA: Diagnosis not present

## 2018-07-23 DIAGNOSIS — F319 Bipolar disorder, unspecified: Secondary | ICD-10-CM | POA: Diagnosis present

## 2018-07-23 DIAGNOSIS — Z794 Long term (current) use of insulin: Secondary | ICD-10-CM | POA: Diagnosis not present

## 2018-07-23 DIAGNOSIS — K3184 Gastroparesis: Secondary | ICD-10-CM | POA: Diagnosis not present

## 2018-07-23 DIAGNOSIS — E7800 Pure hypercholesterolemia, unspecified: Secondary | ICD-10-CM | POA: Diagnosis not present

## 2018-07-23 DIAGNOSIS — Z9114 Patient's other noncompliance with medication regimen: Secondary | ICD-10-CM | POA: Diagnosis not present

## 2018-07-23 DIAGNOSIS — Z6823 Body mass index (BMI) 23.0-23.9, adult: Secondary | ICD-10-CM | POA: Diagnosis not present

## 2018-07-23 DIAGNOSIS — I110 Hypertensive heart disease with heart failure: Secondary | ICD-10-CM | POA: Diagnosis not present

## 2018-07-23 DIAGNOSIS — R0902 Hypoxemia: Secondary | ICD-10-CM | POA: Diagnosis present

## 2018-07-23 DIAGNOSIS — E1169 Type 2 diabetes mellitus with other specified complication: Secondary | ICD-10-CM | POA: Diagnosis not present

## 2018-07-23 DIAGNOSIS — Z720 Tobacco use: Secondary | ICD-10-CM | POA: Diagnosis not present

## 2018-07-23 LAB — BASIC METABOLIC PANEL
Anion gap: 10 (ref 5–15)
BUN: 6 mg/dL — ABNORMAL LOW (ref 8–23)
CO2: 31 mmol/L (ref 22–32)
CREATININE: 0.8 mg/dL (ref 0.44–1.00)
Calcium: 8.8 mg/dL — ABNORMAL LOW (ref 8.9–10.3)
Chloride: 99 mmol/L (ref 98–111)
GFR calc Af Amer: >60 mL/min (ref >60)
GFR calc non Af Amer: >60 mL/min (ref >60)
Glucose, Bld: 332 mg/dL — ABNORMAL HIGH (ref 70–99)
Potassium: 3.8 mmol/L (ref 3.5–5.1)
Sodium: 140 mmol/L (ref 135–145)

## 2018-07-23 LAB — MAGNESIUM: Magnesium: 1.9 mg/dL (ref 1.7–2.4)

## 2018-07-23 LAB — GLUCOSE, CAPILLARY
Glucose-Capillary: 244 mg/dL — ABNORMAL HIGH (ref 70–99)
Glucose-Capillary: 171 mg/dL — ABNORMAL HIGH (ref 70–99)

## 2018-07-23 LAB — CBC
HCT: 37.7 % (ref 36.0–46.0)
Hemoglobin: 12.3 g/dL (ref 12.0–15.0)
MCH: 30.5 pg (ref 26.0–34.0)
MCHC: 32.6 g/dL (ref 30.0–36.0)
MCV: 93.5 fL (ref 80.0–100.0)
Platelets: 297 10*3/uL (ref 150–400)
RBC: 4.03 MIL/uL (ref 3.87–5.11)
RDW: 14 % (ref 11.5–15.5)
WBC: 5.8 10*3/uL (ref 4.0–10.5)
nRBC: 0 % (ref 0.0–0.2)

## 2018-07-23 LAB — INFLUENZA PANEL BY PCR (TYPE A & B)
Influenza A By PCR: NEGATIVE
Influenza B By PCR: NEGATIVE

## 2018-07-23 LAB — ECHOCARDIOGRAM COMPLETE
Height: 62 in
Weight: 2099.2 oz

## 2018-07-23 LAB — LIPID PANEL
CHOL/HDL RATIO: 3.5 ratio
Cholesterol: 166 mg/dL (ref 0–200)
HDL: 48 mg/dL (ref >40)
LDL Cholesterol: 100 mg/dL — ABNORMAL HIGH (ref 0–99)
Triglycerides: 91 mg/dL (ref <150)
VLDL: 18 mg/dL (ref 0–40)

## 2018-07-23 LAB — TROPONIN I
Troponin I: <0.03 ng/mL (ref <0.03)
Troponin I: <0.03 ng/mL (ref <0.03)

## 2018-07-23 LAB — PHOSPHORUS: Phosphorus: 3.1 mg/dL (ref 2.5–4.6)

## 2018-07-23 LAB — TSH: TSH: 2.62 u[IU]/mL (ref 0.350–4.500)

## 2018-07-23 LAB — HEMOGLOBIN A1C
Hgb A1c MFr Bld: 10.6 % — ABNORMAL HIGH (ref 4.8–5.6)
Mean Plasma Glucose: 257.52 mg/dL

## 2018-07-23 MED ORDER — FENTANYL CITRATE (PF) 100 MCG/2ML IJ SOLN
25.0000 ug | Freq: Once | INTRAMUSCULAR | Status: AC
  Administered 2018-07-23: 25 ug via INTRAVENOUS
  Filled 2018-07-23: qty 2

## 2018-07-23 MED ORDER — ENSURE ENLIVE PO LIQD
237.0000 mL | Freq: Three times a day (TID) | ORAL | Status: DC
  Administered 2018-07-23 – 2018-07-27 (×8): 237 mL via ORAL

## 2018-07-23 MED ORDER — INSULIN ASPART 100 UNIT/ML ~~LOC~~ SOLN
0.0000 [IU] | Freq: Every day | SUBCUTANEOUS | Status: DC
  Administered 2018-07-26: 5 [IU] via SUBCUTANEOUS

## 2018-07-23 MED ORDER — ENOXAPARIN SODIUM 40 MG/0.4ML ~~LOC~~ SOLN
40.0000 mg | SUBCUTANEOUS | Status: DC
  Administered 2018-07-23 – 2018-07-27 (×4): 40 mg via SUBCUTANEOUS
  Filled 2018-07-23 (×3): qty 0.4

## 2018-07-23 MED ORDER — FUROSEMIDE 10 MG/ML IJ SOLN
40.0000 mg | Freq: Once | INTRAMUSCULAR | Status: AC
  Administered 2018-07-23: 40 mg via INTRAVENOUS
  Filled 2018-07-23: qty 4

## 2018-07-23 MED ORDER — INSULIN ASPART 100 UNIT/ML ~~LOC~~ SOLN
0.0000 [IU] | Freq: Three times a day (TID) | SUBCUTANEOUS | Status: DC
  Administered 2018-07-23 – 2018-07-24 (×3): 3 [IU] via SUBCUTANEOUS
  Administered 2018-07-24 (×2): 1 [IU] via SUBCUTANEOUS
  Administered 2018-07-25: 5 [IU] via SUBCUTANEOUS
  Administered 2018-07-25: 9 [IU] via SUBCUTANEOUS
  Administered 2018-07-25: 1 [IU] via SUBCUTANEOUS
  Administered 2018-07-26: 5 [IU] via SUBCUTANEOUS
  Administered 2018-07-27: 9 [IU] via SUBCUTANEOUS
  Administered 2018-07-27: 2 [IU] via SUBCUTANEOUS

## 2018-07-23 MED ORDER — INSULIN GLARGINE 100 UNIT/ML ~~LOC~~ SOLN
13.0000 [IU] | Freq: Every day | SUBCUTANEOUS | Status: DC
  Administered 2018-07-23 – 2018-07-25 (×3): 13 [IU] via SUBCUTANEOUS
  Filled 2018-07-23 (×3): qty 0.13

## 2018-07-23 MED ORDER — ACETAMINOPHEN 500 MG PO TABS
1000.0000 mg | ORAL_TABLET | Freq: Once | ORAL | Status: AC
  Administered 2018-07-23: 1000 mg via ORAL
  Filled 2018-07-23: qty 2

## 2018-07-23 MED ORDER — ATORVASTATIN CALCIUM 80 MG PO TABS
80.0000 mg | ORAL_TABLET | Freq: Every morning | ORAL | Status: DC
  Administered 2018-07-24 – 2018-07-26 (×3): 80 mg via ORAL
  Filled 2018-07-23 (×3): qty 1

## 2018-07-23 MED ORDER — METOPROLOL TARTRATE 25 MG PO TABS
25.0000 mg | ORAL_TABLET | Freq: Two times a day (BID) | ORAL | Status: DC
  Administered 2018-07-23 – 2018-07-24 (×3): 25 mg via ORAL
  Filled 2018-07-23 (×3): qty 1

## 2018-07-23 MED ORDER — ADULT MULTIVITAMIN W/MINERALS CH
1.0000 | ORAL_TABLET | Freq: Every day | ORAL | Status: DC
  Administered 2018-07-23 – 2018-07-27 (×5): 1 via ORAL
  Filled 2018-07-23 (×5): qty 1

## 2018-07-23 MED ORDER — GABAPENTIN 100 MG PO CAPS: 100.0000 mg | ORAL_CAPSULE | Freq: Three times a day (TID) | ORAL | Status: DC | PRN

## 2018-07-23 MED ORDER — METHOCARBAMOL 500 MG PO TABS
1000.0000 mg | ORAL_TABLET | Freq: Once | ORAL | Status: AC
  Administered 2018-07-23: 1000 mg via ORAL
  Filled 2018-07-23: qty 2

## 2018-07-23 MED ORDER — METHOCARBAMOL 1000 MG/10ML IJ SOLN
INTRAMUSCULAR | Status: AC
  Filled 2018-07-23: qty 10

## 2018-07-23 MED ORDER — ENSURE ENLIVE PO LIQD
237.0000 mL | Freq: Two times a day (BID) | ORAL | Status: DC
  Administered 2018-07-23 (×2): 237 mL via ORAL

## 2018-07-23 MED ORDER — MIRTAZAPINE 15 MG PO TABS
15.0000 mg | ORAL_TABLET | Freq: Every day | ORAL | Status: DC
  Administered 2018-07-23 – 2018-07-26 (×4): 15 mg via ORAL
  Filled 2018-07-23 (×4): qty 1

## 2018-07-23 MED ORDER — POTASSIUM CHLORIDE CRYS ER 20 MEQ PO TBCR
40.0000 meq | EXTENDED_RELEASE_TABLET | Freq: Once | ORAL | Status: AC
  Administered 2018-07-23: 40 meq via ORAL
  Filled 2018-07-23: qty 2

## 2018-07-23 MED ORDER — ATORVASTATIN CALCIUM 40 MG PO TABS
40.0000 mg | ORAL_TABLET | Freq: Every morning | ORAL | Status: DC
  Administered 2018-07-23: 40 mg via ORAL
  Filled 2018-07-23: qty 1

## 2018-07-23 NOTE — Evaluation (Signed)
Physical Therapy Evaluation & Discharge Patient Details Name: [name] MRN: [id number] DOB: 02-23-1956 Today's Date: 07/23/2018   History of Present Illness  Pt is a 63 y.o. female admitted 07/22/18 with dyspnea concerning for possible new CHF. PMH includes DM2, HTN, fibromyalgia, depression.     Clinical Impression  Patient evaluated by Physical Therapy with no further acute PT needs identified. PTA, pt indep and lives with husband who works; mobility limited by decreased activity tolerance and SOB. Today, pt indep with ambulation; SpO2 >96% on RA. Dynamic Gait Index Score of 21/24 indicates decreased risk for falls with higher level balance activities. Educ regarding energy conservation, handout provided. All education has been completed and the patient has no further questions. Acute PT is signing off. Thank you for this referral.    Follow Up Recommendations No PT follow up    Equipment Recommendations  None recommended by PT    Recommendations for Other Services       Precautions / Restrictions        Mobility  Bed Mobility Overal bed mobility: Independent                Transfers Overall transfer level: Independent                  Ambulation/Gait Ambulation/Gait assistance: Independent Gait Distance (Feet): 450 Feet Assistive device: None Gait Pattern/deviations: Step-through pattern;Decreased stride length   Gait velocity interpretation: 1.31 - 2.62 ft/sec, indicative of limited community Conservation officer, historic buildings [place] (Stroke Patients Only)       Balance Overall balance assessment: Needs assistance   Sitting balance-Leahy Scale: Normal       Standing balance-Leahy Scale: Good                   Standardized Balance Assessment Standardized Balance Assessment : Dynamic Gait Index   Dynamic Gait Index Level Surface: Normal Change in Gait Speed: Normal Gait with Horizontal Head  Turns: Mild Impairment Gait with Vertical Head Turns: Mild Impairment Gait and Pivot Turn: Normal Step Over Obstacle: Normal Step Around Obstacles: Normal Steps: Mild Impairment Total Score: 21       Pertinent Vitals/Pain Pain Assessment: No/denies pain    Home Living Family/patient expects to be discharged to:: Private residence Living Arrangements: Spouse/significant other Available Help at Discharge: Family;Available PRN/intermittently Type of Home: House       Home Layout: Two level;Laundry or work area in basement   Additional Comments: Husband works during day, pt alone during this time but son lives nearby    Prior Function Level of Independence: Independent         Comments: Does not wear home O2     Hand Dominance        Extremity/Trunk Assessment   Upper Extremity Assessment Upper Extremity Assessment: Overall WFL for tasks assessed    Lower Extremity Assessment Lower Extremity Assessment: Overall WFL for tasks assessed    Cervical / Trunk Assessment Cervical / Trunk Assessment: Normal  Communication   Communication: No difficulties  Cognition Arousal/Alertness: Awake/alert Behavior During Therapy: WFL for tasks assessed/performed Overall Cognitive Status: Within Functional Limits for tasks assessed                                        General Comments General  comments (skin integrity, edema, etc.): Provided energy conservation handout    Exercises     Assessment/Plan    PT Assessment Patent does not need any further PT services  PT Problem List         PT Treatment Interventions      PT Goals (Current goals can be found in the Care Plan section)  Acute Rehab PT Goals PT Goal Formulation: All assessment and education complete, DC therapy    Frequency     Barriers to discharge        Co-evaluation               AM-PAC PT "6 Clicks" Mobility  Outcome Measure Help needed turning from your back to  your side while in a flat bed without using bedrails?: None Help needed moving from lying on your back to sitting on the side of a flat bed without using bedrails?: None Help needed moving to and from a bed to a chair (including a wheelchair)?: None Help needed standing up from a chair using your arms (e.g., wheelchair or bedside chair)?: None Help needed to walk in hospital room?: None Help needed climbing 3-5 steps with a railing? : None 6 Click Score: 24    End of Session Equipment Utilized During Treatment: Gait belt Activity Tolerance: Patient tolerated treatment well Patient left: in bed;with call bell/phone within reach;with family/visitor present Nurse Communication: Mobility status PT Visit Diagnosis: Other abnormalities of gait and mobility (R26.89)    Time: 7628-3151 PT Time Calculation (min) (ACUTE ONLY): 15 min   Charges:   PT Evaluation $PT Eval Low Complexity: [place], PT, DPT Acute Rehabilitation Services  Pager [phone] Office [place] 07/23/2018, 4:17 PM

## 2018-07-23 NOTE — Progress Notes (Signed)
  Echocardiogram 2D Echocardiogram has been performed.  [name] 07/23/2018, 5:07 PM
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
## 2018-07-23 NOTE — Progress Notes (Signed)
Initial Nutrition Assessment  DOCUMENTATION CODES:   Non-severe (moderate) malnutrition in context of chronic illness  INTERVENTION:   -Downgrade diet to dysphagia 2 (mechanical soft) for ease of intake due to poor dentition -MVI with minerals daily -Increase Ensure Enlive po TID, each supplement provides 350 kcal and 20 grams of protein  NUTRITION DIAGNOSIS:   Moderate Malnutrition related to chronic illness(CHF) as evidenced by percent weight loss, energy intake < or equal to 75% for > or equal to 1 month, mild fat depletion, moderate fat depletion, mild muscle depletion, moderate muscle depletion.  GOAL:   Patient will meet greater than or equal to 90% of their needs  MONITOR:   PO intake, Supplement acceptance, Labs, Weight trends, Skin, I & O's  REASON FOR ASSESSMENT:   Malnutrition Screening Tool    ASSESSMENT:   Patient presenting with a week and a half history of shortness of breath.  For about 5 days had some mild discomfort in lower part of the chest.  But nothing severe or worse today.  Patient states that about 2 weeks ago she did have some increased pain in the epigastric area.  That lasted for a couple days.  Patient states that she has more shortness of breath when she lays down.  She needs to sleep sitting up.  Pt admitted with SOB, orthopnea, and possible new onset CHF.   Reviewed I/O's: -2.3 L since admission  Spoke with pt at bedside, who reports a general decline in health over the past 6 months, but more so over the past 3 months since dental extractions. Pt shares it has been progressively more difficult for her to eat secondary to ill fitting dentures and an abscess on the roof of her mouth. Over the past 3 months, pt estimates she consumes one meal per day ("if I'm lucky"). Meals typically consist of a sandwich or a small portion of shredded chicken. Pt has also been trying to add Ensure supplements 1-2 times per day, however, this is not always consistent  due to cost.   Pt reports her UBW is 150#. She estimates she has lost 20-25# within the past 3 months. Pt shares she has lost additional weight with lasix, however, "I don't know where I have any additional weight or fluid on me". Pt expressed frustration over her inability to do normal ADLs with getting fatigued or SOB. She shares simple tasks such as showering or going to the grocery store are grueling tasks for her. Per wt hx, pt has experienced a 9.6% wt loss over the past 3 months, which is significant for time frame.   Discussed ways pt could increase oral intake. Suggested small, frequent meals to help reach nutritional goals. Also discussed mechanically altering her diet for ease of intake. Pt does not want to downgrade to dysphagia 1 (puree), but amenable to mechanical soft (dysphagia 2). Pt amenable to continue Ensure supplements.   Labs reviewed: K: 3.4.   NUTRITION - FOCUSED PHYSICAL EXAM:    Most Recent Value  Orbital Region  Moderate depletion  Upper Arm Region  Moderate depletion  Thoracic and Lumbar Region  Mild depletion  Buccal Region  Mild depletion  Temple Region  Moderate depletion  Clavicle Bone Region  Moderate depletion  Clavicle and Acromion Bone Region  Moderate depletion  Scapular Bone Region  Moderate depletion  Dorsal Hand  Moderate depletion  Patellar Region  Moderate depletion  Anterior Thigh Region  Moderate depletion  Posterior Calf Region  Moderate depletion  Edema (  RD Assessment)  None  Hair  Reviewed  Eyes  Reviewed  Mouth  Reviewed  Skin  Reviewed  Nails  Reviewed       Diet Order:   Diet Order            DIET DYS 2 Room service appropriate? Yes; Fluid consistency: Thin  Diet effective now              EDUCATION NEEDS:   Education needs have been addressed  Skin:  Skin Assessment: Reviewed RN Assessment  Last BM:  07/21/18  Height:   Ht Readings from Last 1 Encounters:  07/23/18 5\' 2"  (1.575 m)    Weight:   Wt Readings from  Last 1 Encounters:  07/23/18 59.5 kg    Ideal Body Weight:  50 kg  BMI:  Body mass index is 24 kg/m.  Estimated Nutritional Needs:   Kcal:  1800-2000  Protein:  90-105 grams  Fluid:  1.8-2.0 L    Gracen Ringwald A. Jimmye Norman, RD, LDN, CDE Pager: [phone] After hours Pager: [phone]

## 2018-07-23 NOTE — Progress Notes (Signed)
The 10-year ASCVD risk score Mikey Bussing DC Brooke Bonito., et al., 2013) is: 47.2%   Values used to calculate the score:     Age: 63 years     Sex: Female     Is Non-Hispanic African American: Yes     Diabetic: Yes     Tobacco smoker: Yes     Systolic Blood Pressure: 675 mmHg     Is BP treated: Yes     HDL Cholesterol: 48 mg/dL     Total Cholesterol: 166 mg/dL

## 2018-07-24 DIAGNOSIS — R079 Chest pain, unspecified: Secondary | ICD-10-CM

## 2018-07-24 DIAGNOSIS — I5041 Acute combined systolic (congestive) and diastolic (congestive) heart failure: Secondary | ICD-10-CM

## 2018-07-24 DIAGNOSIS — Z794 Long term (current) use of insulin: Secondary | ICD-10-CM | POA: Diagnosis not present

## 2018-07-24 DIAGNOSIS — E119 Type 2 diabetes mellitus without complications: Secondary | ICD-10-CM | POA: Diagnosis not present

## 2018-07-24 DIAGNOSIS — I509 Heart failure, unspecified: Secondary | ICD-10-CM | POA: Diagnosis not present

## 2018-07-24 DIAGNOSIS — E440 Moderate protein-calorie malnutrition: Secondary | ICD-10-CM | POA: Diagnosis not present

## 2018-07-24 DIAGNOSIS — I10 Essential (primary) hypertension: Secondary | ICD-10-CM

## 2018-07-24 DIAGNOSIS — E7800 Pure hypercholesterolemia, unspecified: Secondary | ICD-10-CM

## 2018-07-24 DIAGNOSIS — I471 Supraventricular tachycardia: Secondary | ICD-10-CM | POA: Diagnosis not present

## 2018-07-24 DIAGNOSIS — I5021 Acute systolic (congestive) heart failure: Secondary | ICD-10-CM | POA: Diagnosis not present

## 2018-07-24 DIAGNOSIS — I5043 Acute on chronic combined systolic (congestive) and diastolic (congestive) heart failure: Secondary | ICD-10-CM | POA: Diagnosis not present

## 2018-07-24 DIAGNOSIS — Z720 Tobacco use: Secondary | ICD-10-CM | POA: Diagnosis not present

## 2018-07-24 DIAGNOSIS — I340 Nonrheumatic mitral (valve) insufficiency: Secondary | ICD-10-CM | POA: Diagnosis not present

## 2018-07-24 DIAGNOSIS — E875 Hyperkalemia: Secondary | ICD-10-CM | POA: Diagnosis not present

## 2018-07-24 LAB — CBC
HEMATOCRIT: 39.8 % (ref 36.0–46.0)
HEMOGLOBIN: 12.6 g/dL (ref 12.0–15.0)
MCH: 29.9 pg (ref 26.0–34.0)
MCHC: 31.7 g/dL (ref 30.0–36.0)
MCV: 94.5 fL (ref 80.0–100.0)
Platelets: 281 10*3/uL (ref 150–400)
RBC: 4.21 MIL/uL (ref 3.87–5.11)
RDW: 14.1 % (ref 11.5–15.5)
WBC: 6.7 10*3/uL (ref 4.0–10.5)
nRBC: 0 % (ref 0.0–0.2)

## 2018-07-24 LAB — BASIC METABOLIC PANEL
Anion gap: 9 (ref 5–15)
BUN: 12 mg/dL (ref 8–23)
CO2: 30 mmol/L (ref 22–32)
Calcium: 9.3 mg/dL (ref 8.9–10.3)
Chloride: 102 mmol/L (ref 98–111)
Creatinine, Ser: 0.96 mg/dL (ref 0.44–1.00)
GFR calc Af Amer: >60 mL/min (ref >60)
GFR calc non Af Amer: >60 mL/min (ref >60)
Glucose, Bld: 145 mg/dL — ABNORMAL HIGH (ref 70–99)
Potassium: 4.3 mmol/L (ref 3.5–5.1)
Sodium: 141 mmol/L (ref 135–145)

## 2018-07-24 LAB — GLUCOSE, CAPILLARY
Glucose-Capillary: 125 mg/dL — ABNORMAL HIGH (ref 70–99)
Glucose-Capillary: 225 mg/dL — ABNORMAL HIGH (ref 70–99)
Glucose-Capillary: 132 mg/dL — ABNORMAL HIGH (ref 70–99)
Glucose-Capillary: 131 mg/dL — ABNORMAL HIGH (ref 70–99)

## 2018-07-24 LAB — TROPONIN I
Troponin I: <0.03 ng/mL (ref <0.03)
Troponin I: <0.03 ng/mL (ref <0.03)

## 2018-07-24 MED ORDER — IPRATROPIUM-ALBUTEROL 0.5-2.5 (3) MG/3ML IN SOLN: 3.0000 mL | Freq: Four times a day (QID) | RESPIRATORY_TRACT | Status: DC | PRN

## 2018-07-24 MED ORDER — FUROSEMIDE 10 MG/ML IJ SOLN
40.0000 mg | Freq: Once | INTRAMUSCULAR | Status: AC
  Administered 2018-07-24: 40 mg via INTRAVENOUS
  Filled 2018-07-24: qty 4

## 2018-07-24 MED ORDER — FUROSEMIDE 10 MG/ML IJ SOLN
40.0000 mg | Freq: Two times a day (BID) | INTRAMUSCULAR | Status: DC
  Administered 2018-07-24 – 2018-07-25 (×2): 40 mg via INTRAVENOUS
  Filled 2018-07-24: qty 4

## 2018-07-24 MED ORDER — ASPIRIN EC 81 MG PO TBEC
81.0000 mg | DELAYED_RELEASE_TABLET | Freq: Every day | ORAL | Status: DC
  Administered 2018-07-24 – 2018-07-25 (×2): 81 mg via ORAL
  Filled 2018-07-24 (×2): qty 1

## 2018-07-24 MED ORDER — CARVEDILOL 3.125 MG PO TABS
3.1250 mg | ORAL_TABLET | Freq: Two times a day (BID) | ORAL | Status: DC
  Administered 2018-07-24 – 2018-07-25 (×2): 3.125 mg via ORAL
  Filled 2018-07-24 (×2): qty 1

## 2018-07-24 NOTE — Care Management Obs Status (Signed)
[facility] NOTIFICATION   Patient Details  Name: [name] MRN: [id number] Date of Birth: August 26, 1955   Medicare Observation Status Notification Given:  Yes    Carles Collet, RN 07/24/2018, 10:30 AM

## 2018-07-24 NOTE — Progress Notes (Signed)
Family Medicine Teaching Service Daily Progress Note Intern Pager: [phone]  Patient name: [name] Medical record number: [id number] Date of birth: 16-Mar-1956 Age: 63 y.o. Gender: female  Primary Care Provider: [place] Bing, DO Consultants: Cardiology Code Status: Full   Pt Overview and Major Events to Date:  2/7- New diagnosis of HFrEF  Assessment and Plan: [name] is a 63 y.o. female with a past medical history significant for T2DM, HTN, fibromyalgia, depression who presented with dyspnea with a new diagnosis of HFrEF based on Echo.  #Acute respiratory failure, new diagnosis of HFrEF Patient satting 91% on room air likely in the setting of volume overload. CTA was negative for PE but did show significant cardiac enlargement with bilateral pleural effusions, basilar atelectasis, and perihilar infiltration likely due to edema. Initial BNP 985.8. Patient is currently being diuresis and expect improvement in respiratory status with better volume control. Could also be a component of COPD though no wheezing or controller meds. --Lasix 40 mg this morning will likely need increase frequency, will f/u on Cardiology recs --Continue strict intake and output and daily weights --O2 as needed to maintain Sat >88% - will add AM labs - check echo - ambulate with PT -wean to room air as tolerated  #HFrEF new diagnosis On admission patient BNP was 985.8, she also had significant significant dyspnea with crackles on lung exam and evidence of pulmonary edema on CTA and cardiomegaly consistent with volume overload. Echo showed an EF of 45-50% with mild diastolic dysfunction. Left atrium was severely dilated with evidence of moderate mitral valve regurgitation the setting of partial mitral valve restriction. Patient is -2.8L with 2 dose of 40 IV lasix. Weight 131>130lbs. --F/u on cardiology consult, appreciate recs  --F/u on TEE --IV lasix 40 mg  --I/O, daily weights.      #Hypokalemia, improving On admission 3.4>4.3. Mg 1.9 and phos 3.1 --Follow up on am BMP   #T2DM  Last a1c 02/2018 was 8.6. Home meds include sliding scale insulin, metformin, tresiba 26u daily. Endorses lack of adherence to meds at home over last 2 weeks due to dyspnea.  A1c 10.6 in may 2019.  --Continue Lantus 13u, titrate up as needed --Sensitive sliding scale insulin  #Bipolar Disorder   Well controlled on home meds --Continue Remeron 15 mg daily   #HTN BP this am 125/70. Home meds include metoprolol 50 mg q24, candesartan 4 mg daily --Currently on metoprolol tartrate 25 mg BID, could restart succ will f/u on cards --Continue to hold candesartan in setting of diuresis   #Fibromyalgia  gabapentin 100 mg QID on med list. Restarted as PRN.  FEN/GI: Heart healthy diet Prophylaxis: lovenox   Disposition: Pending cardiac workup   Subjective:  Patient is feeling better this morning, shortness has improved but still endorse some with exertion. Denies any chest pain, palpitations, abdominal pain, headaches, dizziness  Objective: Temp:  [98.4 F (36.9 C)-98.6 F (37 C)] 98.6 F (37 C) (02/08 0440) Pulse Rate:  [91-100] 91 (02/08 0440) Resp:  [18-20] 20 (02/08 0440) BP: (125-159)/(70-83) 125/70 (02/08 0440) SpO2:  [91 %-100 %] 91 % (02/08 0821) Weight:  [59.3 kg] 59.3 kg (02/08 0440)   Physical Exam: General: NAD, resting comfortably in bed Eyes: EOMI ENTM: mucous membranes moist Neck: full ROM Cardiovascular: RRR, no m/r/g, no edema Respiratory: Bilateral lower lung crackles. No wheezes. Comfortable work of breathing. Speaks full sentences Gastrointestinal: soft, nt, nd MSK: moves 4 extremities equally Derm: no rashes or lesions Neuro: CN II-XII  itnact Psych: AAO, appropriate  Laboratory: Recent Labs  Lab 07/22/18 2017 07/23/18 1122 07/24/18 0655  WBC 6.0 5.8 6.7  HGB 11.8* 12.3 12.6  HCT 36.9 37.7 39.8  PLT 284 297 281   Recent Labs  Lab  07/22/18 2017 07/23/18 1122 07/24/18 0655  NA 138 140 141  K 3.4* 3.8 4.3  CL 104 99 102  CO2 26 31 30   BUN 10 6* 12  CREATININE 0.73 0.80 0.96  CALCIUM 8.6* 8.8* 9.3  PROT 6.4*  --   --   BILITOT 0.7  --   --   ALKPHOS 131*  --   --   ALT 38  --   --   AST 49*  --   --   GLUCOSE 254* 332* 145*   Trop: 0.03 (x3)  EKG with ST changes turn out to be LBBB discussed with Cardiology on admission   Imaging/Diagnostic Tests:  Dg Chest 2 View Borderline heart size with mild pulmonary vascular congestion and interstitial edema. Small bilateral pleural effusions. Electronically Signed   By: Lucienne Capers M.D.   On: 07/22/2018 19:41   Ct Angio Chest Pe W/cm &/or Wo Cm No evidence of significant pulmonary embolus. 2. Cardiac enlargement with bilateral pleural effusions, basilar atelectasis, and perihilar infiltration, likely due to edema.   Marjie Skiff, MD 07/24/2018, 9:35 AM PGY-3, [place] Intern pager: [phone], text pages welcome

## 2018-07-24 NOTE — Consult Note (Signed)
Cardiology Consultation:   Patient ID: [name] MRN: [id number]; DOB: 08/28/1955  Admit date: 07/22/2018 Date of Consult: 07/24/2018  Primary Care Provider: Northrop Bing, DO Primary Cardiologist: Dr. Radford Pax   Patient Profile:   [name] is a 63 y.o. female with a hx of DM, dyslipidemia, HTN and GERD  who is being seen today for the evaluation of CHF at the request of Dr. Antonieta Loveless.   Previously follow by Dr. Radford Pax in 4034 for diastolic dysfunction and atypical chest pain.  Echo 06/2013 - LVEF of 60-65%, severe LVH and no wm abnormality Stress test 06/2013 - Normal  History of Present Illness:   [name] presented yesterday with progressive worsening dyspnea, orthopnea and lower extremity edema. She does have mild exertional chest discomfort without radiation.  JVD elevated. BNP 985. CT angio negative for PE. Admitted acute respiratory failure. Given IV lasix 40mg  x 2. Diuresed 2.6L. weight down 1 lb. Echo showed LVEF of of 45-50%, moderate diastolic dysfunction; moderate MR, mild to moderate aortic regurgitation.   Patient reports exertional dyspnea with intermittent chest discomfort for many months however symptoms worsen in past 2 weeks.  He has to stop multiple times while walking and going upstairs.  Intermittent palpitation.  She denies syncope, dizziness or melena.  Father had MI in the 78s.  Elder sister also had MI.  Longstanding history of tobacco smoking 0.5 to 1 pack a day since age 75.  She has stopped smoking for past 3 weeks.  Past Medical History:  Diagnosis Date  . Allergy   . Anxiety   . Arthritis   . Bipolar 2 disorder ([place])   . Cataract   . Chest pain   . Depression   . DM (diabetes mellitus), type 2 ([place])   . GERD (gastroesophageal reflux disease)   . HTN (hypertension)   . Hypercholesteremia   . Marijuana abuse   . Trigger finger    r index inj 12/08, bilateral thumb inj 11/09, r thumb and dequervain's inj 01/2009    Past Surgical  History:  Procedure Laterality Date  . ABDOMINAL HYSTERECTOMY  1986   fibroids  . CARDIAC CATHETERIZATION  05/06/2004   normal LV fxn EF>60%  . CYSTECTOMY     back of head  . LEFT HEART CATHETERIZATION WITH CORONARY ANGIOGRAM N/A 05/26/2011   Procedure: LEFT HEART CATHETERIZATION WITH CORONARY ANGIOGRAM;  Surgeon: Larey Dresser, MD;  Location: [facility] CATH LAB;  Service: Cardiovascular;  Laterality: N/A;  radial  . perirpheral iridectomy  02/12/2005     Inpatient Medications: Scheduled Meds: . atorvastatin  80 mg Oral q morning - 10a  . enoxaparin (LOVENOX) injection  40 mg Subcutaneous Q24H  . feeding supplement (ENSURE ENLIVE)  237 mL Oral TID BM  . furosemide  40 mg Intravenous Once  . insulin aspart  0-5 Units Subcutaneous QHS  . insulin aspart  0-9 Units Subcutaneous TID WC  . insulin glargine  13 Units Subcutaneous Daily  . metoprolol tartrate  25 mg Oral BID  . mirtazapine  15 mg Oral QHS  . multivitamin with minerals  1 tablet Oral Daily   Continuous Infusions:  PRN Meds: gabapentin, ipratropium-albuterol  Allergies:    Allergies  Allergen Reactions  . Ibuprofen Other (See Comments)    REACTION: chest tightness and feeling like something stuck in chest  . Lisinopril Cough    Dry hacking cough    Social History:   Social History   Socioeconomic History  . Marital status:  Married    Spouse name: Not on file  . Number of children: Not on file  . Years of education: Not on file  . Highest education level: Not on file  Occupational History  . 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 Financial resource strain: Not on file  . Food insecurity:    Worry: Not on file    Inability: Not on file  . Transportation needs:    Medical: Not on file    Non-medical: Not on file  Tobacco Use  . Smoking status: Current Every Day Smoker    Packs/day: 0.50    Years: 43.00    Pack years: 21.50     Types: Cigarettes  . Smokeless tobacco: Never Used  Substance and Sexual Activity  . Alcohol use: No    Alcohol/week: 0.0 standard drinks  . Drug use: No    Frequency: 7.0 times per week    Types: Marijuana    Comment: not any longer pt denies  . Sexual activity: Never    Partners: Male  Lifestyle  . Physical activity:    Days per week: Not on file    Minutes per session: Not on file  . Stress: Not on file  Relationships  . Social connections:    Talks on phone: Not on file    Gets together: Not on file    Attends religious service: Not on file    Active member of club or organization: Not on file    Attends meetings of clubs or organizations: Not on file    Relationship status: Not on file  . Intimate partner violence:    Fear of current or ex partner: Not on file    Emotionally abused: Not on file    Physically abused: Not on file    Forced sexual activity: Not on file  Other Topics Concern  . Not on file  Social History Narrative   Finances are a major issue. Meeting with [place]. Rejected by healthcare sharing initiative   currently receiving med assistance for lantus through our office and lyrica.      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 Pt in process of obtaining disability.     Family History:    Family History  Problem Relation Age of Onset  . Diabetes Mother   . Heart disease Mother   . Heart attack Mother   . Bipolar disorder Mother   . Diabetes Father   . Bipolar disorder Sister   . Diabetes Sister   . Bipolar disorder Brother   . Diabetes Brother   . Asthma Son   . Diabetes Sister   . Diabetes Sister   . Hypertension Sister   . Diabetes Sister   . Diabetes Sister   . Diabetes Sister   . Diabetes Brother   . Diabetes Brother   . Diabetes Brother   . Asthma Son   . Colon cancer Neg Hx   . Esophageal cancer Neg  Hx      ROS:  Please see the history of present illness.  All other ROS reviewed and negative.     Physical Exam/Data:   Vitals:   07/24/18 0250 07/24/18 0440 07/24/18 0821 07/24/18 1228  BP:  125/70  136/71  Pulse:  91  85  Resp:  20  20  Temp:  98.6 F (37 C)  98.7 F (37.1 C)  TempSrc:  Oral  Oral  SpO2: 96% 91% 91% 98%  Weight:  59.3 kg    Height:        Intake/Output Summary (Last 24 hours) at 07/24/2018 1246 Last data filed at 07/24/2018 1229 Gross per 24 hour  Intake 720 ml  Output 800 ml  Net -80 ml   Last 3 Weights 07/24/2018 07/23/2018 07/22/2018  Weight (lbs) 130 lb 11.2 oz 131 lb 3.2 oz 148 lb  Weight (kg) 59.285 kg 59.512 kg 67.132 kg     Body mass index is 23.91 kg/m.  General:  Thin frail female in no acute distress HEENT: normal Lymph: no adenopathy Neck: no JVD Endocrine:  No thryomegaly Vascular: No carotid bruits; FA pulses 2+ bilaterally without bruits  Cardiac:  normal S1, S2; RRR; 3/6 systolic murmur Lungs:  clear to auscultation bilaterally, no wheezing, rhonchi or rales  Abd: soft, nontender, no hepatomegaly  Ext: no edema Musculoskeletal:  No deformities, BUE and BLE strength normal and equal Skin: warm and dry  Neuro:  CNs 2-12 intact, no focal abnormalities noted Psych:  Normal affect   EKG:  The EKG was personally reviewed and demonstrates: Sinus rhythm at rate of 85 bpm, LVH Telemetry:  Telemetry was personally reviewed and demonstrates:Sinus rhythm at rate of 90s  Relevant CV Studies:  Echo 07/23/18 IMPRESSIONS    1. The left ventricle has mildly reduced systolic function of 00-76%. The cavity size was normal. There is mildly increased left ventricular wall thickness. Echo evidence of pseudonormalization in diastolic relaxation.  2. The right ventricle has normal systolic function. The cavity was normal. There is no increase in right ventricular wall thickness.  3. Left atrial size was severely dilated.  4. Trivial pericardial  effusion.  5. The anterior mitral valve leaflet appears partially restricted. There is mild thickening. Mitral valve regurgitation is moderate by color flow Doppler.  6. PISA measurements are consistent with mild MR. However, visually it is at least moderate. There appears to be mild restriction of the anterior mitral valve leaflet. This can be seen with ischemia or degenerative changes. Would consider cardiology  consultation and consider ischemia evaluation as well as TEE to better evaluate the mitral valve.  7. The tricuspid valve is normal in structure. Tricuspid valve regurgitation is moderate.  8. The aortic valve is normal in structure. There is mild thickening and mild calcification of the aortic valve. Aortic valve regurgitation is mild to moderate by color flow Doppler.  9. The pulmonic valve was normal in structure.  Laboratory Data:  Chemistry Recent Labs  Lab 07/22/18 2017 07/23/18 1122 07/24/18 0655  NA 138 140 141  K 3.4* 3.8 4.3  CL 104 99 102  CO2 26 31 30   GLUCOSE 254* 332* 145*  BUN 10 6* 12  CREATININE 0.73 0.80 0.96  CALCIUM 8.6* 8.8* 9.3  GFRNONAA >60 >60 >60  GFRAA >60 >60 >60  ANIONGAP 8 10 9     Recent Labs  Lab 07/22/18 2017  PROT 6.4*  ALBUMIN 3.4*  AST 49*  ALT 38  ALKPHOS 131*  BILITOT 0.7   Hematology Recent Labs  Lab 07/22/18 2017 07/23/18 1122 07/24/18 0655  WBC 6.0 5.8 6.7  RBC 3.87 4.03 4.21  HGB 11.8* 12.3 12.6  HCT 36.9 37.7 39.8  MCV 95.3 93.5 94.5  MCH 30.5 30.5 29.9  MCHC 32.0 32.6 31.7  RDW 14.5  14.0 14.1  PLT 284 297 281   Cardiac Enzymes Recent Labs  Lab 07/22/18 2017 07/23/18 0958 07/23/18 1828 07/23/18 2359 07/24/18 0655  TROPONINI <0.03 <0.03 <0.03 <0.03 <0.03   No results for input(s): TROPIPOC in the last 168 hours.  BNP Recent Labs  Lab 07/22/18 2004  BNP 985.8*    DDimer  Recent Labs  Lab 07/22/18 2017  DDIMER 2.87*    Radiology/Studies:  Dg Chest 2 View  Result Date: 07/22/2018 CLINICAL  DATA:  Shortness of breath for 2 weeks. EXAM: CHEST - 2 VIEW COMPARISON:  10/30/2015 FINDINGS: Borderline heart size with mild pulmonary vascular congestion. Interstitial infiltrates in the lung bases suggest edema or interstitial pneumonitis. Small bilateral pleural effusions. No pneumothorax. Mediastinal contours appear intact. IMPRESSION: Borderline heart size with mild pulmonary vascular congestion and interstitial edema. Small bilateral pleural effusions. Electronically Signed   By: Lucienne Capers M.D.   On: 07/22/2018 19:41   Ct Angio Chest Pe W/cm &/or Wo Cm  Result Date: 07/22/2018 CLINICAL DATA:  Shortness of breath and mild chest discomfort for 5 days. Positive D-dimer. History of hypertension, diabetes, gastroesophageal reflux disease, tobacco use. EXAM: CT ANGIOGRAPHY CHEST WITH CONTRAST TECHNIQUE: Multidetector CT imaging of the chest was performed using the standard protocol during bolus administration of intravenous contrast. Multiplanar CT image reconstructions and MIPs were obtained to evaluate the vascular anatomy. CONTRAST:  118mL ISOVUE-370 IOPAMIDOL (ISOVUE-370) INJECTION 76% COMPARISON:  02/05/2017 FINDINGS: Cardiovascular: Good opacification of the central and segmental pulmonary arteries. No focal filling defects. No evidence of significant pulmonary embolus. Normal caliber thoracic aorta with scattered calcifications. Cardiac enlargement with left heart prominence. No pericardial effusions. Coronary artery calcifications. Mediastinum/Nodes: Esophagus is decompressed. No significant lymphadenopathy in the chest. Lungs/Pleura: Moderate bilateral pleural effusions, greater on the right. Atelectasis or infiltration in the lung bases. Mild interstitial and perihilar infiltration likely due to edema. No pneumothorax. Airways are patent. Upper Abdomen: Reflux of contrast material into the inferior vena cava may indicate right heart failure. Musculoskeletal: No chest wall abnormality. No acute  or significant osseous findings. Review of the MIP images confirms the above findings. IMPRESSION: 1. No evidence of significant pulmonary embolus. 2. Cardiac enlargement with bilateral pleural effusions, basilar atelectasis, and perihilar infiltration, likely due to edema. Aortic Atherosclerosis (ICD10-I70.0). Electronically Signed   By: Lucienne Capers M.D.   On: 07/22/2018 21:59    Assessment and Plan:   1. Acute combined CHF -BNP 985.  CT angiogram negative for PE but did show edema and aortic atherosclerosis.  Echocardiogram showed mildly reduced LV function to 45 to 19%, grade 2 diastolic dysfunction mild to moderate regurgitation and mild to moderate MR. -She has progressive worsening of dyspnea on exertion, orthopnea and lower extremity edema.  Mild chest discomfort with exertion.  Cardiac risk factor includes hypertension, family history of CAD to multiple family members, DM and tobacco smoking -She diuresis 2.6 later overnight.  Her symptoms improved significantly. - Continue lopressor 25mg  BID, consolidate to succinate or changed to Coreg.  - Will plan to restart home ARB (Candesartan)  2.  Valvular heart disease -As above.  3.  Tobacco abuse -Reports cessation 3 weeks ago.  4.  Hypertension -Blood pressure currently stable on metoprolol.  5. DM - Per primary team   Dr. Radford Pax to see later today.   For questions or updates, please contact [place] Please consult [email or web address] for contact info under     Jarrett Soho, PA  07/24/2018 12:46 PM

## 2018-07-25 DIAGNOSIS — Z6823 Body mass index (BMI) 23.0-23.9, adult: Secondary | ICD-10-CM | POA: Diagnosis not present

## 2018-07-25 DIAGNOSIS — Z86010 Personal history of colonic polyps: Secondary | ICD-10-CM | POA: Diagnosis not present

## 2018-07-25 DIAGNOSIS — I5021 Acute systolic (congestive) heart failure: Secondary | ICD-10-CM | POA: Diagnosis not present

## 2018-07-25 DIAGNOSIS — R079 Chest pain, unspecified: Secondary | ICD-10-CM | POA: Diagnosis not present

## 2018-07-25 DIAGNOSIS — K219 Gastro-esophageal reflux disease without esophagitis: Secondary | ICD-10-CM | POA: Diagnosis present

## 2018-07-25 DIAGNOSIS — E1143 Type 2 diabetes mellitus with diabetic autonomic (poly)neuropathy: Secondary | ICD-10-CM | POA: Diagnosis not present

## 2018-07-25 DIAGNOSIS — E7800 Pure hypercholesterolemia, unspecified: Secondary | ICD-10-CM | POA: Diagnosis not present

## 2018-07-25 DIAGNOSIS — E119 Type 2 diabetes mellitus without complications: Secondary | ICD-10-CM | POA: Diagnosis not present

## 2018-07-25 DIAGNOSIS — I5043 Acute on chronic combined systolic (congestive) and diastolic (congestive) heart failure: Secondary | ICD-10-CM | POA: Diagnosis not present

## 2018-07-25 DIAGNOSIS — I083 Combined rheumatic disorders of mitral, aortic and tricuspid valves: Secondary | ICD-10-CM | POA: Diagnosis present

## 2018-07-25 DIAGNOSIS — Z9114 Patient's other noncompliance with medication regimen: Secondary | ICD-10-CM | POA: Diagnosis not present

## 2018-07-25 DIAGNOSIS — I110 Hypertensive heart disease with heart failure: Secondary | ICD-10-CM | POA: Diagnosis not present

## 2018-07-25 DIAGNOSIS — I509 Heart failure, unspecified: Secondary | ICD-10-CM | POA: Diagnosis not present

## 2018-07-25 DIAGNOSIS — E875 Hyperkalemia: Secondary | ICD-10-CM | POA: Diagnosis not present

## 2018-07-25 DIAGNOSIS — I340 Nonrheumatic mitral (valve) insufficiency: Secondary | ICD-10-CM | POA: Diagnosis not present

## 2018-07-25 DIAGNOSIS — R0902 Hypoxemia: Secondary | ICD-10-CM | POA: Diagnosis present

## 2018-07-25 DIAGNOSIS — E1169 Type 2 diabetes mellitus with other specified complication: Secondary | ICD-10-CM | POA: Diagnosis not present

## 2018-07-25 DIAGNOSIS — I471 Supraventricular tachycardia: Secondary | ICD-10-CM

## 2018-07-25 DIAGNOSIS — I700 Atherosclerosis of aorta: Secondary | ICD-10-CM | POA: Diagnosis present

## 2018-07-25 DIAGNOSIS — E876 Hypokalemia: Secondary | ICD-10-CM | POA: Diagnosis present

## 2018-07-25 DIAGNOSIS — Z720 Tobacco use: Secondary | ICD-10-CM | POA: Diagnosis not present

## 2018-07-25 DIAGNOSIS — K3184 Gastroparesis: Secondary | ICD-10-CM | POA: Diagnosis not present

## 2018-07-25 DIAGNOSIS — M797 Fibromyalgia: Secondary | ICD-10-CM | POA: Diagnosis present

## 2018-07-25 DIAGNOSIS — I10 Essential (primary) hypertension: Secondary | ICD-10-CM | POA: Diagnosis not present

## 2018-07-25 DIAGNOSIS — J9601 Acute respiratory failure with hypoxia: Secondary | ICD-10-CM | POA: Diagnosis not present

## 2018-07-25 DIAGNOSIS — E440 Moderate protein-calorie malnutrition: Secondary | ICD-10-CM | POA: Diagnosis not present

## 2018-07-25 DIAGNOSIS — I454 Nonspecific intraventricular block: Secondary | ICD-10-CM | POA: Diagnosis present

## 2018-07-25 DIAGNOSIS — I5041 Acute combined systolic (congestive) and diastolic (congestive) heart failure: Secondary | ICD-10-CM | POA: Diagnosis not present

## 2018-07-25 DIAGNOSIS — J439 Emphysema, unspecified: Secondary | ICD-10-CM | POA: Diagnosis present

## 2018-07-25 DIAGNOSIS — Z794 Long term (current) use of insulin: Secondary | ICD-10-CM | POA: Diagnosis not present

## 2018-07-25 DIAGNOSIS — E785 Hyperlipidemia, unspecified: Secondary | ICD-10-CM | POA: Diagnosis not present

## 2018-07-25 DIAGNOSIS — F17211 Nicotine dependence, cigarettes, in remission: Secondary | ICD-10-CM | POA: Diagnosis present

## 2018-07-25 DIAGNOSIS — F319 Bipolar disorder, unspecified: Secondary | ICD-10-CM | POA: Diagnosis present

## 2018-07-25 DIAGNOSIS — E1165 Type 2 diabetes mellitus with hyperglycemia: Secondary | ICD-10-CM | POA: Diagnosis not present

## 2018-07-25 DIAGNOSIS — I4581 Long QT syndrome: Secondary | ICD-10-CM | POA: Diagnosis present

## 2018-07-25 LAB — BASIC METABOLIC PANEL
Anion gap: 12 (ref 5–15)
Anion gap: 11 (ref 5–15)
BUN: 16 mg/dL (ref 8–23)
BUN: 23 mg/dL (ref 8–23)
CO2: 33 mmol/L — ABNORMAL HIGH (ref 22–32)
CO2: 34 mmol/L — ABNORMAL HIGH (ref 22–32)
CREATININE: 1.04 mg/dL — AB (ref 0.44–1.00)
Calcium: 10.3 mg/dL (ref 8.9–10.3)
Calcium: 10.5 mg/dL — ABNORMAL HIGH (ref 8.9–10.3)
Chloride: 96 mmol/L — ABNORMAL LOW (ref 98–111)
Chloride: 92 mmol/L — ABNORMAL LOW (ref 98–111)
Creatinine, Ser: 1.23 mg/dL — ABNORMAL HIGH (ref 0.44–1.00)
GFR calc Af Amer: >60 mL/min (ref >60)
GFR calc Af Amer: 54 mL/min — ABNORMAL LOW (ref >60)
GFR calc non Af Amer: 57 mL/min — ABNORMAL LOW (ref >60)
GFR calc non Af Amer: 47 mL/min — ABNORMAL LOW (ref >60)
GLUCOSE: 247 mg/dL — AB (ref 70–99)
Glucose, Bld: 323 mg/dL — ABNORMAL HIGH (ref 70–99)
Potassium: 5.3 mmol/L — ABNORMAL HIGH (ref 3.5–5.1)
Potassium: 5.1 mmol/L (ref 3.5–5.1)
Sodium: 141 mmol/L (ref 135–145)
Sodium: 137 mmol/L (ref 135–145)

## 2018-07-25 LAB — GLUCOSE, CAPILLARY
Glucose-Capillary: 290 mg/dL — ABNORMAL HIGH (ref 70–99)
Glucose-Capillary: 144 mg/dL — ABNORMAL HIGH (ref 70–99)
Glucose-Capillary: 413 mg/dL — ABNORMAL HIGH (ref 70–99)
Glucose-Capillary: 140 mg/dL — ABNORMAL HIGH (ref 70–99)

## 2018-07-25 LAB — GLUCOSE, RANDOM: Glucose, Bld: 117 mg/dL — ABNORMAL HIGH (ref 70–99)

## 2018-07-25 MED ORDER — METOPROLOL TARTRATE 5 MG/5ML IV SOLN
5.0000 mg | Freq: Once | INTRAVENOUS | Status: AC
  Administered 2018-07-25: 5 mg via INTRAVENOUS

## 2018-07-25 MED ORDER — SODIUM CHLORIDE 0.9 % IV SOLN: INTRAVENOUS | Status: DC

## 2018-07-25 MED ORDER — SODIUM CHLORIDE 0.9% FLUSH: 3.0000 mL | INTRAVENOUS | Status: DC | PRN

## 2018-07-25 MED ORDER — ASPIRIN 81 MG PO CHEW
81.0000 mg | CHEWABLE_TABLET | ORAL | Status: AC
  Administered 2018-07-26: 81 mg via ORAL
  Filled 2018-07-25: qty 1

## 2018-07-25 MED ORDER — ASPIRIN EC 81 MG PO TBEC
81.0000 mg | DELAYED_RELEASE_TABLET | Freq: Every day | ORAL | Status: DC
  Filled 2018-07-25: qty 1

## 2018-07-25 MED ORDER — METOPROLOL TARTRATE 5 MG/5ML IV SOLN
INTRAVENOUS | Status: AC
  Administered 2018-07-25: 5 mg via INTRAVENOUS
  Filled 2018-07-25: qty 5

## 2018-07-25 MED ORDER — EZETIMIBE 10 MG PO TABS
10.0000 mg | ORAL_TABLET | Freq: Every day | ORAL | Status: DC
  Administered 2018-07-25 – 2018-07-26 (×2): 10 mg via ORAL
  Filled 2018-07-25 (×4): qty 1

## 2018-07-25 MED ORDER — FUROSEMIDE 10 MG/ML IJ SOLN
40.0000 mg | Freq: Once | INTRAMUSCULAR | Status: AC
  Administered 2018-07-25: 40 mg via INTRAVENOUS
  Filled 2018-07-25: qty 4

## 2018-07-25 MED ORDER — METOPROLOL TARTRATE 25 MG PO TABS
25.0000 mg | ORAL_TABLET | Freq: Two times a day (BID) | ORAL | Status: DC
  Administered 2018-07-25 – 2018-07-27 (×3): 25 mg via ORAL
  Filled 2018-07-25 (×4): qty 1

## 2018-07-25 MED ORDER — SODIUM CHLORIDE 0.9 % IV SOLN: 250.0000 mL | INTRAVENOUS | Status: DC | PRN

## 2018-07-25 MED ORDER — FUROSEMIDE 10 MG/ML IJ SOLN: 40.0000 mg | INTRAMUSCULAR | Status: DC

## 2018-07-25 MED ORDER — SODIUM CHLORIDE 0.9% FLUSH
3.0000 mL | Freq: Two times a day (BID) | INTRAVENOUS | Status: DC
  Administered 2018-07-25: 3 mL via INTRAVENOUS

## 2018-07-25 MED ORDER — INSULIN GLARGINE 100 UNIT/ML ~~LOC~~ SOLN
7.0000 [IU] | Freq: Every day | SUBCUTANEOUS | Status: DC
  Filled 2018-07-25: qty 0.07

## 2018-07-25 NOTE — Progress Notes (Addendum)
Family Medicine Teaching Service Daily Progress Note Intern Pager: [phone]  Patient name: CHONDRA BOYDE Medical record number: [id number] Date of birth: Nov 13, 1955 Age: 63 y.o. Gender: female  Primary Care Provider: Satanta Bing, DO Consultants: Cardiology Code Status: Full   Pt Overview and Major Events to Date:  2/7- New diagnosis of HFrEF  Assessment and Plan: ALEEN MARSTON is a 63 y.o. female with a past medical history significant for T2DM, HTN, fibromyalgia, depression who presented with dyspnea with a new diagnosis of HFrEF based on Echo.  #Acute respiratory failure, new diagnosis of HFrEF Likely in setting of volume overload with initial BNP 985 and ECHO with new 45-50% EF.  Patient satting 95% on RA this morning and appears comfortable. No home O2 requirement.  CTA was negative for PE but did show significant cardiac enlargement with bilateral pleural effusions, basilar atelectasis, and perihilar infiltration likely due to edema.  Patient diuresed well 2/8 with 2.8L out following IV Lasix 40 mg x2.  Weight 130.7>128.3 lbs in last 24h.  Lung exam is clear and no evidence of LE edema bilaterally.  --f/u cardiology, appreciate recs:  L heart cath Monday 2/10, followed by tentative TEE Tuesday, continue carvedilol 3.125 mg BID, plan to restart candesartan after cath -F/u on TEE --continue IV Lasix 40 mg BID  --Continue strict intake and output and daily weights --O2 as needed to maintain Sat >88%  -wean to room air as tolerated   - ambulate with PT  Tachycardia  Overnight with HR intermittently in 130-140 bpm sustained per RN.  Patient asymptomatic and feeling well, resolves spontaneously.  Upon recurrence, IV lopressor 5 mg x1 given with improvement in rate to 85.  -Continue to monitor   #Hypokalemia, improving AM labs not available yet. On admission 3.4>4.3. Mg 1.9 and phos 3.1  --Follow up on am BMP   #T2DM  Last A1c 02/2018 was 8.6. Home meds include sliding  scale insulin, metformin, tresiba 26u daily. Endorses lack of adherence to meds at home over last 2 weeks due to dyspnea.  A1c 10.6 in May 2019.  --Continue Lantus 13u, titrate up as needed -sSSI   #Bipolar Disorder   Well controlled on home meds --Continue Remeron 15 mg daily   #HTN BP this am 123/75. Home meds include metoprolol 50 mg q24, candesartan 4 mg daily --Currently on carvedilol 3.125 mg BID --Continue to hold candesartan in setting of diuresis, plan to restart after cath procedure  #Fibromyalgia  Gabapentin 100 mg QID on med list. Restarted as PRN.  FEN/GI: Dysphagia 2 diet, Ensure feeding supplement TID  Prophylaxis: lovenox   Disposition: Pending cardiac workup   Subjective:  Patient reports feeling overall better, denies CP, SOB, leg swelling, headache, dizziness.  She states overnight she sometimes feels flutter in her heart when waking up.  Feels she is breathing more comfortably but still unable to lay flat.   Objective: Temp:  [98.1 F (36.7 C)-98.7 F (37.1 C)] 98.1 F (36.7 C) (02/09 0436) Pulse Rate:  [85-107] 85 (02/09 0436) Resp:  [18-20] 18 (02/09 0436) BP: (123-149)/(71-87) 123/75 (02/09 0436) SpO2:  [97 %-99 %] 99 % (02/09 0436) Weight:  [58.2 kg] 58.2 kg (02/09 0436)   Physical Exam: General:  Pleasant 63 year old female, NAD Eyes: EOMI, PERRLA Neck: full ROM, no JVD Cardiovascular: RRR, no m/r/g Respiratory: CTA B, no wheeze or crackles, comfortable work of breathing and able to speak in full sentences  Gastrointestinal: soft, nt, nd, +bs MSK: moves 4 extremities equally,  no LE edema Derm: no rashes or lesions  Laboratory: Recent Labs  Lab 07/22/18 2017 07/23/18 1122 07/24/18 0655  WBC 6.0 5.8 6.7  HGB 11.8* 12.3 12.6  HCT 36.9 37.7 39.8  PLT 284 297 281   Recent Labs  Lab 07/22/18 2017 07/23/18 1122 07/24/18 0655  NA 138 140 141  K 3.4* 3.8 4.3  CL 104 99 102  CO2 26 31 30   BUN 10 6* 12  CREATININE 0.73 0.80 0.96   CALCIUM 8.6* 8.8* 9.3  PROT 6.4*  --   --   BILITOT 0.7  --   --   ALKPHOS 131*  --   --   ALT 38  --   --   AST 49*  --   --   GLUCOSE 254* 332* 145*   Trop: 0.03 (x4)  EKG with ST changes turn out to be LBBB discussed with Cardiology on admission   Imaging/Diagnostic Tests:  Dg Chest 2 View Borderline heart size with mild pulmonary vascular congestion and interstitial edema. Small bilateral pleural effusions. Electronically Signed   By: Lucienne Capers M.D.   On: 07/22/2018 19:41   Ct Angio Chest Pe W/cm &/or Wo Cm No evidence of significant pulmonary embolus. 2. Cardiac enlargement with bilateral pleural effusions, basilar atelectasis, and perihilar infiltration, likely due to edema.   Lovenia Kim, MD  PGY-3, [place]

## 2018-07-25 NOTE — Progress Notes (Addendum)
Pt HR noted to be elevated to 130's-140's and sustaining; Pt asleep, awakened but denies any sx. VSS (see flowsheet) but on call provider notified at this time. On call provider states to observe HR, as it began to trend down during phone conversation, but to call back if elevated HR persists.  0150: pt HR continues to be elevated in 130's-140's, on call provider paged again, OTO for 5mg  IV lopressor obtained & administered. Results were immediate. HR down to 110's-120's.  0300: pt sleeping, HR remains at a fixed 90bpm, SR/BBB. No acute issues noted at this time.

## 2018-07-25 NOTE — Progress Notes (Signed)
Assumed care from previous Rn.

## 2018-07-25 NOTE — H&P (View-Only) (Signed)
Progress Note  Patient Name: [name] Date of Encounter: 07/25/2018  Primary Cardiologist: No primary care provider on file.   Subjective   Feeling much better today with less shortness of breath although she did have an episode of chest pain last night  Inpatient Medications    Scheduled Meds: . aspirin EC  81 mg Oral Daily  . atorvastatin  80 mg Oral q morning - 10a  . carvedilol  3.125 mg Oral BID WC  . enoxaparin (LOVENOX) injection  40 mg Subcutaneous Q24H  . ezetimibe  10 mg Oral Daily  . feeding supplement (ENSURE ENLIVE)  237 mL Oral TID BM  . insulin aspart  0-5 Units Subcutaneous QHS  . insulin aspart  0-9 Units Subcutaneous TID WC  . insulin glargine  13 Units Subcutaneous Daily  . mirtazapine  15 mg Oral QHS  . multivitamin with minerals  1 tablet Oral Daily   Continuous Infusions:  PRN Meds: gabapentin, ipratropium-albuterol   Vital Signs    Vitals:   07/24/18 1919 07/25/18 0056 07/25/18 0436 07/25/18 1207  BP: (!) 141/83 (!) 149/87 123/75 109/79  Pulse: 91 (!) 107 85 98  Resp: 18 18 18    Temp: 98.5 F (36.9 C)  98.1 F (36.7 C) 97.9 F (36.6 C)  TempSrc: Oral  Oral Oral  SpO2: 99% 97% 99% 98%  Weight:   58.2 kg   Height:        Intake/Output Summary (Last 24 hours) at 07/25/2018 1333 Last data filed at 07/24/2018 2118 Gross per 24 hour  Intake 240 ml  Output 2125 ml  Net -1885 ml   Filed Weights   07/23/18 0609 07/24/18 0440 07/25/18 0436  Weight: 59.5 kg 59.3 kg 58.2 kg    Telemetry    Normal sinus rhythm with several episodes of SVT that appear to be nonsustained atrial tachycardia- Personally Reviewed  ECG    No new EKG to review- Personally Reviewed  Physical Exam   GEN: No acute distress.   Neck: No JVD Cardiac: RRR, no murmurs, rubs, or gallops.  Respiratory: Clear to auscultation bilaterally. GI: Soft, nontender, non-distended  MS: No edema; No deformity. Neuro:  Nonfocal  Psych: Normal affect   Labs      Chemistry Recent Labs  Lab 07/22/18 2017 07/23/18 1122 07/24/18 0655 07/25/18 0937  NA 138 140 141 141  K 3.4* 3.8 4.3 5.3*  CL 104 99 102 96*  CO2 26 31 30  33*  GLUCOSE 254* 332* 145* 247*  BUN 10 6* 12 16  CREATININE 0.73 0.80 0.96 1.04*  CALCIUM 8.6* 8.8* 9.3 10.3  PROT 6.4*  --   --   --   ALBUMIN 3.4*  --   --   --   AST 49*  --   --   --   ALT 38  --   --   --   ALKPHOS 131*  --   --   --   BILITOT 0.7  --   --   --   GFRNONAA >60 >60 >60 57*  GFRAA >60 >60 >60 >60  ANIONGAP 8 10 9 12      Hematology Recent Labs  Lab 07/22/18 2017 07/23/18 1122 07/24/18 0655  WBC 6.0 5.8 6.7  RBC 3.87 4.03 4.21  HGB 11.8* 12.3 12.6  HCT 36.9 37.7 39.8  MCV 95.3 93.5 94.5  MCH 30.5 30.5 29.9  MCHC 32.0 32.6 31.7  RDW 14.5 14.0 14.1  PLT 284 297 281  Cardiac Enzymes Recent Labs  Lab 07/23/18 0958 07/23/18 1828 07/23/18 2359 07/24/18 0655  TROPONINI <0.03 <0.03 <0.03 <0.03   No results for input(s): TROPIPOC in the last 168 hours.   BNP Recent Labs  Lab 07/22/18 2004  BNP 985.8*     DDimer  Recent Labs  Lab 07/22/18 2017  DDIMER 2.87*     Radiology    No results found.  Cardiac Studies   2D echo 07/23/2018  IMPRESSIONS   1. The left ventricle has mildly reduced systolic function of 19-41%. The cavity size was normal. There is mildly increased left ventricular wall thickness. Echo evidence of pseudonormalization in diastolic relaxation.  2. The right ventricle has normal systolic function. The cavity was normal. There is no increase in right ventricular wall thickness.  3. Left atrial size was severely dilated.  4. Trivial pericardial effusion.  5. The anterior mitral valve leaflet appears partially restricted. There is mild thickening. Mitral valve regurgitation is moderate by color flow Doppler.  6. PISA measurements are consistent with mild MR. However, visually it is at least moderate. There appears to be mild restriction of the anterior mitral  valve leaflet. This can be seen with ischemia or degenerative changes. Would consider cardiology  consultation and consider ischemia evaluation as well as TEE to better evaluate the mitral valve.  7. The tricuspid valve is normal in structure. Tricuspid valve regurgitation is moderate.  8. The aortic valve is normal in structure. There is mild thickening and mild calcification of the aortic valve. Aortic valve regurgitation is mild to moderate by color flow Doppler.  9. The pulmonic valve was normal in structure.  Patient Profile     63 y.o. female with a hx of DM, dyslipidemia, HTN and GERD  who is being seen today for the evaluation of CHF   Assessment & Plan    1.  Acute combined systolic/diastolic CHF -Compared to prior 2D echo EF is now mildly decreased at 45 to 50% with grade 2 diastolic dysfunction.  Echo also showed evidence of moderate mitral regurgitation with tethering of the anterior mitral valve leaflet suggesting a possible ischemic etiology.  She also had moderate TR and mild to moderate AR. -started on IV Lasix and put out 2.2L yesterday and is net 1.18 L -Weight was 148 pounds on admission and now down to 128 pounds.  She has lost 20 pounds since admission -Creatinine stable at 1.04.  Creatinine was 0.73 on admission -We will give 1 more dose of 40 mg Lasix IV today -Continue to follow strict I's and O's and daily weights as well as renal function -Need to rule out ischemic etiology especially given the fact that she has findings on echo of possible anterior mitral valve leaflet tethering which could indicate an ischemic origin for her MR. -Continue beta-blocker and restart ARB after cath  2.  Chest pain -Troponins are negative x4 -Has multiple cardiac risk factors including diabetes mellitus type 2, hypertension, hyperlipidemia and going tobacco abuse.  She also has a strong family history of heart disease -mother, father and sister had an MI. -Given reduced LV function on  echo which is a new finding as well as possible ischemic MR based on echo would recommend proceeding with right and left heart catheterization on Monday to define coronary anatomy and assess filling pressures. -Continue beta-blocker, high-dose statin.aspirin 81 mg daily  3.  Hyperlipidemia -LDL was 100 -LDL goal is less than 70 in the setting of diabetes -Added Zetia 10 mg  daily and will need repeat FLP in 6 weeks  4.  Moderate mitral regurgitation -Pisa measurement MR is considered mild but visually appears at least moderate with some tethering of the anterior mitral valve leaflet which raises the possibility of ischemic MR. -May need to consider TEE after cardiac cath is completed we will plan tentatively for Tuesday  5.  Mild to moderate aortic insufficiency -Need to follow with serial echo  6.  Hypertension -BP P stable at 109/79 mmHg today -Continue beta-blocker -She was on candesartan at home and will recommend restarting this after cath  7.  Type 2 diabetes mellitus -Poorly controlled with hemoglobin A1c 10.6 this admission -TRH  8.  SVT -Review of telemetry this appears to be nonsustained atrial tachycardia -I do not think there is any atrial fibrillation or flutter but will consider outpatient event monitor -Change carvedilol to Lopressor 25 mg twice daily    I have spent a total of 35 minutes with patient reviewing 2D echo , telemetry, EKGs, labs and examining patient as well as establishing an assessment and plan that was discussed with the patient.  > 50% of time was spent in direct patient care.    For questions or updates, please contact [place] Please consult [email or web address] for contact info under Cardiology/STEMI.      Signed, Fransico Him, MD  07/25/2018, 1:33 PM

## 2018-07-25 NOTE — Progress Notes (Signed)
Progress Note  Patient Name: [name] Date of Encounter: 07/25/2018  Primary Cardiologist: No primary care provider on file.   Subjective   Feeling much better today with less shortness of breath although she did have an episode of chest pain last night  Inpatient Medications    Scheduled Meds: . aspirin EC  81 mg Oral Daily  . atorvastatin  80 mg Oral q morning - 10a  . carvedilol  3.125 mg Oral BID WC  . enoxaparin (LOVENOX) injection  40 mg Subcutaneous Q24H  . ezetimibe  10 mg Oral Daily  . feeding supplement (ENSURE ENLIVE)  237 mL Oral TID BM  . insulin aspart  0-5 Units Subcutaneous QHS  . insulin aspart  0-9 Units Subcutaneous TID WC  . insulin glargine  13 Units Subcutaneous Daily  . mirtazapine  15 mg Oral QHS  . multivitamin with minerals  1 tablet Oral Daily   Continuous Infusions:  PRN Meds: gabapentin, ipratropium-albuterol   Vital Signs    Vitals:   07/24/18 1919 07/25/18 0056 07/25/18 0436 07/25/18 1207  BP: (!) 141/83 (!) 149/87 123/75 109/79  Pulse: 91 (!) 107 85 98  Resp: 18 18 18    Temp: 98.5 F (36.9 C)  98.1 F (36.7 C) 97.9 F (36.6 C)  TempSrc: Oral  Oral Oral  SpO2: 99% 97% 99% 98%  Weight:   58.2 kg   Height:        Intake/Output Summary (Last 24 hours) at 07/25/2018 1333 Last data filed at 07/24/2018 2118 Gross per 24 hour  Intake 240 ml  Output 2125 ml  Net -1885 ml   Filed Weights   07/23/18 0609 07/24/18 0440 07/25/18 0436  Weight: 59.5 kg 59.3 kg 58.2 kg    Telemetry    Normal sinus rhythm with several episodes of SVT that appear to be nonsustained atrial tachycardia- Personally Reviewed  ECG    No new EKG to review- Personally Reviewed  Physical Exam   GEN: No acute distress.   Neck: No JVD Cardiac: RRR, no murmurs, rubs, or gallops.  Respiratory: Clear to auscultation bilaterally. GI: Soft, nontender, non-distended  MS: No edema; No deformity. Neuro:  Nonfocal  Psych: Normal affect   Labs      Chemistry Recent Labs  Lab 07/22/18 2017 07/23/18 1122 07/24/18 0655 07/25/18 0937  NA 138 140 141 141  K 3.4* 3.8 4.3 5.3*  CL 104 99 102 96*  CO2 26 31 30  33*  GLUCOSE 254* 332* 145* 247*  BUN 10 6* 12 16  CREATININE 0.73 0.80 0.96 1.04*  CALCIUM 8.6* 8.8* 9.3 10.3  PROT 6.4*  --   --   --   ALBUMIN 3.4*  --   --   --   AST 49*  --   --   --   ALT 38  --   --   --   ALKPHOS 131*  --   --   --   BILITOT 0.7  --   --   --   GFRNONAA >60 >60 >60 57*  GFRAA >60 >60 >60 >60  ANIONGAP 8 10 9 12      Hematology Recent Labs  Lab 07/22/18 2017 07/23/18 1122 07/24/18 0655  WBC 6.0 5.8 6.7  RBC 3.87 4.03 4.21  HGB 11.8* 12.3 12.6  HCT 36.9 37.7 39.8  MCV 95.3 93.5 94.5  MCH 30.5 30.5 29.9  MCHC 32.0 32.6 31.7  RDW 14.5 14.0 14.1  PLT 284 297 281  Cardiac Enzymes Recent Labs  Lab 07/23/18 0958 07/23/18 1828 07/23/18 2359 07/24/18 0655  TROPONINI <0.03 <0.03 <0.03 <0.03   No results for input(s): TROPIPOC in the last 168 hours.   BNP Recent Labs  Lab 07/22/18 2004  BNP 985.8*     DDimer  Recent Labs  Lab 07/22/18 2017  DDIMER 2.87*     Radiology    No results found.  Cardiac Studies   2D echo 07/23/2018  IMPRESSIONS   1. The left ventricle has mildly reduced systolic function of 01-75%. The cavity size was normal. There is mildly increased left ventricular wall thickness. Echo evidence of pseudonormalization in diastolic relaxation.  2. The right ventricle has normal systolic function. The cavity was normal. There is no increase in right ventricular wall thickness.  3. Left atrial size was severely dilated.  4. Trivial pericardial effusion.  5. The anterior mitral valve leaflet appears partially restricted. There is mild thickening. Mitral valve regurgitation is moderate by color flow Doppler.  6. PISA measurements are consistent with mild MR. However, visually it is at least moderate. There appears to be mild restriction of the anterior mitral  valve leaflet. This can be seen with ischemia or degenerative changes. Would consider cardiology  consultation and consider ischemia evaluation as well as TEE to better evaluate the mitral valve.  7. The tricuspid valve is normal in structure. Tricuspid valve regurgitation is moderate.  8. The aortic valve is normal in structure. There is mild thickening and mild calcification of the aortic valve. Aortic valve regurgitation is mild to moderate by color flow Doppler.  9. The pulmonic valve was normal in structure.  Patient Profile     63 y.o. female with a hx of DM, dyslipidemia, HTN and GERD  who is being seen today for the evaluation of CHF   Assessment & Plan    1.  Acute combined systolic/diastolic CHF -Compared to prior 2D echo EF is now mildly decreased at 45 to 50% with grade 2 diastolic dysfunction.  Echo also showed evidence of moderate mitral regurgitation with tethering of the anterior mitral valve leaflet suggesting a possible ischemic etiology.  She also had moderate TR and mild to moderate AR. -started on IV Lasix and put out 2.2L yesterday and is net 1.18 L -Weight was 148 pounds on admission and now down to 128 pounds.  She has lost 20 pounds since admission -Creatinine stable at 1.04.  Creatinine was 0.73 on admission -We will give 1 more dose of 40 mg Lasix IV today -Continue to follow strict I's and O's and daily weights as well as renal function -Need to rule out ischemic etiology especially given the fact that she has findings on echo of possible anterior mitral valve leaflet tethering which could indicate an ischemic origin for her MR. -Continue beta-blocker and restart ARB after cath  2.  Chest pain -Troponins are negative x4 -Has multiple cardiac risk factors including diabetes mellitus type 2, hypertension, hyperlipidemia and going tobacco abuse.  She also has a strong family history of heart disease -mother, father and sister had an MI. -Given reduced LV function on  echo which is a new finding as well as possible ischemic MR based on echo would recommend proceeding with right and left heart catheterization on Monday to define coronary anatomy and assess filling pressures. -Continue beta-blocker, high-dose statin.aspirin 81 mg daily  3.  Hyperlipidemia -LDL was 100 -LDL goal is less than 70 in the setting of diabetes -Added Zetia 10 mg  daily and will need repeat FLP in 6 weeks  4.  Moderate mitral regurgitation -Pisa measurement MR is considered mild but visually appears at least moderate with some tethering of the anterior mitral valve leaflet which raises the possibility of ischemic MR. -May need to consider TEE after cardiac cath is completed we will plan tentatively for Tuesday  5.  Mild to moderate aortic insufficiency -Need to follow with serial echo  6.  Hypertension -BP P stable at 109/79 mmHg today -Continue beta-blocker -She was on candesartan at home and will recommend restarting this after cath  7.  Type 2 diabetes mellitus -Poorly controlled with hemoglobin A1c 10.6 this admission -TRH  8.  SVT -Review of telemetry this appears to be nonsustained atrial tachycardia -I do not think there is any atrial fibrillation or flutter but will consider outpatient event monitor -Change carvedilol to Lopressor 25 mg twice daily    I have spent a total of 35 minutes with patient reviewing 2D echo , telemetry, EKGs, labs and examining patient as well as establishing an assessment and plan that was discussed with the patient.  > 50% of time was spent in direct patient care.    For questions or updates, please contact [place] Please consult [email or web address] for contact info under Cardiology/STEMI.      Signed, Fransico Him, MD  07/25/2018, 1:33 PM

## 2018-07-25 NOTE — Progress Notes (Signed)
Called by nursing for glucose value of 413.  Nurse notes that patient has been receiving sodas and ice cream with her tray.  She is currently ordered dysphagia 2 diet that was not otherwise modified for carb control.  She has also been receiving regular Ensure.  Patient is currently stable.   Modified dysphagia 2 diet with carb control.   Order to draw stat glucose lab, then give 9 units  Reassess with results  Zettie Cooley, M.D. Family Medicine Residency  PGY-1 [p] [phone]  text pages via [place]  07/25/2018, 5:05 PM

## 2018-07-26 ENCOUNTER — Encounter (HOSPITAL_COMMUNITY)
Admission: EM | Disposition: A | Discharge status: Home / Self Care | Payer: Self-pay | Source: Home / Self Care | Attending: Family Medicine

## 2018-07-26 DIAGNOSIS — Z794 Long term (current) use of insulin: Secondary | ICD-10-CM

## 2018-07-26 DIAGNOSIS — I10 Essential (primary) hypertension: Secondary | ICD-10-CM | POA: Diagnosis not present

## 2018-07-26 DIAGNOSIS — E875 Hyperkalemia: Secondary | ICD-10-CM | POA: Diagnosis not present

## 2018-07-26 DIAGNOSIS — I5021 Acute systolic (congestive) heart failure: Secondary | ICD-10-CM | POA: Diagnosis not present

## 2018-07-26 DIAGNOSIS — E119 Type 2 diabetes mellitus without complications: Secondary | ICD-10-CM | POA: Diagnosis not present

## 2018-07-26 DIAGNOSIS — I340 Nonrheumatic mitral (valve) insufficiency: Secondary | ICD-10-CM | POA: Diagnosis not present

## 2018-07-26 DIAGNOSIS — Z720 Tobacco use: Secondary | ICD-10-CM | POA: Diagnosis not present

## 2018-07-26 DIAGNOSIS — I471 Supraventricular tachycardia: Secondary | ICD-10-CM | POA: Diagnosis not present

## 2018-07-26 DIAGNOSIS — E440 Moderate protein-calorie malnutrition: Secondary | ICD-10-CM | POA: Diagnosis not present

## 2018-07-26 DIAGNOSIS — I509 Heart failure, unspecified: Secondary | ICD-10-CM | POA: Diagnosis not present

## 2018-07-26 DIAGNOSIS — F3170 Bipolar disorder, currently in remission, most recent episode unspecified: Secondary | ICD-10-CM

## 2018-07-26 HISTORY — PX: LEFT HEART CATH AND CORONARY ANGIOGRAPHY: CATH118249

## 2018-07-26 LAB — GLUCOSE, CAPILLARY
Glucose-Capillary: 258 mg/dL — ABNORMAL HIGH (ref 70–99)
Glucose-Capillary: 96 mg/dL (ref 70–99)
Glucose-Capillary: 106 mg/dL — ABNORMAL HIGH (ref 70–99)
Glucose-Capillary: 116 mg/dL — ABNORMAL HIGH (ref 70–99)
Glucose-Capillary: 364 mg/dL — ABNORMAL HIGH (ref 70–99)

## 2018-07-26 LAB — POCT I-STAT 7, (LYTES, BLD GAS, ICA,H+H)
ACID-BASE EXCESS: 6 mmol/L — AB (ref 0.0–2.0)
Bicarbonate: 31.5 mmol/L — ABNORMAL HIGH (ref 20.0–28.0)
Calcium, Ion: 1.38 mmol/L (ref 1.15–1.40)
HEMATOCRIT: 39 % (ref 36.0–46.0)
Hemoglobin: 13.3 g/dL (ref 12.0–15.0)
O2 Saturation: 96 %
PH ART: 7.414 (ref 7.350–7.450)
Potassium: 4 mmol/L (ref 3.5–5.1)
Sodium: 139 mmol/L (ref 135–145)
TCO2: 33 mmol/L — ABNORMAL HIGH (ref 22–32)
pCO2 arterial: 49.3 mmHg — ABNORMAL HIGH (ref 32.0–48.0)
pO2, Arterial: 80 mmHg — ABNORMAL LOW (ref 83.0–108.0)

## 2018-07-26 LAB — POCT ACTIVATED CLOTTING TIME: Activated Clotting Time: 114 seconds

## 2018-07-26 LAB — BASIC METABOLIC PANEL
Anion gap: 12 (ref 5–15)
BUN: 24 mg/dL — ABNORMAL HIGH (ref 8–23)
CO2: 31 mmol/L (ref 22–32)
CREATININE: 1.09 mg/dL — AB (ref 0.44–1.00)
Calcium: 10.4 mg/dL — ABNORMAL HIGH (ref 8.9–10.3)
Chloride: 93 mmol/L — ABNORMAL LOW (ref 98–111)
GFR calc Af Amer: >60 mL/min (ref >60)
GFR, EST NON AFRICAN AMERICAN: 54 mL/min — AB (ref >60)
Glucose, Bld: 326 mg/dL — ABNORMAL HIGH (ref 70–99)
Potassium: 4.7 mmol/L (ref 3.5–5.1)
Sodium: 136 mmol/L (ref 135–145)

## 2018-07-26 LAB — POCT I-STAT EG7
Acid-Base Excess: 6 mmol/L — ABNORMAL HIGH (ref 0.0–2.0)
Bicarbonate: 32.3 mmol/L — ABNORMAL HIGH (ref 20.0–28.0)
Calcium, Ion: 1.36 mmol/L (ref 1.15–1.40)
HCT: 40 % (ref 36.0–46.0)
Hemoglobin: 13.6 g/dL (ref 12.0–15.0)
O2 Saturation: 67 %
PCO2 VEN: 50.7 mmHg (ref 44.0–60.0)
Potassium: 4 mmol/L (ref 3.5–5.1)
Sodium: 139 mmol/L (ref 135–145)
TCO2: 34 mmol/L — ABNORMAL HIGH (ref 22–32)
pH, Ven: 7.412 (ref 7.250–7.430)
pO2, Ven: 35 mmHg (ref 32.0–45.0)

## 2018-07-26 SURGERY — LEFT HEART CATH AND CORONARY ANGIOGRAPHY
Anesthesia: LOCAL

## 2018-07-26 MED ORDER — SODIUM CHLORIDE 0.9 % IV SOLN: INTRAVENOUS | Status: AC

## 2018-07-26 MED ORDER — IOHEXOL 350 MG/ML SOLN
INTRAVENOUS | Status: DC | PRN
  Administered 2018-07-26: 55 mL via INTRA_ARTERIAL

## 2018-07-26 MED ORDER — MIDAZOLAM HCL 2 MG/2ML IJ SOLN
INTRAMUSCULAR | Status: AC
  Filled 2018-07-26: qty 2

## 2018-07-26 MED ORDER — HEPARIN (PORCINE) IN NACL 1000-0.9 UT/500ML-% IV SOLN
INTRAVENOUS | Status: DC | PRN
  Administered 2018-07-26 (×2): 500 mL

## 2018-07-26 MED ORDER — LIDOCAINE HCL (PF) 1 % IJ SOLN
INTRAMUSCULAR | Status: AC
  Filled 2018-07-26: qty 30

## 2018-07-26 MED ORDER — MIDAZOLAM HCL 2 MG/2ML IJ SOLN
INTRAMUSCULAR | Status: DC | PRN
  Administered 2018-07-26 (×2): 1 mg via INTRAVENOUS

## 2018-07-26 MED ORDER — INSULIN GLARGINE 100 UNIT/ML ~~LOC~~ SOLN
15.0000 [IU] | Freq: Every day | SUBCUTANEOUS | Status: DC
  Administered 2018-07-27: 15 [IU] via SUBCUTANEOUS
  Filled 2018-07-26: qty 0.15

## 2018-07-26 MED ORDER — ACETAMINOPHEN 325 MG PO TABS
650.0000 mg | ORAL_TABLET | ORAL | Status: DC | PRN
  Administered 2018-07-26: 650 mg via ORAL
  Filled 2018-07-26: qty 2

## 2018-07-26 MED ORDER — METOPROLOL TARTRATE 5 MG/5ML IV SOLN
5.0000 mg | Freq: Once | INTRAVENOUS | Status: AC
  Administered 2018-07-26: 5 mg via INTRAVENOUS

## 2018-07-26 MED ORDER — FENTANYL CITRATE (PF) 100 MCG/2ML IJ SOLN
INTRAMUSCULAR | Status: DC | PRN
  Administered 2018-07-26 (×2): 25 ug via INTRAVENOUS

## 2018-07-26 MED ORDER — HEPARIN SODIUM (PORCINE) 1000 UNIT/ML IJ SOLN
INTRAMUSCULAR | Status: AC
  Filled 2018-07-26: qty 1

## 2018-07-26 MED ORDER — HEPARIN (PORCINE) IN NACL 1000-0.9 UT/500ML-% IV SOLN
INTRAVENOUS | Status: AC
  Filled 2018-07-26: qty 1000

## 2018-07-26 MED ORDER — SODIUM CHLORIDE 0.9% FLUSH
3.0000 mL | Freq: Two times a day (BID) | INTRAVENOUS | Status: DC
  Administered 2018-07-26 – 2018-07-27 (×2): 3 mL via INTRAVENOUS

## 2018-07-26 MED ORDER — FUROSEMIDE 10 MG/ML IJ SOLN
40.0000 mg | Freq: Two times a day (BID) | INTRAMUSCULAR | Status: DC
  Filled 2018-07-26: qty 4

## 2018-07-26 MED ORDER — FENTANYL CITRATE (PF) 100 MCG/2ML IJ SOLN
INTRAMUSCULAR | Status: AC
  Filled 2018-07-26: qty 2

## 2018-07-26 MED ORDER — SODIUM CHLORIDE 0.9 % IV SOLN: 250.0000 mL | INTRAVENOUS | Status: DC | PRN

## 2018-07-26 MED ORDER — LIDOCAINE HCL (PF) 1 % IJ SOLN
INTRAMUSCULAR | Status: DC | PRN
  Administered 2018-07-26: 2 mL
  Administered 2018-07-26: 20 mL

## 2018-07-26 MED ORDER — ONDANSETRON HCL 4 MG/2ML IJ SOLN: 4.0000 mg | Freq: Four times a day (QID) | INTRAMUSCULAR | Status: DC | PRN

## 2018-07-26 MED ORDER — VERAPAMIL HCL 2.5 MG/ML IV SOLN
INTRAVENOUS | Status: AC
  Filled 2018-07-26: qty 2

## 2018-07-26 MED ORDER — METOPROLOL TARTRATE 5 MG/5ML IV SOLN
INTRAVENOUS | Status: AC
  Administered 2018-07-26: 5 mg via INTRAVENOUS
  Filled 2018-07-26: qty 5

## 2018-07-26 MED ORDER — SODIUM CHLORIDE 0.9% FLUSH: 3.0000 mL | INTRAVENOUS | Status: DC | PRN

## 2018-07-26 SURGICAL SUPPLY — 16 items
CATH BALLN WEDGE 5F 110CM (CATHETERS) ×1 IMPLANT
CATH INFINITI 5FR MULTPACK ANG (CATHETERS) ×1 IMPLANT
CATH SWAN GANZ 7F STRAIGHT (CATHETERS) ×1 IMPLANT
DEVICE RAD COMP TR BAND LRG (VASCULAR PRODUCTS) IMPLANT
GLIDESHEATH SLEND SS 6F .021 (SHEATH) ×1 IMPLANT
GUIDEWIRE .025 260CM (WIRE) ×1 IMPLANT
GUIDEWIRE INQWIRE 1.5J.035X260 (WIRE) IMPLANT
INQWIRE 1.5J .035X260CM (WIRE) ×2
KIT HEART LEFT (KITS) ×2 IMPLANT
PACK CARDIAC CATHETERIZATION (CUSTOM PROCEDURE TRAY) ×2 IMPLANT
SHEATH GLIDE SLENDER 4/5FR (SHEATH) ×1 IMPLANT
SHEATH PINNACLE 5F 10CM (SHEATH) ×1 IMPLANT
SHEATH PINNACLE 7F 10CM (SHEATH) ×1 IMPLANT
TRANSDUCER W/STOPCOCK (MISCELLANEOUS) ×2 IMPLANT
TUBING CIL FLEX 10 FLL-RA (TUBING) ×2 IMPLANT
WIRE EMERALD 3MM-J .035X150CM (WIRE) ×1 IMPLANT

## 2018-07-26 NOTE — Progress Notes (Signed)
Site area: rt groin fa and fv sheaths Site Prior to Removal:  Level 0 Pressure Applied For: 20 minutes Manual:   yes Patient Status During Pull:  stable Post Pull Site:  Level  0 Post Pull Instructions Given:  yes Post Pull Pulses Present: rt dp palpable Dressing Applied:  Gauze and tegaderm Bedrest begins @ 2003 Comments:

## 2018-07-26 NOTE — Progress Notes (Signed)
Patients HR is on the 150's,pt asymptomatic.MD paged [phone]. Awaiting to call back.

## 2018-07-26 NOTE — Progress Notes (Signed)
Family Medicine Teaching Service Daily Progress Note Intern Pager: [phone]  Patient name: [name] Medical record number: [id number] Date of birth: 05-18-1956 Age: 63 y.o. Gender: female  Primary Care Provider: [place], DO Consultants: cardiology Code Status: Full code  Pt Overview and Major Events to Date:  Hospital Day 1 Admitted: 07/22/2018  Assessment and Plan: Elner A Carteris a 63 y.o.female with a past medical history significant for T2DM, HTN, fibromyalgia, depression who presented with dyspnea with a new diagnosis of HFrEF based on Echo.  #new onset HFrEF Respiratory failure resolved. CTA was negative for PE but did show significant cardiac enlargement with bilateral pleural effusions, basilar atelectasis, and perihilar infiltration likely due to edema. 300 ml UOP on 2/9, 800 ml so far this am. Weight 67.1>>56.2kg  received one dose lasix on 2/9  -f/u cardiology, appreciate recs:  L heart cath Monday 2/10, followed by tentative TEE Tuesday, metoprolol tartrate 25mg  BID, plan to restart candesartan after cath -F/u on TEE --Continue strict intake and output and daily weights --O2 as needed to maintain Sat >88%  - ambulate with PT  Tachycardia- resolved Intermittent increases to the 140s.  None in past 24 hrs. Patient asymptomatic and feeling well, resolves spontaneously.  Upon recurrence, IV lopressor 5 mg x1 given with improvement in rate to 85.  -Continue to monitor   #Hypokalemia, resolved 5.1 on yesterday afternoon bmp.   #T2DM  Last A1c 02/2018 was 8.6. Home medsincludesliding scale insulin, metformin, tresiba 26u daily. Endorses lack of adherence to meds at home over last 2 weeks due to dyspnea.  A1c10.6 in May 2019.  pateint used 15 U aspart yestderday.  CBG ranged from 117-413.  --increase lantus to 15 U.  -sSSI   #Bipolar Disorder Well controlled on home meds --Continue Remeron 15 mg daily   #HTN BP this am 100/85. Home meds  includemetoprolol 50 mg q24, candesartan 4 mg daily --switched to lopressor 25mg  BID --Continue to hold candesartan in setting of diuresis, plan to restart after cath procedure  #Fibromyalgia Gabapentin 100 mg QID on med list. Restarted as PRN.  FEN/GI:Dysphagia 2 diet, Ensure feeding supplement TID  Prophylaxis:lovenox  Disposition: home   Medications: Scheduled Meds: . [START ON 07/27/2018] aspirin EC  81 mg Oral Daily  . atorvastatin  80 mg Oral q morning - 10a  . enoxaparin (LOVENOX) injection  40 mg Subcutaneous Q24H  . ezetimibe  10 mg Oral Daily  . feeding supplement (ENSURE ENLIVE)  237 mL Oral TID BM  . insulin aspart  0-5 Units Subcutaneous QHS  . insulin aspart  0-9 Units Subcutaneous TID WC  . insulin glargine  7 Units Subcutaneous Daily  . metoprolol tartrate  25 mg Oral BID  . mirtazapine  15 mg Oral QHS  . multivitamin with minerals  1 tablet Oral Daily  . sodium chloride flush  3 mL Intravenous Q12H   Continuous Infusions: . sodium chloride    . sodium chloride     PRN Meds: sodium chloride, gabapentin, ipratropium-albuterol, sodium chloride flush  ================================================= ================================================= Subjective:  Patient states she is feeling better than yesterday.  She walked up and down the hallas yesterday.  She puts on the nasal cannula if she feels she needs it iotherwise she is on room air.   Objective: Temp:  [97.9 F (36.6 C)-98.4 F (36.9 C)] 98.2 F (36.8 C) (02/10 0551) Pulse Rate:  [87-98] 87 (02/10 0551) Resp:  [18-20] 18 (02/10 0551) BP: (100-117)/(79-85) 100/85 (02/10 0551) SpO2:  [  95 %-98 %] 95 % (02/10 0551) Weight:  [56.2 kg] 56.2 kg (02/10 0551) Intake/Output 02/09 0701 - 02/10 0700 In: 240 [P.O.:240] Out: 1100 [Urine:1100] Physical Exam:  Gen: NAD, alert, non-toxic, well-nourished, well-appearing, sitting comfortably  HEENT: Normocephaic, atraumatic. Clear conjuctiva, no  scleral icterus and injection.  CV: Regular rate and rhythm.   Radial pulses 2+ bilaterally. No bilateral lower extremity edema. Resp:  Bibasilar end inspiratory crackles heard on exam. Normal work of breathing. On room air.    Abd: Nontender and nondistended on palpation to all 4 quadrants.  Positive bowel sounds. Psych: Cooperative with exam. Pleasant. Makes eye contact. Extremities: Full ROM    Laboratory: Recent Labs  Lab 07/22/18 2017 07/23/18 1122 07/24/18 0655  WBC 6.0 5.8 6.7  HGB 11.8* 12.3 12.6  HCT 36.9 37.7 39.8  PLT 284 297 281   Recent Labs  Lab 07/22/18 2017  07/24/18 0655 07/25/18 0937 07/25/18 1523 07/25/18 1811  NA 138   < > 141 141 137  --   K 3.4*   < > 4.3 5.3* 5.1  --   CL 104   < > 102 96* 92*  --   CO2 26   < > 30 33* 34*  --   BUN 10   < > 12 16 23   --   CREATININE 0.73   < > 0.96 1.04* 1.23*  --   CALCIUM 8.6*   < > 9.3 10.3 10.5*  --   PROT 6.4*  --   --   --   --   --   BILITOT 0.7  --   --   --   --   --   ALKPHOS 131*  --   --   --   --   --   ALT 38  --   --   --   --   --   AST 49*  --   --   --   --   --   GLUCOSE 254*   < > 145* 247* 323* 117*   < > = values in this interval not displayed.  *   Imaging/Diagnostic Tests: No results found.    Benay Pike, MD 07/26/2018, 6:23 AM PGY-1, [place] Intern pager: [phone], text pages welcome

## 2018-07-26 NOTE — Interval H&P Note (Signed)
History and Physical Interval Note:  07/26/2018 2:52 PM  Stanley A Duncanson  has presented today for surgery, with the diagnosis of unstable angina  The various methods of treatment have been discussed with the patient and family. After consideration of risks, benefits and other options for treatment, the patient has consented to  Procedure(s): LEFT HEART CATH AND CORONARY ANGIOGRAPHY (N/A) as a surgical intervention .  The patient's history has been reviewed, patient examined, no change in status, stable for surgery.  I have reviewed the patient's chart and labs.  Questions were answered to the patient's satisfaction.     Sherren Mocha

## 2018-07-26 NOTE — Progress Notes (Signed)
Received patient from [facility].Right brachial and right Femoral site level0, dressing CDI.Patient instructed to limit movement on her right leg.

## 2018-07-26 NOTE — Progress Notes (Signed)
Patient complaining of pain 10/10 on her left lower back. Given Tylenol and heat packs,patient verbalized it helped some. MD paged [phone]) awaiting to cal back.

## 2018-07-26 NOTE — Progress Notes (Signed)
Inpatient Diabetes Program Recommendations  AACE/ADA: New Consensus Statement on Inpatient Glycemic Control (2015)  Target Ranges:  Prepandial:   less than 140 mg/dL      Peak postprandial:   less than 180 mg/dL (1-2 hours)      Critically ill patients:  140 - 180 mg/dL   Lab Results  Component Value Date   GLUCAP 258 (H) 07/26/2018   HGBA1C 10.6 (H) 07/23/2018    Review of Glycemic Control Results for [name], [name] (MRN [id number]) as of 07/26/2018 13:15  Ref. Range 07/25/2018 08:02 07/25/2018 12:07 07/25/2018 16:49 07/25/2018 21:10 07/26/2018 08:05  Glucose-Capillary Latest Ref Range: 70 - 99 mg/dL 290 (H) 144 (H) 413 (H) 140 (H) 258 (H)   Diabetes history: DM 2 Outpatient Diabetes medications:  Metformin 750 mg with breakfast, Novolog 4-8 units tid with meals, Tresiba 26 units daily Current orders for Inpatient glycemic control:  Novolog sensitive tid with meals and HS, Lantus 15 units daily Inpatient Diabetes Program Recommendations:   Consider increasing Lantus further to 18 units daily. Also may benefit from the addition of Novolog meal coverage 2 units tid with meals.    Thanks,  Adah Perl, RN, BC-ADM Inpatient Diabetes Coordinator Pager [phone] (8a-5p)

## 2018-07-26 NOTE — Progress Notes (Signed)
Paged MD [phone] regarding patients HR 150's.

## 2018-07-27 ENCOUNTER — Other Ambulatory Visit: Payer: Self-pay | Admitting: Physician Assistant

## 2018-07-27 ENCOUNTER — Encounter (HOSPITAL_COMMUNITY): Payer: Self-pay | Admitting: Cardiovascular Disease

## 2018-07-27 DIAGNOSIS — I5041 Acute combined systolic (congestive) and diastolic (congestive) heart failure: Secondary | ICD-10-CM | POA: Diagnosis not present

## 2018-07-27 DIAGNOSIS — I5032 Chronic diastolic (congestive) heart failure: Secondary | ICD-10-CM

## 2018-07-27 DIAGNOSIS — I509 Heart failure, unspecified: Secondary | ICD-10-CM | POA: Diagnosis not present

## 2018-07-27 DIAGNOSIS — I5021 Acute systolic (congestive) heart failure: Secondary | ICD-10-CM

## 2018-07-27 DIAGNOSIS — I471 Supraventricular tachycardia: Secondary | ICD-10-CM

## 2018-07-27 DIAGNOSIS — I10 Essential (primary) hypertension: Secondary | ICD-10-CM | POA: Diagnosis not present

## 2018-07-27 DIAGNOSIS — E440 Moderate protein-calorie malnutrition: Secondary | ICD-10-CM | POA: Diagnosis not present

## 2018-07-27 DIAGNOSIS — E119 Type 2 diabetes mellitus without complications: Secondary | ICD-10-CM | POA: Diagnosis not present

## 2018-07-27 DIAGNOSIS — I340 Nonrheumatic mitral (valve) insufficiency: Secondary | ICD-10-CM | POA: Diagnosis not present

## 2018-07-27 DIAGNOSIS — E7800 Pure hypercholesterolemia, unspecified: Secondary | ICD-10-CM | POA: Diagnosis not present

## 2018-07-27 DIAGNOSIS — Z720 Tobacco use: Secondary | ICD-10-CM | POA: Diagnosis not present

## 2018-07-27 DIAGNOSIS — R079 Chest pain, unspecified: Secondary | ICD-10-CM | POA: Diagnosis not present

## 2018-07-27 DIAGNOSIS — E875 Hyperkalemia: Secondary | ICD-10-CM | POA: Diagnosis not present

## 2018-07-27 DIAGNOSIS — I5043 Acute on chronic combined systolic (congestive) and diastolic (congestive) heart failure: Secondary | ICD-10-CM | POA: Diagnosis not present

## 2018-07-27 DIAGNOSIS — Z794 Long term (current) use of insulin: Secondary | ICD-10-CM | POA: Diagnosis not present

## 2018-07-27 LAB — COMPREHENSIVE METABOLIC PANEL
ALT: 19 U/L (ref 0–44)
AST: 19 U/L (ref 15–41)
Albumin: 3.1 g/dL — ABNORMAL LOW (ref 3.5–5.0)
Alkaline Phosphatase: 95 U/L (ref 38–126)
Anion gap: 8 (ref 5–15)
BUN: 27 mg/dL — ABNORMAL HIGH (ref 8–23)
CO2: 30 mmol/L (ref 22–32)
Calcium: 10 mg/dL (ref 8.9–10.3)
Chloride: 100 mmol/L (ref 98–111)
Creatinine, Ser: 1.18 mg/dL — ABNORMAL HIGH (ref 0.44–1.00)
GFR calc Af Amer: 57 mL/min — ABNORMAL LOW (ref >60)
GFR calc non Af Amer: 49 mL/min — ABNORMAL LOW (ref >60)
Glucose, Bld: 221 mg/dL — ABNORMAL HIGH (ref 70–99)
Potassium: 4.9 mmol/L (ref 3.5–5.1)
Sodium: 138 mmol/L (ref 135–145)
Total Bilirubin: 0.7 mg/dL (ref 0.3–1.2)
Total Protein: 6.6 g/dL (ref 6.5–8.1)

## 2018-07-27 LAB — GLUCOSE, CAPILLARY
Glucose-Capillary: 188 mg/dL — ABNORMAL HIGH (ref 70–99)
Glucose-Capillary: 359 mg/dL — ABNORMAL HIGH (ref 70–99)

## 2018-07-27 MED ORDER — ENSURE ENLIVE PO LIQD: 237.0000 mL | Freq: Three times a day (TID) | ORAL | 12 refills | Status: AC

## 2018-07-27 MED ORDER — ATORVASTATIN CALCIUM 80 MG PO TABS: 80.0000 mg | ORAL_TABLET | Freq: Every morning | ORAL | 0 refills | Status: DC

## 2018-07-27 MED ORDER — METOPROLOL TARTRATE 50 MG PO TABS: 50.0000 mg | ORAL_TABLET | Freq: Two times a day (BID) | ORAL | Status: DC

## 2018-07-27 MED ORDER — METOPROLOL SUCCINATE ER 25 MG PO TB24: 75.0000 mg | ORAL_TABLET | Freq: Every day | ORAL | 0 refills | Status: DC

## 2018-07-27 MED ORDER — FUROSEMIDE 20 MG PO TABS: 20.0000 mg | ORAL_TABLET | Freq: Every day | ORAL | 0 refills | Status: DC

## 2018-07-27 MED ORDER — METOPROLOL TARTRATE 25 MG PO TABS: 37.5000 mg | ORAL_TABLET | Freq: Two times a day (BID) | ORAL | Status: DC

## 2018-07-27 MED ORDER — IRBESARTAN 75 MG PO TABS
37.5000 mg | ORAL_TABLET | Freq: Every day | ORAL | Status: DC
  Administered 2018-07-27: 37.5 mg via ORAL
  Filled 2018-07-27: qty 0.5

## 2018-07-27 MED FILL — Heparin Sodium (Porcine) Inj 1000 Unit/ML: INTRAMUSCULAR | Qty: 10 | Status: AC

## 2018-07-27 NOTE — Progress Notes (Signed)
Nutrition Follow-up  DOCUMENTATION CODES:   Non-severe (moderate) malnutrition in context of chronic illness  INTERVENTION:   -Downgrade diet to dysphagia 2 (mechanical soft) for ease of intake, as pt with poor dentition -Continue MVI with minerals daily -Continue Ensure Enlive po TID, each supplement provides 350 kcal and 20 grams of protein  NUTRITION DIAGNOSIS:   Moderate Malnutrition related to chronic illness(CHF) as evidenced by percent weight loss, energy intake < or equal to 75% for > or equal to 1 month, mild fat depletion, moderate fat depletion, mild muscle depletion, moderate muscle depletion.  Ongoing  GOAL:   Patient will meet greater than or equal to 90% of their needs  Progressing  MONITOR:   PO intake, Supplement acceptance, Labs, Weight trends, Skin, I & O's  REASON FOR ASSESSMENT:   Malnutrition Screening Tool    ASSESSMENT:   Patient presenting with a week and a half history of shortness of breath.  For about 5 days had some mild discomfort in lower part of the chest.  But nothing severe or worse today.  Patient states that about 2 weeks ago she did have some increased pain in the epigastric area.  That lasted for a couple days.  Patient states that she has more shortness of breath when she lays down.  She needs to sleep sitting up.  2/10- s/p lt heart cath and coronary angiography  Reviewed I/O's: +123 ml x 24 hours and -4.9 L since admission  Pt was recently NPO for potential TEE, however, has been rescheduled as outpatient.   Spoke with pt in bedside, who reports feeling better and improved spirits since last RD visit. Pt reports appetite has improved, but is mostly consuming starches and vegetables, as she often still cannot tolerate meats due to being too tough. Pt again refused offer of pureed meats.   Pt consuming Ensure supplements- noted she consumed about 75% of supplement this AM. Discussed importance of good meals and supplements to promote  healing. Pt to continue Ensure and soft diet at home.   Labs reviewed: CBGS: 116-364 (inpatient orders for glycemic control are 0-5 units insulin aspart q HS, 0-9 units insulin aspart TID with meals, and 15 units insulin glargine daily).   Diet Order:   Diet Order            DIET DYS 2 Room service appropriate? Yes; Fluid consistency: Thin  Diet effective now              EDUCATION NEEDS:   Education needs have been addressed  Skin:  Skin Assessment: Reviewed RN Assessment  Last BM:  07/24/18  Height:   Ht Readings from Last 1 Encounters:  07/23/18 5\' 2"  (1.575 m)    Weight:   Wt Readings from Last 1 Encounters:  07/27/18 58.5 kg    Ideal Body Weight:  50 kg  BMI:  Body mass index is 23.59 kg/m.  Estimated Nutritional Needs:   Kcal:  1800-2000  Protein:  90-105 grams  Fluid:  1.8-2.0 L    [name] A. Jimmye Norman, RD, LDN, CDE Pager: [phone] After hours Pager: [phone]

## 2018-07-27 NOTE — Progress Notes (Signed)
Progress Note  Patient Name: [name] Date of Encounter: 07/27/2018  Primary Cardiologist: No primary care provider on file.   Subjective   Denies any chest pain and shortness of breath resolved.  Cath yesterday showed minimal luminal irregularities in the RCA and LAD but no obstructive disease with low intracardiac filling pressures and preserved cardiac output.  Plan for TEE today to assess mitral valve.  Inpatient Medications    Scheduled Meds: . aspirin EC  81 mg Oral Daily  . atorvastatin  80 mg Oral q morning - 10a  . enoxaparin (LOVENOX) injection  40 mg Subcutaneous Q24H  . ezetimibe  10 mg Oral Daily  . feeding supplement (ENSURE ENLIVE)  237 mL Oral TID BM  . insulin aspart  0-5 Units Subcutaneous QHS  . insulin aspart  0-9 Units Subcutaneous TID WC  . insulin glargine  15 Units Subcutaneous Daily  . metoprolol tartrate  25 mg Oral BID  . mirtazapine  15 mg Oral QHS  . multivitamin with minerals  1 tablet Oral Daily  . sodium chloride flush  3 mL Intravenous Q12H   Continuous Infusions: . sodium chloride     PRN Meds: sodium chloride, acetaminophen, gabapentin, ipratropium-albuterol, ondansetron (ZOFRAN) IV, sodium chloride flush   Vital Signs    Vitals:   07/26/18 1615 07/26/18 1934 07/27/18 0350 07/27/18 0816  BP: 129/67 109/65 118/62 126/71  Pulse: 93 93 77 84  Resp: 19 18 16 16   Temp:  98.4 F (36.9 C) 97.8 F (36.6 C) 97.8 F (36.6 C)  TempSrc:  Oral Oral Oral  SpO2: 94% 100% 100% 95%  Weight:   58.5 kg   Height:        Intake/Output Summary (Last 24 hours) at 07/27/2018 0957 Last data filed at 07/27/2018 7619 Gross per 24 hour  Intake 0 ml  Output -  Net 0 ml   Filed Weights   07/25/18 0436 07/26/18 0551 07/27/18 0350  Weight: 58.2 kg 56.2 kg 58.5 kg    Telemetry    Normal sinus rhythm - personally Reviewed  ECG    No new EKG to review- Personally Reviewed  Physical Exam   GEN: Well nourished, well developed in no acute  distress HEENT: Normal NECK: No JVD; No carotid bruits LYMPHATICS: No lymphadenopathy CARDIAC:RRR, no murmurs, rubs, gallops RESPIRATORY:  Clear to auscultation without rales, wheezing or rhonchi  ABDOMEN: Soft, non-tender, non-distended MUSCULOSKELETAL:  No edema; No deformity  SKIN: Warm and dry NEUROLOGIC:  Alert and oriented x 3 PSYCHIATRIC:  Normal affect    Labs    Chemistry Recent Labs  Lab 07/22/18 2017  07/25/18 1523 07/25/18 1811 07/26/18 0544 07/26/18 1524 07/26/18 1528 07/27/18 0457  NA 138   < > 137  --  136 139 139 138  K 3.4*   < > 5.1  --  4.7 4.0 4.0 4.9  CL 104   < > 92*  --  93*  --   --  100  CO2 26   < > 34*  --  31  --   --  30  GLUCOSE 254*   < > 323* 117* 326*  --   --  221*  BUN 10   < > 23  --  24*  --   --  27*  CREATININE 0.73   < > 1.23*  --  1.09*  --   --  1.18*  CALCIUM 8.6*   < > 10.5*  --  10.4*  --   --  10.0  PROT 6.4*  --   --   --   --   --   --  6.6  ALBUMIN 3.4*  --   --   --   --   --   --  3.1*  AST 49*  --   --   --   --   --   --  19  ALT 38  --   --   --   --   --   --  19  ALKPHOS 131*  --   --   --   --   --   --  95  BILITOT 0.7  --   --   --   --   --   --  0.7  GFRNONAA >60   < > 47*  --  54*  --   --  49*  GFRAA >60   < > 54*  --  >60  --   --  57*  ANIONGAP 8   < > 11  --  12  --   --  8   < > = values in this interval not displayed.     Hematology Recent Labs  Lab 07/22/18 2017 07/23/18 1122 07/24/18 0655 07/26/18 1524 07/26/18 1528  WBC 6.0 5.8 6.7  --   --   RBC 3.87 4.03 4.21  --   --   HGB 11.8* 12.3 12.6 13.3 13.6  HCT 36.9 37.7 39.8 39.0 40.0  MCV 95.3 93.5 94.5  --   --   MCH 30.5 30.5 29.9  --   --   MCHC 32.0 32.6 31.7  --   --   RDW 14.5 14.0 14.1  --   --   PLT 284 297 281  --   --     Cardiac Enzymes Recent Labs  Lab 07/23/18 0958 07/23/18 1828 07/23/18 2359 07/24/18 0655  TROPONINI <0.03 <0.03 <0.03 <0.03   No results for input(s): TROPIPOC in the last 168 hours.   BNP Recent  Labs  Lab 07/22/18 2004  BNP 985.8*     DDimer  Recent Labs  Lab 07/22/18 2017  DDIMER 2.87*     Radiology    No results found.  Cardiac Studies   2D echo 07/23/2018  IMPRESSIONS   1. The left ventricle has mildly reduced systolic function of 16-10%. The cavity size was normal. There is mildly increased left ventricular wall thickness. Echo evidence of pseudonormalization in diastolic relaxation.  2. The right ventricle has normal systolic function. The cavity was normal. There is no increase in right ventricular wall thickness.  3. Left atrial size was severely dilated.  4. Trivial pericardial effusion.  5. The anterior mitral valve leaflet appears partially restricted. There is mild thickening. Mitral valve regurgitation is moderate by color flow Doppler.  6. PISA measurements are consistent with mild MR. However, visually it is at least moderate. There appears to be mild restriction of the anterior mitral valve leaflet. This can be seen with ischemia or degenerative changes. Would consider cardiology  consultation and consider ischemia evaluation as well as TEE to better evaluate the mitral valve.  7. The tricuspid valve is normal in structure. Tricuspid valve regurgitation is moderate.  8. The aortic valve is normal in structure. There is mild thickening and mild calcification of the aortic valve. Aortic valve regurgitation is mild to moderate by color flow Doppler.  9. The pulmonic valve was normal in structure.  07/26/2018 Cardiac Cath  Conclusion   1. Patent coronary arteries with no significant obstructive disease - mild luminal irregularities noted in the RCA and LAD 2. Low intracardiac filling pressures 3. Preserved cardiac output  Continue medical therapy. Plans noted for TEE to further assess mitral valve. Cath hemodynamics not suggestive of severe MR, but may be function component to MR and patient appears to be well-compensated at present.      Patient  Profile     63 y.o. female with a hx of DM, dyslipidemia, HTN and GERD  who is being seen today for the evaluation of CHF   Assessment & Plan    1.  Acute combined systolic/diastolic CHF -Compared to prior 2D echo EF is now mildly decreased at 45 to 50% with grade 2 diastolic dysfunction.  Echo also showed evidence of moderate mitral regurgitation with tethering of the anterior mitral valve leaflet suggesting a possible ischemic etiology.  She also had moderate TR and mild to moderate AR. -started on IV Lasix and put out 2.2L yesterday and is net 1.18 L -Weight was 148 pounds on admission and now down to 128 pounds yesterday but weight back up to 129 pounds today question accuracy.   -Cath showed low filling pressures with right atrial pressure 2 mmHg, PA mean pressure 13 mmHg and LVEDP 3 mmHg. -Reactant bumped to 1.18 today and was 0.73 on admission -Hold Lasix today and then discharge home on 20 mg Lasix p.o. daily starting tomorrow -Continue beta-blocker  -Restart candesartan 4 mg daily  2.  Chest pain -Troponins are negative x4 -Cardiac cath yesterday showed minimal luminal irregularities with no obstructive CAD visit with noncardiac etiology of chest pain could have been related to volume overload -Continue beta-blocker and high-dose statin  3.  Hyperlipidemia -LDL was 100 -LDL goal is less than 70 in the setting of diabetes -Continue atorvastatin 80 mg daily -Hold off on Zetia for now and repeat FLP and ALT in 6 weeks and if LDL not at goal then can start Zetia.  4.  Moderate mitral regurgitation -Pisa measurement MR is considered mild but visually appears at least moderate with some tethering of the anterior mitral valve leaflet which raises the possibility of ischemic MR. -Plan for TEE to assess mitral valve further - this can be done outpatient as Endo schedule is full  5.  Mild to moderate aortic insufficiency -Need to follow with serial echo  6.  Hypertension -BP  well controlled at 126/71 mmHg -Continue beta-blocker -Restart ARB for diabetes and blood pressure control  7.  Type 2 diabetes mellitus -Poorly controlled with hemoglobin A1c 10.6 this admission -TRH  8.  SVT -Review of telemetry this appears to be nonsustained atrial tachycardia -I do not think there is any atrial fibrillation or flutter but will consider outpatient event monitor -No further SVT since changing to Lopressor -Continue Lopressor 25 mg twice daily -Event monitor outpatient to assess for other atrial arrhythmias such as A. fib   [facility] will sign off.   Medication Recommendations: Lopressor 25 mg twice daily, candesartan 4 mg daily, atorvastatin 80 mg daily Other recommendations (labs, testing, etc):  FLP and ALT in 6 weeks, outpatient event monitor Follow up as an outpatient:  1-2 weeks with extender  For questions or updates, please contact [facility] Please consult [email or web address] for contact info under Cardiology/STEMI.      Signed, Fransico Him, MD  07/27/2018, 9:57 AM

## 2018-07-27 NOTE — Discharge Summary (Signed)
[facility] Discharge Summary  Patient name: [name] Medical record number: [id number] Date of birth: Oct 28, 1955 Age: 63 y.o. Gender: female Date of Admission: 07/22/2018  Date of Discharge: 07/27/2018 Admitting Physician: Etta Quill, DO  Primary Care Provider: Salinas Bing, DO Consultants: cardiology  Indication for Hospitalization: worsening dyspnea.    Discharge Diagnoses/Problem List:  HFrEF   Disposition: home   Discharge Condition: improved, stable  Discharge Exam:  BP 123/68 (BP Location: Right Arm)   Pulse 89   Temp 98.7 F (37.1 C) (Oral)   Resp 16   Ht 5' 2"  (1.575 m)   Wt 58.5 kg   SpO2 99%   BMI 23.59 kg/m  Gen: NAD, alert, non-toxic, well-nourished, well-appearing, sitting comfortably  HEENT: Normocephaic, atraumatic. Clear conjuctiva, no scleral icterus and injection.  CV: Regular rate and rhythm.   Radial pulses 2+ bilaterally. No bilateral lower extremity edema. Resp:  Bibasilar end inspiratory crackles heard on exam, R>L. Improved from yesterday. Normal work of breathing. On room air.    Abd: LLQ tenderness.  Positive bowel sounds. Psych: Cooperative with exam. Pleasant. Makes eye contact. Extremities: Full ROM   Brief Hospital Course:  Patient presented to the ED on 2/6.  BNP was found to be 985, troponin negative. CXR was significant for pulmonary vascular congestion and interstitial edema. Small bilateral pleural effusions. CTA ruled out PE.  Patient was given 1 dose of IV lasix with improvement of respiratory symptoms. Echo showed EF of 45-50% with increased LV wall thickness and grade 2 diastolic dysfunction as well as moderate TR and AR.  Coronary cath showed patent arteries with no significant obstructive disease.  Tachycardia - patient had two episodes of tachycardia during her admission.  They resolved with IV lopressor and an increase in her PO lopressor dose.  Her home metoprolol succinate was increased  to match the increase in her inpatient dose.   DM-II - patient had blood glucose levels in the 300s on admission which continued to fluctation between 100-300 during her stay.  Her lantus was increased from 13 to 15 U.     All of her other chronic stable conditions were managed with her home medications.   Patient was discharged with close follow up in our family medicine clinic.   Issues for Follow Up:  1. zetia - held off on starting in the hospital.  Per cards recs, she should get a repeat lipid panel and LFTs in 6 week and restart if LDL is not at goal.  2. HF - metoprolol succinate was increased to 6m daily 3. Started patient on 273mPO lasix at discharge. Consider BMP to assess electrolyte levels on f/u and in one month.   4. Per cards, patient can get TEE outpatient.  Will need to set up referral for TEE appointment to further assess mitral valve.  5. Diabetes - continued patient's regular diabetes medications at discharge.  Consider adjusting her medications given elevated glucose levels throughout her admission  6. Patient's gabapentin was ordered PRN during admission.  Did not continue it on discharge.  Assess if patient will continue to need this medication.   Significant Procedures:  Cardiac catheterization  Significant Labs and Imaging:  Recent Labs  Lab 07/23/18 1122 07/24/18 0655 07/26/18 1524 07/26/18 1528  WBC 5.8 6.7  --   --   HGB 12.3 12.6 13.3 13.6  HCT 37.7 39.8 39.0 40.0  PLT 297 281  --   --    Recent  Labs  Lab 07/23/18 1122 07/24/18 0655 07/25/18 0937 07/25/18 1523 07/25/18 1811 07/26/18 0544 07/26/18 1524 07/26/18 1528 07/27/18 0457  NA 140 141 141 137  --  136 139 139 138  K 3.8 4.3 5.3* 5.1  --  4.7 4.0 4.0 4.9  CL 99 102 96* 92*  --  93*  --   --  100  CO2 31 30 33* 34*  --  31  --   --  30  GLUCOSE 332* 145* 247* 323* 117* 326*  --   --  221*  BUN 6* 12 16 23   --  24*  --   --  27*  CREATININE 0.80 0.96 1.04* 1.23*  --  1.09*  --   --   1.18*  CALCIUM 8.8* 9.3 10.3 10.5*  --  10.4*  --   --  10.0  MG 1.9  --   --   --   --   --   --   --   --   PHOS 3.1  --   --   --   --   --   --   --   --   ALKPHOS  --   --   --   --   --   --   --   --  95  AST  --   --   --   --   --   --   --   --  19  ALT  --   --   --   --   --   --   --   --  19  ALBUMIN  --   --   --   --   --   --   --   --  3.1*      Results/Tests Pending at Time of Discharge:   Discharge Medications:  Allergies as of 07/27/2018      Reactions   Ibuprofen Other (See Comments)   REACTION: chest tightness and feeling like something stuck in chest   Lisinopril Cough   Dry hacking cough      Medication List    STOP taking these medications   gabapentin 100 MG capsule Commonly known as:  NEURONTIN   nicotine polacrilex 2 MG gum Commonly known as:  NICORETTE     TAKE these medications   acetaminophen 500 MG tablet Commonly known as:  TYLENOL Take 1,000 mg by mouth every 6 (six) hours as needed for mild pain.   atorvastatin 80 MG tablet Commonly known as:  LIPITOR Take 1 tablet (80 mg total) by mouth every morning. What changed:    medication strength  how much to take  when to take this   candesartan 4 MG tablet Commonly known as:  ATACAND Take 1 tablet (4 mg total) by mouth daily.   feeding supplement (ENSURE ENLIVE) Liqd Take 237 mLs by mouth 3 (three) times daily between meals.   furosemide 20 MG tablet Commonly known as:  LASIX Take 1 tablet (20 mg total) by mouth daily for 30 days.   glucose blood test strip Commonly known as:  ONE TOUCH ULTRA TEST Test 3 times a day.   insulin aspart 100 UNIT/ML injection Commonly known as:  NOVOLOG Inject 4-8 Units into the skin 2 (two) times daily with a meal.   insulin degludec 100 UNIT/ML Sopn FlexTouch Pen Commonly known as:  TRESIBA FLEXTOUCH Inject 0.26 mLs (26 Units total) into the skin daily.  metFORMIN 750 MG 24 hr tablet Commonly known as:  GLUCOPHAGE-XR Take 1 tablet  (750 mg total) by mouth daily with breakfast.   metoprolol succinate 25 MG 24 hr tablet Commonly known as:  TOPROL-XL Take 3 tablets (75 mg total) by mouth daily. TAKE ONE TABLET BY MOUTH ONCE DAILY WITH  OR  IMMEDIATELY  FOLLOWING  A  MEAL What changed:    medication strength  how much to take  how to take this  when to take this   mirtazapine 15 MG tablet Commonly known as:  REMERON Take 1 tablet (15 mg total) by mouth at bedtime. What changed:    when to take this  reasons to take this   nicotine 14 mg/24hr patch Commonly known as:  NICODERM CQ - dosed in mg/24 hours Place 1 patch (14 mg total) onto the skin daily.   ONE TOUCH LANCETS Misc 1 Device by Does not apply route 3 (three) times daily.   onetouch ultrasoft lancets Use as instructed   ONE TOUCH ULTRA 2 w/Device Kit 1 kit by Does not apply route once.   ranitidine 150 MG tablet Commonly known as:  ZANTAC Take 2 tablets (300 mg total) by mouth 2 (two) times daily. What changed:  when to take this       Discharge Instructions: Please refer to Patient Instructions section of EMR for full details.  Patient was counseled important signs and symptoms that should prompt return to medical care, changes in medications, dietary instructions, activity restrictions, and follow up appointments.   Follow-Up Appointments: Follow-up Information    Thornton Bing, DO Follow up on 08/11/2018.   Specialty:  Family Medicine Why:  we have scheduled an appointment with Dr. Yisroel Ramming your PCP on this date.   Contact information: [place] [address] [phone]        Nuala Alpha, DO Follow up on 07/30/2018.   Specialty:  Family Medicine Why:  You have a hospital followup with Dr. Garlan Fillers on this date.  Dr. Yisroel Ramming was not available this week and we would like you to come in earlier than the 26th.   Contact information: [address] [address] [phone]        Charlie Pitter, PA-C Follow up on 08/03/2018.   Specialties:  Cardiology, Radiology Why:  9:00AM for [facility] Contact information: [address] [place] [address] [phone]           Benay Pike, MD 07/30/2018, 7:40 AM PGY-1, [place]

## 2018-07-27 NOTE — Discharge Instructions (Addendum)
For your heart failure you will need to take metoprolol to control your heart rate and lasix to keep fluid from building up in your lungs. We increased the metoprolol to 75mg  daily.  You will need close follow up with your doctors which is why we have given you an appointment for Friday at the family medicine clinic as well as another appointment in two weeks with Dr. Yisroel Ramming.  You will also need follow up with the cardiologist.     [facility] Stay Proper nutrition can help your body recover from illness and injury.   Foods and beverages high in protein, vitamins, and minerals help rebuild muscle loss, promote healing, & reduce fall risk.   In addition to eating healthy foods, a nutrition shake is an easy, delicious way to get the nutrition you need during and after your hospital stay  It is recommended that you continue to drink 2 bottles per day of:       Ensure Plus for at least 1 month (30 days) after your hospital stay   Tips for adding a nutrition shake into your routine: As allowed, drink one with vitamins or medications instead of water or juice Enjoy one as a tasty mid-morning or afternoon snack Drink cold or make a milkshake out of it Drink one instead of milk with cereal or snacks Use as a coffee creamer   Available at the following grocery stores and pharmacies:           * [place] [phone]            For COUPONS visit: www.ensure.com/join or http://dawson-may.com/   Suggested Substitutions Ensure Plus = Boost Plus = Carnation Breakfast Essentials = Boost Compact Ensure Active Clear = Boost Breeze Glucerna Shake = Boost Glucose Control = Carnation Breakfast Essentials SUGAR FREE    DASH Eating Plan DASH stands for "Dietary Approaches to Stop Hypertension." The DASH eating plan is a  healthy eating plan that has been shown to reduce high blood pressure (hypertension). It may also reduce your risk for type 2 diabetes, heart disease, and stroke. The DASH eating plan may also help with weight loss. What are tips for following this plan?  General guidelines  Avoid eating more than 2,300 mg (milligrams) of salt (sodium) a day. If you have hypertension, you may need to reduce your sodium intake to 1,500 mg a day.  Limit alcohol intake to no more than 1 drink a day for nonpregnant women and 2 drinks a day for men. One drink equals 12 oz of beer, 5 oz of wine, or 1 oz of hard liquor.  Work with your health care provider to maintain a healthy body weight or to lose weight. Ask what an ideal weight is for you.  Get at least 30 minutes of exercise that causes your heart to beat faster (aerobic exercise) most days of the week. Activities may include walking, swimming, or biking.  Work with your health care provider or diet and nutrition specialist (dietitian) to adjust your eating plan to your individual calorie needs. Reading food labels   Check food labels for the amount of sodium per serving. Choose foods with less than 5 percent of  the Daily Value of sodium. Generally, foods with less than 300 mg of sodium per serving fit into this eating plan.  To find whole grains, look for the word "whole" as the first word in the ingredient list. Shopping  Buy products labeled as "low-sodium" or "no salt added."  Buy fresh foods. Avoid canned foods and premade or frozen meals. Cooking  Avoid adding salt when cooking. Use salt-free seasonings or herbs instead of table salt or sea salt. Check with your health care provider or pharmacist before using salt substitutes.  Do not fry foods. Cook foods using healthy methods such as baking, boiling, grilling, and broiling instead.  Cook with heart-healthy oils, such as olive, canola, soybean, or sunflower oil. Meal planning  Eat a balanced  diet that includes: ? 5 or more servings of fruits and vegetables each day. At each meal, try to fill half of your plate with fruits and vegetables. ? Up to 6-8 servings of whole grains each day. ? Less than 6 oz of lean meat, poultry, or fish each day. A 3-oz serving of meat is about the same size as a deck of cards. One egg equals 1 oz. ? 2 servings of low-fat dairy each day. ? A serving of nuts, seeds, or beans 5 times each week. ? Heart-healthy fats. Healthy fats called Omega-3 fatty acids are found in foods such as flaxseeds and coldwater fish, like sardines, salmon, and mackerel.  Limit how much you eat of the following: ? Canned or prepackaged foods. ? Food that is high in trans fat, such as fried foods. ? Food that is high in saturated fat, such as fatty meat. ? Sweets, desserts, sugary drinks, and other foods with added sugar. ? Full-fat dairy products.  Do not salt foods before eating.  Try to eat at least 2 vegetarian meals each week.  Eat more home-cooked food and less restaurant, buffet, and fast food.  When eating at a restaurant, ask that your food be prepared with less salt or no salt, if possible. What foods are recommended? The items listed may not be a complete list. Talk with your dietitian about what dietary choices are best for you. Grains Whole-grain or whole-wheat bread. Whole-grain or whole-wheat pasta. Brown rice. [place]. Bulgur. Whole-grain and low-sodium cereals. Pita bread. Low-fat, low-sodium crackers. Whole-wheat flour tortillas. Vegetables Fresh or frozen vegetables (raw, steamed, roasted, or grilled). Low-sodium or reduced-sodium tomato and vegetable juice. Low-sodium or reduced-sodium tomato sauce and tomato paste. Low-sodium or reduced-sodium canned vegetables. Fruits All fresh, dried, or frozen fruit. Canned fruit in natural juice (without added sugar). Meat and other protein foods Skinless chicken or [place]. Ground chicken or [place]. Pork  with fat trimmed off. Fish and seafood. Egg whites. Dried beans, peas, or lentils. Unsalted nuts, nut butters, and seeds. Unsalted canned beans. Lean cuts of beef with fat trimmed off. Low-sodium, lean deli meat. Dairy Low-fat (1%) or fat-free (skim) milk. Fat-free, low-fat, or reduced-fat cheeses. Nonfat, low-sodium ricotta or cottage cheese. Low-fat or nonfat yogurt. Low-fat, low-sodium cheese. Fats and oils Soft margarine without trans fats. Vegetable oil. Low-fat, reduced-fat, or light mayonnaise and salad dressings (reduced-sodium). Canola, safflower, olive, soybean, and sunflower oils. Avocado. Seasoning and other foods Herbs. Spices. Seasoning mixes without salt. Unsalted popcorn and pretzels. Fat-free sweets. What foods are not recommended? The items listed may not be a complete list. Talk with your dietitian about what dietary choices are best for you. Grains Baked goods made with fat, such as croissants, muffins, or  some breads. Dry pasta or rice meal packs. Vegetables Creamed or fried vegetables. Vegetables in a cheese sauce. Regular canned vegetables (not low-sodium or reduced-sodium). Regular canned tomato sauce and paste (not low-sodium or reduced-sodium). Regular tomato and vegetable juice (not low-sodium or reduced-sodium). Angie Fava. Olives. Fruits Canned fruit in a light or heavy syrup. Fried fruit. Fruit in cream or butter sauce. Meat and other protein foods Fatty cuts of meat. Ribs. Fried meat. Berniece Salines. Sausage. Bologna and other processed lunch meats. Salami. Fatback. Hotdogs. Bratwurst. Salted nuts and seeds. Canned beans with added salt. Canned or smoked fish. Whole eggs or egg yolks. Chicken or [place] with skin. Dairy Whole or 2% milk, cream, and half-and-half. Whole or full-fat cream cheese. Whole-fat or sweetened yogurt. Full-fat cheese. Nondairy creamers. Whipped toppings. Processed cheese and cheese spreads. Fats and oils Butter. Stick margarine. Lard. Shortening. Ghee.  Bacon fat. Tropical oils, such as coconut, palm kernel, or palm oil. Seasoning and other foods Salted popcorn and pretzels. Onion salt, garlic salt, seasoned salt, table salt, and sea salt. Worcestershire sauce. Tartar sauce. Barbecue sauce. Teriyaki sauce. Soy sauce, including reduced-sodium. Steak sauce. Canned and packaged gravies. Fish sauce. Oyster sauce. Cocktail sauce. Horseradish that you find on the shelf. Ketchup. Mustard. Meat flavorings and tenderizers. Bouillon cubes. Hot sauce and Tabasco sauce. Premade or packaged marinades. Premade or packaged taco seasonings. Relishes. Regular salad dressings. Where to find more information:  National Heart, Lung, and [place]: https://wilson-eaton.com/  American Heart Association: www.heart.org Summary  The DASH eating plan is a healthy eating plan that has been shown to reduce high blood pressure (hypertension). It may also reduce your risk for type 2 diabetes, heart disease, and stroke.  With the DASH eating plan, you should limit salt (sodium) intake to 2,300 mg a day. If you have hypertension, you may need to reduce your sodium intake to 1,500 mg a day.  When on the DASH eating plan, aim to eat more fresh fruits and vegetables, whole grains, lean proteins, low-fat dairy, and heart-healthy fats.  Work with your health care provider or diet and nutrition specialist (dietitian) to adjust your eating plan to your individual calorie needs. This information is not intended to replace advice given to you by your health care provider. Make sure you discuss any questions you have with your health care provider. Document Released: 05/22/2011 Document Revised: 05/26/2016 Document Reviewed: 05/26/2016 Elsevier Interactive Patient Education  2019 [employer].   Heart Failure Action Plan A heart failure action plan helps you understand what to do when you have symptoms of heart failure. Follow the plan that was created by you and your health care  provider. Review your plan each time you visit your health care provider. Red zone These signs and symptoms mean you should get medical help right away:  You have trouble breathing when resting.  You have a dry cough that is getting worse.  You have swelling or pain in your legs or abdomen that is getting worse.  You suddenly gain more than 2-3 lb (0.9-1.4 kg) in a day, or more than 5 lb (2.3 kg) in one week. This amount may be more or less depending on your condition.  You have trouble staying awake or you feel confused.  You have chest pain.  You do not have an appetite.  You pass out. If you experience any of these symptoms:  Call your local emergency services (911 in the [place] right away or seek help at the emergency department of the nearest  hospital. Yellow zone These signs and symptoms mean your condition may be getting worse and you should make some changes:  You have trouble breathing when you are active or you need to sleep with extra pillows.  You have swelling in your legs or abdomen.  You gain 2-3 lb (0.9-1.4 kg) in one day, or 5 lb (2.3 kg) in one week. This amount may be more or less depending on your condition.  You get tired easily.  You have trouble sleeping.  You have a dry cough. If you experience any of these symptoms:  Contact your health care provider within the next day.  Your health care provider may adjust your medicines. Green zone These signs mean you are doing well and can continue what you are doing:  You do not have shortness of breath.  You have very little swelling or no new swelling.  Your weight is stable (no gain or loss).  You have a normal activity level.  You do not have chest pain or any other new symptoms. Follow these instructions at home:  Take over-the-counter and prescription medicines only as told by your health care provider.  Weigh yourself daily. Your target weight is __________ lb (__________ kg). ? Call your  health care provider if you gain more than __________ lb (__________ kg) in a day, or more than __________ lb (__________ kg) in one week.  Eat a heart-healthy diet. Work with a diet and nutrition specialist (dietitian) to create an eating plan that is best for you.  Keep all follow-up visits as told by your health care provider. This is important. Where to find more information  American Heart Association: www.heart.org Summary  Follow the action plan that was created by you and your health care provider.  Get help right away if you have any symptoms in the Red zone. This information is not intended to replace advice given to you by your health care provider. Make sure you discuss any questions you have with your health care provider. Document Released: 07/12/2016 Document Revised: 02/04/2017 Document Reviewed: 07/12/2016 Elsevier Interactive Patient Education  2019 [employer].

## 2018-07-27 NOTE — Progress Notes (Signed)
Family Medicine Teaching Service Daily Progress Note Intern Pager: [phone]  Patient name: [name] Medical record number: [id number] Date of birth: 1956/01/03 Age: 63 y.o. Gender: female  Primary Care Provider: [place] Bing, DO Consultants: cardiology Code Status: Full code  Pt Overview and Major Events to Date:  Hospital Day 2 Admitted: 07/22/2018  Assessment and Plan: Mercedes A Carteris a 63 y.o.female with a past medical history significant for T2DM, HTN, fibromyalgia, depression who presented with dyspnea with a new diagnosis of HFrEF based on Echo.  #new onset HFrEF LV Cath did not show significant coronary artery disease. TEE ordered.  Patient did not receive lasix yesterday. 300 ml UOP on 2/9, 800 ml  On 2/10.  Weight 67.1>>56.2kg>58.5kg   -f/u cardiology, appreciate recs: TEE Tuesday, metoprolol tartrate 25mg  BID, plan to restart candesartan after cath - TEE can be done outpatient. --Continue strict intake and output and daily weights --O2 as needed to maintain Sat >88%  - ambulate with PT - start lasix 20mg  PO outpatient  Tachycardia- resolved Patient had an episode of tachycardia in the 150s yesterday after her cath. Patient was given 5mg  lopressor IV. Has not had any other episodes of tachycardia.  -Continue to monitor  - increase metoprolol 37.5 mg PO BID - increase metoprolol succinate to 75mg  on discharge  #Hypokalemia, resolved 4.9 today.    #T2DM  Last A1c 02/2018 was 8.6. Home medsincludesliding scale insulin, metformin, tresiba 26u daily. Endorses lack of adherence to meds at home over last 2 weeks due to dyspnea.  A1c10.6 in May 2019.  Lantus 15 U starting this AM. pateint used 10 U aspart yestderday.  CBG ranged from 96-364.  --increase lantus to 15 U.  -sSSI   #Bipolar Disorder Well controlled on home meds --Continue Remeron 15 mg daily   #HTN BP this am 118/62. Home meds includemetoprolol 50 mg q24, candesartan 4 mg  daily --lopressor 37.5 mg BID - start irbesartan in setting of diuresis, plan to restart after cath procedure  #Fibromyalgia Gabapentin 100 mg QID on med list. Restarted as PRN.  HLD - continue atorvastatin daily - per cards, holding zetia for now, repeate lipid and alt in 6 weeks and restart if LDL not at goal.   FEN/GI:Dysphagia 2 diet, Ensure feeding supplement TID  Prophylaxis:lovenox  Disposition: home   Medications: Scheduled Meds: . aspirin EC  81 mg Oral Daily  . atorvastatin  80 mg Oral q morning - 10a  . enoxaparin (LOVENOX) injection  40 mg Subcutaneous Q24H  . ezetimibe  10 mg Oral Daily  . feeding supplement (ENSURE ENLIVE)  237 mL Oral TID BM  . insulin aspart  0-5 Units Subcutaneous QHS  . insulin aspart  0-9 Units Subcutaneous TID WC  . insulin glargine  15 Units Subcutaneous Daily  . metoprolol tartrate  25 mg Oral BID  . mirtazapine  15 mg Oral QHS  . multivitamin with minerals  1 tablet Oral Daily  . sodium chloride flush  3 mL Intravenous Q12H   Continuous Infusions: . sodium chloride     PRN Meds: sodium chloride, acetaminophen, gabapentin, ipratropium-albuterol, ondansetron (ZOFRAN) IV, sodium chloride flush  ================================================= ================================================= Subjective:  Patient had back pain yesterday on the left side after her cath that resolved with tylenol.  She says she has had chest 'pressure' for several months, but no pain. Sometimes her heart 'flutter's' when she wakes up at night. No associated chest pain.   Objective: Temp:  [97.8 F (36.6 C)-98.4 F (36.9  C)] 97.8 F (36.6 C) (02/11 0350) Pulse Rate:  [77-98] 77 (02/11 0350) Resp:  [11-40] 16 (02/11 0350) BP: (97-151)/(52-86) 118/62 (02/11 0350) SpO2:  [88 %-100 %] 100 % (02/11 0350) Weight:  [58.5 kg] 58.5 kg (02/11 0350) Intake/Output No intake/output data recorded. Physical Exam:  Gen: NAD, alert, non-toxic,  well-nourished, well-appearing, sitting comfortably  HEENT: Normocephaic, atraumatic. Clear conjuctiva, no scleral icterus and injection.  CV: Regular rate and rhythm.   Radial pulses 2+ bilaterally. No bilateral lower extremity edema. Resp:  Bibasilar end inspiratory crackles heard on exam, R>L. Improved from yesterday. Normal work of breathing. On room air.    Abd: LLQ tenderness.  Positive bowel sounds. Psych: Cooperative with exam. Pleasant. Makes eye contact. Extremities: Full ROM    Laboratory: Recent Labs  Lab 07/22/18 2017 07/23/18 1122 07/24/18 0655 07/26/18 1524 07/26/18 1528  WBC 6.0 5.8 6.7  --   --   HGB 11.8* 12.3 12.6 13.3 13.6  HCT 36.9 37.7 39.8 39.0 40.0  PLT 284 297 281  --   --    Recent Labs  Lab 07/22/18 2017  07/25/18 0937 07/25/18 1523 07/25/18 1811 07/26/18 0544 07/26/18 1524 07/26/18 1528  NA 138   < > 141 137  --  136 139 139  K 3.4*   < > 5.3* 5.1  --  4.7 4.0 4.0  CL 104   < > 96* 92*  --  93*  --   --   CO2 26   < > 33* 34*  --  31  --   --   BUN 10   < > 16 23  --  24*  --   --   CREATININE 0.73   < > 1.04* 1.23*  --  1.09*  --   --   CALCIUM 8.6*   < > 10.3 10.5*  --  10.4*  --   --   PROT 6.4*  --   --   --   --   --   --   --   BILITOT 0.7  --   --   --   --   --   --   --   ALKPHOS 131*  --   --   --   --   --   --   --   ALT 38  --   --   --   --   --   --   --   AST 49*  --   --   --   --   --   --   --   GLUCOSE 254*   < > 247* 323* 117* 326*  --   --    < > = values in this interval not displayed.  *   Imaging/Diagnostic Tests: No results found.    Benay Pike, MD 07/27/2018, 6:18 AM PGY-1, [place] Intern pager: [phone], text pages welcome

## 2018-07-27 NOTE — Progress Notes (Signed)
Received multiple pages yesterday bw 630-700pm that had incorrect callback number on it.  At 7pm was paged with correct callback number about patient's tachycardia.  Ordered 5mg  IV metoprolol and continue her PO metop dose at regular time.

## 2018-07-28 ENCOUNTER — Ambulatory Visit: Payer: Medicare HMO | Admitting: Family Medicine

## 2018-07-30 ENCOUNTER — Encounter: Payer: Self-pay | Admitting: Family Medicine

## 2018-07-30 ENCOUNTER — Other Ambulatory Visit: Payer: Self-pay

## 2018-07-30 ENCOUNTER — Ambulatory Visit (INDEPENDENT_AMBULATORY_CARE_PROVIDER_SITE_OTHER): Payer: Medicare HMO | Admitting: Family Medicine

## 2018-07-30 VITALS — BP 120/72 | HR 88 | Temp 97.6°F | Ht 62.0 in | Wt 130.8 lb

## 2018-07-30 DIAGNOSIS — E1169 Type 2 diabetes mellitus with other specified complication: Secondary | ICD-10-CM | POA: Diagnosis not present

## 2018-07-30 DIAGNOSIS — R69 Illness, unspecified: Secondary | ICD-10-CM | POA: Diagnosis not present

## 2018-07-30 DIAGNOSIS — F3189 Other bipolar disorder: Secondary | ICD-10-CM

## 2018-07-30 DIAGNOSIS — E785 Hyperlipidemia, unspecified: Secondary | ICD-10-CM | POA: Diagnosis not present

## 2018-07-30 DIAGNOSIS — I10 Essential (primary) hypertension: Secondary | ICD-10-CM

## 2018-07-30 DIAGNOSIS — F3341 Major depressive disorder, recurrent, in partial remission: Secondary | ICD-10-CM

## 2018-07-30 DIAGNOSIS — I5021 Acute systolic (congestive) heart failure: Secondary | ICD-10-CM

## 2018-07-30 MED ORDER — DICLOFENAC SODIUM 3 % TD GEL: 1.0000 "application " | Freq: Two times a day (BID) | TRANSDERMAL | 0 refills | Status: DC | PRN

## 2018-07-30 MED ORDER — NICOTINE 21 MG/24HR TD PT24: 21.0000 mg | MEDICATED_PATCH | Freq: Every day | TRANSDERMAL | 0 refills | Status: DC

## 2018-07-30 MED ORDER — MIRTAZAPINE 15 MG PO TABS: 15.0000 mg | ORAL_TABLET | Freq: Every day | ORAL | 2 refills | Status: DC

## 2018-07-30 NOTE — Patient Instructions (Signed)
Heart Failure Action Plan A heart failure action plan helps you understand what to do when you have symptoms of heart failure. Follow the plan that was created by you and your health care provider. Review your plan each time you visit your health care provider. Red zone These signs and symptoms mean you should get medical help right away:  You have trouble breathing when resting.  You have a dry cough that is getting worse.  You have swelling or pain in your legs or abdomen that is getting worse.  You suddenly gain more than 2-3 lb (0.9-1.4 kg) in a day, or more than 5 lb (2.3 kg) in one week. This amount may be more or less depending on your condition.  You have trouble staying awake or you feel confused.  You have chest pain.  You do not have an appetite.  You pass out. If you experience any of these symptoms:  Call your local emergency services (911 in the [place] right away or seek help at the emergency department of the nearest hospital. Yellow zone These signs and symptoms mean your condition may be getting worse and you should make some changes:  You have trouble breathing when you are active or you need to sleep with extra pillows.  You have swelling in your legs or abdomen.  You gain 2-3 lb (0.9-1.4 kg) in one day, or 5 lb (2.3 kg) in one week. This amount may be more or less depending on your condition.  You get tired easily.  You have trouble sleeping.  You have a dry cough. If you experience any of these symptoms:  Contact your health care provider within the next day.  Your health care provider may adjust your medicines. Green zone These signs mean you are doing well and can continue what you are doing:  You do not have shortness of breath.  You have very little swelling or no new swelling.  Your weight is stable (no gain or loss).  You have a normal activity level.  You do not have chest pain or any other new symptoms. Follow these instructions at  home:  Take over-the-counter and prescription medicines only as told by your health care provider.  Weigh yourself daily. Your target weight is __________ lb (__________ kg). ? Call your health care provider if you gain more than __________ lb (__________ kg) in a day, or more than __________ lb (__________ kg) in one week.  Eat a heart-healthy diet. Work with a diet and nutrition specialist (dietitian) to create an eating plan that is best for you.  Keep all follow-up visits as told by your health care provider. This is important. Where to find more information  American Heart Association: www.heart.org Summary  Follow the action plan that was created by you and your health care provider.  Get help right away if you have any symptoms in the Red zone. This information is not intended to replace advice given to you by your health care provider. Make sure you discuss any questions you have with your health care provider. Document Released: 07/12/2016 Document Revised: 02/04/2017 Document Reviewed: 07/12/2016 Elsevier Interactive Patient Education  2019 [employer].

## 2018-07-30 NOTE — Assessment & Plan Note (Addendum)
Stable. Improved on increased Metoprolol to 75mg  daily and starting Lasix 20mg  daily. - Daily weights and educated to take extra lasix pill if weight gain >3lbs in one day, and if >5lbs over 2 days to take extra lasix and call the clinic to get an appointment - Low salt diet (<2g per day)

## 2018-07-30 NOTE — Progress Notes (Signed)
Subjective: Chief Complaint  Patient presents with  . Follow-up    HPI: [name] is a 63 y.o. presenting to clinic today to discuss the following:  Hospital Follow Up Patient was recently hospitalized from 2/6-2/11 due to CHF exacerbation for which she was diuresed and had work up. Her echo showed LVEF of 45-50% with mild MR. Her left heart cath showed no significant coronary artery stenosis or blockage. Her metoprolol was increased to 75mg  daily and she was started on lasix and educated about taking daily weights and avoiding high intake of salt.   Overall, she is doing much better today and is no longer having SOB. She did have an episode of when she gets active in the beginning she feels fatigued but it improves as she increases her activity. We discussed that this is most likely deconditioning after being in the hospital and it will improve with activity and time.  No fever, chills, chest pain, dyspnea, cough, abdominal pain, nausea, vomiting, diarrhea, or constipation.   ROS noted in HPI.   Past Medical, Surgical, Social, and Family History Reviewed & Updated per EMR.   Pertinent Historical Findings include:   Social History   Tobacco Use  Smoking Status Current Every Day Smoker  . Packs/day: 0.50  . Years: 43.00  . Pack years: 21.50  . Types: Cigarettes  Smokeless Tobacco Never Used    Objective: BP 120/72   Pulse 88   Temp 97.6 F (36.4 C) (Oral)   Ht 5\' 2"  (1.575 m)   Wt 130 lb 12.8 oz (59.3 kg)   SpO2 99%   BMI 23.92 kg/m  Vitals and nursing notes reviewed  Physical Exam Gen: Alert and Oriented x 3, NAD HEENT: Normocephalic, atraumatic CV: RRR, no murmurs, normal S1, S2 split Resp: CTAB, no wheezing, rales, or rhonchi, comfortable work of breathing MSK: Moves all four extremities Ext: no clubbing, cyanosis, or edema Skin: warm, dry, intact, no rashes  No results found for this or any previous visit (from the past 72  hour(s)).  Assessment/Plan:  Acute systolic CHF (congestive heart failure) (HCC) Stable. Improved on increased Metoprolol to 75mg  daily and starting Lasix 20mg  daily. - Daily weights and educated to take extra lasix pill if weight gain >3lbs in one day, and if >5lbs over 2 days to take extra lasix and call the clinic to get an appointment - Low salt diet (<2g per day)  Hyperlipidemia due to type 2 diabetes mellitus [facility]) Was seen by Cardiology while in the hospital and her Lionel December was stopped. They suggested a lipid panel and LFT check in 6 weeks and restart if still high after recheck. - Future order for lipid panel and LFTs for 6 weeks - Appointment to follow up in 2 months  Smoking Cessation Patient requested 21mg  patch up from the 14mg . She has not had a cigarette in the past month.  PATIENT EDUCATION PROVIDED: See AVS    Diagnosis and plan along with any newly prescribed medication(s) were discussed in detail with this patient today. The patient verbalized understanding and agreed with the plan. Patient advised if symptoms worsen return to clinic or ER.    Orders Placed This Encounter  Procedures  . Lipid Panel    Standing Status:   Future    Standing Expiration Date:   07/31/2019  . Basic Metabolic Panel  . Comprehensive metabolic panel    Standing Status:   Future    Standing Expiration Date:   07/31/2019  Order Specific Question:   Has the patient fasted?    Answer:   No    Meds ordered this encounter  Medications  . mirtazapine (REMERON) 15 MG tablet    Sig: Take 1 tablet (15 mg total) by mouth at bedtime.    Dispense:  30 tablet    Refill:  2  . nicotine (NICODERM CQ - DOSED IN MG/24 HOURS) 21 mg/24hr patch    Sig: Place 1 patch (21 mg total) onto the skin daily.    Dispense:  28 patch    Refill:  0     Tim Garlan Fillers, DO 07/30/2018, 9:30 AM PGY-2 [place]

## 2018-07-30 NOTE — Addendum Note (Signed)
Addended by: Ezzard Flax on: 07/30/2018 03:04 PM   Modules accepted: Orders

## 2018-07-30 NOTE — Assessment & Plan Note (Signed)
Was seen by Cardiology while in the hospital and her Lionel December was stopped. They suggested a lipid panel and LFT check in 6 weeks and restart if still high after recheck. - Future order for lipid panel and LFTs for 6 weeks - Appointment to follow up in 2 months

## 2018-07-31 LAB — BASIC METABOLIC PANEL
BUN/Creatinine Ratio: 20 (ref 12–28)
BUN: 26 mg/dL (ref 8–27)
CO2: 29 mmol/L (ref 20–29)
Calcium: 10.2 mg/dL (ref 8.7–10.3)
Chloride: 96 mmol/L (ref 96–106)
Creatinine, Ser: 1.32 mg/dL — ABNORMAL HIGH (ref 0.57–1.00)
GFR calc Af Amer: 50 mL/min/{1.73_m2} — ABNORMAL LOW (ref >59)
GFR calc non Af Amer: 43 mL/min/{1.73_m2} — ABNORMAL LOW (ref >59)
Glucose: 109 mg/dL — ABNORMAL HIGH (ref 65–99)
Potassium: 5.4 mmol/L — ABNORMAL HIGH (ref 3.5–5.2)
Sodium: 139 mmol/L (ref 134–144)

## 2018-08-02 ENCOUNTER — Encounter: Payer: Self-pay | Admitting: Physician Assistant

## 2018-08-02 NOTE — Progress Notes (Signed)
Cardiology Office Note    Date:  08/03/2018  ID:  [name], DOB 1956-04-30, MRN [id number] PCP:  Bodcaw Bing, DO  Cardiologist:  Fransico Him, MD   Chief Complaint: f/u CHF and discuss further cardiac testing  History of Present Illness:  [name] is a 63 y.o. female with history of DM, dyslipidemia (followed by PCP), HTN, GERD, tobacco abuse, bipolar disorder, THC use, recent acute combined CHF/NICM, mitral regurgitation, tricuspid regurgitation and aortic regurgitation who is here for post-hospital f/u. She was remotely followed by Dr. Radford Pax for atypical CP and diastolic dysfunction with normal EF in 2015. She was recently admitted 07/22/18 with progressive worsening dyspnea, orthopnea and lower extremity edema. CT angio showed no PE but did show aortic atheroscerlosis and cardiac enlargement with bilateral pulmonary effusions, atx and likely pulmonary edema. She was found to have new onset CHF with echo showing EF 45-50%, moderate DD, moderate MR, moderate TR, mild-mod AR. She ruled out for MI and was diuresed. Cardiac cath 07/26/18 showed no significant CAD - mild luminal irregularities of the RCa/LAD, low intracardiac filling pressures and preserved cardiac output. Cath hemodynamics were not suggestive of severe MR. TEE was suggested for mitral valve disorder but endo schedule was full and it was recommended to pursue as outpatient. Telemetry did show SVT felt to possibly represent nonsustained atrial tachycardia and outpatient event monitor was recommended. Her Lasix was held for one day at discharge then resumed at 18m daily. With diuresis her Cr went to 1.18, further increased to 1.32 on 07/30/18 follow-up with K of 5.4 by primary care recheck - no result note listed on this yet. Otherwise recent hospital labs showed albumin 3.1, normal AST/ALT, normal CBC, TSH normal, Mg 1.9, LDL 100. Cr was 0.73 on admission.  She returns for follow-up overall feeling OK. She has noticed  orthostatic type dizziness since discharge. Her SOB has remained improved but she does note sometimes in the shower she gets SOB. She denies any CP or bleeding. No LEE. Still having nightly palpitations lasting a few minutes at a time.   Past Medical History:  Diagnosis Date  . Allergy   . Anxiety   . Aortic insufficiency    a. mild-mod by echo 07/2018.  .Marland KitchenArthritis   . Bipolar 2 disorder ([facility]   . Cataract   . Chest pain   . Chronic combined systolic and diastolic CHF (congestive heart failure) ([place]   . Depression   . DM (diabetes mellitus), type 2 ([place]   . GERD (gastroesophageal reflux disease)   . HTN (hypertension)   . Hypercholesteremia   . Marijuana abuse   . Moderate mitral regurgitation   . Moderate tricuspid regurgitation   . NICM (nonischemic cardiomyopathy) ([place]   . Renal insufficiency   . Trigger finger    r index inj 12/08, bilateral thumb inj 11/09, r thumb and dequervain's inj 01/2009    Past Surgical History:  Procedure Laterality Date  . ABDOMINAL HYSTERECTOMY  1986   fibroids  . CARDIAC CATHETERIZATION  05/06/2004   normal LV fxn EF>60%  . CYSTECTOMY     back of head  . LEFT HEART CATH AND CORONARY ANGIOGRAPHY N/A 07/26/2018   Procedure: LEFT HEART CATH AND CORONARY ANGIOGRAPHY;  Surgeon: CSherren Mocha MD;  [facility];  Service: Cardiovascular;  Laterality: N/A;  . LEFT HEART CATHETERIZATION WITH CORONARY ANGIOGRAM N/A 05/26/2011   Procedure: LEFT HEART CATHETERIZATION WITH CORONARY ANGIOGRAM;  Surgeon: DLarey Dresser MD;  Location: [facility];  Service: Cardiovascular;  Laterality: N/A;  radial  . perirpheral iridectomy  02/12/2005    Current Medications: Current Meds  Medication Sig  . acetaminophen (TYLENOL) 500 MG tablet Take 1,000 mg by mouth every 6 (six) hours as needed for mild pain.  [name] atorvastatin (LIPITOR) 80 MG tablet Take 1 tablet (80 mg total) by mouth every morning.  . Blood Glucose Monitoring Suppl (ONE TOUCH  ULTRA 2) W/DEVICE KIT 1 kit by Does not apply route once.  . candesartan (ATACAND) 4 MG tablet Take 1 tablet (4 mg total) by mouth daily.  . feeding supplement, ENSURE ENLIVE, (ENSURE ENLIVE) LIQD Take 237 mLs by mouth 3 (three) times daily between meals.  . furosemide (LASIX) 20 MG tablet Take 1 tablet (20 mg total) by mouth daily for 30 days.  [name] glucose blood (ONE TOUCH ULTRA TEST) test strip Test 3 times a day.  . insulin aspart (NOVOLOG) 100 UNIT/ML injection Inject 4-8 Units into the skin 2 (two) times daily with a meal.  . insulin degludec (TRESIBA FLEXTOUCH) 100 UNIT/ML SOPN FlexTouch Pen Inject 0.26 mLs (26 Units total) into the skin daily.  . Lancets (ONETOUCH ULTRASOFT) lancets Use as instructed  . metoprolol succinate (TOPROL-XL) 25 MG 24 hr tablet Take 3 tablets (75 mg total) by mouth daily. TAKE ONE TABLET BY MOUTH ONCE DAILY WITH  OR  IMMEDIATELY  FOLLOWING  A  MEAL  . mirtazapine (REMERON) 15 MG tablet Take 1 tablet (15 mg total) by mouth at bedtime.  . nicotine (NICODERM CQ - DOSED IN MG/24 HOURS) 21 mg/24hr patch Place 1 patch (21 mg total) onto the skin daily.  . ONE TOUCH LANCETS MISC 1 Device by Does not apply route 3 (three) times daily.  . ranitidine (ZANTAC) 150 MG tablet Take 300 mg by mouth daily.     Allergies:   Ibuprofen and Lisinopril   Social History   Socioeconomic History  . Marital status: Married    Spouse name: Not on file  . Number of children: Not on file  . Years of education: Not on file  . Highest education level: Not on file  Occupational History  . 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 Financial resource strain: Not on file  . Food insecurity:    Worry: Not on file    Inability: Not on file  . Transportation needs:    Medical: Not on file    Non-medical: Not on file  Tobacco Use  . Smoking status: Current Every Day Smoker    Packs/day: 0.50    Years: 43.00     Pack years: 21.50    Types: Cigarettes  . Smokeless tobacco: Never Used  Substance and Sexual Activity  . Alcohol use: No    Alcohol/week: 0.0 standard drinks  . Drug use: No    Frequency: 7.0 times per week    Types: Marijuana    Comment: not any longer pt denies  . Sexual activity: Never    Partners: Male  Lifestyle  . Physical activity:    Days per week: Not on file    Minutes per session: Not on file  . Stress: Not on file  Relationships  . Social connections:    Talks on phone: Not on file    Gets together: Not on file    Attends religious service: Not on file  Active member of club or organization: Not on file    Attends meetings of clubs or organizations: Not on file    Relationship status: Not on file  Other Topics Concern  . Not on file  Social History Narrative   Finances are a major issue. Meeting with [place]. Rejected by healthcare sharing initiative   currently receiving med assistance for lantus through our office and lyrica.      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 Pt in process of obtaining disability.      Family History:  The patient's family history includes Asthma in her son and son; Bipolar disorder in her brother, mother, and sister; Diabetes in her brother, brother, brother, brother, father, mother, sister, sister, sister, sister, sister, and sister; Heart attack in her mother; Heart disease in her mother; Hypertension in her sister. There is no history of Colon cancer or Esophageal cancer.  ROS:   Please see the history of present illness.  All other systems are reviewed and otherwise negative.    PHYSICAL EXAM:   VS:  BP 124/72   Pulse 90   Ht 5' 2" (1.575 m)   Wt 132 lb 12.8 oz (60.2 kg)   SpO2 100%   BMI 24.29 kg/m   BMI: Body mass index is 24.29 kg/m. GEN: Thin AAF, in no acute distress HEENT:  normocephalic, atraumatic Neck: no JVD, carotid bruits, or masses Cardiac: RRR, split S2, soft apical murmur; no murmurs, rubs, or gallops, no edema  Respiratory:  clear to auscultation bilaterally, normal work of breathing GI: soft, nontender, nondistended, + BS MS: no deformity or atrophy Skin: warm and dry, no rash. Right groin cath site without significant hematoma, ecchymosis, or bruit. Neuro:  Alert and Oriented x 3, Strength and sensation are intact, follows commands Psych: euthymic mood, full affect  Wt Readings from Last 3 Encounters:  08/03/18 132 lb 12.8 oz (60.2 kg)  07/30/18 130 lb 12.8 oz (59.3 kg)  07/27/18 129 lb (58.5 kg)      Studies/Labs Reviewed:   EKG:  EKG was not ordered today  Recent Labs: 07/22/2018: B Natriuretic Peptide 985.8 07/23/2018: Magnesium 1.9; TSH 2.620 07/24/2018: Platelets 281 07/26/2018: Hemoglobin 13.6 07/27/2018: ALT 19 07/30/2018: BUN 26; Creatinine, Ser 1.32; Potassium 5.4; Sodium 139   Lipid Panel    Component Value Date/Time   CHOL 166 07/23/2018 1122   TRIG 91 07/23/2018 1122   HDL 48 07/23/2018 1122   CHOLHDL 3.5 07/23/2018 1122   VLDL 18 07/23/2018 1122   LDLCALC 100 (H) 07/23/2018 1122   LDLDIRECT 237 (H) 06/25/2017 1337   LDLDIRECT 101 (H) 05/31/2013 1421    Additional studies/ records that were reviewed today include: Summarized above.   ASSESSMENT & PLAN:   1. Chronic combined CHF/NICM - weight is up 3lb from DC but clinically appears euvolemic, possibly even dry now based on rise in creatinine and orthostasis. Will hold candesartan for now and change Lasix to MWF. Titrate BB slowly as below to address palpitations and HR given MV regurgitation. Reviewed 2g sodium restriction, 2L fluid restriction, daily weights with patient. 2. Mitral regurgitation, also with tricuspid regurgitation and aortic insufficiency - plan outpatient TEE. Risks/benefits reviewed with patient who is agreeable to proceed. 3. SVT - 30 day event monitor  has already been ordered from the hospital. Titrate metoprolol to 4 tabs (176m) daily. If doing well on  this dose at next OV will consolidate to full tab. 4. AKI - labs on 2/14 showed rising Cr of 1.32 and K of 5.4. Given recent cath will further trend today on labs. Stop ARB as above for now and change Lasix to MWF. Repeat BMET in 1 week.  Disposition: F/u with Dr. Betsy Pries team APP in 1 week to f/u BP, HR, and discuss results of TEE.  Medication Adjustments/Labs and Tests Ordered: Current medicines are reviewed at length with the patient today.  Concerns regarding medicines are outlined above. Medication changes, Labs and Tests ordered today are summarized above and listed in the Patient Instructions accessible in Encounters.   Signed, Charlie Pitter, PA-C  08/03/2018 9:15 AM    [facility] [address], [place], [place]  [address] Phone: [phone]; Fax: [phone]

## 2018-08-02 NOTE — H&P (View-Only) (Signed)
 Cardiology Office Note    Date:  08/03/2018  ID:  [name], DOB 02/20/1956, MRN [id number] PCP:  McMullen, David J, DO  Cardiologist:  Traci Turner, MD   Chief Complaint: f/u CHF and discuss further cardiac testing  History of Present Illness:  [name] is a 62 y.o. female with history of DM, dyslipidemia (followed by PCP), HTN, GERD, tobacco abuse, bipolar disorder, THC use, recent acute combined CHF/NICM, mitral regurgitation, tricuspid regurgitation and aortic regurgitation who is here for post-hospital f/u. She was remotely followed by Dr. Turner for atypical CP and diastolic dysfunction with normal EF in 2015. She was recently admitted 07/22/18 with progressive worsening dyspnea, orthopnea and lower extremity edema. CT angio showed no PE but did show aortic atheroscerlosis and cardiac enlargement with bilateral pulmonary effusions, atx and likely pulmonary edema. She was found to have new onset CHF with echo showing EF 45-50%, moderate DD, moderate MR, moderate TR, mild-mod AR. She ruled out for MI and was diuresed. Cardiac cath 07/26/18 showed no significant CAD - mild luminal irregularities of the RCa/LAD, low intracardiac filling pressures and preserved cardiac output. Cath hemodynamics were not suggestive of severe MR. TEE was suggested for mitral valve disorder but endo schedule was full and it was recommended to pursue as outpatient. Telemetry did show SVT felt to possibly represent nonsustained atrial tachycardia and outpatient event monitor was recommended. Her Lasix was held for one day at discharge then resumed at 20mg daily. With diuresis her Cr went to 1.18, further increased to 1.32 on 07/30/18 follow-up with K of 5.4 by primary care recheck - no result note listed on this yet. Otherwise recent hospital labs showed albumin 3.1, normal AST/ALT, normal CBC, TSH normal, Mg 1.9, LDL 100. Cr was 0.73 on admission.  She returns for follow-up overall feeling OK. She has noticed  orthostatic type dizziness since discharge. Her SOB has remained improved but she does note sometimes in the shower she gets SOB. She denies any CP or bleeding. No LEE. Still having nightly palpitations lasting a few minutes at a time.   Past Medical History:  Diagnosis Date  . Allergy   . Anxiety   . Aortic insufficiency    a. mild-mod by echo 07/2018.  . Arthritis   . Bipolar 2 disorder (HCC)   . Cataract   . Chest pain   . Chronic combined systolic and diastolic CHF (congestive heart failure) (HCC)   . Depression   . DM (diabetes mellitus), type 2 (HCC)   . GERD (gastroesophageal reflux disease)   . HTN (hypertension)   . Hypercholesteremia   . Marijuana abuse   . Moderate mitral regurgitation   . Moderate tricuspid regurgitation   . NICM (nonischemic cardiomyopathy) (HCC)   . Renal insufficiency   . Trigger finger    r index inj 12/08, bilateral thumb inj 11/09, r thumb and dequervain's inj 01/2009    Past Surgical History:  Procedure Laterality Date  . ABDOMINAL HYSTERECTOMY  1986   fibroids  . CARDIAC CATHETERIZATION  05/06/2004   normal LV fxn EF>60%  . CYSTECTOMY     back of head  . LEFT HEART CATH AND CORONARY ANGIOGRAPHY N/A 07/26/2018   Procedure: LEFT HEART CATH AND CORONARY ANGIOGRAPHY;  Surgeon: Cooper, Michael, MD;  Location: [facility] INVASIVE [facility];  Service: Cardiovascular;  Laterality: N/A;  . LEFT HEART CATHETERIZATION WITH CORONARY ANGIOGRAM N/A 05/26/2011   Procedure: LEFT HEART CATHETERIZATION WITH CORONARY ANGIOGRAM;  Surgeon: Dalton S McLean, MD;    Location: [facility] CATH LAB;  Service: Cardiovascular;  Laterality: N/A;  radial  . perirpheral iridectomy  02/12/2005    Current Medications: Current Meds  Medication Sig  . acetaminophen (TYLENOL) 500 MG tablet Take 1,000 mg by mouth every 6 (six) hours as needed for mild pain.  . atorvastatin (LIPITOR) 80 MG tablet Take 1 tablet (80 mg total) by mouth every morning.  . Blood Glucose Monitoring Suppl (ONE TOUCH  ULTRA 2) W/DEVICE KIT 1 kit by Does not apply route once.  . candesartan (ATACAND) 4 MG tablet Take 1 tablet (4 mg total) by mouth daily.  . feeding supplement, ENSURE ENLIVE, (ENSURE ENLIVE) LIQD Take 237 mLs by mouth 3 (three) times daily between meals.  . furosemide (LASIX) 20 MG tablet Take 1 tablet (20 mg total) by mouth daily for 30 days.  . glucose blood (ONE TOUCH ULTRA TEST) test strip Test 3 times a day.  . insulin aspart (NOVOLOG) 100 UNIT/ML injection Inject 4-8 Units into the skin 2 (two) times daily with a meal.  . insulin degludec (TRESIBA FLEXTOUCH) 100 UNIT/ML SOPN FlexTouch Pen Inject 0.26 mLs (26 Units total) into the skin daily.  . Lancets (ONETOUCH ULTRASOFT) lancets Use as instructed  . metoprolol succinate (TOPROL-XL) 25 MG 24 hr tablet Take 3 tablets (75 mg total) by mouth daily. TAKE ONE TABLET BY MOUTH ONCE DAILY WITH  OR  IMMEDIATELY  FOLLOWING  A  MEAL  . mirtazapine (REMERON) 15 MG tablet Take 1 tablet (15 mg total) by mouth at bedtime.  . nicotine (NICODERM CQ - DOSED IN MG/24 HOURS) 21 mg/24hr patch Place 1 patch (21 mg total) onto the skin daily.  . ONE TOUCH LANCETS MISC 1 Device by Does not apply route 3 (three) times daily.  . ranitidine (ZANTAC) 150 MG tablet Take 300 mg by mouth daily.     Allergies:   Ibuprofen and Lisinopril   Social History   Socioeconomic History  . Marital status: Married    Spouse name: Not on file  . Number of children: Not on file  . Years of education: Not on file  . Highest education level: Not on file  Occupational History  . Occupation: CNA    Employer: [employer]    Comment: full time at  [employer]  Social Needs  . Financial resource strain: Not on file  . Food insecurity:    Worry: Not on file    Inability: Not on file  . Transportation needs:    Medical: Not on file    Non-medical: Not on file  Tobacco Use  . Smoking status: Current Every Day Smoker    Packs/day: 0.50    Years: 43.00     Pack years: 21.50    Types: Cigarettes  . Smokeless tobacco: Never Used  Substance and Sexual Activity  . Alcohol use: No    Alcohol/week: 0.0 standard drinks  . Drug use: No    Frequency: 7.0 times per week    Types: Marijuana    Comment: not any longer pt denies  . Sexual activity: Never    Partners: Male  Lifestyle  . Physical activity:    Days per week: Not on file    Minutes per session: Not on file  . Stress: Not on file  Relationships  . Social connections:    Talks on phone: Not on file    Gets together: Not on file    Attends religious service: Not on file      Active member of club or organization: Not on file    Attends meetings of clubs or organizations: Not on file    Relationship status: Not on file  Other Topics Concern  . Not on file  Social History Narrative   Finances are a major issue. Meeting with debra hill. Rejected by healthcare sharing initiative   currently receiving med assistance for lantus through our office and lyrica.      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 Pt in process of obtaining disability.      Family History:  The patient's family history includes Asthma in her son and son; Bipolar disorder in her brother, mother, and sister; Diabetes in her brother, brother, brother, brother, father, mother, sister, sister, sister, sister, sister, and sister; Heart attack in her mother; Heart disease in her mother; Hypertension in her sister. There is no history of Colon cancer or Esophageal cancer.  ROS:   Please see the history of present illness.  All other systems are reviewed and otherwise negative.    PHYSICAL EXAM:   VS:  BP 124/72   Pulse 90   Ht 5' 2" (1.575 m)   Wt 132 lb 12.8 oz (60.2 kg)   SpO2 100%   BMI 24.29 kg/m   BMI: Body mass index is 24.29 kg/m. GEN: Thin AAF, in no acute distress HEENT:  normocephalic, atraumatic Neck: no JVD, carotid bruits, or masses Cardiac: RRR, split S2, soft apical murmur; no murmurs, rubs, or gallops, no edema  Respiratory:  clear to auscultation bilaterally, normal work of breathing GI: soft, nontender, nondistended, + BS MS: no deformity or atrophy Skin: warm and dry, no rash. Right groin cath site without significant hematoma, ecchymosis, or bruit. Neuro:  Alert and Oriented x 3, Strength and sensation are intact, follows commands Psych: euthymic mood, full affect  Wt Readings from Last 3 Encounters:  08/03/18 132 lb 12.8 oz (60.2 kg)  07/30/18 130 lb 12.8 oz (59.3 kg)  07/27/18 129 lb (58.5 kg)      Studies/Labs Reviewed:   EKG:  EKG was not ordered today  Recent Labs: 07/22/2018: B Natriuretic Peptide 985.8 07/23/2018: Magnesium 1.9; TSH 2.620 07/24/2018: Platelets 281 07/26/2018: Hemoglobin 13.6 07/27/2018: ALT 19 07/30/2018: BUN 26; Creatinine, Ser 1.32; Potassium 5.4; Sodium 139   Lipid Panel    Component Value Date/Time   CHOL 166 07/23/2018 1122   TRIG 91 07/23/2018 1122   HDL 48 07/23/2018 1122   CHOLHDL 3.5 07/23/2018 1122   VLDL 18 07/23/2018 1122   LDLCALC 100 (H) 07/23/2018 1122   LDLDIRECT 237 (H) 06/25/2017 1337   LDLDIRECT 101 (H) 05/31/2013 1421    Additional studies/ records that were reviewed today include: Summarized above.   ASSESSMENT & PLAN:   1. Chronic combined CHF/NICM - weight is up 3lb from DC but clinically appears euvolemic, possibly even dry now based on rise in creatinine and orthostasis. Will hold candesartan for now and change Lasix to MWF. Titrate BB slowly as below to address palpitations and HR given MV regurgitation. Reviewed 2g sodium restriction, 2L fluid restriction, daily weights with patient. 2. Mitral regurgitation, also with tricuspid regurgitation and aortic insufficiency - plan outpatient TEE. Risks/benefits reviewed with patient who is agreeable to proceed. 3. SVT - 30 day event monitor  has already been ordered from the hospital. Titrate metoprolol to 4 tabs (100mg) daily. If doing well on   this dose at next OV will consolidate to full tab. 4. AKI - labs on 2/14 showed rising Cr of 1.32 and K of 5.4. Given recent cath will further trend today on labs. Stop ARB as above for now and change Lasix to MWF. Repeat BMET in 1 week.  Disposition: F/u with Dr. Turner/care team APP in 1 week to f/u BP, HR, and discuss results of TEE.  Medication Adjustments/Labs and Tests Ordered: Current medicines are reviewed at length with the patient today.  Concerns regarding medicines are outlined above. Medication changes, Labs and Tests ordered today are summarized above and listed in the Patient Instructions accessible in Encounters.   Signed, Dayna N Dunn, PA-C  08/03/2018 9:15 AM    [facility] [address], [place], [place]  [address] Phone: [phone]; Fax: [phone]  

## 2018-08-03 ENCOUNTER — Encounter: Payer: Self-pay | Admitting: *Deleted

## 2018-08-03 ENCOUNTER — Encounter: Payer: Self-pay | Admitting: Physician Assistant

## 2018-08-03 ENCOUNTER — Ambulatory Visit (INDEPENDENT_AMBULATORY_CARE_PROVIDER_SITE_OTHER): Payer: Medicare HMO | Admitting: Physician Assistant

## 2018-08-03 VITALS — BP 124/72 | HR 90 | Ht 62.0 in | Wt 132.8 lb

## 2018-08-03 DIAGNOSIS — I10 Essential (primary) hypertension: Secondary | ICD-10-CM | POA: Diagnosis not present

## 2018-08-03 DIAGNOSIS — I340 Nonrheumatic mitral (valve) insufficiency: Secondary | ICD-10-CM | POA: Diagnosis not present

## 2018-08-03 DIAGNOSIS — I351 Nonrheumatic aortic (valve) insufficiency: Secondary | ICD-10-CM | POA: Diagnosis not present

## 2018-08-03 DIAGNOSIS — I5042 Chronic combined systolic (congestive) and diastolic (congestive) heart failure: Secondary | ICD-10-CM

## 2018-08-03 DIAGNOSIS — I471 Supraventricular tachycardia: Secondary | ICD-10-CM | POA: Diagnosis not present

## 2018-08-03 DIAGNOSIS — N179 Acute kidney failure, unspecified: Secondary | ICD-10-CM

## 2018-08-03 DIAGNOSIS — I071 Rheumatic tricuspid insufficiency: Secondary | ICD-10-CM | POA: Diagnosis not present

## 2018-08-03 LAB — BASIC METABOLIC PANEL
BUN/Creatinine Ratio: 19 (ref 12–28)
BUN: 18 mg/dL (ref 8–27)
CHLORIDE: 103 mmol/L (ref 96–106)
CO2: 23 mmol/L (ref 20–29)
Calcium: 10.3 mg/dL (ref 8.7–10.3)
Creatinine, Ser: 0.97 mg/dL (ref 0.57–1.00)
GFR calc Af Amer: 72 mL/min/{1.73_m2} (ref >59)
GFR calc non Af Amer: 62 mL/min/{1.73_m2} (ref >59)
Glucose: 135 mg/dL — ABNORMAL HIGH (ref 65–99)
POTASSIUM: 5.1 mmol/L (ref 3.5–5.2)
SODIUM: 139 mmol/L (ref 134–144)

## 2018-08-03 MED ORDER — FUROSEMIDE 20 MG PO TABS: ORAL_TABLET | ORAL | 3 refills | Status: DC

## 2018-08-03 MED ORDER — METOPROLOL SUCCINATE ER 25 MG PO TB24: 100.0000 mg | ORAL_TABLET | Freq: Every day | ORAL | Status: DC

## 2018-08-03 NOTE — Patient Instructions (Addendum)
Medication Instructions:  Your physician has recommended you make the following change in your medication:  1.  CHANGE the Lasix to only Mondays, Wednesdays, & Fridays 2.  INCREASE the Metoprolol to 4 tablets daily 3.  STOP the Candesartan   If you need a refill on your cardiac medications before your next appointment, please call your pharmacy.   Lab work: TODAY:  STAT BMET 1 WEEK:  BMET  If you have labs (blood work) drawn today and your tests are completely normal, you will receive your results only by: [name] MyChart Message (if you have MyChart) OR . A paper copy in the mail If you have any lab test that is abnormal or we need to change your treatment, we will call you to review the results.  Testing/Procedures: Your physician has requested that you have a TEE (08/05/18)  SEE INSTRUCTION SHEET GIVEN.  During a TEE, sound waves are used to create images of your heart. It provides your doctor with information about the size and shape of your heart and how well your heart's chambers and valves are working. In this test, a transducer is attached to the end of a flexible tube that's guided down your throat and into your esophagus (the tube leading from you mouth to your stomach) to get a more detailed image of your heart. You are not awake for the procedure. Please see the instruction sheet given to you today. For further information please visit [email or web address][email or web address].  Your physician has recommended that you wear an event monitor. Event monitors are medical devices that record the heart's electrical activity. Doctors most often [place] these monitors to diagnose arrhythmias. Arrhythmias are problems with the speed or rhythm of the heartbeat. The monitor is a small, portable device. You can wear one while you do your normal daily activities. This is usually used to diagnose what is causing palpitations/syncope (passing out).    Follow-Up: Your physician recommends that you schedule a follow-up  appointment in: 08/10/2018 ARRIVE AT 10:45 TO SEE VIN BHAGAT, PA-C   Any Other Special Instructions Will Be Listed Below (If Applicable).  Smoking Cessation # is :  [phone]   Transesophageal Echocardiogram Transesophageal echocardiogram (TEE) is a test that uses sound waves to take pictures of your heart. TEE is done by passing a flexible tube down the esophagus. The esophagus is the tube that carries food from the throat to the stomach. The pictures give detailed images of your heart. This can help your doctor see if there are problems with your heart. What happens before the procedure? Staying hydrated Follow instructions from your doctor about hydration, which may include:  Up to 3 hours before the procedure - you may continue to drink clear liquids, such as: ? Water. ? Clear fruit juice. ? Black coffee. ? Plain tea.  Eating and drinking Follow instructions from your doctor about eating and drinking, which may include:  8 hours before the procedure - stop eating heavy meals or foods such as meat, fried foods, or fatty foods.  6 hours before the procedure - stop eating light meals or foods, such as toast or cereal.  6 hours before the procedure - stop drinking milk or drinks that contain milk.  3 hours before the procedure - stop drinking clear liquids. General instructions  You will need to take out any dentures or retainers.  Plan to have someone take you home from the hospital or clinic.  If you will be going home right after the procedure, plan to  have someone with you for 24 hours.  Ask your doctor about: ? Changing or stopping your normal medicines. This is important if you take diabetes medicines or blood thinners. ? Taking over-the-counter medicines, vitamins, herbs, and supplements. ? Taking medicines such as aspirin and ibuprofen. These medicines can thin your blood. Do not take these medicines unless your doctor tells you to take them. What happens during  the procedure?  To lower your risk of infection, your doctors will wash or clean their hands.  An IV will be put into one of your veins.  You will be given a medicine to help you relax (sedative).  A medicine may be sprayed or gargled. This numbs the back of your throat.  Your blood pressure, heart rate, and breathing will be watched.  You may be asked to lay on your left side.  A bite block will be placed in your mouth. This keeps you from biting the tube.  The tip of the TEE probe will be placed into the back of your mouth.  You will be asked to swallow.  Your doctor will take pictures of your heart.  The probe and bite block will be taken out. The procedure may vary among doctors and hospitals. What happens after the procedure?   Your blood pressure, heart rate, breathing rate, and blood oxygen level will be watched until the medicines you were given have worn off.  When you first wake up, your throat may feel sore and numb. This will get better over time. You will not be allowed to eat or drink until the numbness has gone away.  Do not drive for 24 hours if you were given a medicine to help you relax. Summary  TEE is a test that uses sound waves to take pictures of your heart.  You will be given a medicine to help you relax.  Do not drive for 24 hours if you were given a medicine to help you relax. This information is not intended to replace advice given to you by your health care provider. Make sure you discuss any questions you have with your health care provider. Document Released: 03/30/2009 Document Revised: 02/19/2018 Document Reviewed: 09/03/2016 Elsevier Interactive Patient Education  2019 [employer].

## 2018-08-04 ENCOUNTER — Telehealth: Payer: Self-pay | Admitting: *Deleted

## 2018-08-04 NOTE — Telephone Encounter (Signed)
Pt returned my call and she has been made aware of her lab results 

## 2018-08-04 NOTE — Telephone Encounter (Signed)
Called pt re: lab results, left a message for her to call back  

## 2018-08-04 NOTE — Telephone Encounter (Signed)
-----   Message from Charlie Pitter, [place] sent at 08/03/2018  4:40 PM EST ----- Please let patient know potassium has come down some but still upper limits of normal. Kidney function looks better. Continue plan as discussed. I want her to follow her BP at home occasionally with our med changes (at least 2 hrs after when she takes her meds) and call if running >091 systolic or >06 diastolic with our med changes.  Dayna Dunn PA-C

## 2018-08-05 ENCOUNTER — Ambulatory Visit (HOSPITAL_COMMUNITY): Payer: Medicare HMO | Admitting: Anesthesiology

## 2018-08-05 ENCOUNTER — Other Ambulatory Visit: Payer: Self-pay

## 2018-08-05 ENCOUNTER — Ambulatory Visit (HOSPITAL_COMMUNITY)
Admission: RE | Admit: 2018-08-05 | Discharge: 2018-08-05 | Disposition: A | Discharge status: Home / Self Care | Payer: Medicare HMO | Attending: Cardiovascular Disease | Admitting: Cardiovascular Disease

## 2018-08-05 ENCOUNTER — Encounter (HOSPITAL_COMMUNITY): Payer: Self-pay | Admitting: *Deleted

## 2018-08-05 ENCOUNTER — Encounter (HOSPITAL_COMMUNITY)
Admission: RE | Disposition: A | Discharge status: Home / Self Care | Payer: Self-pay | Source: Home / Self Care | Attending: Cardiovascular Disease

## 2018-08-05 ENCOUNTER — Ambulatory Visit (HOSPITAL_BASED_OUTPATIENT_CLINIC_OR_DEPARTMENT_OTHER): Payer: Medicare HMO

## 2018-08-05 DIAGNOSIS — E7800 Pure hypercholesterolemia, unspecified: Secondary | ICD-10-CM | POA: Insufficient documentation

## 2018-08-05 DIAGNOSIS — I340 Nonrheumatic mitral (valve) insufficiency: Secondary | ICD-10-CM | POA: Insufficient documentation

## 2018-08-05 DIAGNOSIS — M797 Fibromyalgia: Secondary | ICD-10-CM | POA: Diagnosis not present

## 2018-08-05 DIAGNOSIS — I428 Other cardiomyopathies: Secondary | ICD-10-CM | POA: Diagnosis not present

## 2018-08-05 DIAGNOSIS — Z888 Allergy status to other drugs, medicaments and biological substances status: Secondary | ICD-10-CM | POA: Diagnosis not present

## 2018-08-05 DIAGNOSIS — F419 Anxiety disorder, unspecified: Secondary | ICD-10-CM | POA: Insufficient documentation

## 2018-08-05 DIAGNOSIS — F3181 Bipolar II disorder: Secondary | ICD-10-CM | POA: Diagnosis not present

## 2018-08-05 DIAGNOSIS — M1990 Unspecified osteoarthritis, unspecified site: Secondary | ICD-10-CM | POA: Insufficient documentation

## 2018-08-05 DIAGNOSIS — I471 Supraventricular tachycardia: Secondary | ICD-10-CM | POA: Diagnosis not present

## 2018-08-05 DIAGNOSIS — E785 Hyperlipidemia, unspecified: Secondary | ICD-10-CM | POA: Insufficient documentation

## 2018-08-05 DIAGNOSIS — K219 Gastro-esophageal reflux disease without esophagitis: Secondary | ICD-10-CM | POA: Insufficient documentation

## 2018-08-05 DIAGNOSIS — I5042 Chronic combined systolic (congestive) and diastolic (congestive) heart failure: Secondary | ICD-10-CM | POA: Insufficient documentation

## 2018-08-05 DIAGNOSIS — I110 Hypertensive heart disease with heart failure: Secondary | ICD-10-CM | POA: Diagnosis not present

## 2018-08-05 DIAGNOSIS — E119 Type 2 diabetes mellitus without complications: Secondary | ICD-10-CM | POA: Diagnosis not present

## 2018-08-05 DIAGNOSIS — Z886 Allergy status to analgesic agent status: Secondary | ICD-10-CM | POA: Diagnosis not present

## 2018-08-05 DIAGNOSIS — Z79899 Other long term (current) drug therapy: Secondary | ICD-10-CM | POA: Diagnosis not present

## 2018-08-05 DIAGNOSIS — Z794 Long term (current) use of insulin: Secondary | ICD-10-CM | POA: Insufficient documentation

## 2018-08-05 DIAGNOSIS — I5021 Acute systolic (congestive) heart failure: Secondary | ICD-10-CM | POA: Diagnosis not present

## 2018-08-05 DIAGNOSIS — R69 Illness, unspecified: Secondary | ICD-10-CM | POA: Diagnosis not present

## 2018-08-05 DIAGNOSIS — F17210 Nicotine dependence, cigarettes, uncomplicated: Secondary | ICD-10-CM | POA: Diagnosis not present

## 2018-08-05 HISTORY — PX: TEE WITHOUT CARDIOVERSION: SHX5443

## 2018-08-05 LAB — GLUCOSE, CAPILLARY
Glucose-Capillary: 67 mg/dL — ABNORMAL LOW (ref 70–99)
Glucose-Capillary: 113 mg/dL — ABNORMAL HIGH (ref 70–99)

## 2018-08-05 SURGERY — ECHOCARDIOGRAM, TRANSESOPHAGEAL
Anesthesia: Monitor Anesthesia Care

## 2018-08-05 MED ORDER — DEXTROSE 50 % IV SOLN
12.5000 g | INTRAVENOUS | Status: AC
  Administered 2018-08-05: 12.5 g via INTRAVENOUS

## 2018-08-05 MED ORDER — BUTAMBEN-TETRACAINE-BENZOCAINE 2-2-14 % EX AERO
INHALATION_SPRAY | CUTANEOUS | Status: DC | PRN
  Administered 2018-08-05: 2 via TOPICAL

## 2018-08-05 MED ORDER — DEXTROSE 50 % IV SOLN
INTRAVENOUS | Status: AC
  Filled 2018-08-05: qty 50

## 2018-08-05 MED ORDER — PROPOFOL 10 MG/ML IV BOLUS
INTRAVENOUS | Status: DC | PRN
  Administered 2018-08-05: 30 mg via INTRAVENOUS

## 2018-08-05 MED ORDER — LIDOCAINE 2% (20 MG/ML) 5 ML SYRINGE
INTRAMUSCULAR | Status: DC | PRN
  Administered 2018-08-05: 40 mg via INTRAVENOUS

## 2018-08-05 MED ORDER — LACTATED RINGERS IV SOLN
INTRAVENOUS | Status: DC | PRN
  Administered 2018-08-05: 08:00:00 via INTRAVENOUS

## 2018-08-05 MED ORDER — SODIUM CHLORIDE 0.9 % IV SOLN: INTRAVENOUS | Status: DC

## 2018-08-05 MED ORDER — PROPOFOL 500 MG/50ML IV EMUL
INTRAVENOUS | Status: DC | PRN
  Administered 2018-08-05: 100 ug/kg/min via INTRAVENOUS

## 2018-08-05 NOTE — Discharge Instructions (Signed)

## 2018-08-05 NOTE — Anesthesia Preprocedure Evaluation (Addendum)
Anesthesia Evaluation  Patient identified by MRN, date of birth, ID band Patient awake    Reviewed: Allergy & Precautions, NPO status , Patient's Chart, lab work & pertinent test results, reviewed documented beta blocker date and time   History of Anesthesia Complications Negative for: history of anesthetic complications  Airway Mallampati: I  TM Distance: >3 FB Neck ROM: Full    Dental  (+) Edentulous Upper, Missing, Dental Advisory Given   Pulmonary neg pulmonary ROS, Current Smoker,    Pulmonary exam normal        Cardiovascular hypertension, Pt. on home beta blockers +CHF and + DOE  Normal cardiovascular exam+ Valvular Problems/Murmurs MR and AI   IMPRESSIONS    1. The left ventricle has mildly reduced systolic function of 40-97%. The cavity size was normal. There is mildly increased left ventricular wall thickness. Echo evidence of pseudonormalization in diastolic relaxation.  2. The right ventricle has normal systolic function. The cavity was normal. There is no increase in right ventricular wall thickness.  3. Left atrial size was severely dilated.  4. Trivial pericardial effusion.  5. The anterior mitral valve leaflet appears partially restricted. There is mild thickening. Mitral valve regurgitation is moderate by color flow Doppler.  6. PISA measurements are consistent with mild MR. However, visually it is at least moderate. There appears to be mild restriction of the anterior mitral valve leaflet. This can be seen with ischemia or degenerative changes. Would consider cardiology  consultation and consider ischemia evaluation as well as TEE to better evaluate the mitral valve.  7. The tricuspid valve is normal in structure. Tricuspid valve regurgitation is moderate.  8. The aortic valve is normal in structure. There is mild thickening and mild calcification of the aortic valve. Aortic valve regurgitation is mild to moderate  by color flow Doppler.  9. The pulmonic valve was normal in structure.  1. Patent coronary arteries with no significant obstructive disease - mild luminal irregularities noted in the RCA and LAD 2. Low intracardiac filling pressures 3. Preserved cardiac output  Continue medical therapy. Plans noted for TEE to further assess mitral valve. Cath hemodynamics not suggestive of severe MR, but may be function component to MR and patient appears to be well-compensated at present.    Neuro/Psych PSYCHIATRIC DISORDERS Anxiety Depression Bipolar Disorder negative neurological ROS     GI/Hepatic Neg liver ROS, GERD  Medicated,  Endo/Other  negative endocrine ROSdiabetes  Renal/GU negative Renal ROS     Musculoskeletal  (+) Fibromyalgia -  Abdominal   Peds  Hematology negative hematology ROS (+)   Anesthesia Other Findings Day of surgery medications reviewed with the patient.  Reproductive/Obstetrics                            Anesthesia Physical Anesthesia Plan  ASA: III  Anesthesia Plan: MAC   Post-op Pain Management:    Induction:   PONV Risk Score and Plan: 2 and Ondansetron and Propofol infusion  Airway Management Planned: Natural Airway  Additional Equipment:   Intra-op Plan:   Post-operative Plan:   Informed Consent: I have reviewed the patients History and Physical, chart, labs and discussed the procedure including the risks, benefits and alternatives for the proposed anesthesia with the patient or authorized representative who has indicated his/her understanding and acceptance.     Dental advisory given  Plan Discussed with: CRNA and Anesthesiologist  Anesthesia Plan Comments:  Anesthesia Quick Evaluation  

## 2018-08-05 NOTE — Anesthesia Postprocedure Evaluation (Signed)
Anesthesia Post Note  Patient: [name]  Procedure(s) Performed: TRANSESOPHAGEAL ECHOCARDIOGRAM (TEE) (N/A )     Patient location during evaluation: PACU Anesthesia Type: MAC Level of consciousness: awake and alert Pain management: pain level controlled Vital Signs Assessment: post-procedure vital signs reviewed and stable Respiratory status: spontaneous breathing and respiratory function stable Cardiovascular status: stable Postop Assessment: no apparent nausea or vomiting Anesthetic complications: no    Last Vitals:  Vitals:   08/05/18 0925 08/05/18 0935  BP: 97/61 (!) 124/92  Pulse: 77 76  Resp: 17 16  Temp:    SpO2: 100% 100%    Last Pain:  Vitals:   08/05/18 0935  TempSrc:   PainSc: 0-No pain                 [name]

## 2018-08-05 NOTE — CV Procedure (Signed)
See full note in Syngo Propofol Anesthesia'  MR is only mild with restricted posterior leaflet motion  Jenkins Rouge

## 2018-08-05 NOTE — Interval H&P Note (Signed)
History and Physical Interval Note:  08/05/2018 7:43 AM  [name]  has presented today for surgery, with the diagnosis of MR  The various methods of treatment have been discussed with the patient and family. After consideration of risks, benefits and other options for treatment, the patient has consented to  Procedure(s): TRANSESOPHAGEAL ECHOCARDIOGRAM (TEE) (N/A) as a surgical intervention .  The patient's history has been reviewed, patient examined, no change in status, stable for surgery.  I have reviewed the patient's chart and labs.  Questions were answered to the patient's satisfaction.     Jenkins Rouge

## 2018-08-05 NOTE — Progress Notes (Signed)
  Echocardiogram Echocardiogram Transesophageal has been performed.  [name] 08/05/2018, 9:15 AM

## 2018-08-05 NOTE — Transfer of Care (Signed)
Immediate Anesthesia Transfer of Care Note  Patient: [name]  Procedure(s) Performed: TRANSESOPHAGEAL ECHOCARDIOGRAM (TEE) (N/A )  Patient Location: Endoscopy Unit  Anesthesia Type:MAC  Level of Consciousness: drowsy and patient cooperative  Airway & Oxygen Therapy: Patient Spontanous Breathing and Patient connected to nasal cannula oxygen  Post-op Assessment: Report given to RN and Post -op Vital signs reviewed and stable  Post vital signs: Reviewed and stable  Last Vitals:  Vitals Value Taken Time  BP    Temp 36.7 C 08/05/2018  9:13 AM  Pulse    Resp    SpO2 100     Last Pain:  Vitals:   08/05/18 0758  TempSrc: Oral  PainSc: 0-No pain         Complications: No apparent anesthesia complications

## 2018-08-06 ENCOUNTER — Encounter (HOSPITAL_COMMUNITY): Payer: Self-pay | Admitting: Cardiovascular Disease

## 2018-08-10 ENCOUNTER — Encounter: Payer: Self-pay | Admitting: Physician Assistant

## 2018-08-10 ENCOUNTER — Ambulatory Visit (INDEPENDENT_AMBULATORY_CARE_PROVIDER_SITE_OTHER): Payer: Medicare HMO | Admitting: Physician Assistant

## 2018-08-10 VITALS — BP 138/78 | HR 88 | Ht 62.0 in | Wt 135.8 lb

## 2018-08-10 DIAGNOSIS — I471 Supraventricular tachycardia: Secondary | ICD-10-CM | POA: Diagnosis not present

## 2018-08-10 DIAGNOSIS — I5042 Chronic combined systolic (congestive) and diastolic (congestive) heart failure: Secondary | ICD-10-CM

## 2018-08-10 DIAGNOSIS — I340 Nonrheumatic mitral (valve) insufficiency: Secondary | ICD-10-CM | POA: Diagnosis not present

## 2018-08-10 DIAGNOSIS — I351 Nonrheumatic aortic (valve) insufficiency: Secondary | ICD-10-CM | POA: Diagnosis not present

## 2018-08-10 DIAGNOSIS — I10 Essential (primary) hypertension: Secondary | ICD-10-CM | POA: Diagnosis not present

## 2018-08-10 DIAGNOSIS — I071 Rheumatic tricuspid insufficiency: Secondary | ICD-10-CM

## 2018-08-10 DIAGNOSIS — N179 Acute kidney failure, unspecified: Secondary | ICD-10-CM

## 2018-08-10 LAB — BASIC METABOLIC PANEL
BUN/Creatinine Ratio: 21 (ref 12–28)
BUN: 21 mg/dL (ref 8–27)
CO2: 26 mmol/L (ref 20–29)
Calcium: 10.4 mg/dL — ABNORMAL HIGH (ref 8.7–10.3)
Chloride: 102 mmol/L (ref 96–106)
Creatinine, Ser: 0.99 mg/dL (ref 0.57–1.00)
GFR, EST AFRICAN AMERICAN: 70 mL/min/{1.73_m2} (ref >59)
GFR, EST NON AFRICAN AMERICAN: 61 mL/min/{1.73_m2} (ref >59)
Glucose: 137 mg/dL — ABNORMAL HIGH (ref 65–99)
Potassium: 5.2 mmol/L (ref 3.5–5.2)
Sodium: 143 mmol/L (ref 134–144)

## 2018-08-10 NOTE — Progress Notes (Signed)
Cardiology Office Note    Date:  08/10/2018   ID:  [name], DOB 16-Sep-1955, MRN [id number]  PCP:  [place] Bing, DO  Cardiologist:  Dr. Radford Pax   Chief Complaint: 1 week follow up   History of Present Illness:   [name] is a 63 y.o. female female with history of DM, dyslipidemia (followed by PCP), HTN, GERD, tobacco abuse, bipolar disorder, THC use,  combined CHF/NICM, mitral regurgitation, tricuspid regurgitation and aortic regurgitation presents for follow up.   She was remotely followed by Dr. Radford Pax for atypical CP and diastolic dysfunction with normal EF in 2015. She was recently admitted 07/22/18 with progressive worsening dyspnea, orthopnea and lower extremity edema. CT angio showed no PE but did show aortic atheroscerlosis and cardiac enlargement with bilateral pulmonary effusions, atx and likely pulmonary edema. She was found to have new onset CHF with echo showing EF 45-50%, moderate DD, moderate MR, moderate TR, mild-mod AR. She ruled out for MI and was diuresed. Cardiac cath 07/26/18 showed no significant CAD - mild luminal irregularities of the RCa/LAD, low intracardiac filling pressures and preserved cardiac output. Cath hemodynamics were not suggestive of severe MR. Telemetry did show SVT felt to possibly represent nonsustained atrial tachycardia and outpatient event monitor was recommended.   Seen in clinic 08/03/18. Increase metoprolol for palpitations. Stropped Candesartan.  TEE 2/20 showed LV function of 40 to 45%, normal right ventricular function, rheumatic mitral valve with mild regurgitation.  No mitral stenosis.  Moderate TR.  Mild calcification of aortic valve.  Here today for follow up. Denies chest pain, palpitations, LE edema, orthopnea, PND, syncope or melena. Eats low sodium diet. Compliant with medications. Says not smoking tobacco or marijuana.   Past Medical History:  Diagnosis Date  . Allergy   . Anxiety   . Aortic insufficiency    a.  mild-mod by echo 07/2018.  [name] Arthritis   . Bipolar 2 disorder ([place])   . Cataract   . Chest pain   . Chronic combined systolic and diastolic CHF (congestive heart failure) ([place])   . Depression   . DM (diabetes mellitus), type 2 ([place])   . GERD (gastroesophageal reflux disease)   . HTN (hypertension)   . Hypercholesteremia   . Marijuana abuse   . Moderate mitral regurgitation   . Moderate tricuspid regurgitation   . NICM (nonischemic cardiomyopathy) ([place])   . Renal insufficiency   . Trigger finger    r index inj 12/08, bilateral thumb inj 11/09, r thumb and dequervain's inj 01/2009    Past Surgical History:  Procedure Laterality Date  . ABDOMINAL HYSTERECTOMY  1986   fibroids  . CARDIAC CATHETERIZATION  05/06/2004   normal LV fxn EF>60%  . CYSTECTOMY     back of head  . LEFT HEART CATH AND CORONARY ANGIOGRAPHY N/A 07/26/2018   Procedure: LEFT HEART CATH AND CORONARY ANGIOGRAPHY;  Surgeon: Sherren Mocha, MD;  Location: [facility];  Service: Cardiovascular;  Laterality: N/A;  . LEFT HEART CATHETERIZATION WITH CORONARY ANGIOGRAM N/A 05/26/2011   Procedure: LEFT HEART CATHETERIZATION WITH CORONARY ANGIOGRAM;  Surgeon: Larey Dresser, MD;  Location: [facility] CATH LAB;  Service: Cardiovascular;  Laterality: N/A;  radial  . perirpheral iridectomy  02/12/2005  . TEE WITHOUT CARDIOVERSION N/A 08/05/2018   Procedure: TRANSESOPHAGEAL ECHOCARDIOGRAM (TEE);  Surgeon: Josue Hector, MD;  Location: [facility] ENDOSCOPY;  Service: Cardiovascular;  Laterality: N/A;    Current Medications: Prior to Admission medications   Medication Sig Start  Date End Date Taking? Authorizing Provider  acetaminophen (TYLENOL) 500 MG tablet Take 1,000 mg by mouth every 6 (six) hours as needed for mild pain.    [provider]  atorvastatin (LIPITOR) 80 MG tablet Take 1 tablet (80 mg total) by mouth every morning. 07/27/18   Benay Pike, MD  Blood Glucose Monitoring Suppl (ONE TOUCH ULTRA 2) W/DEVICE KIT 1  kit by Does not apply route once. 04/11/15   Karamalegos, Devonne Doughty, DO  feeding supplement, ENSURE ENLIVE, (ENSURE ENLIVE) LIQD Take 237 mLs by mouth 3 (three) times daily between meals. 07/27/18   Benay Pike, MD  furosemide (LASIX) 20 MG tablet Take 1 tablet mondays, wednesdays, & fridays 08/03/18   Dunn, Lisbeth Renshaw N, PA-C  glucose blood (ONE TOUCH ULTRA TEST) test strip Test 3 times a day. 05/22/17   [place] Bing, DO  insulin aspart (NOVOLOG) 100 UNIT/ML injection Inject 4-8 Units into the skin 2 (two) times daily with a meal. 09/10/17   Hensel, Jamal Collin, MD  insulin degludec (TRESIBA FLEXTOUCH) 100 UNIT/ML SOPN FlexTouch Pen Inject 0.26 mLs (26 Units total) into the skin daily. 09/10/17   Zenia Resides, MD  Lancets [facility] ULTRASOFT) lancets Use as instructed 04/11/15   Olin Hauser, DO  metoprolol succinate (TOPROL XL) 25 MG 24 hr tablet Take 4 tablets (100 mg total) by mouth daily. 08/03/18   Dunn, Nedra Hai, PA-C  mirtazapine (REMERON) 15 MG tablet Take 1 tablet (15 mg total) by mouth at bedtime. 07/30/18   Lockamy, Christia Reading, DO  nicotine (NICODERM CQ - DOSED IN MG/24 HOURS) 21 mg/24hr patch Place 1 patch (21 mg total) onto the skin daily. 07/30/18   Nuala Alpha, DO  ONE TOUCH LANCETS MISC 1 Device by Does not apply route 3 (three) times daily. 09/22/13   Melony Overly, MD  ranitidine (ZANTAC) 150 MG tablet Take 300 mg by mouth daily.    [provider]    Allergies:   Ibuprofen and Lisinopril   Social History   Socioeconomic History  . Marital status: Married    Spouse name: Not on file  . Number of children: Not on file  . Years of education: Not on file  . Highest education level: Not on file  Occupational History  . 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 Financial resource strain: Not on file  . Food insecurity:    Worry: Not on file    Inability: Not on file  .  Transportation needs:    Medical: Not on file    Non-medical: Not on file  Tobacco Use  . Smoking status: Current Every Day Smoker    Packs/day: 0.50    Years: 43.00    Pack years: 21.50    Types: Cigarettes  . Smokeless tobacco: Never Used  Substance and Sexual Activity  . Alcohol use: No    Alcohol/week: 0.0 standard drinks  . Drug use: No    Frequency: 7.0 times per week    Types: Marijuana    Comment: not any longer pt denies  . Sexual activity: Never    Partners: Male  Lifestyle  . Physical activity:    Days per week: Not on file    Minutes per session: Not on file  . Stress: Not on file  Relationships  . Social connections:    Talks on phone: Not on file  Gets together: Not on file    Attends religious service: Not on file    Active member of club or organization: Not on file    Attends meetings of clubs or organizations: Not on file    Relationship status: Not on file  Other Topics Concern  . Not on file  Social History Narrative   Finances are a major issue. Meeting with [employer]. Rejected by healthcare sharing initiative   currently receiving med assistance for lantus through our office and lyrica.      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 Pt in process of obtaining disability.      Family History:  The patient's family history includes Asthma in her son and son; Bipolar disorder in her brother, mother, and sister; Diabetes in her brother, brother, brother, brother, father, mother, sister, sister, sister, sister, sister, and sister; Heart attack in her mother; Heart disease in her mother; Hypertension in her sister.   ROS:   Please see the history of present illness.    ROS All other systems reviewed and are negative.   PHYSICAL EXAM:   VS:  BP 138/78   Pulse 88   Ht 5' 2"  (1.575 m)   Wt 135 lb 12.8 oz (61.6 kg)   SpO2 98%    BMI 24.84 kg/m    GEN: Well nourished, well developed, in no acute distress  HEENT: normal  Neck: no JVD, carotid bruits, or masses Cardiac: RRR; 2/6 systolic  murmurs, rubs, or gallops,no edema  Respiratory:  clear to auscultation bilaterally, normal work of breathing GI: soft, nontender, nondistended, + BS MS: no deformity or atrophy  Skin: warm and dry, no rash Neuro:  Alert and Oriented x 3, Strength and sensation are intact Psych: euthymic mood, full affect  Wt Readings from Last 3 Encounters:  08/10/18 135 lb 12.8 oz (61.6 kg)  08/03/18 132 lb 12.8 oz (60.2 kg)  07/30/18 130 lb 12.8 oz (59.3 kg)      Studies/Labs Reviewed:   EKG:  EKG is not ordered today.   Recent Labs: 07/22/2018: B Natriuretic Peptide 985.8 07/23/2018: Magnesium 1.9; TSH 2.620 07/24/2018: Platelets 281 07/26/2018: Hemoglobin 13.6 07/27/2018: ALT 19 08/03/2018: BUN 18; Creatinine, Ser 0.97; Potassium 5.1; Sodium 139   Lipid Panel    Component Value Date/Time   CHOL 166 07/23/2018 1122   TRIG 91 07/23/2018 1122   HDL 48 07/23/2018 1122   CHOLHDL 3.5 07/23/2018 1122   VLDL 18 07/23/2018 1122   LDLCALC 100 (H) 07/23/2018 1122   LDLDIRECT 237 (H) 06/25/2017 1337   LDLDIRECT 101 (H) 05/31/2013 1421    Additional studies/ records that were reviewed today include:   TEE 08/05/18  1. The left ventricle has mild-moderately reduced systolic function, with an ejection fraction of 40-45%. The cavity size was normal. There is mildly increased left ventricular wall thickness. Left ventricular diastology could not be evaluated Left  ventrical global hypokinesis without regional wall motion abnormalities.  2. The right ventricle has normal systolc function. The cavity was normal. There is no increase in right ventricular wall thickness.  3. Left atrial size was mildly dilated.  4. The mitral valve is rheumatic. Moderate thickening of the mitral valve leaflet. Moderate calcification of the mitral valve leaflet. No  evidence of mitral valve stenosis.  5. Thickened leaflets with restricted posterior motion [place] 3B. Mild MR.  6. Tricuspid valve  regurgitation is moderate.  7. The aortic valve is tricuspid Mild thickening of the aortic valve Mild calcification of the aortic valve. Aortic valve regurgitation is trivial by color flow Doppler.  8. The pulmonic valve was grossly normal. Pulmonic valve regurgitation is mild by color flow Doppler.  9. The aortic root is normal in size and structure.   LEFT HEART CATH AND CORONARY ANGIOGRAPHY  07/26/18  Conclusion   1. Patent coronary arteries with no significant obstructive disease - mild luminal irregularities noted in the RCA and LAD 2. Low intracardiac filling pressures 3. Preserved cardiac output  Continue medical therapy. Plans noted for TEE to further assess mitral valve. Cath hemodynamics not suggestive of severe MR, but may be function component to MR and patient appears to be well-compensated at present.     ASSESSMENT & PLAN:    1.  Chronic combined CHF/nonischemic cardiomyopathy -Euvolemic.  TEE as above.  Continue Lasix Monday Wednesday Friday.  Continue Toprol-XL 100 mg daily.  Check BMET today, if stable renal function, will restart Candesartan.   2. SVT/palpitations - Improved since increased Toprol XL to total 176m daily. Monitor as schedule.   3. Valvular heart disease - TEE 2/20 showed LV function of 40 to 45%, normal right ventricular function, rheumatic mitral valve with mild regurgitation.  No mitral stenosis.  Moderate TR.  Mild calcification of aortic valve. - Follow clinically    Medication Adjustments/Labs and Tests Ordered: Current medicines are reviewed at length with the patient today.  Concerns regarding medicines are outlined above.  Medication changes, Labs and Tests ordered today are listed in the Patient Instructions below. Patient Instructions  Medication Instructions:  Your physician recommends that you  continue on your current medications as directed. Please refer to the Current Medication list given to you today.  If you need a refill on your cardiac medications before your next appointment, please call your pharmacy.   Lab work: TODAY:  BMET  If you have labs (blood work) drawn today and your tests are completely normal, you will receive your results only by: .Marland KitchenMyChart Message (if you have MyChart) OR . A paper copy in the mail If you have any lab test that is abnormal or we need to change your treatment, we will call you to review the results.  Testing/Procedures: None ordered  Follow-Up: Your physician recommends that you schedule a follow-up appointment in: 3 MONTHS WITH DR. TRadford Pax Any Other Special Instructions Will Be Listed Below (If Applicable).       SJarrett Soho PUtah 08/10/2018 11:25 AM    [facility] [address] [place]  [id number]: [phone] Fax: [phone]

## 2018-08-10 NOTE — Patient Instructions (Addendum)
Medication Instructions:  Your physician recommends that you continue on your current medications as directed. Please refer to the Current Medication list given to you today.  If you need a refill on your cardiac medications before your next appointment, please call your pharmacy.   Lab work: TODAY:  BMET  If you have labs (blood work) drawn today and your tests are completely normal, you will receive your results only by: [name] MyChart Message (if you have MyChart) OR . A paper copy in the mail If you have any lab test that is abnormal or we need to change your treatment, we will call you to review the results.  Testing/Procedures: None ordered  Follow-Up: Your physician recommends that you schedule a follow-up appointment in: 3 MONTHS WITH DR. Radford Pax  Any Other Special Instructions Will Be Listed Below (If Applicable).

## 2018-08-11 ENCOUNTER — Ambulatory Visit: Payer: Medicare HMO | Admitting: Family Medicine

## 2018-08-11 ENCOUNTER — Ambulatory Visit (INDEPENDENT_AMBULATORY_CARE_PROVIDER_SITE_OTHER): Payer: Medicare HMO | Admitting: Family Medicine

## 2018-08-11 VITALS — BP 145/70 | HR 97 | Temp 98.5°F | Wt 135.4 lb

## 2018-08-11 DIAGNOSIS — I5021 Acute systolic (congestive) heart failure: Secondary | ICD-10-CM | POA: Diagnosis not present

## 2018-08-11 DIAGNOSIS — E875 Hyperkalemia: Secondary | ICD-10-CM | POA: Diagnosis not present

## 2018-08-11 NOTE — Progress Notes (Signed)
   Subjective   Patient ID: [name]    DOB: 1955/08/21, 63 y.o. female   MRN: [id number]  CC: "Hospital follow-up"  HPI: [name] is a 63 y.o. 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  She was found to have signs of heart failure based on elevated BNP of 985, negative troponins, and a CXR significant for pulmonary vascular congestion with interstitial edema.  She also was found to have small bilateral pleural effusions.  CTA ruled out PE.  EF on echo revealed 45-50% with increased LV wall thickness and grade 2 diastolic dysfunction with moderate TR and AR.  She also underwent coronary cath with patent arteries without significant obstructive disease.  She was started on metoprolol succinate increased to 75 mg daily and Lasix 20 mg daily.  He has since followed up with cardiology yesterday for follow-up.  Her metoprolol was increased for palpitations and her candesartan was stopped.  She did undergo a TEE on 08/05/2018 revealing LV function of 40-45% and normal RV function, rheumatic mitral valve with mild regurgitation without stenosis and a moderate TR and mild calcification of the aortic valve.  She was found to be euvolemic and instructed to continue Lasix on Monday/Wednesday/Friday and continue Toprol-XL at 100 mg daily.  Decision was made to restart candesartan yesterday.  She is instructed to follow-up with Dr. Radford Pax in 3 months.  Since her office visit yesterday, patient reports being called and informed to hold off on the candesartan due to elevated potassium levels.  She is currently adherent to her Lasix and Toprol-XL.  She denies any chest pain, shortness of breath, lower extremity swelling.  ROS: see HPI for pertinent.  [place]: HFrEF with MR, DM with neuropathy and gastroparesis, HTN, HFpEF, COPD emphysema,  tobacco use disorder, MDD, bipolar, fibromyalgia, cataracts.Surgical history TAH, LHC, iridectomy. Family history heart disease, DM, bipolar.Smoking status reviewed. Medications reviewed.  Objective   BP (!) 145/70   Pulse 97   Temp 98.5 F (36.9 C) (Oral)   Wt 135 lb 6.4 oz (61.4 kg)   SpO2 99%   BMI 24.76 kg/m  Vitals and nursing note reviewed.  General: well nourished, well developed, NAD with non-toxic appearance HEENT: normocephalic, atraumatic, moist mucous membranes Cardiovascular: RRR, systolic murmur, no rubs or gallops Lungs: clear to auscultation bilaterally with normal work of breathing Skin: warm, dry, no rashes or lesions, cap refill < 2 seconds Extremities: warm and well perfused, normal tone, no edema  Assessment & Plan   Acute systolic heart failure (HCC) Recent diagnosis.  HFrEF secondary to nonischemic cardiomyopathy with known MR.  Remains asymptomatic. - Plan to continue Toprol-XL 1 mg daily and Lasix 20 mg M/W/F - See plan for hyperkalemia - Follow-up with cardiology in 3 months - Reviewed return precautions, RTC in 1 month  Hyperkalemia Recently on the high end of normal.  Considering restarting ARB given HFrEF.  No signs of kidney disease. - Rechecking BMET today and will follow-up with restarting candesartan if appropriate  Orders Placed This Encounter  Procedures  . Basic Metabolic Panel   No orders of the defined types were placed in this encounter.   Harriet Butte, [place], PGY-3 08/11/2018, 12:14 PM

## 2018-08-11 NOTE — Assessment & Plan Note (Signed)
Recently on the high end of normal.  Considering restarting ARB given HFrEF.  No signs of kidney disease. - Rechecking BMET today and will follow-up with restarting candesartan if appropriate

## 2018-08-11 NOTE — Patient Instructions (Signed)
Thank you for coming in to see [place] today. Please see below to review our plan for today's visit.  1.  Continue the Lasix 20 mg every Monday, Wednesday, and Friday along with the daily use of Toprol-XL 100 mg daily. 2.  Hold off on restarting your candesartan until I call you regarding your potassium levels. 3.  Follow-up with cardiology in 3 months.  We will see you again in about 1 month.  Please call the clinic at [phone] if your symptoms worsen or you have any concerns. It was our pleasure to serve you.  Harriet Butte, [place], PGY-3

## 2018-08-11 NOTE — Assessment & Plan Note (Signed)
Recent diagnosis.  HFrEF secondary to nonischemic cardiomyopathy with known MR.  Remains asymptomatic. - Plan to continue Toprol-XL 1 mg daily and Lasix 20 mg M/W/F - See plan for hyperkalemia - Follow-up with cardiology in 3 months - Reviewed return precautions, RTC in 1 month

## 2018-08-12 ENCOUNTER — Telehealth: Payer: Self-pay | Admitting: Family Medicine

## 2018-08-12 LAB — BASIC METABOLIC PANEL
BUN/Creatinine Ratio: 18 (ref 12–28)
BUN: 16 mg/dL (ref 8–27)
CO2: 25 mmol/L (ref 20–29)
Calcium: 9.9 mg/dL (ref 8.7–10.3)
Chloride: 103 mmol/L (ref 96–106)
Creatinine, Ser: 0.89 mg/dL (ref 0.57–1.00)
GFR calc Af Amer: 80 mL/min/{1.73_m2} (ref >59)
GFR calc non Af Amer: 69 mL/min/{1.73_m2} (ref >59)
Glucose: 288 mg/dL — ABNORMAL HIGH (ref 65–99)
Potassium: 4.8 mmol/L (ref 3.5–5.2)
Sodium: 144 mmol/L (ref 134–144)

## 2018-08-12 NOTE — Telephone Encounter (Signed)
Contacted patient regarding potassium check on 08/13/2018.  Appears to be improved at 4.8 which is around patient's baseline.  Advised to trial candesartan and follow-up with scheduled appointment on 08/17/2018 at which point we will recheck.  Patient in agreement with plan and will follow-up accordingly.  Harriet Butte, [place], PGY-3

## 2018-08-13 ENCOUNTER — Other Ambulatory Visit: Payer: Medicare HMO

## 2018-08-13 ENCOUNTER — Ambulatory Visit (INDEPENDENT_AMBULATORY_CARE_PROVIDER_SITE_OTHER): Payer: Medicare HMO

## 2018-08-13 DIAGNOSIS — I5042 Chronic combined systolic (congestive) and diastolic (congestive) heart failure: Secondary | ICD-10-CM | POA: Diagnosis not present

## 2018-08-13 DIAGNOSIS — I471 Supraventricular tachycardia: Secondary | ICD-10-CM

## 2018-08-13 DIAGNOSIS — I340 Nonrheumatic mitral (valve) insufficiency: Secondary | ICD-10-CM

## 2018-08-13 DIAGNOSIS — N179 Acute kidney failure, unspecified: Secondary | ICD-10-CM | POA: Diagnosis not present

## 2018-08-13 DIAGNOSIS — I351 Nonrheumatic aortic (valve) insufficiency: Secondary | ICD-10-CM

## 2018-08-13 DIAGNOSIS — I071 Rheumatic tricuspid insufficiency: Secondary | ICD-10-CM | POA: Diagnosis not present

## 2018-08-24 ENCOUNTER — Telehealth: Payer: Self-pay | Admitting: Cardiology

## 2018-08-24 DIAGNOSIS — I471 Supraventricular tachycardia: Secondary | ICD-10-CM

## 2018-08-24 NOTE — Telephone Encounter (Signed)
Printed off the pts event monitor recording, and noted that at 12:53 pm CT on today 08/24/18, the pt was in SVT for 1 min, with a HR of 163 bpm.   Showed this event to Dr Radford Pax, and she recommends that the pt be referred to EP for further evaluation of SVT.  Order was placed in the system.  Will also send a staff message to [place], to call the pt back and arrange new pt consult appt with EP, for SVT noted on event monitor. Did try reaching out to the pt again x 3 and she did not answer, so I did leave her a detailed message on her confirmed VM to call the office back and ask for a triage nurse, or if after 5 pm, ask for the on-call.  Also made Dr Theodosia Blender covering RN aware of this as well, so that he can follow-up on this tomorrow.  Again, I will go ahead and place the referral to EP in the system, and we will wait for the pt to call back, to endorse recommendations per Dr Radford Pax.

## 2018-08-24 NOTE — Telephone Encounter (Signed)
[employer] called stating that the pts monitor recording from today at 12:52 CT showed that she went into SVT.  [facility] states they tried calling the pt, however she did not answer.  They will be faxing the report now.  I also tried calling the pt back at the only contact number provided, and she did not answer.  Did leave the pt a detailed message to call the office back and ask to speak with a triage nurse, or if after 5pm, she can call the office back and hit the prompt to speak with the on-call Provider, to obtain more details regarding recorded event.

## 2018-08-24 NOTE — Telephone Encounter (Signed)
° ° °  [employer] calling to report EKG result

## 2018-08-25 ENCOUNTER — Telehealth: Payer: Self-pay

## 2018-08-25 DIAGNOSIS — G4700 Insomnia, unspecified: Secondary | ICD-10-CM | POA: Diagnosis not present

## 2018-08-25 DIAGNOSIS — I509 Heart failure, unspecified: Secondary | ICD-10-CM | POA: Diagnosis not present

## 2018-08-25 DIAGNOSIS — E119 Type 2 diabetes mellitus without complications: Secondary | ICD-10-CM | POA: Diagnosis not present

## 2018-08-25 DIAGNOSIS — R69 Illness, unspecified: Secondary | ICD-10-CM | POA: Diagnosis not present

## 2018-08-25 DIAGNOSIS — I110 Hypertensive heart disease with heart failure: Secondary | ICD-10-CM | POA: Diagnosis not present

## 2018-08-25 DIAGNOSIS — Z7722 Contact with and (suspected) exposure to environmental tobacco smoke (acute) (chronic): Secondary | ICD-10-CM | POA: Diagnosis not present

## 2018-08-25 DIAGNOSIS — K219 Gastro-esophageal reflux disease without esophagitis: Secondary | ICD-10-CM | POA: Diagnosis not present

## 2018-08-25 DIAGNOSIS — E785 Hyperlipidemia, unspecified: Secondary | ICD-10-CM | POA: Diagnosis not present

## 2018-08-25 DIAGNOSIS — Z794 Long term (current) use of insulin: Secondary | ICD-10-CM | POA: Diagnosis not present

## 2018-08-25 NOTE — Telephone Encounter (Signed)
Patient had several monitor reports after the one yesterday at 1:00 pm. Patient had similar reports on 08/24/18 1:14 pm and 2:35 pm. Patient has referral to EP. Dr. Radford Pax reviewed monitor reports, no changes at this time.

## 2018-08-25 NOTE — Telephone Encounter (Signed)
Spoke with pt regarding run of SVT on 3/10 and Dr. Landis Gandy recommedation of referral to EP. Pt reported she was not engaging in any physical activity yesterday but did remember feeling week around the time of the episode. Advised pt to call back if any other symptoms appear. Informed pt she would be getting a call to schedule appt with EP in the near future. Pt verbalized understanding.

## 2018-09-01 ENCOUNTER — Other Ambulatory Visit: Payer: Self-pay

## 2018-09-01 MED ORDER — FUROSEMIDE 20 MG PO TABS: ORAL_TABLET | ORAL | 3 refills | Status: DC

## 2018-09-01 NOTE — Telephone Encounter (Signed)
Pt called nurse line requesting a refill on her lasix. This medication was prescribed to her while she was in the hospital. Will forward to pcp to advise.

## 2018-09-02 ENCOUNTER — Encounter: Payer: Self-pay | Admitting: Cardiology

## 2018-09-09 ENCOUNTER — Telehealth: Payer: Self-pay | Admitting: *Deleted

## 2018-09-09 NOTE — Telephone Encounter (Signed)
Called pt to discuss current situation. Pt aware of current pandemic and reasoning for not having her come into the office at this time. Pt reports feeling well, occasional palpitations.  Reports feeling them most when she lays down to go to bed. Pt aware office will call back to reschedule OV when safe to do so. Advised to call office back if she begins to experience more frequent episodes and/or symptoms occur.   Pt agreeable to plan.

## 2018-09-10 ENCOUNTER — Institutional Professional Consult (permissible substitution): Payer: Medicare HMO | Admitting: Cardiology

## 2018-09-11 ENCOUNTER — Other Ambulatory Visit: Payer: Self-pay

## 2018-09-11 ENCOUNTER — Emergency Department (HOSPITAL_COMMUNITY): Payer: Medicare HMO

## 2018-09-11 ENCOUNTER — Emergency Department (HOSPITAL_COMMUNITY)
Admission: EM | Admit: 2018-09-11 | Discharge: 2018-09-11 | Disposition: A | Discharge status: Home / Self Care | Payer: Medicare HMO | Attending: Emergency Medicine | Admitting: Emergency Medicine

## 2018-09-11 ENCOUNTER — Encounter (HOSPITAL_COMMUNITY): Payer: Self-pay

## 2018-09-11 DIAGNOSIS — J449 Chronic obstructive pulmonary disease, unspecified: Secondary | ICD-10-CM | POA: Diagnosis not present

## 2018-09-11 DIAGNOSIS — Z87891 Personal history of nicotine dependence: Secondary | ICD-10-CM | POA: Insufficient documentation

## 2018-09-11 DIAGNOSIS — M7989 Other specified soft tissue disorders: Secondary | ICD-10-CM | POA: Diagnosis not present

## 2018-09-11 DIAGNOSIS — I509 Heart failure, unspecified: Secondary | ICD-10-CM | POA: Diagnosis not present

## 2018-09-11 DIAGNOSIS — Z79899 Other long term (current) drug therapy: Secondary | ICD-10-CM | POA: Diagnosis not present

## 2018-09-11 DIAGNOSIS — R0602 Shortness of breath: Secondary | ICD-10-CM | POA: Diagnosis not present

## 2018-09-11 DIAGNOSIS — I5023 Acute on chronic systolic (congestive) heart failure: Secondary | ICD-10-CM | POA: Insufficient documentation

## 2018-09-11 DIAGNOSIS — I110 Hypertensive heart disease with heart failure: Secondary | ICD-10-CM | POA: Insufficient documentation

## 2018-09-11 LAB — BASIC METABOLIC PANEL
Anion gap: 11 (ref 5–15)
BUN: 10 mg/dL (ref 8–23)
CHLORIDE: 103 mmol/L (ref 98–111)
CO2: 21 mmol/L — AB (ref 22–32)
Calcium: 9.3 mg/dL (ref 8.9–10.3)
Creatinine, Ser: 0.67 mg/dL (ref 0.44–1.00)
GFR calc Af Amer: >60 mL/min (ref >60)
GFR calc non Af Amer: >60 mL/min (ref >60)
Glucose, Bld: 269 mg/dL — ABNORMAL HIGH (ref 70–99)
Potassium: 3.6 mmol/L (ref 3.5–5.1)
SODIUM: 135 mmol/L (ref 135–145)

## 2018-09-11 LAB — CBC WITH DIFFERENTIAL/PLATELET
Abs Immature Granulocytes: 0.02 10*3/uL (ref 0.00–0.07)
BASOS ABS: 0 10*3/uL (ref 0.0–0.1)
Basophils Relative: 1 %
Eosinophils Absolute: 0.1 10*3/uL (ref 0.0–0.5)
Eosinophils Relative: 2 %
HCT: 35.4 % — ABNORMAL LOW (ref 36.0–46.0)
Hemoglobin: 11.5 g/dL — ABNORMAL LOW (ref 12.0–15.0)
Immature Granulocytes: 0 %
LYMPHS ABS: 3.1 10*3/uL (ref 0.7–4.0)
Lymphocytes Relative: 49 %
MCH: 30.4 pg (ref 26.0–34.0)
MCHC: 32.5 g/dL (ref 30.0–36.0)
MCV: 93.7 fL (ref 80.0–100.0)
Monocytes Absolute: 0.5 10*3/uL (ref 0.1–1.0)
Monocytes Relative: 8 %
NRBC: 0 % (ref 0.0–0.2)
Neutro Abs: 2.6 10*3/uL (ref 1.7–7.7)
Neutrophils Relative %: 40 %
Platelets: 277 10*3/uL (ref 150–400)
RBC: 3.78 MIL/uL — ABNORMAL LOW (ref 3.87–5.11)
RDW: 13.7 % (ref 11.5–15.5)
WBC: 6.3 10*3/uL (ref 4.0–10.5)

## 2018-09-11 LAB — TROPONIN I
Troponin I: <0.03 ng/mL (ref <0.03)
Troponin I: <0.03 ng/mL (ref <0.03)

## 2018-09-11 LAB — BRAIN NATRIURETIC PEPTIDE: B Natriuretic Peptide: 773 pg/mL — ABNORMAL HIGH (ref 0.0–100.0)

## 2018-09-11 MED ORDER — METOPROLOL SUCCINATE ER 100 MG PO TB24
100.0000 mg | ORAL_TABLET | Freq: Every day | ORAL | Status: DC
  Administered 2018-09-11: 100 mg via ORAL
  Filled 2018-09-11: qty 1

## 2018-09-11 MED ORDER — FUROSEMIDE 10 MG/ML IJ SOLN
40.0000 mg | Freq: Once | INTRAMUSCULAR | Status: AC
  Administered 2018-09-11: 40 mg via INTRAVENOUS
  Filled 2018-09-11: qty 4

## 2018-09-11 MED ORDER — FUROSEMIDE 20 MG PO TABS: 20.0000 mg | ORAL_TABLET | Freq: Every day | ORAL | 0 refills | Status: DC

## 2018-09-11 NOTE — Discharge Instructions (Signed)
I am providing you with a refill of your Lasix.  It is important that you take this and follow-up with your cardiologist and primary care provider for further evaluation and changes to your medications. Return to the ED if you start to have worsening symptoms, chest pain, increased shortness of breath or leg swelling, coughing up blood.

## 2018-09-11 NOTE — ED Notes (Signed)
Patient transported to CT 

## 2018-09-11 NOTE — ED Notes (Signed)
Phlebotomist at pt bedside.

## 2018-09-11 NOTE — ED Notes (Signed)
Pt ambulated in hall with EMT; pulse ox maintained at 99-100% during ambulation.

## 2018-09-11 NOTE — ED Triage Notes (Signed)
Pt arrives POV from home c/o shortness of breath that started yesterday and is "getting worse" per the pt; pt also reports being diaphoretic. Pt has hx of CHF and currently wears a heart monitor. Pt states she takes lasix daily but has been out of her medication for approx 1 week. Pt reports trying to getting medication refill from the pharmacy in which they contacted her MD but no response yet. Pt a&o x4

## 2018-09-11 NOTE — ED Provider Notes (Signed)
[facility] EMERGENCY DEPARTMENT Provider Note   CSN: [id number] Arrival date & time: 09/11/18  1004    History   Chief Complaint Chief Complaint  Patient presents with  . Shortness of Breath    HPI [name] is a 63 y.o. female with a past medical history of CHF with EF of 40 to 45% seen on echo done February 2020, diabetes, hypertension, hyperlipidemia, who presents to ED for shortness of breath for the past 24 hours.  States that she began feeling weak, diaphoretic yesterday morning.  She began having gradually worsening dyspnea.  States he took a deep breath and felt herself wheezing.  She denies any chest pain.  She does note a dry cough worse when she feels short of breath.  Notes that both of her legs have been swollen.  She admits that she has been out of her Lasix for the past week and has not been able to get in touch with her doctor for a refill.  She was diagnosed with CHF at her hospital admission 1 month ago.  She had an event monitor placed by Dr. Radford Pax 1 month ago and is scheduled to have this removed tomorrow.  Cardiac cath done 07/26/2018 shows patent coronary arteries with no significant obstructive disease, mild luminal irregularities noted on the RCA and LAD, low intracardiac filling pressures and preserved cardiac output.     HPI  Past Medical History:  Diagnosis Date  . Allergy   . Anxiety   . Aortic insufficiency    a. mild-mod by echo 07/2018.  [name] Arthritis   . Bipolar 2 disorder ([name])   . Cataract   . Chest pain   . Chronic combined systolic and diastolic CHF (congestive heart failure) ([place])   . Depression   . DM (diabetes mellitus), type 2 ([place])   . GERD (gastroesophageal reflux disease)   . HTN (hypertension)   . Hypercholesteremia   . Marijuana abuse   . Moderate mitral regurgitation   . Moderate tricuspid regurgitation   . NICM (nonischemic cardiomyopathy) ([place])   . Renal insufficiency   . Trigger finger    r index inj  12/08, bilateral thumb inj 11/09, r thumb and dequervain's inj 01/2009    Patient Active Problem List   Diagnosis Date Noted  . Acute systolic heart failure ([place]) 08/11/2018  . Hyperkalemia 08/11/2018  . Acute systolic CHF (congestive heart failure) ([place])   . Diabetes mellitus type 2 in nonobese (HCC)   . Bipolar disorder in full remission ([place])   . Malnutrition of moderate degree 07/23/2018  . Acute congestive heart failure ([place]) 07/22/2018  . Trigger finger 03/04/2018  . Diabetes mellitus due to underlying condition, uncontrolled, with diabetic neuropathy, with long-term current use of insulin ([place]) 03/04/2018  . Healthcare maintenance 10/29/2017  . COPD with emphysema ([place]) 02/06/2017  . 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 Poor appetite 10/04/2015  . Personal history of colonic polyps 09/13/2015  . Tardive dyskinesia 06/19/2014  . Cataracts, bilateral 03/21/2014  . Chronic diastolic CHF (congestive heart failure) ([place]) 07/26/2013  . Primary hypertension 08/12/2012  . Other bipolar disorder ([place]) 08/23/2009  . Fibromyalgia 08/06/2009  . Gastroparesis 03/22/2009  . 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 Tobacco use disorder 08/18/2006  . Hyperlipidemia due to type 2 diabetes mellitus ([place]) 08/13/2006  . Depression, major, recurrent ([place]) 08/13/2006    Past Surgical History:  Procedure Laterality Date  . ABDOMINAL HYSTERECTOMY  1986   fibroids  . CARDIAC CATHETERIZATION  05/06/2004   normal LV fxn EF>60%  . CYSTECTOMY     back of head  . LEFT HEART CATH AND CORONARY ANGIOGRAPHY N/A 07/26/2018   Procedure: LEFT HEART CATH AND CORONARY ANGIOGRAPHY;  Surgeon: Sherren Mocha, MD;  Location: [facility];  Service: Cardiovascular;  Laterality: N/A;  . LEFT HEART CATHETERIZATION WITH CORONARY ANGIOGRAM N/A 05/26/2011   Procedure: LEFT HEART CATHETERIZATION WITH CORONARY ANGIOGRAM;  Surgeon: Larey Dresser, MD;  Location: [facility] CATH LAB;   Service: Cardiovascular;  Laterality: N/A;  radial  . perirpheral iridectomy  02/12/2005  . TEE WITHOUT CARDIOVERSION N/A 08/05/2018   Procedure: TRANSESOPHAGEAL ECHOCARDIOGRAM (TEE);  Surgeon: Josue Hector, MD;  Location: [facility] ENDOSCOPY;  Service: Cardiovascular;  Laterality: N/A;     OB History   No obstetric history on file.      Home Medications    Prior to Admission medications   Medication Sig Start Date End Date Taking? Authorizing Provider  acetaminophen (TYLENOL) 500 MG tablet Take 1,000 mg by mouth every 6 (six) hours as needed for mild pain.    [provider]  atorvastatin (LIPITOR) 80 MG tablet Take 1 tablet (80 mg total) by mouth every morning. 07/27/18   Benay Pike, MD  Blood Glucose Monitoring Suppl (ONE TOUCH ULTRA 2) W/DEVICE KIT 1 kit by Does not apply route once. 04/11/15   Karamalegos, Devonne Doughty, DO  feeding supplement, ENSURE ENLIVE, (ENSURE ENLIVE) LIQD Take 237 mLs by mouth 3 (three) times daily between meals. 07/27/18   Benay Pike, MD  furosemide (LASIX) 20 MG tablet Take 1 tablet (20 mg total) by mouth daily. 09/11/18   Ari Engelbrecht, PA-C  glucose blood (ONE TOUCH ULTRA TEST) test strip Test 3 times a day. 05/22/17   [place] Bing, DO  insulin aspart (NOVOLOG) 100 UNIT/ML injection Inject 4-8 Units into the skin 2 (two) times daily with a meal. 09/10/17   Hensel, Jamal Collin, MD  insulin degludec (TRESIBA FLEXTOUCH) 100 UNIT/ML SOPN FlexTouch Pen Inject 0.26 mLs (26 Units total) into the skin daily. 09/10/17   Zenia Resides, MD  Lancets [facility] ULTRASOFT) lancets Use as instructed 04/11/15   Olin Hauser, DO  metoprolol succinate (TOPROL XL) 25 MG 24 hr tablet Take 4 tablets (100 mg total) by mouth daily. 08/03/18   Dunn, Nedra Hai, PA-C  mirtazapine (REMERON) 15 MG tablet Take 1 tablet (15 mg total) by mouth at bedtime. 07/30/18   Lockamy, Christia Reading, DO  nicotine (NICODERM CQ - DOSED IN MG/24 HOURS) 21 mg/24hr patch Place 1 patch (21  mg total) onto the skin daily. 07/30/18   Nuala Alpha, DO  ONE TOUCH LANCETS MISC 1 Device by Does not apply route 3 (three) times daily. 09/22/13   Melony Overly, MD  ranitidine (ZANTAC) 150 MG tablet Take 300 mg by mouth daily.    [provider]    Family History Family History  Problem Relation Age of Onset  . Diabetes Mother   . Heart disease Mother   . Heart attack Mother   . Bipolar disorder Mother   . Diabetes Father   . Bipolar disorder Sister   . Diabetes Sister   . Bipolar disorder Brother   . Diabetes Brother   . Asthma Son   . Diabetes Sister   . Diabetes Sister   . Hypertension Sister   . Diabetes Sister   . Diabetes Sister   . Diabetes  Sister   . Diabetes Brother   . Diabetes Brother   . Diabetes Brother   . Asthma Son   . Colon cancer Neg Hx   . Esophageal cancer Neg Hx     Social History Social History   Tobacco Use  . Smoking status: Former Smoker    Packs/day: 0.50    Years: 43.00    Pack years: 21.50    Types: Cigarettes  . Smokeless tobacco: Never Used  Substance Use Topics  . Alcohol use: Yes    Alcohol/week: 0.0 standard drinks    Comment: drinks socially occaisionally  . Drug use: Not Currently     Allergies   Ibuprofen and Lisinopril   Review of Systems Review of Systems  Constitutional: Negative for appetite change, chills and fever.  HENT: Negative for ear pain, rhinorrhea, sneezing and sore throat.   Eyes: Negative for photophobia and visual disturbance.  Respiratory: Positive for cough and shortness of breath. Negative for chest tightness and wheezing.   Cardiovascular: Positive for leg swelling. Negative for chest pain and palpitations.  Gastrointestinal: Negative for abdominal pain, blood in stool, constipation, diarrhea, nausea and vomiting.  Genitourinary: Negative for dysuria, hematuria and urgency.  Musculoskeletal: Negative for myalgias.  Skin: Negative for rash.  Neurological: Negative for dizziness,  weakness and light-headedness.     Physical Exam Updated Vital Signs BP (!) 147/95   Pulse 84   Temp 97.6 F (36.4 C) (Oral)   Resp 20   Ht 5' 2"  (1.575 m)   Wt 63.5 kg   SpO2 100%   BMI 25.61 kg/m   Physical Exam Vitals signs and nursing note reviewed.  Constitutional:      General: She is not in acute distress.    Appearance: She is well-developed.     Comments: Speaking in complete sentences without difficulty.  HENT:     Head: Normocephalic and atraumatic.     Nose: Nose normal.  Eyes:     General: No scleral icterus.       Left eye: No discharge.     Conjunctiva/sclera: Conjunctivae normal.  Neck:     Musculoskeletal: Normal range of motion and neck supple.  Cardiovascular:     Rate and Rhythm: Regular rhythm. Tachycardia present.     Heart sounds: Normal heart sounds. No murmur. No friction rub. No gallop.   Pulmonary:     Effort: Pulmonary effort is normal. No respiratory distress.     Breath sounds: Normal breath sounds.     Comments: Crackles bilateral lung fields. Abdominal:     General: Bowel sounds are normal. There is no distension.     Palpations: Abdomen is soft.     Tenderness: There is no abdominal tenderness. There is no guarding.  Musculoskeletal: Normal range of motion.     Comments: Trace pitting edema in bilateral lower extremities.  Skin:    General: Skin is warm and dry.     Findings: No rash.  Neurological:     Mental Status: She is alert.     Motor: No abnormal muscle tone.     Coordination: Coordination normal.      ED Treatments / Results  Labs (all labs ordered are listed, but only abnormal results are displayed) Labs Reviewed  BASIC METABOLIC PANEL - Abnormal; Notable for the following components:      Result Value   CO2 21 (*)    Glucose, Bld 269 (*)    All other components within normal limits  CBC  WITH DIFFERENTIAL/PLATELET - Abnormal; Notable for the following components:   RBC 3.78 (*)    Hemoglobin 11.5 (*)    HCT  35.4 (*)    All other components within normal limits  BRAIN NATRIURETIC PEPTIDE - Abnormal; Notable for the following components:   B Natriuretic Peptide 773.0 (*)    All other components within normal limits  TROPONIN I  TROPONIN I    EKG EKG Interpretation  Date/Time:  Saturday September 11 2018 10:23:42 EDT Ventricular Rate:  110 PR Interval:    QRS Duration: 123 QT Interval:  341 QTC Calculation: 462 R Axis:   71 Text Interpretation:  Sinus tachycardia Probable left atrial enlargement Left bundle branch block Abnormal morphology of QRS in inferior leads, no other significant changes in comparison to prior ECG Confirmed by Gareth Morgan [phone]) on 09/11/2018 11:20:17 AM   Radiology Dg Chest 2 View  Result Date: 09/11/2018 CLINICAL DATA:  Shortness of breath starting yesterday. Diaphoresis. EXAM: CHEST - 2 VIEW COMPARISON:  07/22/2018 FINDINGS: Cardiac monitor projecting over the anterior chest wall. Midline trachea. Borderline cardiomegaly. Atherosclerosis in the transverse aorta. No pleural effusion or pneumothorax. No lobar consolidation. Mild pulmonary venous congestion. IMPRESSION: Borderline cardiomegaly and mild pulmonary venous congestion. Aortic Atherosclerosis (ICD10-I70.0). Electronically Signed   By: Abigail Miyamoto M.D.   On: 09/11/2018 10:49    Procedures Procedures (including critical care time)  Medications Ordered in ED Medications  metoprolol succinate (TOPROL-XL) 24 hr tablet 100 mg (100 mg Oral Given 09/11/18 1124)  furosemide (LASIX) injection 40 mg (40 mg Intravenous Given 09/11/18 1304)     Initial Impression / Assessment and Plan / ED Course  I have reviewed the triage vital signs and the nursing notes.  Pertinent labs & imaging results that were available during my care of the patient were reviewed by me and considered in my medical decision making (see chart for details).  Clinical Course as of Sep 11 1511  Sat Sep 11, 2018  1109 She did not take her  morning dose of metoprolol today, which she usually takes around this time of day.  BP(!): 150/139 [HK]    Clinical Course User Index [HK] Delia Heady, PA-C       63 year old female with past medical history of CHF with EF of 40-45% seen on echo done last month, diabetes, hypertension, hyperlipidemia presents to ED for shortness of breath for the past 24 hours.  She reports generalized weakness, diaphoresis.  Denies chest pain.  She admits that she has been out of her Lasix for 1 week and has not been in touch with her doctor to refill it.  She was diagnosed with CHF at her hospital admission 1 month ago.  She also had a cardiac cath done that shows patent coronary arteries with preserved cardiac output.  On my exam patient initially tachypneic and tachycardic.  Some lower extremity edema noted.  Fine crackles noted in bilateral lung fields as well.  She is speaking complete sentence without difficulty.  She is not hypoxic.  CBC unremarkable.  BNP elevated at 773 which is slightly less than her last prior.  Troponin is negative.  Chest x-ray shows borderline cardiomegaly and mild pulmonary venous congestion.  EKG with abnormal morphology of QRS inferior leads but otherwise no changes from prior.  Patient was given a dose of her home antihypertensive as well as IV Lasix.  She ambulated here without difficulty, had improvement in her symptoms and was not hypoxic during ambulation or at rest.  Her repeat troponin is negative.  Will give a refill of Lasix and have her follow-up with PCP and cardiologist.  Advised to return to ED for any severe worsening symptoms. Patient agreeable to plan.  Patient is hemodynamically stable, in NAD, and able to ambulate in the ED. Evaluation does not show pathology that would require ongoing emergent intervention or inpatient treatment. I explained the diagnosis to the patient. Pain has been managed and has no complaints prior to discharge. Patient is comfortable with above  plan and is stable for discharge at this time. All questions were answered prior to disposition. Strict return precautions for returning to the ED were discussed. Encouraged follow up with PCP.    Portions of this note were generated with Lobbyist. Dictation errors may occur despite best attempts at proofreading.  Final Clinical Impressions(s) / ED Diagnoses   Final diagnoses:  Acute on chronic congestive heart failure, unspecified heart failure type [facility])    ED Discharge Orders         Ordered    furosemide (LASIX) 20 MG tablet  Daily     09/11/18 1513           Delia Heady, PA-C 09/11/18 1513    Gareth Morgan, MD 09/12/18 [phone]

## 2018-09-11 NOTE — ED Notes (Signed)
Patient verbalizes understanding of discharge instructions. Opportunity for questioning and answers were provided. Armband removed by staff, pt discharged from ED.  

## 2018-09-14 ENCOUNTER — Telehealth: Payer: Self-pay | Admitting: Family Medicine

## 2018-09-14 ENCOUNTER — Ambulatory Visit (INDEPENDENT_AMBULATORY_CARE_PROVIDER_SITE_OTHER): Payer: Medicare HMO | Admitting: Family Medicine

## 2018-09-14 ENCOUNTER — Other Ambulatory Visit: Payer: Self-pay

## 2018-09-14 ENCOUNTER — Encounter: Payer: Self-pay | Admitting: Family Medicine

## 2018-09-14 DIAGNOSIS — I5023 Acute on chronic systolic (congestive) heart failure: Secondary | ICD-10-CM

## 2018-09-14 DIAGNOSIS — I509 Heart failure, unspecified: Secondary | ICD-10-CM | POA: Insufficient documentation

## 2018-09-14 DIAGNOSIS — R0602 Shortness of breath: Secondary | ICD-10-CM

## 2018-09-14 NOTE — Telephone Encounter (Signed)
[facility] Telemedicine Visit  Patient consented to have visit conducted via telephone.  Encounter participants: Patient: [name]  Provider: Wilber Oliphant, MD  PCP: [place] Bing, DO Subjective:  CC: Shortness of breath  HPI:  [name] is a 63 y.o. female with past medical history significant for HFrEF (EF 45-50%) w/ moderate TR and AR -last hospitalized for heart failure exacerbation on 07/27/2018 for 6 days. She called today and reports that she has had worsening shortness of breath.  She reports orthopnea and dyspnea on exertion.  Her dry weight is typically 128.7 pounds but she is 140 pounds now.  The last time she was at her dry weight was 2 weeks ago, when she ran out of her Lasix, of which she is prescribed 20 mg daily.  Her cardiologist is Dr. Radford Pax.  Patient was seen at [facility], ED on Saturday 3/29 for shortness of breath.  On physical exam, patient was noted to be speaking in full sentences, tachycardic, with bilateral crackles and trace pitting edema in bilateral lower extremities.  Her BNP was 773 (previously 985.8 on 07/22/18.  Patient received 40 mg IV Lasix and had over 1 L output.  She reports that her shortness of breath improved and she was able to pass the walk test in the ED without hypoxia.  Patient was sent home with a refill of 20 mg Lasix daily.   She has been taking 20 mg each morning and reports that she is not having much output.  Patient is unable to check her blood pressure at home, but reports that she has a little bit of dizziness.  Medications & Allergies:  20mg  lasix  Pertinent Medical, Family and Social History: Tobacco use, IDDM, COPD, systolic heart failure  Objective:  Pertinent Labs & Imaging:  Creatinine on 09/11/2018 is 0.67, potassium 3.6 Last albumin on 07/27/2018 is 3.1  Assessment & Plan:  [name] was seen today for advice only.  Diagnoses and all orders for this visit:  Shortness of  breath  Shortness of breath Patient shortness of breath is most likely due to continuation of heart failure exacerbation.  She seemed to respond well to IV Lasix 40 mg at the ED on Saturday, past a walk test and was hemodynamically stable.  Upon discharge, patient was prescribed 20 mg of Lasix and she reports she has not been having much output.  She continues to have shortness of breath, and is above her dry weight at 140 pounds.  She does not have any mucus production or persistent cough and I do not believe that this is a COPD exacerbation.  It is likely that she requires higher dose of Lasix and I have instructed patient to take 80 mg PO (around noon today) and to call back around 4 PM to be reevaluated over the phone. After speaking with Dr. Gwendlyn Deutscher, patient can be seen this afternoon and access to care clinic to double check blood pressure and ensure hemodynamic instability in the setting of mild dizziness and possibly obtain labs to assess kidney function and potassium. Patient to come in at 1:45 PM today.  Discussed with Preceptor, Dr. Gwendlyn Deutscher   Time spent on phone with patient: 20 minutes No charge - Will charge for visit later on today  Zettie Cooley, M.D. [place]  PGY -1 09/14/2018, 12:39 PM

## 2018-09-14 NOTE — Assessment & Plan Note (Signed)
Patient responded well to 80 mg of Lasix PO.  She reports that she has had 5 voids since noon and that her shortness of breath is starting to feel better.  She is hemodynamically stable with blood pressure 126/62, pulse 90, O2 99%.  She is fluid overloaded on exam with bilateral crackles to midlung posteriorly.  I do not believe that she is intravascularly depleted as she has palpable pulses and warm extremities.    Continue 80 mg Lasix every morning  Daily weights  If shortness of breath continues after 80 mg, patient told to call in.  Can likely increase to twice daily in this case.  Patient to follow-up with Dr. Yisroel Ramming on Friday  Patient will need BMP on Friday  Did not provide potassium in the setting of diuresis with Lasix as patient was put back on her candesartan.

## 2018-09-14 NOTE — Progress Notes (Signed)
  Established Patient - Clinic Visit  Subjective:  PCP: [place] Bing, DO Patient ID: MRN [id number]  Date of birth: 10/10/1955  CC: Shortness of breath  HPI:  Patient called earlier today with shortness of breath most likely due to a heart failure exacerbation.  She also reports that she has been having some mild dizziness.  The dizziness started on Saturday and improved after being diuresed.  She reports that it has come back and seems to be associated with her shortness of breath.  She was seen at the ED 4 days ago and responded well to IV Lasix 40 mg and was discharged home after passing a walk test.  She reports that she has not been voiding with only 20 mg every morning.  During the time of the phone call, patient was told to take 80 mg of Lasix (as she responded well to IV 40 mg).  Upon further discussion with the preceptor, Dr. Gwendlyn Deutscher wanted to see patient to ensure hemodynamic stability in setting of mild dizziness that patient was complaining about.  Review of Symptoms: See HPI  Medications & Allergies: Reviewed with patient and updated in EMR as appropriate.   Social History: Darice reports that she has quit smoking. Her smoking use included cigarettes. She has a 21.50 pack-year smoking history. She has never used smokeless tobacco. She reports current alcohol use. She reports previous drug use. Objective:  Physical Exam:  BP 126/62   Pulse 90   Temp (!) 97.4 F (36.3 C) (Oral)   Ht 5\' 2"  (1.575 m)   Wt 142 lb (64.4 kg)   SpO2 99%   BMI 25.97 kg/m   General: Patient speaking in full sentences, no acute distress, nontoxic Chest: Nontender to palpation, full systolic murmur, S1 and S2 audible.  1+ radial pulses.  Mild lower extremity edema Lungs: Bilateral crackles to midlung posteriorly  Pertinent Labs & Imaging:  K 3.6  Assessment & Plan:  Acute on chronic heart failure (HCC) Patient responded well to 80 mg of Lasix PO.  She reports that she has had 5 voids since  noon and that her shortness of breath is starting to feel better.  She is hemodynamically stable with blood pressure 126/62, pulse 90, O2 99%.  She is fluid overloaded on exam with bilateral crackles to midlung posteriorly.  I do not believe that she is intravascularly depleted as she has palpable pulses and warm extremities.    Continue 80 mg Lasix every morning  Daily weights  If shortness of breath continues after 80 mg, patient told to call in.  Can likely increase to twice daily in this case.  Patient to follow-up with Dr. Yisroel Ramming on Friday  Patient will need BMP on Friday  Did not provide potassium in the setting of diuresis with Lasix as patient was put back on her candesartan.  Follow-up:  Future Appointments  Date Time Provider [place]  09/17/2018 10:30 AM Florene Route [facility]  09/29/2018  9:30 AM [place] Bing, DO [facility]  11/11/2018  9:20 AM Sueanne Margarita, MD [facility]    Zettie Cooley, M.D. [place]  PGY -1 09/14/2018, 2:41 PM

## 2018-09-14 NOTE — Assessment & Plan Note (Addendum)
Patient shortness of breath is most likely due to continuation of heart failure exacerbation.  She seemed to respond well to IV Lasix 40 mg at the ED on Saturday, past a walk test and was hemodynamically stable.  Upon discharge, patient was prescribed 20 mg of Lasix and she reports she has not been having much output.  She continues to have shortness of breath, and is above her dry weight at 140 pounds.  She does not have any mucus production or persistent cough and I do not believe that this is a COPD exacerbation.  It is likely that she requires higher dose of Lasix and I have instructed patient to take 80 mg PO (around noon today) and to call back around 4 PM to be reevaluated over the phone. After speaking with Dr. Gwendlyn Deutscher, patient can be seen this afternoon and access to care clinic to double check blood pressure and ensure hemodynamic instability in the setting of mild dizziness and possibly obtain labs to assess kidney function and potassium. Patient to come in at 1:45 PM today.  Patient will need prescription for potassium if plans to further diuresis Lasix at home

## 2018-09-14 NOTE — Telephone Encounter (Signed)
I recommended not treating with Lasix over the telephone. Patient need hospital f/u appointment today.

## 2018-09-14 NOTE — Patient Instructions (Addendum)
Dear [name],   It was very nice to see you! Thank you for taking your time to come in to be seen. Today, we discussed the following:   Heart Failure   Take 80 mg Lasix in the morning  Weigh yourself daily   Dr. Yisroel Ramming will check your labs on Friday  Weigh yourself daily and write these numbers down (bring with you to your appointment with Dr. Yisroel Ramming).   Continue to rest of your medication as normal   Be well,   Dr. Zettie Cooley [facility] [phone]   Sign up for MyChart for instant access to your health profile, labs, orders, upcoming appointments or to contact your provider with questions.

## 2018-09-15 ENCOUNTER — Telehealth: Payer: Self-pay | Admitting: *Deleted

## 2018-09-15 MED ORDER — METOPROLOL SUCCINATE ER 50 MG PO TB24: ORAL_TABLET | ORAL | 3 refills | Status: DC

## 2018-09-15 NOTE — Telephone Encounter (Signed)
-----   Message from Charlie Pitter, [place] sent at 09/14/2018  7:43 AM EDT ----- Please inform pt of result below - multiple episodes of SVT (fast HR) which we saw in the hospital. No atrial fib identified. Most recent BP/HR reviewed from ER, seems to be able to support additional BB. If compliant with Toprol 100mg , would recommend she increase to 100mg  QAM and 50mg  QPM  Dayna Dunn PA-C

## 2018-09-17 ENCOUNTER — Other Ambulatory Visit: Payer: Self-pay

## 2018-09-17 ENCOUNTER — Other Ambulatory Visit: Payer: Medicare HMO

## 2018-09-17 ENCOUNTER — Ambulatory Visit: Payer: Medicare HMO

## 2018-09-17 ENCOUNTER — Ambulatory Visit: Payer: Medicare HMO | Admitting: Family Medicine

## 2018-09-17 DIAGNOSIS — E1169 Type 2 diabetes mellitus with other specified complication: Secondary | ICD-10-CM | POA: Diagnosis not present

## 2018-09-17 DIAGNOSIS — E785 Hyperlipidemia, unspecified: Secondary | ICD-10-CM | POA: Diagnosis not present

## 2018-09-18 LAB — COMPREHENSIVE METABOLIC PANEL
ALT: 45 IU/L — ABNORMAL HIGH (ref 0–32)
AST: 32 IU/L (ref 0–40)
Albumin/Globulin Ratio: 1.5 (ref 1.2–2.2)
Albumin: 4.3 g/dL (ref 3.8–4.8)
Alkaline Phosphatase: 211 IU/L — ABNORMAL HIGH (ref 39–117)
BUN/Creatinine Ratio: 17 (ref 12–28)
BUN: 15 mg/dL (ref 8–27)
Bilirubin Total: 0.6 mg/dL (ref 0.0–1.2)
CO2: 24 mmol/L (ref 20–29)
Calcium: 9.7 mg/dL (ref 8.7–10.3)
Chloride: 95 mmol/L — ABNORMAL LOW (ref 96–106)
Creatinine, Ser: 0.86 mg/dL (ref 0.57–1.00)
GFR calc Af Amer: 83 mL/min/{1.73_m2} (ref >59)
GFR calc non Af Amer: 72 mL/min/{1.73_m2} (ref >59)
Globulin, Total: 2.8 g/dL (ref 1.5–4.5)
Glucose: 432 mg/dL — ABNORMAL HIGH (ref 65–99)
Potassium: 4.1 mmol/L (ref 3.5–5.2)
Sodium: 134 mmol/L (ref 134–144)
Total Protein: 7.1 g/dL (ref 6.0–8.5)

## 2018-09-18 LAB — LIPID PANEL
Chol/HDL Ratio: 2.9 ratio (ref 0.0–4.4)
Cholesterol, Total: 188 mg/dL (ref 100–199)
HDL: 65 mg/dL (ref >39)
LDL Calculated: 93 mg/dL (ref 0–99)
Triglycerides: 148 mg/dL (ref 0–149)
VLDL Cholesterol Cal: 30 mg/dL (ref 5–40)

## 2018-09-20 ENCOUNTER — Other Ambulatory Visit: Payer: Self-pay | Admitting: *Deleted

## 2018-09-20 MED ORDER — FUROSEMIDE 20 MG PO TABS: 20.0000 mg | ORAL_TABLET | Freq: Every day | ORAL | 0 refills | Status: DC

## 2018-09-20 NOTE — Telephone Encounter (Signed)
Pt states that she is taking her last dose this am.  She was instructed to take 80mg  at last visit, so she is out of her refill from the hospital.  Christen Bame, CMA

## 2018-09-21 ENCOUNTER — Other Ambulatory Visit: Payer: Self-pay

## 2018-09-21 ENCOUNTER — Other Ambulatory Visit: Payer: Self-pay | Admitting: Family Medicine

## 2018-09-21 DIAGNOSIS — I10 Essential (primary) hypertension: Secondary | ICD-10-CM

## 2018-09-21 DIAGNOSIS — I5032 Chronic diastolic (congestive) heart failure: Secondary | ICD-10-CM

## 2018-09-21 MED ORDER — FUROSEMIDE 80 MG PO TABS: 80.0000 mg | ORAL_TABLET | Freq: Every day | ORAL | 3 refills | Status: DC

## 2018-09-21 NOTE — Telephone Encounter (Signed)
Pt called nurse line stating she needs a prescription for lasix to reflect she is taking 80mg  daily. The 90 day supply of lasix 20mg  is being rejected by the pharmacy.

## 2018-09-21 NOTE — Telephone Encounter (Signed)
Pt informed.  Sharica Roedel, CMA  

## 2018-09-21 NOTE — Telephone Encounter (Signed)
Correction made. Discontinued Lasix 20 mg daily and sent in new Rx for 80 mg daily. Please advise.  Harriet Butte, [place], PGY-3

## 2018-09-21 NOTE — Telephone Encounter (Signed)
Her pharmacy says she needs a new prescription for the 80 mg Lasix.  Pharmacy is  [facility] on [address].  Please call to discuss.

## 2018-09-29 ENCOUNTER — Telehealth: Payer: Self-pay | Admitting: *Deleted

## 2018-09-29 ENCOUNTER — Other Ambulatory Visit: Payer: Self-pay

## 2018-09-29 ENCOUNTER — Telehealth (INDEPENDENT_AMBULATORY_CARE_PROVIDER_SITE_OTHER): Payer: Medicare HMO | Admitting: Family Medicine

## 2018-09-29 ENCOUNTER — Ambulatory Visit: Payer: Medicare HMO | Admitting: Family Medicine

## 2018-09-29 DIAGNOSIS — M25552 Pain in left hip: Secondary | ICD-10-CM | POA: Diagnosis not present

## 2018-09-29 DIAGNOSIS — K219 Gastro-esophageal reflux disease without esophagitis: Secondary | ICD-10-CM

## 2018-09-29 DIAGNOSIS — R7401 Elevation of levels of liver transaminase levels: Secondary | ICD-10-CM | POA: Insufficient documentation

## 2018-09-29 DIAGNOSIS — M79642 Pain in left hand: Secondary | ICD-10-CM | POA: Insufficient documentation

## 2018-09-29 DIAGNOSIS — I10 Essential (primary) hypertension: Secondary | ICD-10-CM

## 2018-09-29 DIAGNOSIS — E785 Hyperlipidemia, unspecified: Secondary | ICD-10-CM

## 2018-09-29 DIAGNOSIS — E1169 Type 2 diabetes mellitus with other specified complication: Secondary | ICD-10-CM

## 2018-09-29 DIAGNOSIS — R740 Nonspecific elevation of levels of transaminase and lactic acid dehydrogenase [LDH]: Secondary | ICD-10-CM

## 2018-09-29 DIAGNOSIS — E119 Type 2 diabetes mellitus without complications: Secondary | ICD-10-CM

## 2018-09-29 DIAGNOSIS — M25559 Pain in unspecified hip: Secondary | ICD-10-CM | POA: Insufficient documentation

## 2018-09-29 MED ORDER — FAMOTIDINE 40 MG PO TABS: 40.0000 mg | ORAL_TABLET | Freq: Every day | ORAL | 0 refills | Status: DC

## 2018-09-29 NOTE — Telephone Encounter (Signed)
Pt informed of message form MD. Christen Bame, CMA

## 2018-09-29 NOTE — Progress Notes (Signed)
[facility] Tele[facility]icine Visit  Patient consented to have virtual visit. Method of visit: Video visit  Encounter participants: Patient: [name] - located at Home Provider: Andrena Mews - located at offsite Others (if applicable): NA  Chief Complaint: hand and hip pain. F/U on chronic problems.  HPI:  Left Leg Pain: C/O left hip pain, which started about one week ago after she attempted to get up from a sitting position. Her leg often gives out on her when she walks, and she denies fall, no leg numbness or weakness. Pain currently is about 5/10 in severity. However, it worsens with ambulation to about 10/10 in severity. Climbing the stairs makes it worse  Massaging her leg helps.  Hand: C/O Chronic recurrent left hand pain on the palmar surface. She stated that she has three knots on her palm that causes pain, and her middle finger locks up often as well. About six months ago, she got a cortisone shot at the sports medicine center, which help; she will like to get another shot.  HTN/HLD: She is compliant with her Metoprolol XR 100 mg QD and Lasix 80 mg QD for her BP and on Lipitor 80 mg QD for HLD.  DM2: She is compliant with her Novolog sliding scale, and Tresibar 26 units QD. Home CBG has been within range. No hypoglycemic complaint.  GERD: C/O worsening epigastric burning pain. She was previously on Zantac, but d/ced since it does not work. She is currently taking OTC famotidine 20 mg 2 tabs daily with minimal improvement. No other GI concern.  Elevated Liver Enzymes: Denies RUQ pain, no N/V, no change in bowel habits. She was informed to stop Zetia and Candenstin by PCP and Cardiologist. Her Lipitor was recently increased from 40 mg to 80 mg daily about 2-3 weeks ago. Denies any other concern.  ROS: per HPI  Pertinent PMHx: Problem list reviewed  Exam:  Respiratory: not in distress Ext: no swelling or erythema of her left hand. I see her  ambulate without difficulty during this visit.  Assessment/Plan:  Hip pain Acute hip pain. ?? New-onset arthritis. No trauma. Home graded exercise. PT referral discussed and was completed. Continue Tylenol as needed for pain. Consider xray if there is no improvement. Return precaution discussed.  Hand pain, left Associated with triggered finger and nodules. Likely some element of dupuytren contraction. Exam limited today. Referral to sport medicine placed. I encouraged her to call Sports Med clinic to schedule f/u appointment as well. She agreed with the plan.  Primary hypertension Continue current regimen. F/U with PCP and Cards soon for meds adjustment.  Hyperlipidemia due to type 2 diabetes mellitus ([place]) Continue current regimen. F/U with PCP and Cards soon for meds adjustment. Lab reviewed and discussed with her.  Diabetes mellitus type 2 in nonobese (HCC) Labs and A1C reviewed and discussed with her. Return in May for A1C check. Continue home CBG monitoring.  GERD (gastroesophageal reflux disease) Most recent colonoscopy was in 2017. No EGD done. Referral to GI recommended and was completed. D/C Zantac. D/C OTC Pepcid. I escribed Pepcid 40 mg daily for 4-6 weeks. F/U soon if there is no improvement.   Note: I called pharmacy to clarify tablet to dispensation of 30 and not 60. I spoke with Verlee Monte and she took off the 60 tablet prescription.  Transaminitis Looks like this has been ongoing for few months. May be medication induced. Lipitor was recently increased about a week prior to her lab work. FLP  reviewed and it seems since she went up on her Lipitor, her LDL has improved some. Need to weight risk of high dose statin for CHF, DM with hepatotoxicity. Options given to either cut back on Lipitor or recheck in few weeks and if still elevated to cut back on lipitor. Opted for second choice. Appointment made to recheck hepatic panel in 3 weeks. She is advised  to call soon if she develops RUQ pain or becomes symptomatic. She will need hepatic panel as well. F/U with Cards as plan for Lipitor management. She verbalized understanding.     Time spent during visit with patient: 34 minutes

## 2018-09-29 NOTE — Assessment & Plan Note (Signed)
Acute hip pain. ?? New-onset arthritis. No trauma. Home graded exercise. PT referral discussed and was completed. Continue Tylenol as needed for pain. Consider xray if there is no improvement. Return precaution discussed.

## 2018-09-29 NOTE — Assessment & Plan Note (Signed)
Associated with triggered finger and nodules. Likely some element of dupuytren contraction. Exam limited today. Referral to sport medicine placed. I encouraged her to call Sports Med clinic to schedule f/u appointment as well. She agreed with the plan.

## 2018-09-29 NOTE — Assessment & Plan Note (Signed)
Labs and A1C reviewed and discussed with her. Return in May for A1C check. Continue home CBG monitoring.

## 2018-09-29 NOTE — Assessment & Plan Note (Signed)
Continue current regimen. F/U with PCP and Cards soon for meds adjustment. Lab reviewed and discussed with her.

## 2018-09-29 NOTE — Assessment & Plan Note (Signed)
Looks like this has been ongoing for few months. May be medication induced. Lipitor was recently increased about a week prior to her lab work. FLP reviewed and it seems since she went up on her Lipitor, her LDL has improved some. Need to weight risk of high dose statin for CHF, DM with hepatotoxicity. Options given to either cut back on Lipitor or recheck in few weeks and if still elevated to cut back on lipitor. Opted for second choice. Appointment made to recheck hepatic panel in 3 weeks. She is advised to call soon if she develops RUQ pain or becomes symptomatic. She will need hepatic panel as well. F/U with Cards as plan for Lipitor management. She verbalized understanding.

## 2018-09-29 NOTE — Assessment & Plan Note (Addendum)
Most recent colonoscopy was in 2017. No EGD done. Referral to GI recommended and was completed. D/C Zantac. D/C OTC Pepcid. I escribed Pepcid 40 mg daily for 4-6 weeks. F/U soon if there is no improvement.   Note: I called pharmacy to clarify tablet to dispensation of 30 and not 60. I spoke with Verlee Monte and she took off the 60 tablet prescription.

## 2018-09-29 NOTE — Assessment & Plan Note (Signed)
Continue current regimen. F/U with PCP and Cards soon for meds adjustment.

## 2018-09-29 NOTE — Telephone Encounter (Signed)
-----   Message from Kinnie Feil, MD sent at 09/29/2018 10:31 AM EDT ----- Please contact patient and let her know that I have escribed a higher dose of her Pepcid and she is to take it once daily and not twice daily. I also referred her to the gastroenterologist.

## 2018-09-30 ENCOUNTER — Other Ambulatory Visit: Payer: Self-pay

## 2018-09-30 ENCOUNTER — Encounter: Payer: Self-pay | Admitting: Sports Medicine

## 2018-09-30 ENCOUNTER — Ambulatory Visit (INDEPENDENT_AMBULATORY_CARE_PROVIDER_SITE_OTHER): Payer: Medicare HMO | Admitting: Sports Medicine

## 2018-09-30 VITALS — BP 163/92 | HR 83 | Temp 98.6°F | Ht 62.0 in | Wt 140.0 lb

## 2018-09-30 DIAGNOSIS — M6530 Trigger finger, unspecified finger: Secondary | ICD-10-CM | POA: Diagnosis not present

## 2018-09-30 DIAGNOSIS — M25552 Pain in left hip: Secondary | ICD-10-CM | POA: Diagnosis not present

## 2018-09-30 DIAGNOSIS — M720 Palmar fascial fibromatosis [Dupuytren]: Secondary | ICD-10-CM

## 2018-09-30 MED ORDER — METHYLPREDNISOLONE ACETATE 40 MG/ML IJ SUSP
40.0000 mg | Freq: Once | INTRAMUSCULAR | Status: AC
  Administered 2018-09-30: 11:00:00 40 mg via INTRA_ARTICULAR

## 2018-09-30 MED ORDER — METHYLPREDNISOLONE ACETATE 40 MG/ML IJ SUSP
20.0000 mg | Freq: Once | INTRAMUSCULAR | Status: AC
  Administered 2018-09-30: 20 mg via INTRA_ARTICULAR

## 2018-09-30 NOTE — Patient Instructions (Signed)
You have 3 different diagnoses that we evaluated today.  1.  Left trigger finger.  Today we performed a steroid injection to treat her symptoms.  I expect this to be beneficial.  However, if your symptoms return again, follow-up here in clinic.  At that point, it may be worth seeing a hand surgeon to have it treated surgically.   2.  Dupuytren's contracture of the left palm.  This is the "rod "that you feel in your hand.  We will continue to monitor this.  If it significantly worsens, this is something that can be treated surgically as well   3.  Greater trochanteric pain syndrome.  Today we did the injection for this as well to help with your symptoms.  The most important thing for this other rehabilitation exercises.  Perform these about 5 days/week for the next 6 weeks.    In general, ice may be helpful.  Continue Tylenol as needed.  Follow-up with [place] as needed

## 2018-09-30 NOTE — Progress Notes (Signed)
QUETZALLI CLOS - 63 y.o. female MRN [id number]  Date of birth: March 17, 1956    SUBJECTIVE:      Chief Complaint: Left hip and left finger pain  HPI:  63 year old female presents with approximately 1 month of 6/10 left middle finger pain.  Patient has a history of trigger finger.  She was seen for this in September 2019 at which time a steroid injection performed.  She did very well with this but recently began having increasing symptoms.  She reports palmar sided pain and locking.  This interferes with the activity makes it difficult for her to make a closed fist.  She denies any swelling or bruising.  She also notes having a "rod" in her hand at the radial aspect of her palm.  This is slightly bothersome and has been present for an unknown amount of time.  Patient also has lateral left hip pain for about 1 week.  She denies any specific injury.  Her pain is worse with walking or when laying on her left side.  She states she feels like her leg wants to give out under her.  It only improves with rest.  She is taking Tylenol without benefit.  She denies any back pain.  No numbness or tingling distally.  She denies any groin pain.   ROS:     See HPI. All other reviewed systems negative.  PERTINENT  PMH / PSH FH / / SH:  Past Medical, Surgical, Social, and Family History Reviewed & Updated in the EMR.  Pertinent findings include:  Diabetes  OBJECTIVE: BP (!) 163/92   Pulse 83   Temp 98.6 F (37 C) (Oral)   Ht 5\' 2"  (1.575 m)   Wt 140 lb (63.5 kg)   BMI 25.61 kg/m   Physical Exam:  Vital signs are reviewed.  GEN: Alert and oriented, NAD Pulm: Breathing unlabored PSY: normal mood, congruent affect  MSK: Left wrist/Hand: Inspection: There is a mild palmar contracture overlying the second metacarpal. No swelling, erythema or bruising Palpation: Tenderness to palpation over the A1 pulley of the middle finger ROM: Full range of motion of the wrist.  She has limited range of motion in  flexion of the middle finger due to pain and feeling of tightness. Strength: 5/5 strength in the forearm, wrist, and interosseus muscles Neurovascular: NV intact Special tests: negative tinel's at the carpal tunnel  Right wrist/hand: No obvious deformity Full range of motion.  Able to make complete fist 5/5 strength N/V intact   Left hip:  - Inspection: No gross deformity, no swelling, erythema, or ecchymosis - Palpation: Significant tenderness over the greater trochanter - ROM: Normal range of motion on Flexion abduction, internal and external rotation.  Some lateral pain with passive IR/ER - Strength: 4/5 strength in hip abduction - Neuro/vasc: NV intact distally - Special Tests: Lateral pain with FABER and FADIR.  Negative logroll.  Positive Trendelenberg.    Right hip: No S deformity No tenderness over greater trochanter Full range of motion.  No pain with passive IR/ER N/V intact distally Negative Trendelenburg     ASSESSMENT & PLAN:  1.  Trigger finger of the left middle finger- this is a recurrence.  Steroid injection performed again today.  If this returns again, consider referral for surgical release.  2.  Dupuytren's contracture in the left palm- currently is is very mild and not affecting mechanical movement of the hand.  We will continue to monitor.  Will refer if worsening  3.  Left greater trochanteric pain-no concern for intra-articular pathology based on exam.  Steroid injection performed today as described below.  Patient right with home exercises for strengthening.  Follow-up as needed    After informed written consent timeout was performed and patient was seated in chair of exam room.  Area overlying Left middle finger A1 pulley prepped with alcohol swab then injected with 0.5:0.82mL lidocaine: depomedrol.  Patient tolerated procedure well without immediate complications.   Procedure performed: Greater trochanteric corticosteroid injection; palpation  guided Consent obtained and verified. Time-out conducted. Noted no overlying erythema, induration, or other signs of local infection. The point of maximal tenderness was palpated over the left GT and and marked. The overlying skin was prepped in a sterile fashion. Topical analgesic spray: Ethyl chloride. Needle: 22GA, 3.5" Completed without difficulty. Meds: depomedrol 40mg , lidocaine 6cc  Patient seen and evaluated with the sports medicine fellow.  I agree with the above plan of care.  Patient's trigger finger and greater trochanteric bursa were both injected with cortisone today.  This was done after risks and benefits were explained including the risk of transient hyperglycemia given her diabetes.  Patient understands.  She is also given some home exercises for her hip pain.  She will follow-up for ongoing or recalcitrant issues.

## 2018-10-05 ENCOUNTER — Other Ambulatory Visit: Payer: Self-pay | Admitting: Family Medicine

## 2018-10-05 DIAGNOSIS — E1165 Type 2 diabetes mellitus with hyperglycemia: Principal | ICD-10-CM

## 2018-10-05 DIAGNOSIS — IMO0002 Reserved for concepts with insufficient information to code with codable children: Secondary | ICD-10-CM

## 2018-10-05 DIAGNOSIS — E1142 Type 2 diabetes mellitus with diabetic polyneuropathy: Secondary | ICD-10-CM

## 2018-10-05 DIAGNOSIS — Z794 Long term (current) use of insulin: Principal | ICD-10-CM

## 2018-10-05 DIAGNOSIS — R69 Illness, unspecified: Secondary | ICD-10-CM | POA: Diagnosis not present

## 2018-10-10 ENCOUNTER — Telehealth: Payer: Self-pay | Admitting: Family Medicine

## 2018-10-10 NOTE — Telephone Encounter (Signed)
After Hours/Emergency Line Call  Received after hours call patient regarding new onset dizziness as of this morning.  Reports CBGs at 460 after breakfast which improved to 264 after taking 8 units of NovoLog.  She also reports taking her [place].  She checked her blood pressure and reports 109/69 with a heart rate of 91 followed by 140/75.  She went to check while sitting down and had a BP of 75/49 with a heart rate of 51.  Patient denies any falls, shortness of breath, or chest pain.  She did take her metoprolol today along with her Lasix.  I discussed reasons to go to the ED.  Scheduled appointment at clinic on Tuesday at 3:10 PM and advised patient to hold metoprolol Monday and Tuesday and check BP one time in the morning and afternoon both days.  Harriet Butte, [place], PGY-3

## 2018-10-11 ENCOUNTER — Encounter: Payer: Self-pay | Admitting: Internal Medicine

## 2018-10-12 ENCOUNTER — Other Ambulatory Visit: Payer: Self-pay

## 2018-10-12 ENCOUNTER — Ambulatory Visit (INDEPENDENT_AMBULATORY_CARE_PROVIDER_SITE_OTHER): Payer: Medicare HMO | Admitting: Family Medicine

## 2018-10-12 VITALS — BP 140/70 | HR 102 | Wt 143.0 lb

## 2018-10-12 DIAGNOSIS — Z794 Long term (current) use of insulin: Secondary | ICD-10-CM | POA: Diagnosis not present

## 2018-10-12 DIAGNOSIS — E1165 Type 2 diabetes mellitus with hyperglycemia: Secondary | ICD-10-CM

## 2018-10-12 DIAGNOSIS — E1169 Type 2 diabetes mellitus with other specified complication: Secondary | ICD-10-CM | POA: Diagnosis not present

## 2018-10-12 DIAGNOSIS — E785 Hyperlipidemia, unspecified: Secondary | ICD-10-CM

## 2018-10-12 DIAGNOSIS — I5023 Acute on chronic systolic (congestive) heart failure: Secondary | ICD-10-CM

## 2018-10-12 DIAGNOSIS — E0840 Diabetes mellitus due to underlying condition with diabetic neuropathy, unspecified: Secondary | ICD-10-CM | POA: Diagnosis not present

## 2018-10-12 DIAGNOSIS — E0865 Diabetes mellitus due to underlying condition with hyperglycemia: Secondary | ICD-10-CM | POA: Diagnosis not present

## 2018-10-12 DIAGNOSIS — E1100 Type 2 diabetes mellitus with hyperosmolarity without nonketotic hyperglycemic-hyperosmolar coma (NKHHC): Secondary | ICD-10-CM | POA: Diagnosis not present

## 2018-10-12 DIAGNOSIS — IMO0002 Reserved for concepts with insufficient information to code with codable children: Secondary | ICD-10-CM

## 2018-10-12 DIAGNOSIS — E1142 Type 2 diabetes mellitus with diabetic polyneuropathy: Secondary | ICD-10-CM

## 2018-10-12 MED ORDER — INSULIN PEN NEEDLE 33G X 4 MM MISC: 1.0000 [IU] | Freq: Every day | 6 refills | Status: AC

## 2018-10-12 MED ORDER — ATORVASTATIN CALCIUM 80 MG PO TABS: 80.0000 mg | ORAL_TABLET | Freq: Every morning | ORAL | 0 refills | Status: DC

## 2018-10-12 MED ORDER — GLUCOSE BLOOD VI STRP: ORAL_STRIP | 12 refills | Status: DC

## 2018-10-12 MED ORDER — ONETOUCH ULTRASOFT LANCETS MISC: 12 refills | Status: AC

## 2018-10-12 NOTE — Patient Instructions (Addendum)
   Please take 1/2 tablet of metoprolol every morning (25 mg total)  Continue lasix 80 mg daily  We'll check labs today  Continue checking your blood pressures once a day AND if you feel lightheaded check it standing  Let us know if you have any issues with the diabetes test strips/lancets and pen needles  And please follow up with cardiology- I'll send a message to your doctor.

## 2018-10-12 NOTE — Telephone Encounter (Signed)
Received fax from pharmacy requesting 90 day supply is appropriate. Salvatore Marvel, CMA

## 2018-10-12 NOTE — Progress Notes (Signed)
    Subjective:    Patient ID: [name], female    DOB: 1955-11-08, 63 y.o.   MRN: [id number]   CC: dizziness  HPI: reports she had dizziness on Sunday that was worse with standing and walking around. She took BP which was low standing (90's/60's). She called the after hours line and spoke with PCP who was on call. He instructed her to hold metoprolol for several days and scheduled her to come in today. She was taking 100 mg metoprolol daily which was recently increased from her dose of 50 daily. She has been monitoring BP which has been 150's/90's without metop sitting and standing. She reports she feels better overall but still has some lightheadedness occasionally. She reports she is eating and drinking normally with normal urine output.  She reports her sugars have been in the 300 range which is typical for her. She is unsure if she is injecting insulin properly as she is using an old pen needle repeatedly over the past several months. She also needs new lancets and test strips.   She reports taking 80 mg lasix daily but states her weight is up. She states her dry weight is 128 and she was told to call if she is 3 pounds over that. She reports she's been 140's for the past several weeks but is unable to see cardiologist due to coronavirus pandemic. She reports some shorntess of breath with activity. No chest pain and no orthopnea. She has minimal swelling around her ankles which is normal for her.   Smoking status reviewed- former smoker  Review of Systems- see HPI   Objective:  BP 140/70 Comment: standing  Pulse (!) 102   Wt 143 lb (64.9 kg)   SpO2 99%   BMI 26.16 kg/m  Vitals and nursing note reviewed  General: well nourished, in no acute distress HEENT: normocephalic, MMM Cardiac: RRR, clear S1 and S2, no murmurs, rubs, or gallops Respiratory: clear to auscultation bilaterally, no increased work of breathing Abdomen: soft, nontender Extremities: trace edema bilaterally   Neuro: alert and oriented, no focal deficits   Assessment & Plan:    Acute on chronic heart failure (HCC)  Weight today 143, patient taking 80 mg lasix daily. Does not appear overtly fluid overloaded on exam and given recent issues with hypotension will hold off on increasing. Continue current dose, check CMP, follow up 3 days with [place].   BP normal today standing and sitting. Pulse slightly high as she has been off beta blocker for several days. Will restart metoprolol at 25 mg daily and see how she does, follow up appt made for Friday. Asked patient to monitor BP and call us if any issues before then.  Will forward note to cardiology.   Diabetes mellitus due to underlying condition, uncontrolled, with diabetic neuropathy, with long-term current use of insulin (HCC)   Refilled lancets, test strips, and pen needles    Return in about 3 days (around 10/15/2018).   Lucila Maine, DO Family Medicine Resident PGY-3

## 2018-10-12 NOTE — Assessment & Plan Note (Signed)
  Refilled lancets, test strips, and pen needles

## 2018-10-12 NOTE — Assessment & Plan Note (Addendum)
  Weight today 143, patient taking 80 mg lasix daily. Does not appear overtly fluid overloaded on exam and given recent issues with hypotension will hold off on increasing. Continue current dose, check CMP, follow up 3 days with [place].   BP normal today standing and sitting. Pulse slightly high as she has been off beta blocker for several days. Will restart metoprolol at 25 mg daily and see how she does, follow up appt made for Friday. Asked patient to monitor BP and call us if any issues before then.  Will forward note to cardiology.

## 2018-10-13 ENCOUNTER — Encounter (HOSPITAL_COMMUNITY): Payer: Self-pay

## 2018-10-13 ENCOUNTER — Emergency Department (HOSPITAL_COMMUNITY)
Admission: EM | Admit: 2018-10-13 | Discharge: 2018-10-13 | Disposition: A | Discharge status: Home / Self Care | Payer: Medicare HMO | Attending: Emergency Medicine | Admitting: Emergency Medicine

## 2018-10-13 ENCOUNTER — Telehealth: Payer: Self-pay | Admitting: Family Medicine

## 2018-10-13 ENCOUNTER — Other Ambulatory Visit: Payer: Self-pay

## 2018-10-13 DIAGNOSIS — Z794 Long term (current) use of insulin: Secondary | ICD-10-CM | POA: Insufficient documentation

## 2018-10-13 DIAGNOSIS — E1165 Type 2 diabetes mellitus with hyperglycemia: Secondary | ICD-10-CM | POA: Insufficient documentation

## 2018-10-13 DIAGNOSIS — Z79899 Other long term (current) drug therapy: Secondary | ICD-10-CM | POA: Insufficient documentation

## 2018-10-13 DIAGNOSIS — J449 Chronic obstructive pulmonary disease, unspecified: Secondary | ICD-10-CM | POA: Insufficient documentation

## 2018-10-13 DIAGNOSIS — I110 Hypertensive heart disease with heart failure: Secondary | ICD-10-CM | POA: Diagnosis not present

## 2018-10-13 DIAGNOSIS — F3181 Bipolar II disorder: Secondary | ICD-10-CM | POA: Diagnosis not present

## 2018-10-13 DIAGNOSIS — I5042 Chronic combined systolic (congestive) and diastolic (congestive) heart failure: Secondary | ICD-10-CM | POA: Diagnosis not present

## 2018-10-13 DIAGNOSIS — R739 Hyperglycemia, unspecified: Secondary | ICD-10-CM | POA: Diagnosis present

## 2018-10-13 DIAGNOSIS — Z87891 Personal history of nicotine dependence: Secondary | ICD-10-CM | POA: Insufficient documentation

## 2018-10-13 DIAGNOSIS — F419 Anxiety disorder, unspecified: Secondary | ICD-10-CM | POA: Diagnosis not present

## 2018-10-13 DIAGNOSIS — R69 Illness, unspecified: Secondary | ICD-10-CM | POA: Diagnosis not present

## 2018-10-13 LAB — CBC
HCT: 35.5 % — ABNORMAL LOW (ref 36.0–46.0)
Hemoglobin: 11.6 g/dL — ABNORMAL LOW (ref 12.0–15.0)
MCH: 29.4 pg (ref 26.0–34.0)
MCHC: 32.7 g/dL (ref 30.0–36.0)
MCV: 90.1 fL (ref 80.0–100.0)
Platelets: 268 10*3/uL (ref 150–400)
RBC: 3.94 MIL/uL (ref 3.87–5.11)
RDW: 13.2 % (ref 11.5–15.5)
WBC: 9.1 10*3/uL (ref 4.0–10.5)
nRBC: 0 % (ref 0.0–0.2)

## 2018-10-13 LAB — COMPREHENSIVE METABOLIC PANEL
ALT: 44 IU/L — ABNORMAL HIGH (ref 0–32)
AST: 54 IU/L — ABNORMAL HIGH (ref 0–40)
Albumin/Globulin Ratio: 1.4 (ref 1.2–2.2)
Albumin: 4.1 g/dL (ref 3.8–4.8)
Alkaline Phosphatase: 213 IU/L — ABNORMAL HIGH (ref 39–117)
BUN/Creatinine Ratio: 25 (ref 12–28)
BUN: 25 mg/dL (ref 8–27)
Bilirubin Total: 0.4 mg/dL (ref 0.0–1.2)
CO2: 26 mmol/L (ref 20–29)
Calcium: 9.5 mg/dL (ref 8.7–10.3)
Chloride: 89 mmol/L — ABNORMAL LOW (ref 96–106)
Creatinine, Ser: 1 mg/dL (ref 0.57–1.00)
GFR calc Af Amer: 69 mL/min/{1.73_m2} (ref >59)
GFR calc non Af Amer: 60 mL/min/{1.73_m2} (ref >59)
Globulin, Total: 3 g/dL (ref 1.5–4.5)
Glucose: 624 mg/dL (ref 65–99)
Potassium: 5.1 mmol/L (ref 3.5–5.2)
Sodium: 133 mmol/L — ABNORMAL LOW (ref 134–144)
Total Protein: 7.1 g/dL (ref 6.0–8.5)

## 2018-10-13 LAB — BASIC METABOLIC PANEL
Anion gap: 12 (ref 5–15)
BUN: 28 mg/dL — ABNORMAL HIGH (ref 8–23)
CO2: 31 mmol/L (ref 22–32)
Calcium: 9.9 mg/dL (ref 8.9–10.3)
Chloride: 96 mmol/L — ABNORMAL LOW (ref 98–111)
Creatinine, Ser: 0.98 mg/dL (ref 0.44–1.00)
GFR calc Af Amer: >60 mL/min (ref >60)
GFR calc non Af Amer: >60 mL/min (ref >60)
Glucose, Bld: 81 mg/dL (ref 70–99)
Potassium: 3.6 mmol/L (ref 3.5–5.1)
Sodium: 139 mmol/L (ref 135–145)

## 2018-10-13 LAB — CBG MONITORING, ED: Glucose-Capillary: 87 mg/dL (ref 70–99)

## 2018-10-13 MED ORDER — INSULIN ASPART 100 UNIT/ML ~~LOC~~ SOLN: 4.0000 [IU] | Freq: Two times a day (BID) | SUBCUTANEOUS | Status: DC

## 2018-10-13 MED ORDER — INSULIN DEGLUDEC 100 UNIT/ML ~~LOC~~ SOPN: 26.0000 [IU] | PEN_INJECTOR | Freq: Every day | SUBCUTANEOUS | 3 refills | Status: DC

## 2018-10-13 NOTE — ED Provider Notes (Signed)
[facility] EMERGENCY DEPARTMENT Provider Note   CSN: [id number] Arrival date & time: 10/13/18  0451    History   Chief Complaint Chief Complaint - abnormal labs HPI [name] is a 63 y.o. female.     HPI Patient presents for abnormal labs.  She was seen by her PCP yesterday as an outpatient visit.  There was concern for poorly controlled diabetes, therefore labs were ordered.  Her Labs resulted and her PCP called her tonight.  Apparently the labs reveal hyperglycemia, hyperkalemia and an anion gap.  Patient has given herself insulin last night.  She denies any acute complaints this time.  Her course is stable.  No fever/vomiting.  No chest pain or cough. Past Medical History:  Diagnosis Date  . Allergy   . Anxiety   . Aortic insufficiency    a. mild-mod by echo 07/2018.  [name] Arthritis   . Bipolar 2 disorder ([place])   . Cataract   . Chest pain   . Chronic combined systolic and diastolic CHF (congestive heart failure) ([place])   . Depression   . DM (diabetes mellitus), type 2 ([place])   . GERD (gastroesophageal reflux disease)   . HTN (hypertension)   . Hypercholesteremia   . Marijuana abuse   . Moderate mitral regurgitation   . Moderate tricuspid regurgitation   . NICM (nonischemic cardiomyopathy) ([place])   . Renal insufficiency   . Trigger finger    r index inj 12/08, bilateral thumb inj 11/09, r thumb and dequervain's inj 01/2009    Patient Active Problem List   Diagnosis Date Noted  . Hip pain 09/29/2018  . Hand pain, left 09/29/2018  . Transaminitis 09/29/2018  . Acute on chronic heart failure ([place]) 09/14/2018  . Hyperkalemia 08/11/2018  . Diabetes mellitus type 2 in nonobese (HCC)   . Bipolar disorder in full remission ([place])   . Malnutrition of moderate degree 07/23/2018  . Trigger finger 03/04/2018  . Diabetes mellitus due to underlying condition, uncontrolled, with diabetic neuropathy, with long-term current use of insulin ([place]) 03/04/2018  .  Healthcare maintenance 10/29/2017  . COPD with emphysema ([place]) 02/06/2017  . 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 Poor appetite 10/04/2015  . Personal history of colonic polyps 09/13/2015  . Tardive dyskinesia 06/19/2014  . Cataracts, bilateral 03/21/2014  . Chronic diastolic CHF (congestive heart failure) ([place]) 07/26/2013  . Primary hypertension 08/12/2012  . Other bipolar disorder ([place]) 08/23/2009  . Fibromyalgia 08/06/2009  . Gastroparesis 03/22/2009  . Peripheral autonomic neuropathy due to DM ([place]) 01/29/2007  . Tobacco use disorder 08/18/2006  . Hyperlipidemia due to type 2 diabetes mellitus ([place]) 08/13/2006  . Depression, major, recurrent ([place]) 08/13/2006  . GERD (gastroesophageal reflux disease) 08/13/2006    Past Surgical History:  Procedure Laterality Date  . ABDOMINAL HYSTERECTOMY  1986   fibroids  . CARDIAC CATHETERIZATION  05/06/2004   normal LV fxn EF>60%  . CYSTECTOMY     back of head  . LEFT HEART CATH AND CORONARY ANGIOGRAPHY N/A 07/26/2018   Procedure: LEFT HEART CATH AND CORONARY ANGIOGRAPHY;  Surgeon: Sherren Mocha, MD;  Location: [facility] CV LAB;  Service: Cardiovascular;  Laterality: N/A;  . LEFT HEART CATHETERIZATION WITH CORONARY ANGIOGRAM N/A 05/26/2011   Procedure: LEFT HEART CATHETERIZATION WITH CORONARY ANGIOGRAM;  Surgeon: Larey Dresser, MD;  Location: [facility] CATH LAB;  Service: Cardiovascular;  Laterality: N/A;  radial  . perirpheral iridectomy  02/12/2005  . TEE WITHOUT CARDIOVERSION N/A 08/05/2018  Procedure: TRANSESOPHAGEAL ECHOCARDIOGRAM (TEE);  Surgeon: Josue Hector, MD;  Location: [facility] ENDOSCOPY;  Service: Cardiovascular;  Laterality: N/A;     OB History   No obstetric history on file.      Home Medications    Prior to Admission medications   Medication Sig Start Date End Date Taking? Authorizing Provider  acetaminophen (TYLENOL) 500 MG tablet Take 1,000 mg by mouth every 6 (six) hours as needed for mild pain.     [provider]  atorvastatin (LIPITOR) 80 MG tablet Take 1 tablet (80 mg total) by mouth every morning. 10/12/18   Fallis Bing, DO  Blood Glucose Monitoring Suppl (ONE TOUCH ULTRA 2) W/DEVICE KIT 1 kit by Does not apply route once. 04/11/15   Karamalegos, Devonne Doughty, DO  famotidine (PEPCID) 40 MG tablet Take 1 tablet (40 mg total) by mouth daily. 09/29/18   Kinnie Feil, MD  feeding supplement, ENSURE ENLIVE, (ENSURE ENLIVE) LIQD Take 237 mLs by mouth 3 (three) times daily between meals. 07/27/18   Benay Pike, MD  furosemide (LASIX) 80 MG tablet Take 1 tablet (80 mg total) by mouth daily. 09/21/18   [place] Bing, DO  glucose blood (ONE TOUCH ULTRA TEST) test strip USE 1 STRIP TO CHECK GLUCOSE THREE TIMES DAILY 10/12/18   Lucila Maine C, DO  insulin aspart (NOVOLOG) 100 UNIT/ML injection Inject 4-8 Units into the skin 2 (two) times daily with a meal. 10/13/18   Tammi Klippel, Sherin, DO  insulin degludec (TRESIBA FLEXTOUCH) 100 UNIT/ML SOPN FlexTouch Pen Inject 0.26 mLs (26 Units total) into the skin daily. 10/13/18   Caroline More, DO  Insulin Pen Needle 33G X 4 MM MISC 1 Units by Does not apply route 5 (five) times daily. 10/12/18   Steve Rattler, DO  Lancets (ONETOUCH ULTRASOFT) lancets Use as instructed 10/12/18   Steve Rattler, DO  metoprolol succinate (TOPROL-XL) 50 MG 24 hr tablet Take 2 tablet by mouth in the a.m, take 1 tablet by mouth in the p.m Take with or immediately following a meal. 09/15/18   Dunn, Dayna N, PA-C  mirtazapine (REMERON) 15 MG tablet Take 1 tablet (15 mg total) by mouth at bedtime. Patient not taking: Reported on 09/29/2018 07/30/18   Nuala Alpha, DO  nicotine (NICODERM CQ - DOSED IN MG/24 HOURS) 21 mg/24hr patch Place 1 patch (21 mg total) onto the skin daily. Patient not taking: Reported on 09/29/2018 07/30/18   Nuala Alpha, DO  ONE TOUCH LANCETS MISC 1 Device by Does not apply route 3 (three) times daily. 09/22/13   Melony Overly, MD     Family History Family History  Problem Relation Age of Onset  . Diabetes Mother   . Heart disease Mother   . Heart attack Mother   . Bipolar disorder Mother   . Diabetes Father   . Bipolar disorder Sister   . Diabetes Sister   . Bipolar disorder Brother   . Diabetes Brother   . Asthma Son   . Diabetes Sister   . Diabetes Sister   . Hypertension Sister   . Diabetes Sister   . Diabetes Sister   . Diabetes Sister   . Diabetes Brother   . Diabetes Brother   . Diabetes Brother   . Asthma Son   . Colon cancer Neg Hx   . Esophageal cancer Neg Hx     Social History Social History   Tobacco Use  . Smoking status: Former Smoker  Packs/day: 0.50    Years: 43.00    Pack years: 21.50    Types: Cigarettes  . Smokeless tobacco: Never Used  Substance Use Topics  . Alcohol use: Yes    Alcohol/week: 0.0 standard drinks    Comment: drinks socially occaisionally  . Drug use: Not Currently     Allergies   Ibuprofen and Lisinopril   Review of Systems Review of Systems  Constitutional: Negative for fever.  Respiratory: Negative for shortness of breath.   Cardiovascular: Negative for chest pain.  Gastrointestinal: Negative for vomiting.  All other systems reviewed and are negative.    Physical Exam Updated Vital Signs BP (!) 168/86   Pulse 68   Temp 98 F (36.7 C)   Resp 16   Ht 1.575 m (_0 )   Wt 64.9 kg   SpO2 96%   BMI 26.16 kg/m   Physical Exam CONSTITUTIONAL: Well developed/well nourished HEAD: Normocephalic/atraumatic EYES: EOMI NECK: supple no meningeal signs SPINE/BACK:entire spine nontender CV: S1/S2 noted, no murmurs/rubs/gallops noted LUNGS: Lungs are clear to auscultation bilaterally, no apparent distress ABDOMEN: soft, nontender NEURO: Pt is awake/alert/appropriate, moves all extremitiesx4.  No facial droop.   EXTREMITIES: pulses normal/equal, full ROM SKIN: warm, color normal PSYCH: no abnormalities of mood noted, alert and oriented to  situation   ED Treatments / Results  Labs (all labs ordered are listed, but only abnormal results are displayed) Labs Reviewed  BASIC METABOLIC PANEL - Abnormal; Notable for the following components:      Result Value   Chloride 96 (*)    BUN 28 (*)    All other components within normal limits  CBC - Abnormal; Notable for the following components:   Hemoglobin 11.6 (*)    HCT 35.5 (*)    All other components within normal limits  CBG MONITORING, ED    EKG None  Radiology No results found.  Procedures Procedures  Medications Ordered in ED Medications - No data to display   Initial Impression / Assessment and Plan / ED Course  I have reviewed the triage vital signs and the nursing notes.  Pertinent labs results that were available during my care of the patient were reviewed by me and considered in my medical decision making (see chart for details).        5:17 AM Telephone note from 4 AM reveals that patient was called in by family practice due to hyperglycemia with anion gap At this time patient is asymptomatic.  Her glucose at this time is 23 We will check labs and reassess. 6:21 AM Hyperglycemia has resolved.  There is no anion gap.  Labs otherwise look at baseline.  She has no other complaints.  Will discharge home Final Clinical Impressions(s) / ED Diagnoses   Final diagnoses:  Hyperglycemia, unspecified    ED Discharge Orders    None       Ripley Fraise, MD 10/13/18 [phone]

## 2018-10-13 NOTE — Telephone Encounter (Signed)
**  After Hours/ Emergency Line Call**  Received a call to report that [name] had critical lab value. Call from [facility] stating patient had glucose elevated to 624. Other labs in CMP include the following: Na 133, K 5.1, Cl 89, CO2 26, Ca 9.5, Prot 7.1, Alb 4.1, bili 0.4. Anion gap calculated to 18 (wide anion gap).  Called patient given wide anion gap and elevated glucose. Patient states that recently her novolog needle has not been working. she has not been able to give herself insulin properly for past several months. She states she has also been using the same needle for tresiba, so she assumes she has not had proper tresiba dosing as well. Was seen by Dr. Vanetta Shawl in clinic on 4/28 for dizziness. Denies fatigue, nausea, or vomiting. Does report cough.  Home meds: novolog 4-8U bid, tresiba 26U daily. Has not received either of these properly for months.  Last A1C 10.6 on 07/23/18, so uncontrolled.   Recommended that given wide anion gap and elevated glucose without ability to give herself insulin at home patient should be seen in ED for evaluation of DKA vs. HHS. Discussed with patient importance of treatment of this. Patient will likely need repeat BMP, and if continues to have wide anion gap with elevated glucose can consider admission for insulin gtt. Hopeful that patient will have normal labs on re-check and can be discharged after fluid hydration and ensure close follow up with PCP. Will also send refill for novolog and tresiba given that her pens are broken at home. Red flags discussed.  Patient states her husband will drive her to ED. Will forward to PCP.  Caroline More, DO PGY-2, [facility] 10/13/2018 3:57 AM

## 2018-10-13 NOTE — Progress Notes (Signed)
Patient has appt Friday to follow up on feeling dizzy and fluid status. We restarted metoprolol at visit 4/28 at low dose of 25 mg daily, will see how BP and HR are doing on that and her symptoms. She reported a weight gain but she did not appear fluid overloaded on exam, kept lasix dose the same pending labs. Her labs reveal elevated glucose and her regimen should be altered Friday as hypergylcemia seems to be a common issue for her. Sodium mildly low. Her AST/ALT and alk phos still elevated. Nothing overt to explain dizziness. Could push lasix more if needed on appt Friday.

## 2018-10-13 NOTE — ED Notes (Signed)
Pt reports she was contacted by her primary care office and told to go to ED due to abnormal lab values, including elevated blood glucose in 600's. Pt denies any complaints. Pt's current glucose level 87. Pt with stable vital signs. Will continue to monitor.

## 2018-10-15 ENCOUNTER — Other Ambulatory Visit: Payer: Self-pay

## 2018-10-15 ENCOUNTER — Ambulatory Visit (INDEPENDENT_AMBULATORY_CARE_PROVIDER_SITE_OTHER): Payer: Medicare HMO | Admitting: Family Medicine

## 2018-10-15 VITALS — BP 172/94 | HR 100

## 2018-10-15 DIAGNOSIS — I950 Idiopathic hypotension: Secondary | ICD-10-CM | POA: Diagnosis not present

## 2018-10-15 DIAGNOSIS — I10 Essential (primary) hypertension: Secondary | ICD-10-CM

## 2018-10-15 DIAGNOSIS — E119 Type 2 diabetes mellitus without complications: Secondary | ICD-10-CM

## 2018-10-15 DIAGNOSIS — E785 Hyperlipidemia, unspecified: Secondary | ICD-10-CM | POA: Diagnosis not present

## 2018-10-15 DIAGNOSIS — E1169 Type 2 diabetes mellitus with other specified complication: Secondary | ICD-10-CM

## 2018-10-15 DIAGNOSIS — H8110 Benign paroxysmal vertigo, unspecified ear: Secondary | ICD-10-CM | POA: Diagnosis not present

## 2018-10-15 MED ORDER — INSULIN DEGLUDEC 100 UNIT/ML ~~LOC~~ SOPN: 30.0000 [IU] | PEN_INJECTOR | Freq: Every day | SUBCUTANEOUS | 3 refills | Status: DC

## 2018-10-15 NOTE — Patient Instructions (Addendum)
It was great to see you today! Thank you for letting me participate in your care!  Today, we discussed your recent episode of dizziness and low blood pressure. These could be due to several things. I am concerned you may have orthostatic hypotension. We will slowly go back up on your metoprolol to get you to a dose that you can tolerate. Please continue taking 50mg  for now. The dizziness may also be due to BPPV or vertigo. I have included more info on that below. I will have you go to physical therapy to treat this.   To get better glucose control I have increased your Tresiba by 4U daily, so please take 30U daily now every day and continue to keep a log of what your blood glucose is doing.   Benign Positional Vertigo Vertigo is the feeling that you or your surroundings are moving when they are not. Benign positional vertigo is the most common form of vertigo. This is usually a harmless condition (benign). This condition is positional. This means that symptoms are triggered by certain movements and positions. This condition can be dangerous if it occurs while you are doing something that could cause harm to you or others. This includes activities such as driving or operating machinery. What are the causes? In many cases, the cause of this condition is not known. It may be caused by a disturbance in an area of the inner ear that helps your brain to sense movement and balance. This disturbance can be caused by:  Viral infection (labyrinthitis).  Head injury.  Repetitive motion, such as jumping, dancing, or running. What increases the risk? You are more likely to develop this condition if:  You are a woman.  You are 53 years of age or older. What are the signs or symptoms? Symptoms of this condition usually happen when you move your head or your eyes in different directions. Symptoms may start suddenly, and usually last for less than a minute. They include:  Loss of balance and  falling.  Feeling like you are spinning or moving.  Feeling like your surroundings are spinning or moving.  Nausea and vomiting.  Blurred vision.  Dizziness.  Involuntary eye movement (nystagmus). Symptoms can be mild and cause only minor problems, or they can be severe and interfere with daily life. Episodes of benign positional vertigo may return (recur) over time. Symptoms may improve over time. How is this diagnosed? This condition may be diagnosed based on:  Your medical history.  Physical exam of the head, neck, and ears.  Tests, such as: ? MRI. ? CT scan. ? Eye movement tests. Your health care provider may ask you to change positions quickly while he or she watches you for symptoms of benign positional vertigo, such as nystagmus. Eye movement may be tested with a variety of exams that are designed to evaluate or stimulate vertigo. ? An electroencephalogram (EEG). This records electrical activity in your brain. ? Hearing tests. You may be referred to a health care provider who specializes in ear, nose, and throat (ENT) problems (otolaryngologist) or a provider who specializes in disorders of the nervous system (neurologist). How is this treated?  This condition may be treated in a session in which your health care provider moves your head in specific positions to adjust your inner ear back to normal. Treatment for this condition may take several sessions. Surgery may be needed in severe cases, but this is rare. In some cases, benign positional vertigo may resolve on its  own in 2-4 weeks. Follow these instructions at home: Safety  Move slowly. Avoid sudden body or head movements or certain positions, as told by your health care provider.  Avoid driving until your health care provider says it is safe for you to do so.  Avoid operating heavy machinery until your health care provider says it is safe for you to do so.  Avoid doing any tasks that would be dangerous to you or  others if vertigo occurs.  If you have trouble walking or keeping your balance, try using a cane for stability. If you feel dizzy or unstable, sit down right away.  Return to your normal activities as told by your health care provider. Ask your health care provider what activities are safe for you. General instructions  Take over-the-counter and prescription medicines only as told by your health care provider.  Drink enough fluid to keep your urine pale yellow.  Keep all follow-up visits as told by your health care provider. This is important. Contact a health care provider if:  You have a fever.  Your condition gets worse or you develop new symptoms.  Your family or friends notice any behavioral changes.  You have nausea or vomiting that gets worse.  You have numbness or a "pins and needles" sensation. Get help right away if you:  Have difficulty speaking or moving.  Are always dizzy.  Faint.  Develop severe headaches.  Have weakness in your legs or arms.  Have changes in your hearing or vision.  Develop a stiff neck.  Develop sensitivity to light. Summary  Vertigo is the feeling that you or your surroundings are moving when they are not. Benign positional vertigo is the most common form of vertigo.  The cause of this condition is not known. It may be caused by a disturbance in an area of the inner ear that helps your brain to sense movement and balance.  Symptoms include loss of balance and falling, feeling that you or your surroundings are moving, nausea and vomiting, and blurred vision.  This condition can be diagnosed based on symptoms, physical exam, and other tests, such as MRI, CT scan, eye movement tests, and hearing tests.  Follow safety instructions as told by your health care provider. You will also be told when to contact your health care provider in case of problems. This information is not intended to replace advice given to you by your health care  provider. Make sure you discuss any questions you have with your health care provider. Document Released: 03/10/2006 Document Revised: 11/11/2017 Document Reviewed: 11/11/2017 Elsevier Interactive Patient Education  2019 [place] [employer], Harolyn Rutherford, DO PGY-2, [facility]

## 2018-10-15 NOTE — Progress Notes (Signed)
Subjective: Chief Complaint  Patient presents with  . Follow Up Heart Failure     HPI: [name] is a 63 y.o. presenting to clinic today to discuss the following:  ED Follow up Patient was sent to the ED on 4/29 after her labs came back with hyperglycemia and an anion gap concerning for DKA/HHS. She was seen and evaluated in the ED and not found to have an anion gap and her blood glucose was 139 on BMP with no anion gap. She did take Lantus at home before she came to the ED. She think something was wrong with her needles and she wasn't really getting the insulin that she thought she was taking and that is why her sugars were so high. She has now gotten more injection needles and states her home CBGs have improved.  She did have an episode of hypotension with dizziness at home. She had associated nausea and felt like the room was spinning. Her glucose at that time was 490. She also got lightheaded and sat down and her symptoms improved.     ROS noted in HPI.   Past Medical, Surgical, Social, and Family History Reviewed & Updated per EMR.   Pertinent Historical Findings include:   Social History   Tobacco Use  Smoking Status Former Smoker  . Packs/day: 0.50  . Years: 43.00  . Pack years: 21.50  . Types: Cigarettes  Smokeless Tobacco Never Used      Objective: BP (!) 172/94   Pulse 100   SpO2 99%  Vitals and nursing notes reviewed  Physical Exam Gen: Alert and Oriented x 3, NAD HEENT: Normocephalic, atraumatic, PERRLA, EOMI Neck: trachea midline, no thyroidmegaly, no LAD CV: RRR, no murmurs, normal S1, S2 split Resp: CTAB, no wheezing, rales, or rhonchi, comfortable work of breathing MSK: Moves all four extremities Ext: no clubbing, cyanosis, or edema Skin: warm, dry, intact, no rashes  Results for orders placed or performed during the hospital encounter of 10/13/18 (from the past 72 hour(s))  CBG monitoring, ED     Status: None   Collection Time:  10/13/18  5:01 AM  Result Value Ref Range   Glucose-Capillary 87 70 - 99 mg/dL  Basic metabolic panel     Status: Abnormal   Collection Time: 10/13/18  5:05 AM  Result Value Ref Range   Sodium 139 135 - 145 mmol/L   Potassium 3.6 3.5 - 5.1 mmol/L   Chloride 96 (L) 98 - 111 mmol/L   CO2 31 22 - 32 mmol/L   Glucose, Bld 81 70 - 99 mg/dL   BUN 28 (H) 8 - 23 mg/dL   Creatinine, Ser 0.98 0.44 - 1.00 mg/dL   Calcium 9.9 8.9 - 10.3 mg/dL   GFR calc non Af Amer >60 >60 mL/min   GFR calc Af Amer >60 >60 mL/min   Anion gap 12 5 - 15    Comment: Performed at [facility], [place] [address] [place], [place] [address]  CBC     Status: Abnormal   Collection Time: 10/13/18  5:05 AM  Result Value Ref Range   WBC 9.1 4.0 - 10.5 K/uL   RBC 3.94 3.87 - 5.11 MIL/uL   Hemoglobin 11.6 (L) 12.0 - 15.0 g/dL   HCT 35.5 (L) 36.0 - 46.0 %   MCV 90.1 80.0 - 100.0 fL   MCH 29.4 26.0 - 34.0 pg   MCHC 32.7 30.0 - 36.0 g/dL   RDW 13.2 11.5 -  15.5 %   Platelets 268 150 - 400 K/uL   nRBC 0.0 0.0 - 0.2 %    Comment: Performed at [facility], [place] [address] [place],  [address]    Assessment/Plan:  Idiopathic hypotension Orthostatic vitals normal here in office. Unclear what the cause of her hypotension was as she took it at home and it was low (80s/50s) and she was symptomatic. - Possible dehydration from hyperglycemia - Hydrate with care as she does have heart failure  Diabetes mellitus type 2 in nonobese [facility]) Poorly controlled recently possibly due to an unintended administration error  - Increased Tresiba to 30U up from 26U BID. - A1c in 3 months - Bring glucose diary to next appointment  Benign paroxysmal positional vertigo Unclear if this was the cause of some of her symptoms of dizziness and lightheadedness but given her symptoms I do feel she has an element of BPPV and we should treat - Ambulatory referral to PT vestibular rehab  Primary hypertension Had episode of  hypotension but hypertensive today and at home readings. - Cont with Metoprolol 50mg  daily and titrate up if no more episodes of hypotension   PATIENT EDUCATION PROVIDED: See AVS    Diagnosis and plan along with any newly prescribed medication(s) were discussed in detail with this patient today. The patient verbalized understanding and agreed with the plan. Patient advised if symptoms worsen return to clinic or ER.    Orders Placed This Encounter  Procedures  . PT vestibular rehab    Standing Status:   Future    Standing Expiration Date:   10/15/2019    Meds ordered this encounter  Medications  . insulin degludec (TRESIBA FLEXTOUCH) 100 UNIT/ML SOPN FlexTouch Pen    Sig: Inject 0.3 mLs (30 Units total) into the skin daily.    Dispense:  15 mL    Refill:  [place], DO 10/15/2018, 10:08 AM PGY-2 [place]

## 2018-10-19 DIAGNOSIS — H8110 Benign paroxysmal vertigo, unspecified ear: Secondary | ICD-10-CM | POA: Insufficient documentation

## 2018-10-19 DIAGNOSIS — I950 Idiopathic hypotension: Secondary | ICD-10-CM | POA: Insufficient documentation

## 2018-10-19 NOTE — Assessment & Plan Note (Signed)
Unclear if this was the cause of some of her symptoms of dizziness and lightheadedness but given her symptoms I do feel she has an element of BPPV and we should treat - Ambulatory referral to PT vestibular rehab

## 2018-10-19 NOTE — Assessment & Plan Note (Signed)
Orthostatic vitals normal here in office. Unclear what the cause of her hypotension was as she took it at home and it was low (80s/50s) and she was symptomatic. - Possible dehydration from hyperglycemia - Hydrate with care as she does have heart failure

## 2018-10-19 NOTE — Assessment & Plan Note (Signed)
Poorly controlled recently possibly due to an unintended administration error  - Increased Tresiba to 30U up from 26U BID. - A1c in 3 months - Bring glucose diary to next appointment

## 2018-10-19 NOTE — Assessment & Plan Note (Signed)
Had episode of hypotension but hypertensive today and at home readings. - Cont with Metoprolol 50mg  daily and titrate up if no more episodes of hypotension

## 2018-10-21 ENCOUNTER — Other Ambulatory Visit: Payer: Self-pay

## 2018-10-21 ENCOUNTER — Ambulatory Visit (INDEPENDENT_AMBULATORY_CARE_PROVIDER_SITE_OTHER): Payer: Medicare HMO | Admitting: Family Medicine

## 2018-10-21 VITALS — BP 142/90 | HR 65 | Wt 141.6 lb

## 2018-10-21 DIAGNOSIS — R945 Abnormal results of liver function studies: Secondary | ICD-10-CM

## 2018-10-21 DIAGNOSIS — I5032 Chronic diastolic (congestive) heart failure: Secondary | ICD-10-CM | POA: Diagnosis not present

## 2018-10-21 DIAGNOSIS — E119 Type 2 diabetes mellitus without complications: Secondary | ICD-10-CM | POA: Diagnosis not present

## 2018-10-21 DIAGNOSIS — R7989 Other specified abnormal findings of blood chemistry: Secondary | ICD-10-CM

## 2018-10-21 LAB — POCT GLYCOSYLATED HEMOGLOBIN (HGB A1C): HbA1c, POC (controlled diabetic range): 13.7 % — AB (ref 0.0–7.0)

## 2018-10-21 MED ORDER — CANDESARTAN CILEXETIL 4 MG PO TABS: 4.0000 mg | ORAL_TABLET | Freq: Every day | ORAL | 0 refills | Status: DC

## 2018-10-21 MED ORDER — INSULIN DEGLUDEC 100 UNIT/ML ~~LOC~~ SOPN: 40.0000 [IU] | PEN_INJECTOR | Freq: Every day | SUBCUTANEOUS | 0 refills | Status: DC

## 2018-10-21 MED ORDER — GABAPENTIN 100 MG PO CAPS: 100.0000 mg | ORAL_CAPSULE | Freq: Three times a day (TID) | ORAL | 3 refills | Status: AC

## 2018-10-21 NOTE — Patient Instructions (Addendum)
  Increase to 40 of tresiba every morning I sent in prescription for gabapentin and cadesartan  Please keep appointment with cardiology on 5/28 at 9 am  We'll see you in 1 month to check back in and repeat liver labs, talk about your sugars.  If you have questions or concerns please do not hesitate to call at [phone].  Lucila Maine, DO PGY-3, [facility] 10/21/2018 10:43 AM

## 2018-10-21 NOTE — Progress Notes (Signed)
Subjective:    Patient ID: [name], female    DOB: 1955/11/25, 63 y.o.   MRN: [id number]   CC: follow up DM  HPI: patient was seen recently by me for hypotensive/pre-syncopal episodes on 4/28. Metoprolol was restarted and labs checked. Labs at that time revealed hyperglycemia, pt w/ issue injecting insulin, she went to ED for evaluation.  Repeat labs at ED showed no HHS. Mildly elevated AST/ALT and elevated alk phos.   She then came in for follow up on 5/1 with Dr. Garlan Fillers, her metoprolol was increased back to 50 mg daily and her triseba was increased to 30 units. She was referred to PT for vestibular rehab.  She is here today for follow up on her DM. Has been taking triseba 30 units every morning for the past week. Takes novolog 4-8 units with meals depending on sugars. CBGs have largely been in the 300s, some higher. Lowest was this morning at 73 units, she felt shaky and low at this time. Patient reports poor appetite overall for the past several years, eats a meal a day. She was given mirtazapine to take every night which she only takes intermittently. She is requesting refill of gabapentin for peripheral neuropathy.   She reports she has follow up with cards at the end of the month. She reports her dry weight is 128 pounds and is confused that her weight today is 142 pounds. She states she has minor swelling in her lower extremities. No orthopnea or DOE. She is taking 80 mg lasix daily and 50 mg toprol daily. She also takes candesartan and notes this is not on her med list, she needs a refill of this.  Smoking status reviewed- former smoker  Review of Systems-    Objective:  BP (!) 142/90   Pulse 65   Wt 141 lb 9.6 oz (64.2 kg)   SpO2 97%   BMI 25.90 kg/m  Vitals and nursing note reviewed  General: well nourished, in no acute distress HEENT: normocephalic, MMM Cardiac: RRR, clear S1 and S2, no murmurs, rubs, or gallops Respiratory: clear to auscultation bilaterally,  no increased work of breathing Extremities: no edema or cyanosis. Neuro: alert and oriented, no focal deficits   Assessment & Plan:    Diabetes mellitus type 2 in nonobese (HCC) A1C worsened to 13.7. Reviewing her home CBGs she is averaging in the 300 range with some highs to 500s. She has pen needle supply now to properly inject insulin. Will increase tresiba to 40 units daily. Continue novolog sliding scale. Follow up 1 month to review home CBGs and make further edits as needed, asked patient to call if she experiences low readings before then  Chronic diastolic CHF (congestive heart failure) ([place])  Patient weight today 142 which is consistent with her dry weight- she is insistent her dry weight is 128 but I do not see her anywhere near this weight in the chart for the last several years. She has follow up with cardiology at the end of the month. Refilled candesartan. Continue lasix, toprol  Elevated LFTs  Patient had bump in LFTs in February 2020 during hospitalization for heart failure exacerbation, these came down to normal briefly and are now elevated again. Alk phos also elevated to 213. Patient on lipitor for high cholesterol and ASCVD risk of about 47%, she denies alcohol use, she has no abdominal pain. Discussed monitoring LFTs and obtaining RUQ [place] at follow up visit next month if still elevated. Follow up  1 month.    Return in about 4 weeks (around 11/18/2018) for DM, repeat CMP.   Lucila Maine, DO Family Medicine Resident PGY-3

## 2018-10-22 DIAGNOSIS — R7989 Other specified abnormal findings of blood chemistry: Secondary | ICD-10-CM | POA: Insufficient documentation

## 2018-10-22 NOTE — Assessment & Plan Note (Signed)
A1C worsened to 13.7. Reviewing her home CBGs she is averaging in the 300 range with some highs to 500s. She has pen needle supply now to properly inject insulin. Will increase tresiba to 40 units daily. Continue novolog sliding scale. Follow up 1 month to review home CBGs and make further edits as needed, asked patient to call if she experiences low readings before then

## 2018-10-22 NOTE — Assessment & Plan Note (Signed)
  Patient weight today 142 which is consistent with her dry weight- she is insistent her dry weight is 128 but I do not see her anywhere near this weight in the chart for the last several years. She has follow up with cardiology at the end of the month. Refilled candesartan. Continue lasix, toprol

## 2018-10-22 NOTE — Assessment & Plan Note (Signed)
  Patient had bump in LFTs in February 2020 during hospitalization for heart failure exacerbation, these came down to normal briefly and are now elevated again. Alk phos also elevated to 213. Patient on lipitor for high cholesterol and ASCVD risk of about 47%, she denies alcohol use, she has no abdominal pain. Discussed monitoring LFTs and obtaining RUQ [place] at follow up visit next month if still elevated. Follow up 1 month.

## 2018-10-27 ENCOUNTER — Ambulatory Visit: Payer: Medicare HMO | Attending: Family Medicine | Admitting: Physical Therapy

## 2018-10-27 ENCOUNTER — Encounter: Payer: Self-pay | Admitting: Physical Therapy

## 2018-10-27 ENCOUNTER — Other Ambulatory Visit: Payer: Self-pay

## 2018-10-27 DIAGNOSIS — M25552 Pain in left hip: Secondary | ICD-10-CM

## 2018-10-27 DIAGNOSIS — R262 Difficulty in walking, not elsewhere classified: Secondary | ICD-10-CM | POA: Diagnosis present

## 2018-10-27 DIAGNOSIS — M6281 Muscle weakness (generalized): Secondary | ICD-10-CM | POA: Diagnosis present

## 2018-10-27 NOTE — Therapy (Signed)
[place], [place], [address] Phone: [phone]   Fax:  [phone]  Physical Therapy Evaluation  Patient Details  Name: [name] MRN: [id number] Date of Birth: 12/15/1955 Referring Provider (PT): Andrena Mews, MD   Encounter Date: 10/27/2018  PT End of Session - 10/27/18 1555    Visit Number  1    Number of Visits  12    Date for PT Re-Evaluation  12/08/18    Authorization Type  [employer]    PT Start Time  1102    PT Stop Time  1145    PT Time Calculation (min)  43 min    Activity Tolerance  Patient tolerated treatment well    Behavior During Therapy  [facility] for tasks assessed/performed       Past Medical History:  Diagnosis Date  . Allergy   . Anxiety   . Aortic insufficiency    a. mild-mod by echo 07/2018.  [name] Arthritis   . Bipolar 2 disorder ([place])   . Cataract   . Chest pain   . Chronic combined systolic and diastolic CHF (congestive heart failure) ([place])   . Depression   . DM (diabetes mellitus), type 2 ([place])   . GERD (gastroesophageal reflux disease)   . HTN (hypertension)   . Hypercholesteremia   . Marijuana abuse   . Moderate mitral regurgitation   . Moderate tricuspid regurgitation   . NICM (nonischemic cardiomyopathy) ([place])   . Renal insufficiency   . Trigger finger    r index inj 12/08, bilateral thumb inj 11/09, r thumb and dequervain's inj 01/2009    Past Surgical History:  Procedure Laterality Date  . ABDOMINAL HYSTERECTOMY  1986   fibroids  . CARDIAC CATHETERIZATION  05/06/2004   normal LV fxn EF>60%  . CYSTECTOMY     back of head  . LEFT HEART CATH AND CORONARY ANGIOGRAPHY N/A 07/26/2018   Procedure: LEFT HEART CATH AND CORONARY ANGIOGRAPHY;  Surgeon: Sherren Mocha, MD;  Location: [facility];  Service: Cardiovascular;  Laterality: N/A;  . LEFT HEART CATHETERIZATION WITH CORONARY ANGIOGRAM N/A 05/26/2011   Procedure: LEFT HEART CATHETERIZATION WITH  CORONARY ANGIOGRAM;  Surgeon: Larey Dresser, MD;  Location: [facility] CATH LAB;  Service: Cardiovascular;  Laterality: N/A;  radial  . perirpheral iridectomy  02/12/2005  . TEE WITHOUT CARDIOVERSION N/A 08/05/2018   Procedure: TRANSESOPHAGEAL ECHOCARDIOGRAM (TEE);  Surgeon: Josue Hector, MD;  Location: [facility] ENDOSCOPY;  Service: Cardiovascular;  Laterality: N/A;    There were no vitals filed for this visit.   Subjective Assessment - 10/27/18 1333    Subjective  Pt. is a 63 y/o female referred to PT with c/o left hip pain. She reports onset about 3-4 weeks ago-no overt mechansim of injury but reports onset of "sharp" lateral hip pain with standing. She reports pain had been up to 10/10 but improved with recent injection. Pt. also with some recent vertigo symptoms with BPPV diagnosis pending vestibular PT referral. Pain exacerbated with standing and walking, eased with rest.    Pertinent History  Diabetic, bipolar, anxiety/depression, CHF, history substance abuse    Limitations  Standing;Walking;House hold activities    How long can you stand comfortably?  unable comfortably prior to injection but recently improved    How long can you walk comfortably?  unable comfortably prior to injection but recently improved    Patient Stated Goals  Improve hip pain    Currently in Pain?  No/denies  Pain Score  0-No pain         [facility] PT Assessment - 10/27/18 0001      Assessment   Medical Diagnosis  Left hip pain    Referring Provider (PT)  Andrena Mews, MD    Onset Date/Surgical Date  09/29/18    Prior Therapy  none      Precautions   Precautions  None      Restrictions   Weight Bearing Restrictions  No      Balance Screen   Has the patient fallen in the past 6 months  No      Prior Function   Level of Independence  Independent with community mobility without device      Cognition   Overall Cognitive Status  Within Functional Limits for tasks assessed      Observation/Other Assessments    Focus on Therapeutic Outcomes (FOTO)   55% Limited      Sensation   Light Touch  Appears Intact      ROM / Strength   AROM / PROM / Strength  AROM;PROM;Strength      AROM   Overall AROM Comments  Trunk and bilat. hip AROM grossly WFL bilat. No change in symptoms with lumbar ROM      PROM   Overall PROM Comments  Tightness in left hip external rotators, piriformis otherwise no limitations noted      Strength   Overall Strength Comments  Left hip abd, add, and ext tested in right sidelying, right hip strength for these motions not tested due to difficulty left sidelying position    Strength Assessment Site  Hip;Knee    Right/Left Hip  Right;Left    Right Hip Flexion  4+/5    Right Hip External Rotation   5/5    Right Hip Internal Rotation  5/5    Left Hip Flexion  4-/5    Left Hip Extension  4-/5    Left Hip External Rotation  4/5    Left Hip Internal Rotation  4+/5    Left Hip ABduction  4+/5    Left Hip ADduction  4/5    Right/Left Knee  Right;Left    Right Knee Flexion  5/5    Right Knee Extension  5/5    Left Knee Flexion  4/5    Left Knee Extension  4+/5      Flexibility   Soft Tissue Assessment /Muscle Length  --   tight left piriformis, hip lateral rotators     Palpation   Palpation comment  Tender to palpation left greater trochanteric region and left gluteus medius/minumus and piriformis      Special Tests   Other special tests  SLR (-), mild lateral hip soreness with FABER but otherwise FABER and FADIR, Scour (-)                Objective measurements completed on examination: See above findings.      [facility] Adult PT Treatment/Exercise - 10/27/18 0001      Exercises   Exercises  Knee/Hip      Knee/Hip Exercises: Stretches   Other Knee/Hip Stretches  HEP instruction and practice hip lateral rotator and piriformis stretches      Knee/Hip Exercises: Standing   Abduction Limitations  HEP practice hip abd SLR with bodyweight and progressing to  adding band      Knee/Hip Exercises: Supine   Other Supine Knee/Hip Exercises  HEP instruction and practice clamshells with red band, hip bridge  PT Education - 10/27/18 1554    Education Details  exam findings, potential symptom etiology, HEP, POC, tennis ball use for STM left lateral hip (issued tennis ball and red Theraband for HEP)    Person(s) Educated  Patient    Methods  Explanation;Demonstration;Verbal cues;Tactile cues;Handout    Comprehension  Verbalized understanding;Returned demonstration;Verbal cues required;Tactile cues required          PT Long Term Goals - 10/27/18 1606      PT LONG TERM GOAL #1   Title  Independent with HEP    Baseline  no HEP    Time  6    Period  Weeks    Status  New    Target Date  12/08/18      PT LONG TERM GOAL #2   Title  Improve FOTO to 39% or less impairment due to hip pain    Baseline  55% limited    Time  6    Period  Weeks    Status  New    Target Date  12/08/18      PT LONG TERM GOAL #3   Title  Increase left hip strength grossly 1/2 MMT grade to improve pelvic stability with gait for improved walking tolerance    Baseline  see flowsheet    Time  6    Period  Weeks    Status  New    Target Date  12/08/18      PT LONG TERM GOAL #4   Title  Tolerate household and community ambulation, standing periods at least 30 min for activities such as grocery shopping with hip pain 3/10 or less    Baseline  pain recently up to 10/10 but improving after injection    Time  6    Period  Weeks    Status  New    Target Date  12/08/18             Plan - 10/27/18 1556    Clinical Impression Statement  Pt. presents with left lateral hip pain with exam findings/clinical presentation consistent with trochanteric bursitis vs. abductor tendinopathy with contributing myofascial pain and associated hip muscle tightness/weakness. Pt. would benefit from PT to address current functional limitations. Given improvement after  injection (if improvement continues) plan will be to follow up for 2-3 visits to focus on HEP progression and manual therapy, possible dry needling with d/c vs. need for continued skilled PT pending further status.    Personal Factors and Comorbidities  Comorbidity 3+    Comorbidities  Diabetes, depression/anxiety, bipolar, CHF, history LBP    Examination-Activity Limitations  Stairs;Squat;Stand;Locomotion Level    Examination-Participation Restrictions  Art gallery manager;Shop    Stability/Clinical Decision Making  Stable/Uncomplicated    Clinical Decision Making  Low    Rehab Potential  Good    PT Frequency  --   1-2x/week   PT Duration  6 weeks    PT Treatment/Interventions  ADLs/Self Care Home Management;Cryotherapy;Ultrasound;Moist Heat;Iontophoresis 4mg /ml Dexamethasone;Electrical Stimulation;Therapeutic activities;Therapeutic exercise;Gait training;Neuromuscular re-education;Functional mobility training;Manual techniques;Dry needling;Taping;Patient/family education    PT Next Visit Plan  NUSTEP, Review HEP as needed, STM left glut med region and piriformis, foam roll if needed pending tolerance, possible trial dry needling, continue hip stretches    PT Home Exercise Plan  hip stretches for lateral hip, piriformis, hip abd SLR in standing, supine clamshell and bridge    Consulted and Agree with Plan of Care  Patient       Patient will benefit from  skilled therapeutic intervention in order to improve the following deficits and impairments:  Pain, Difficulty walking, Impaired flexibility, Decreased activity tolerance, Decreased strength, Increased muscle spasms  Visit Diagnosis: Pain in left hip  Muscle weakness (generalized)  Difficulty in walking, not elsewhere classified     Problem List Patient Active Problem List   Diagnosis Date Noted  . Elevated LFTs 10/22/2018  . Benign paroxysmal positional vertigo 10/19/2018  . Idiopathic hypotension 10/19/2018  . Hip pain  09/29/2018  . Hand pain, left 09/29/2018  . Transaminitis 09/29/2018  . Acute on chronic heart failure ([place]) 09/14/2018  . Hyperkalemia 08/11/2018  . Diabetes mellitus type 2 in nonobese (HCC)   . Bipolar disorder in full remission ([place])   . Malnutrition of moderate degree 07/23/2018  . Diabetes mellitus due to underlying condition, uncontrolled, with diabetic neuropathy, with long-term current use of insulin ([place]) 03/04/2018  . Healthcare maintenance 10/29/2017  . COPD with emphysema ([place]) 02/06/2017  . 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 Poor appetite 10/04/2015  . Personal history of colonic polyps 09/13/2015  . Tardive dyskinesia 06/19/2014  . Cataracts, bilateral 03/21/2014  . Chronic diastolic CHF (congestive heart failure) ([place]) 07/26/2013  . Primary hypertension 08/12/2012  . Other bipolar disorder ([place]) 08/23/2009  . Fibromyalgia 08/06/2009  . Gastroparesis 03/22/2009  . Peripheral autonomic neuropathy due to DM ([place]) 01/29/2007  . Tobacco use disorder 08/18/2006  . Hyperlipidemia due to type 2 diabetes mellitus ([place]) 08/13/2006  . Depression, major, recurrent ([place]) 08/13/2006  . GERD (gastroesophageal reflux disease) 08/13/2006    Beaulah Dinning, PT, DPT 10/27/18 4:12 PM  [facility] [address] [place], [place], [address] Phone: [phone]   Fax:  [phone]  Name: [name] MRN: [id number] Date of Birth: 09/15/1955

## 2018-11-02 ENCOUNTER — Other Ambulatory Visit: Payer: Self-pay | Admitting: Family Medicine

## 2018-11-02 DIAGNOSIS — E1165 Type 2 diabetes mellitus with hyperglycemia: Secondary | ICD-10-CM

## 2018-11-03 NOTE — Telephone Encounter (Signed)
Pt also needs refill on her insulin pens. Please advise

## 2018-11-04 ENCOUNTER — Other Ambulatory Visit: Payer: Self-pay | Admitting: Family Medicine

## 2018-11-04 MED ORDER — INSULIN DEGLUDEC 100 UNIT/ML ~~LOC~~ SOPN: 40.0000 [IU] | PEN_INJECTOR | Freq: Every day | SUBCUTANEOUS | 0 refills | Status: DC

## 2018-11-04 MED ORDER — INSULIN ASPART 100 UNIT/ML ~~LOC~~ SOLN: 4.0000 [IU] | Freq: Two times a day (BID) | SUBCUTANEOUS | 0 refills | Status: DC

## 2018-11-04 NOTE — Addendum Note (Signed)
Addended by: Dorna Bloom on: 11/04/2018 09:54 AM   Modules accepted: Orders

## 2018-11-05 ENCOUNTER — Ambulatory Visit: Payer: Medicare HMO | Admitting: Physical Therapy

## 2018-11-05 ENCOUNTER — Telehealth: Payer: Self-pay | Admitting: Physical Therapy

## 2018-11-05 NOTE — Telephone Encounter (Signed)
Called patient to check on status regarding no show for appointment this AM. She reports could not find number to call to cancel appointment/unable to attend this AM. Provided clinic number and confirmed appointment time for next visit.

## 2018-11-09 ENCOUNTER — Telehealth: Payer: Self-pay | Admitting: *Deleted

## 2018-11-09 ENCOUNTER — Other Ambulatory Visit: Payer: Self-pay

## 2018-11-09 ENCOUNTER — Ambulatory Visit: Payer: Medicare HMO | Admitting: Physical Therapy

## 2018-11-09 ENCOUNTER — Encounter: Payer: Self-pay | Admitting: Physical Therapy

## 2018-11-09 DIAGNOSIS — R262 Difficulty in walking, not elsewhere classified: Secondary | ICD-10-CM

## 2018-11-09 DIAGNOSIS — M6281 Muscle weakness (generalized): Secondary | ICD-10-CM

## 2018-11-09 DIAGNOSIS — M25552 Pain in left hip: Secondary | ICD-10-CM

## 2018-11-09 NOTE — Therapy (Addendum)
[place], [place], [address] Phone: [phone]   Fax:  [phone]  Physical Therapy Treatment/Discharge  Patient Details  Name: [name] MRN: [id number] Date of Birth: [birth year] Referring Provider (PT): Andrena Mews, MD   Encounter Date: 11/09/2018  PT End of Session - 11/09/18 1117    Visit Number  2    Number of Visits  12    Date for PT Re-Evaluation  12/08/18    Authorization Type  [employer]    PT Start Time  1117    PT Stop Time  1156    PT Time Calculation (min)  39 min    Activity Tolerance  Patient tolerated treatment well    Behavior During Therapy  [facility] for tasks assessed/performed       Past Medical History:  Diagnosis Date  . Allergy   . Anxiety   . Aortic insufficiency    a. mild-mod by echo 07/2018.  [name] Arthritis   . Bipolar 2 disorder ([place])   . Cataract   . Chest pain   . Chronic combined systolic and diastolic CHF (congestive heart failure) ([place])   . Depression   . DM (diabetes mellitus), type 2 ([place])   . GERD (gastroesophageal reflux disease)   . HTN (hypertension)   . Hypercholesteremia   . Marijuana abuse   . Moderate mitral regurgitation   . Moderate tricuspid regurgitation   . NICM (nonischemic cardiomyopathy) ([place])   . Renal insufficiency   . Trigger finger    r index inj 12/08, bilateral thumb inj 11/09, r thumb and dequervain's inj 01/2009    Past Surgical History:  Procedure Laterality Date  . ABDOMINAL HYSTERECTOMY  1986   fibroids  . CARDIAC CATHETERIZATION  05/06/2004   normal LV fxn EF>60%  . CYSTECTOMY     back of head  . LEFT HEART CATH AND CORONARY ANGIOGRAPHY N/A 07/26/2018   Procedure: LEFT HEART CATH AND CORONARY ANGIOGRAPHY;  Surgeon: Sherren Mocha, MD;  Location: [place] [facility];  Service: Cardiovascular;  Laterality: N/A;  . LEFT HEART CATHETERIZATION WITH CORONARY ANGIOGRAM N/A 05/26/2011   Procedure: LEFT HEART  CATHETERIZATION WITH CORONARY ANGIOGRAM;  Surgeon: Larey Dresser, MD;  Location: [facility] CATH LAB;  Service: Cardiovascular;  Laterality: N/A;  radial  . perirpheral iridectomy  02/12/2005  . TEE WITHOUT CARDIOVERSION N/A 08/05/2018   Procedure: TRANSESOPHAGEAL ECHOCARDIOGRAM (TEE);  Surgeon: Josue Hector, MD;  Location: [facility] ENDOSCOPY;  Service: Cardiovascular;  Laterality: N/A;    There were no vitals filed for this visit.  Subjective Assessment - 11/09/18 1117    Subjective  Just a little sore today. Saturday I had an episode where I couldn't walk- it was like a hard cramp that improved as I moved.     Patient Stated Goals  Improve hip pain    Currently in Pain?  Yes    Pain Score  1     Pain Location  Hip    Pain Orientation  Left    Pain Descriptors / Indicators  Sore    Aggravating Factors   laying on Lt side    Pain Relieving Factors  moving around                       [employer] Adult PT Treatment/Exercise - 11/09/18 0001      Knee/Hip Exercises: Stretches   Passive Hamstring Stretch Limitations  seated EOB with towel    Hip  Flexor Stretch Limitations  modified thomas    Piriformis Stretch Limitations  figure 4 pull across    Gastroc Stretch  Both;30 seconds    Gastroc Stretch Limitations  slant board    Other Knee/Hip Stretches  butterfly      Knee/Hip Exercises: Aerobic   Nustep  5 min L5 LE only      Knee/Hip Exercises: Standing   Other Standing Knee Exercises  glut sets      Knee/Hip Exercises: Seated   Long Arc Quad  Both;2 sets;10 reps    Long Arc Quad Limitations  pillow bw knees      Knee/Hip Exercises: Supine   Bridges with [employer]  15 reps;Strengthening   cues for pelvic tilt     Manual Therapy   Manual Therapy  Soft tissue mobilization    Soft tissue mobilization  roller Lt hip                  PT Long Term Goals - 10/27/18 1606      PT LONG TERM GOAL #1   Title  Independent with HEP    Baseline  no HEP    Time  6     Period  Weeks    Status  New    Target Date  12/08/18      PT LONG TERM GOAL #2   Title  Improve FOTO to 39% or less impairment due to hip pain    Baseline  55% limited    Time  6    Period  Weeks    Status  New    Target Date  12/08/18      PT LONG TERM GOAL #3   Title  Increase left hip strength grossly 1/2 MMT grade to improve pelvic stability with gait for improved walking tolerance    Baseline  see flowsheet    Time  6    Period  Weeks    Status  New    Target Date  12/08/18      PT LONG TERM GOAL #4   Title  Tolerate household and community ambulation, standing periods at least 30 min for activities such as grocery shopping with hip pain 3/10 or less    Baseline  pain recently up to 10/10 but improving after injection    Time  6    Period  Weeks    Status  New    Target Date  12/08/18            Plan - 11/09/18 1306    Clinical Impression Statement  Decreased pain with rolling to musculature- notable trigger point in piriformis. Significant weakness and pt had difficulty completing 5 min on nustep. We discussed the importance of improving strength and flexibility.     PT Treatment/Interventions  ADLs/Self Care Home Management;Cryotherapy;Ultrasound;Moist Heat;Iontophoresis 70m/ml Dexamethasone;Electrical Stimulation;Therapeutic activities;Therapeutic exercise;Gait training;Neuromuscular re-education;Functional mobility training;Manual techniques;Dry needling;Taping;Patient/family education    PT Next Visit Plan  NUSTEP, STM PRN, gross LE strength/endurance    PT Home Exercise Plan  hip stretches for lateral hip, piriformis, hip abd SLR in standing, supine clamshell and bridge; standing glut sets    Consulted and Agree with Plan of Care  Patient       Patient will benefit from skilled therapeutic intervention in order to improve the following deficits and impairments:  Pain, Difficulty walking, Impaired flexibility, Decreased activity tolerance, Decreased strength,  Increased muscle spasms  Visit Diagnosis: Pain in left hip  Muscle weakness (generalized)  Difficulty in walking, not elsewhere classified     Problem List Patient Active Problem List   Diagnosis Date Noted  . Elevated LFTs 10/22/2018  . Benign paroxysmal positional vertigo 10/19/2018  . Idiopathic hypotension 10/19/2018  . Hip pain 09/29/2018  . Hand pain, left 09/29/2018  . Transaminitis 09/29/2018  . Acute on chronic heart failure ([place]) 09/14/2018  . Hyperkalemia 08/11/2018  . Diabetes mellitus type 2 in nonobese (HCC)   . Bipolar disorder in full remission ([place])   . Malnutrition of moderate degree 07/23/2018  . Diabetes mellitus due to underlying condition, uncontrolled, with diabetic neuropathy, with long-term current use of insulin ([place]) 03/04/2018  . Healthcare maintenance 10/29/2017  . COPD with emphysema ([place]) 02/06/2017  . 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 Poor appetite 10/04/2015  . Personal history of colonic polyps 09/13/2015  . Tardive dyskinesia 06/19/2014  . Cataracts, bilateral 03/21/2014  . Chronic diastolic CHF (congestive heart failure) ([place]) 07/26/2013  . Primary hypertension 08/12/2012  . Other bipolar disorder ([place]) 08/23/2009  . Fibromyalgia 08/06/2009  . Gastroparesis 03/22/2009  . Peripheral autonomic neuropathy due to DM ([place]) 01/29/2007  . Tobacco use disorder 08/18/2006  . Hyperlipidemia due to type 2 diabetes mellitus ([place]) 08/13/2006  . Depression, major, recurrent ([place]) 08/13/2006  . GERD (gastroesophageal reflux disease) 08/13/2006    Jb Dulworth C. Hazen Brumett PT, DPT 11/09/18 1:08 PM   [facility] [address] [place], [place], [address] Phone: [phone]   Fax:  [phone]  Name: CAMILLIA MARCY MRN: [id number] Date of Birth: 1955/12/03 PHYSICAL THERAPY DISCHARGE SUMMARY  Visits from Start of Care: 2  Current functional level related to goals / functional  outcomes: See above   Remaining deficits: See above   Education / Equipment: Anatomy of condition, POC, HEP, exercise form/rationale  Plan: Patient agrees to discharge.  Patient goals were not met. Patient is being discharged due to not returning since the last visit.  ?????     Chalmers Iddings C. Sarvesh Meddaugh PT, DPT 02/16/19 12:31 PM

## 2018-11-09 NOTE — Telephone Encounter (Signed)

## 2018-11-11 ENCOUNTER — Other Ambulatory Visit: Payer: Self-pay

## 2018-11-11 ENCOUNTER — Telehealth (INDEPENDENT_AMBULATORY_CARE_PROVIDER_SITE_OTHER): Payer: Medicare HMO | Admitting: Cardiology

## 2018-11-11 ENCOUNTER — Encounter: Payer: Self-pay | Admitting: Cardiology

## 2018-11-11 VITALS — BP 151/76 | HR 93 | Ht 62.0 in | Wt 144.0 lb

## 2018-11-11 DIAGNOSIS — I420 Dilated cardiomyopathy: Secondary | ICD-10-CM

## 2018-11-11 DIAGNOSIS — E0865 Diabetes mellitus due to underlying condition with hyperglycemia: Secondary | ICD-10-CM

## 2018-11-11 DIAGNOSIS — Z794 Long term (current) use of insulin: Secondary | ICD-10-CM

## 2018-11-11 DIAGNOSIS — E0840 Diabetes mellitus due to underlying condition with diabetic neuropathy, unspecified: Secondary | ICD-10-CM

## 2018-11-11 DIAGNOSIS — Z7189 Other specified counseling: Secondary | ICD-10-CM

## 2018-11-11 DIAGNOSIS — I471 Supraventricular tachycardia: Secondary | ICD-10-CM | POA: Diagnosis not present

## 2018-11-11 DIAGNOSIS — IMO0002 Reserved for concepts with insufficient information to code with codable children: Secondary | ICD-10-CM

## 2018-11-11 DIAGNOSIS — I10 Essential (primary) hypertension: Secondary | ICD-10-CM | POA: Diagnosis not present

## 2018-11-11 DIAGNOSIS — I5042 Chronic combined systolic (congestive) and diastolic (congestive) heart failure: Secondary | ICD-10-CM | POA: Diagnosis not present

## 2018-11-11 DIAGNOSIS — I5032 Chronic diastolic (congestive) heart failure: Secondary | ICD-10-CM

## 2018-11-11 MED ORDER — FUROSEMIDE 80 MG PO TABS: ORAL_TABLET | ORAL | 3 refills | Status: DC

## 2018-11-11 NOTE — Progress Notes (Signed)
Virtual Visit via telephone Note   This visit type was conducted due to national recommendations for restrictions regarding the COVID-19 Pandemic (e.g. social distancing) in an effort to limit this patient's exposure and mitigate transmission in our community.  Due to her co-morbid illnesses, this patient is at least at moderate risk for complications without adequate follow up.  This format is felt to be most appropriate for this patient at this time.  All issues noted in this document were discussed and addressed.  A limited physical exam was performed with this format.  Please refer to the patient's chart for her consent to telehealth for [facility].  Evaluation Performed:  Follow-up visit  This visit type was conducted due to national recommendations for restrictions regarding the COVID-19 Pandemic (e.g. social distancing).  This format is felt to be most appropriate for this patient at this time.  All issues noted in this document were discussed and addressed.  No physical exam was performed (except for noted visual exam findings with Video Visits).  Please refer to the patient's chart (MyChart message for video visits and phone note for telephone visits) for the patient's consent to telehealth for [facility].  Date:  11/11/2018   ID:  [name], DOB Nov 17, 1955, MRN [id number]  Patient Location:  Home  Provider location:   [place]  PCP:  Mercedes [place] Bing, DO  Cardiologist:  Mercedes Him, MD  Electrophysiologist:  None   Chief Complaint:  CHF/NIDCM  History of Present Illness:    [name] is a 63 y.o. female who presents via audio/video conferencing for a telehealth visit today.    [name] is a 63 y.o. female femalewith history ofDM, dyslipidemia (followed by PCP), HTN, GERD, tobacco abuse, bipolar disorder, THC use,  combined CHF/NICM, mitral regurgitation, tricuspid regurgitation and aortic regurgitation presents for follow up.   She was  seen by me for atypical CP and diastolic dysfunction with normal EF in 2015. She was admitted 07/22/18 with progressive worsening dyspnea, orthopnea and lower extremity edema and was found to have new onset CHF with echo showing EF 45-50%, moderate DD, moderate MR, moderate TR, mild-mod AR. Cardiac cath 07/26/18 showed no significant CAD - mild luminal irregularities of the RCa/LAD, low intracardiac filling pressures and preserved cardiac output. Cath hemodynamics were not suggestive of severe MR. Telemetry did show SVT felt to possibly represent nonsustained atrial tachycardia and outpatient event monitor was recommended.   Seen in clinic 08/03/18. Increase metoprolol for palpitations. Stopped Candesartan.  TEE 2/20 showed LV function of 40 to 45%, normal right ventricular function, rheumatic mitral valve with mild regurgitation.  No mitral stenosis.  Moderate TR.  Mild calcification of aortic valve.  She was seen back to 25 2020 by my extender and appears euvolemic.  She is on diuretics Monday Wednesday and Friday.  Her candesartan was restarted at that office.  She is here today for followup and is doing well.  She denies any chest pain or pressure, SOB, DOE, PND, orthopnea, LE edema, dizziness, palpitations or syncope. She is compliant with her meds and is tolerating meds with no SE.    The patient does not have symptoms concerning for COVID-19 infection (fever, chills, cough, or new shortness of breath).    Prior CV studies:   The following studies were reviewed today:  2D echo, cardiac cath  Past Medical History:  Diagnosis Date  . Allergy   . Anxiety   . Aortic insufficiency    a.  mild-mod by echo 07/2018 and trivial by subsequent TEE  . Arthritis   . Bipolar 2 disorder ([place])   . Cataract   . Chronic combined systolic and diastolic CHF (congestive heart failure) ([place])   . Depression   . DM (diabetes mellitus), type 2 ([place])   . GERD (gastroesophageal reflux disease)   . HTN  (hypertension)   . Hypercholesteremia   . Marijuana abuse   . 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 Moderate tricuspid regurgitation   . NICM (nonischemic cardiomyopathy) ([place])   . Renal insufficiency   . SVT (supraventricular tachycardia) (HCC)    nonsustained atrial tachycardia  . Trigger finger    r index inj 12/08, bilateral thumb inj 11/09, r thumb and dequervain's inj 01/2009   Past Surgical History:  Procedure Laterality Date  . ABDOMINAL HYSTERECTOMY  1986   fibroids  . CARDIAC CATHETERIZATION  05/06/2004   normal LV fxn EF>60%  . CYSTECTOMY     back of head  . LEFT HEART CATH AND CORONARY ANGIOGRAPHY N/A 07/26/2018   Procedure: LEFT HEART CATH AND CORONARY ANGIOGRAPHY;  Surgeon: Sherren Mocha, MD;  Location: [place] CV LAB;  Service: Cardiovascular;  Laterality: N/A;  . LEFT HEART CATHETERIZATION WITH CORONARY ANGIOGRAM N/A 05/26/2011   Procedure: LEFT HEART CATHETERIZATION WITH CORONARY ANGIOGRAM;  Surgeon: Larey Dresser, MD;  Location: [facility] CATH LAB;  Service: Cardiovascular;  Laterality: N/A;  radial  . perirpheral iridectomy  02/12/2005  . TEE WITHOUT CARDIOVERSION N/A 08/05/2018   Procedure: TRANSESOPHAGEAL ECHOCARDIOGRAM (TEE);  Surgeon: Josue Hector, MD;  Location: [facility] ENDOSCOPY;  Service: Cardiovascular;  Laterality: N/A;     Current Meds  Medication Sig  . acetaminophen (TYLENOL) 500 MG tablet Take 1,000 mg by mouth every 6 (six) hours as needed for mild pain.  [name] atorvastatin (LIPITOR) 80 MG tablet Take 1 tablet (80 mg total) by mouth every morning.  . Blood Glucose Monitoring Suppl (ONE TOUCH ULTRA 2) W/DEVICE KIT 1 kit by Does not apply route once.  . candesartan (ATACAND) 4 MG tablet Take 1 tablet (4 mg total) by mouth at bedtime.  . famotidine (PEPCID) 40 MG tablet Take 1 tablet by mouth once daily  . feeding supplement, ENSURE ENLIVE, (ENSURE ENLIVE) LIQD Take 237 mLs by  mouth 3 (three) times daily between meals.  . furosemide (LASIX) 80 MG tablet Take 1 tablet (80 mg total) by mouth daily.  [name] gabapentin (NEURONTIN) 100 MG capsule Take 1 capsule (100 mg total) by mouth 3 (three) times daily.  [name] glucose blood (ONE TOUCH ULTRA TEST) test strip USE 1 STRIP TO CHECK GLUCOSE THREE TIMES DAILY  . insulin aspart (NOVOLOG) 100 UNIT/ML injection Inject 4-8 Units into the skin 2 (two) times daily with a meal. (Patient taking differently: Inject 4-8 Units into the skin 2 (two) times daily with a meal. 3 times a day with meals)  . insulin degludec (TRESIBA FLEXTOUCH) 100 UNIT/ML SOPN FlexTouch Pen Inject 0.4 mLs (40 Units total) into the skin daily.  . Insulin Pen Needle 33G X 4 MM MISC 1 Units by Does not apply route 5 (five) times daily.  . Lancets (ONETOUCH ULTRASOFT) lancets Use as instructed  . metoprolol succinate (TOPROL-XL) 50 MG 24 hr tablet Take 2 tablet by mouth in the a.m, take 1 tablet by mouth in the p.m Take with or immediately following a meal. (Patient taking differently: Take 50 mg  by mouth daily. )  . mirtazapine (REMERON) 15 MG tablet Take 1 tablet (15 mg total) by mouth at bedtime.  . ONE TOUCH LANCETS MISC 1 Device by Does not apply route 3 (three) times daily.     Allergies:   Ibuprofen and Lisinopril   Social History   Tobacco Use  . Smoking status: Former Smoker    Packs/day: 0.50    Years: 43.00    Pack years: 21.50    Types: Cigarettes  . Smokeless tobacco: Never Used  Substance Use Topics  . Alcohol use: Yes    Alcohol/week: 0.0 standard drinks    Comment: drinks socially occaisionally  . Drug use: Not Currently     Family Hx: The patient's family history includes Asthma in her son and son; Bipolar disorder in her brother, mother, and sister; Diabetes in her brother, brother, brother, brother, father, mother, sister, sister, sister, sister, sister, and sister; Heart attack in her mother; Heart disease in her mother; Hypertension in her  sister. There is no history of Colon cancer or Esophageal cancer.  ROS:   Please see the history of present illness.     All other systems reviewed and are negative.   Labs/Other Tests and Data Reviewed:    Recent Labs: 07/23/2018: Magnesium 1.9; TSH 2.620 09/11/2018: B Natriuretic Peptide 773.0 10/12/2018: ALT 44 10/13/2018: BUN 28; Creatinine, Ser 0.98; Hemoglobin 11.6; Platelets 268; Potassium 3.6; Sodium 139   Recent Lipid Panel Lab Results  Component Value Date/Time   CHOL 188 09/17/2018 10:16 AM   TRIG 148 09/17/2018 10:16 AM   HDL 65 09/17/2018 10:16 AM   CHOLHDL 2.9 09/17/2018 10:16 AM   CHOLHDL 3.5 07/23/2018 11:22 AM   LDLCALC 93 09/17/2018 10:16 AM   LDLDIRECT 237 (H) 06/25/2017 01:37 PM   LDLDIRECT 101 (H) 05/31/2013 02:21 PM    Wt Readings from Last 3 Encounters:  11/11/18 144 lb (65.3 kg)  10/21/18 141 lb 9.6 oz (64.2 kg)  10/13/18 143 lb (64.9 kg)     Objective:    Vital Signs:  BP (!) 151/76   Pulse 93   Ht 5' 2"  (1.575 m)   Wt 144 lb (65.3 kg)   BMI 26.34 kg/m   How high [place] this is Dr. Radford Pax how you do when  ASSESSMENT & PLAN:    1.  Chronic combined systolic/diastolic CHF -this is secondary to nonischemic dilated cardiomyopathy.  Cardiac cath earlier this year showed patent coronary arteries with luminal irregularities in the RCA and LAD.  She recently gained weight up to 160lbs and was SOB and had LE edema but this has improved and weight is now down to 144lbs.   She has not had any shortness of breath or lower extremity edema a although her weight has increased 9 lbs since February.  She admits to gettting a lot of takeout food.   Her SOB and LE edema have resolved. I have encouraged her to follow a 2gm sodium diet.  She will continue on Lasix 92m daily.  She uses extra Lasix PRN when she develops LE edema and SOB. Her creatinine was normal at 0.98.  She will weigh daily and take an extra Lasix if she gains more than 3 lbs in a day or 5lbs in a  week and if weight does not go down she will call.    2.  Nonischemic dilated cardiomyopathy -2D echo showed mildly reduced LV function with EF 40 to 45%.  She will continue on beta-blocker and  ARB.  3.  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  4.  Hypertension -her blood pressure is elevated on exam today .  She has not taken her Toprol yet.    I have asked her to check her blood pressure daily for a week and call me with the results.  5.  Diabetes mellitus type 2 -this is followed by her PCP and is poorly controlled.  Her last hemoglobin A1c was 13.7.  I have encouraged her to follow-up with her primary doctor. She will continue on insulin  6.  SVT - This was felt to be nonsustained atrial tachycardia.  She has not had any atrial fibrillation documented on event monitor.  She will continue on beta-blocker therapy.  6.  COVID-19 Education:The signs and symptoms of COVID-19 were discussed with the patient and how to seek care for testing (follow up with PCP or arrange E-visit).  The importance of social distancing was discussed today.  Patient Risk:   After full review of this patient's clinical status, I feel that they are at least moderate risk at this time.  Time:   Today, I have spent 12 minutes directly with the patient on telephone discussing medical problems including CHF, DCM, HTN.  We also reviewed the symptoms of COVID 19 and the ways to protect against contracting the virus with telehealth technology.  I spent an additional 5 minutes reviewing patient's chart including 2D echo and cardiac cath.  Medication Adjustments/Labs and Tests Ordered: Current medicines are reviewed at length with the patient today.  Concerns regarding medicines are outlined above.  Tests Ordered: No orders of the defined types were placed in this encounter.  Medication Changes: No orders of the defined types were placed in this encounter.    Disposition:  Follow up 3 months  Signed, Mercedes Him, MD  11/11/2018 9:47 AM    [place]

## 2018-11-11 NOTE — Patient Instructions (Signed)
Medication Instructions:  Dose Change: Take 80 mg, 1 tablet, by mouth, daily, take an extra 80 mg tablet, as needed daily, if you have SOB,  Edema or if you gain 3 lbs in a day or 5 lbs in 1 week.  If you need a refill on your cardiac medications before your next appointment, please call your pharmacy.   Lab work: None If you have labs (blood work) drawn today and your tests are completely normal, you will receive your results only by: [name] MyChart Message (if you have MyChart) OR . A paper copy in the mail If you have any lab test that is abnormal or we need to change your treatment, we will call you to review the results.  Testing/Procedures: None  Follow-Up: 02/09/19 at 9:20 with Dr. Radford Pax   Any Other Special Instructions Will Be Listed Below (If Applicable). Check your blood pressure for 1 week and call the office with the results.   Weigh yourself everyday around the same time, if you gain 3 lbs of weight in a day or 5 lbs in a week, take an extra 80 mg, lasix.

## 2018-11-12 ENCOUNTER — Ambulatory Visit: Payer: Medicare HMO | Admitting: Physical Therapy

## 2018-11-19 ENCOUNTER — Other Ambulatory Visit: Payer: Self-pay

## 2018-11-19 ENCOUNTER — Ambulatory Visit (AMBULATORY_SURGERY_CENTER): Payer: Self-pay | Admitting: *Deleted

## 2018-11-19 VITALS — Ht 62.0 in | Wt 142.0 lb

## 2018-11-19 DIAGNOSIS — Z86010 Personal history of colonic polyps: Secondary | ICD-10-CM

## 2018-11-19 NOTE — Progress Notes (Signed)
No egg or soy allergy known to patient  No issues with past sedation with any surgeries  or procedures, no intubation problems  No diet pills per patient No home 02 use per patient  No blood thinners per patient  Pt states  issues with constipation - she states uses enemas at least 2 x a week to be able to have a /bm - will do a 2 day prep due to this  No A fib or A flutter  EMMI video sent to pt's e mail   Pt verified name, DOB, address and insurance during PV today. Pt mailed instruction packet to included paper to complete and mail back to [facility] with addressed and stamped envelope, Emmi video, copy of consent form to read and not return, and instructions. PV completed over the phone. Pt encouraged to call with questions or issues

## 2018-11-29 ENCOUNTER — Telehealth: Payer: Self-pay | Admitting: *Deleted

## 2018-11-29 NOTE — Telephone Encounter (Signed)
-----   Message from Steve Rattler, DO sent at 11/28/2018  7:02 PM EDT ----- Regarding: needs appt  Pt needs to be seen for DM, labs if we could make her an appt

## 2018-11-29 NOTE — Telephone Encounter (Signed)
LM for patient to call back and schedule an appointment to follow up on her diabetes and get some repeat labs.  Ok per [employer] in chart.  Please assist patient in scheduling when she calls back. Armella Stogner,CMA

## 2018-11-30 ENCOUNTER — Telehealth: Payer: Self-pay | Admitting: Internal Medicine

## 2018-11-30 NOTE — Telephone Encounter (Signed)

## 2018-12-01 ENCOUNTER — Encounter: Payer: Self-pay | Admitting: Internal Medicine

## 2018-12-01 ENCOUNTER — Other Ambulatory Visit: Payer: Self-pay

## 2018-12-01 ENCOUNTER — Ambulatory Visit (AMBULATORY_SURGERY_CENTER): Payer: Medicare HMO | Admitting: Internal Medicine

## 2018-12-01 VITALS — BP 151/76 | HR 99 | Temp 98.6°F | Resp 18 | Ht 62.0 in | Wt 142.0 lb

## 2018-12-01 DIAGNOSIS — D125 Benign neoplasm of sigmoid colon: Secondary | ICD-10-CM | POA: Diagnosis not present

## 2018-12-01 DIAGNOSIS — D123 Benign neoplasm of transverse colon: Secondary | ICD-10-CM

## 2018-12-01 DIAGNOSIS — Z86010 Personal history of colonic polyps: Secondary | ICD-10-CM | POA: Diagnosis not present

## 2018-12-01 DIAGNOSIS — J449 Chronic obstructive pulmonary disease, unspecified: Secondary | ICD-10-CM | POA: Diagnosis not present

## 2018-12-01 DIAGNOSIS — I509 Heart failure, unspecified: Secondary | ICD-10-CM | POA: Diagnosis not present

## 2018-12-01 MED ORDER — SODIUM CHLORIDE 0.9 % IV SOLN: 500.0000 mL | Freq: Once | INTRAVENOUS | Status: DC

## 2018-12-01 NOTE — Op Note (Signed)
[facility] Patient Name: [name] Procedure Date: 12/01/2018 8:08 AM MRN: [id number] Endoscopist: Gatha Mayer , MD Age: 63 Referring MD:  Date of Birth: 1956/03/04 Gender: Female Account #: [id number] Procedure:                Colonoscopy Indications:              Surveillance: Personal history of adenomatous                            polyps on last colonoscopy 3 years ago Medicines:                Propofol per Anesthesia, Monitored Anesthesia Care Procedure:                Pre-Anesthesia Assessment:                           - Prior to the procedure, a History and Physical                            was performed, and patient medications and                            allergies were reviewed. The patient's tolerance of                            previous anesthesia was also reviewed. The risks                            and benefits of the procedure and the sedation                            options and risks were discussed with the patient.                            All questions were answered, and informed consent                            was obtained. Prior Anticoagulants: The patient has                            taken no previous anticoagulant or antiplatelet                            agents. ASA Grade Assessment: III - A patient with                            severe systemic disease. After reviewing the risks                            and benefits, the patient was deemed in                            satisfactory condition to undergo the procedure.  After obtaining informed consent, the colonoscope                            was passed under direct vision. Throughout the                            procedure, the patient's blood pressure, pulse, and                            oxygen saturations were monitored continuously. The                            Colonoscope was introduced through the anus and    advanced to the the cecum, identified by                            appendiceal orifice and ileocecal valve. The                            colonoscopy was somewhat difficult due to                            significant looping. Successful completion of the                            procedure was aided by using manual pressure. The                            patient tolerated the procedure well. The quality                            of the bowel preparation was adequate. The                            ileocecal valve, appendiceal orifice, and rectum                            were photographed. The bowel preparation used was                            Miralax via split dose instruction. Scope In: 8:17:23 AM Scope Out: 8:39:35 AM Scope Withdrawal Time: 0 hours 15 minutes 8 seconds  Total Procedure Duration: 0 hours 22 minutes 12 seconds  Findings:                 The perianal and digital rectal examinations were                            normal.                           Five sessile polyps were found in the sigmoid colon                            and transverse colon. The polyps were  diminutive in                            size. These polyps were removed with a cold snare.                            Resection and retrieval were complete. Verification                            of patient identification for the specimen was                            done. Estimated blood loss was minimal.                           Multiple diverticula were found in the sigmoid                            colon.                           The exam was otherwise without abnormality on                            direct and retroflexion views. Complications:            No immediate complications. Estimated Blood Loss:     Estimated blood loss was minimal. Estimated blood                            loss was minimal. Impression:               - Five diminutive polyps in the sigmoid colon and                             in the transverse colon, removed with a cold snare.                            Resected and retrieved.                           - Diverticulosis in the sigmoid colon.                           - The examination was otherwise normal on direct                            and retroflexion views.                           - Personal history of colonic polyps. At least 9                            adenomas lifetime Recommendation:           - Patient has a contact number available for  emergencies. The signs and symptoms of potential                            delayed complications were discussed with the                            patient. Return to normal activities tomorrow.                            Written discharge instructions were provided to the                            patient.                           - Resume previous diet.                           - Continue present medications.                           - Repeat colonoscopy is recommended for                            surveillance. The colonoscopy date will be                            determined after pathology results from today's                            exam become available for review. Gatha Mayer, MD 12/01/2018 8:49:54 AM This report has been signed electronically.

## 2018-12-01 NOTE — Patient Instructions (Addendum)
I found and removed 5 tiny polyps today. All look benign.  I will let you know pathology results and when to have another routine colonoscopy by mail and/or My Chart.  I appreciate the opportunity to care for you. Gatha Mayer, MD, [facility]  There was also some diverticulosis.  YOU HAD AN ENDOSCOPIC PROCEDURE TODAY AT [facility]:   Refer to the procedure report that was given to you for any specific questions about what was found during the examination.  If the procedure report does not answer your questions, please call your gastroenterologist to clarify.  If you requested that your care partner not be given the details of your procedure findings, then the procedure report has been included in a sealed envelope for you to review at your convenience later.  YOU SHOULD EXPECT: Some feelings of bloating in the abdomen. Passage of more gas than usual.  Walking can help get rid of the air that was put into your GI tract during the procedure and reduce the bloating. If you had a lower endoscopy (such as a colonoscopy or flexible sigmoidoscopy) you may notice spotting of blood in your stool or on the toilet paper. If you underwent a bowel prep for your procedure, you may not have a normal bowel movement for a few days.  Please Note:  You might notice some irritation and congestion in your nose or some drainage.  This is from the oxygen used during your procedure.  There is no need for concern and it should clear up in a day or so.  SYMPTOMS TO REPORT IMMEDIATELY:   Following lower endoscopy (colonoscopy or flexible sigmoidoscopy):  Excessive amounts of blood in the stool  Significant tenderness or worsening of abdominal pains  Swelling of the abdomen that is new, acute  Fever of 100F or higher   For urgent or emergent issues, a gastroenterologist can be reached at any hour by calling [phone].   DIET:  We do recommend a small meal at first, but then you may  proceed to your regular diet.  Drink plenty of fluids but you should avoid alcoholic beverages for 24 hours.  ACTIVITY:  You should plan to take it easy for the rest of today and you should NOT DRIVE or use heavy machinery until tomorrow (because of the sedation medicines used during the test).    FOLLOW UP: Our staff will call the number listed on your records 48-72 hours following your procedure to check on you and address any questions or concerns that you may have regarding the information given to you following your procedure. If we do not reach you, we will leave a message.  We will attempt to reach you two times.  During this call, we will ask if you have developed any symptoms of COVID 19. If you develop any symptoms (ie: fever, flu-like symptoms, shortness of breath, cough etc.) before then, please call [phone].  If you test positive for Covid 19 in the 2 weeks post procedure, please call and report this information to [place].    If any biopsies were taken you will be contacted by phone or by letter within the next 1-3 weeks.  Please call us at [phone] if you have not heard about the biopsies in 3 weeks.    SIGNATURES/CONFIDENTIALITY: You and/or your care partner have signed paperwork which will be entered into your electronic medical record.  These signatures attest to the fact that that the information above on  your After Visit Summary has been reviewed and is understood.  Full responsibility of the confidentiality of this discharge information lies with you and/or your care-partner. 

## 2018-12-01 NOTE — Progress Notes (Signed)
Report given to PACU, vss 

## 2018-12-01 NOTE — Progress Notes (Signed)
Called to room to assist during endoscopic procedure.  Patient ID and intended procedure confirmed with present staff. Received instructions for my participation in the procedure from the performing physician.  

## 2018-12-01 NOTE — Telephone Encounter (Signed)
Patient has appt on 12-09-2018.  Jazmin Hartsell,CMA

## 2018-12-01 NOTE — Progress Notes (Signed)
Mercedes Washington-Temps.,[name]

## 2018-12-01 NOTE — Progress Notes (Signed)
Pt's states no medical or surgical changes since previsit or office visit. 

## 2018-12-03 ENCOUNTER — Telehealth: Payer: Self-pay | Admitting: *Deleted

## 2018-12-03 NOTE — Telephone Encounter (Signed)
  Follow up Call-  Call back number 12/01/2018  Post procedure Call Back phone  # [phone]  Permission to leave phone message Yes  Some recent data might be hidden     Patient questions:  Do you have a fever, pain , or abdominal swelling? No. Pain Score  0 *  Have you tolerated food without any problems? Yes.    Have you been able to return to your normal activities? Yes.    Do you have any questions about your discharge instructions: Diet   No. Medications  No. Follow up visit  No.  Do you have questions or concerns about your Care? Yes.    Actions: * If pain score is 4 or above: No action needed, pain <4.  1. Have you developed a fever since your procedure? no  2.   Have you had an respiratory symptoms (SOB or cough) since your procedure? no  3.   Have you tested positive for COVID 19 since your procedure no  4.   Have you had any family members/close contacts diagnosed with the COVID 19 since your procedure?  no   If yes to any of these questions please route to Joylene John, RN and Alphonsa Gin, Therapist, sports.

## 2018-12-03 NOTE — Telephone Encounter (Signed)
Left message on f/u call 

## 2018-12-08 ENCOUNTER — Encounter: Payer: Self-pay | Admitting: Internal Medicine

## 2018-12-08 NOTE — Progress Notes (Signed)
3 adenomas 2 hyperplastic Recall 2023

## 2018-12-09 ENCOUNTER — Other Ambulatory Visit: Payer: Self-pay

## 2018-12-09 ENCOUNTER — Ambulatory Visit (INDEPENDENT_AMBULATORY_CARE_PROVIDER_SITE_OTHER): Payer: Medicare HMO | Admitting: Family Medicine

## 2018-12-09 VITALS — BP 125/70 | HR 75

## 2018-12-09 DIAGNOSIS — E1140 Type 2 diabetes mellitus with diabetic neuropathy, unspecified: Secondary | ICD-10-CM

## 2018-12-09 DIAGNOSIS — Z794 Long term (current) use of insulin: Secondary | ICD-10-CM

## 2018-12-09 DIAGNOSIS — IMO0002 Reserved for concepts with insufficient information to code with codable children: Secondary | ICD-10-CM

## 2018-12-09 DIAGNOSIS — E1165 Type 2 diabetes mellitus with hyperglycemia: Secondary | ICD-10-CM | POA: Diagnosis not present

## 2018-12-09 NOTE — Patient Instructions (Signed)
Thank you for coming in to see [place] today. Please see below to review our plan for today's visit.  I am concerned about your low glucose levels.  Decrease your Tyler Aas to 35 units daily.  If you know you are not can I eat a meal, do not take your NovoLog.  Call us when you have glucose levels below 70 and be sure to eat something to prevent hypoglycemia.  It is very important you bring a glucometer at your next appointment in 1 week.  Please call the clinic at [phone] if your symptoms worsen or you have any concerns. It was our pleasure to serve you.  Harriet Butte, [place], PGY-3

## 2018-12-09 NOTE — Assessment & Plan Note (Signed)
History of poor control with occasional episodes of hypoglycemia.  Concern today due to patient's history of hypoglycemia, irregular eating patterns, and failure to follow-up with glucometer.  She does have clear understanding of how to treat hypoglycemia and can tell when she has episodes due to feeling fatigued and tremulous. - Decreasing Tresiba to 35 units in the morning and continue NovoLog 4-8 units with meals and instructed not to take NovoLog if she knows she will skip a meal - Discussed importance of eating when glucose levels drop below 70 and advised to contact clinic - RTC 1 week and will follow up with glucometer

## 2018-12-09 NOTE — Progress Notes (Signed)
   Subjective   Patient ID: [name]    DOB: May 13, 1956, 63 y.o. female   MRN: [id number]  CC: "Diabetes follow-up"  HPI: [name] is a 63 y.o. female who presents to clinic today for the following:  Diabetes: [name] is a longstanding brittle diabetic requiring insulin with occasional episodes of hypoglycemia with her last being 47 as of this morning.  She usually takes her [name] in the morning which was increased from 30 to 40 units during her last visit 2 months ago after she was experiencing persistent hyperglycemia and had an episode of HHS.  She also takes NovoLog 48 units with meals.  This is been an ongoing issue over the last few years primarily due to her irregular eating patterns.  She denies any polyuria, polydipsia, polyphagia, lower extremity swelling, change in vision, shortness of breath or chest pain or nausea or vomiting.  She did not bring her glucometer at this visit but states that her average glucose levels range 200s to low 300s.  ROS: see HPI for pertinent.  [place]: Reviewed. Smoking status reviewed. Medications reviewed.  Objective   BP 125/70   Pulse 75   SpO2 99%  Vitals and nursing note reviewed.  General: well nourished, well developed, NAD with non-toxic appearance HEENT: normocephalic, atraumatic, moist mucous membranes Neck: supple, non-tender without lymphadenopathy Cardiovascular: regular rate and rhythm with holosystolic murmur without rubs or gallops Lungs: clear to auscultation bilaterally with normal work of breathing Abdomen: soft, non-tender, non-distended, normoactive bowel sounds Skin: warm, dry, no rashes or lesions, cap refill < 2 seconds Extremities: warm and well perfused, normal tone, no edema Neuro: grossly intact throughout, on non-tremulous  Assessment & Plan   Uncontrolled type 2 diabetes mellitus with diabetic neuropathy, with long-term current use of insulin (HCC) History of poor control with occasional episodes  of hypoglycemia.  Concern today due to patient's history of hypoglycemia, irregular eating patterns, and failure to follow-up with glucometer.  She does have clear understanding of how to treat hypoglycemia and can tell when she has episodes due to feeling fatigued and tremulous. - Decreasing Tresiba to 35 units in the morning and continue NovoLog 4-8 units with meals and instructed not to take NovoLog if she knows she will skip a meal - Discussed importance of eating when glucose levels drop below 70 and advised to contact clinic - RTC 1 week and will follow up with glucometer  No orders of the defined types were placed in this encounter.  No orders of the defined types were placed in this encounter.   [name], [name], PGY-3 12/09/2018, 11:14 AM

## 2019-01-11 ENCOUNTER — Other Ambulatory Visit: Payer: Self-pay | Admitting: Family Medicine

## 2019-01-30 DIAGNOSIS — R69 Illness, unspecified: Secondary | ICD-10-CM | POA: Diagnosis not present

## 2019-02-07 ENCOUNTER — Other Ambulatory Visit: Payer: Self-pay | Admitting: *Deleted

## 2019-02-09 ENCOUNTER — Telehealth (INDEPENDENT_AMBULATORY_CARE_PROVIDER_SITE_OTHER): Payer: Medicare HMO | Admitting: Cardiology

## 2019-02-09 ENCOUNTER — Encounter: Payer: Self-pay | Admitting: Cardiology

## 2019-02-09 ENCOUNTER — Other Ambulatory Visit: Payer: Self-pay

## 2019-02-09 DIAGNOSIS — Z794 Long term (current) use of insulin: Secondary | ICD-10-CM | POA: Diagnosis not present

## 2019-02-09 DIAGNOSIS — I10 Essential (primary) hypertension: Secondary | ICD-10-CM

## 2019-02-09 DIAGNOSIS — IMO0002 Reserved for concepts with insufficient information to code with codable children: Secondary | ICD-10-CM

## 2019-02-09 DIAGNOSIS — I5042 Chronic combined systolic (congestive) and diastolic (congestive) heart failure: Secondary | ICD-10-CM

## 2019-02-09 DIAGNOSIS — E1165 Type 2 diabetes mellitus with hyperglycemia: Secondary | ICD-10-CM

## 2019-02-09 DIAGNOSIS — E1140 Type 2 diabetes mellitus with diabetic neuropathy, unspecified: Secondary | ICD-10-CM | POA: Diagnosis not present

## 2019-02-09 DIAGNOSIS — I471 Supraventricular tachycardia, unspecified: Secondary | ICD-10-CM

## 2019-02-09 MED ORDER — TORSEMIDE 20 MG PO TABS: 20.0000 mg | ORAL_TABLET | Freq: Two times a day (BID) | ORAL | 3 refills | Status: DC

## 2019-02-09 NOTE — Progress Notes (Signed)
Virtual Visit via Telephone Note   This visit type was conducted due to national recommendations for restrictions regarding the COVID-19 Pandemic (e.g. social distancing) in an effort to limit this patient's exposure and mitigate transmission in our community.  Due to her co-morbid illnesses, this patient is at least at moderate risk for complications without adequate follow up.  This format is felt to be most appropriate for this patient at this time.  The patient did not have access to video technology/had technical difficulties with video requiring transitioning to audio format only (telephone).  All issues noted in this document were discussed and addressed.  No physical exam could be performed with this format.  Please refer to the patient's chart for her  consent to telehealth for [facility].  Evaluation Performed:  Follow-up visit  This visit type was conducted due to national recommendations for restrictions regarding the COVID-19 Pandemic (e.g. social distancing).  This format is felt to be most appropriate for this patient at this time.  All issues noted in this document were discussed and addressed.  No physical exam was performed (except for noted visual exam findings with Video Visits).  Please refer to the patient's chart (MyChart message for video visits and phone note for telephone visits) for the patient's consent to telehealth for [facility].  Date:  02/09/2019   ID:  [name], DOB 1956-04-26, MRN [id number]  Patient Location:  Home  Provider location:   [place]  PCP:  [name], DO  Cardiologist:  Fransico Him, MD  Electrophysiologist:  None   Chief Complaint:  CHF/NIDCM  History of Present Illness:    [name] is a 63 y.o. female who presents via audio/video conferencing for a telehealth visit today.    Mercedes A Carteris a 63 y.o.femalefemalewith history ofDM, dyslipidemia (followed by PCP), HTN, GERD, tobacco abuse, bipolar  disorder, THC use, combined CHF/NICM, mitral regurgitation, tricuspid regurgitation and aortic regurgitation.  She was seen by me for atypical CP and diastolic dysfunction with normal EF in 2015. She was admitted 07/22/18 with progressive worsening dyspnea, orthopnea and lower extremity edema and was found to have new onset CHF with echo showing EF 45-50%, moderate DD, moderate MR, moderate TR, mild-mod AR. 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  Cardiac cath 07/26/18 showed no significant CAD - mild luminal irregularities of the RCA/LAD, low intracardiac filling pressures and preserved cardiac output. Cath hemodynamics were not suggestive of severe MR. Telemetry did show SVT felt to possibly represent nonsustained atrial tachycardia and outpatient event monitor was recommended.Seen back in clinic 08/03/18. Increased metoprolol for palpitations. Stopped Candesartan.  SHe is here today for followup and is doing well.  She has chronic DOE that is stable and has not worsened but thinks it is related to weight gain.  She has gained 8lbs since I saw her last.  [name] says that when she weighs in the 140 range she feels much better.  Occasionally she will have some dizziness but no presyncope.  She has intermittent LE edema which is very stable.  She denies any chest pain or pressure, PND, orthopnea, palpitations or syncope. She is compliant with her meds and is tolerating meds with no SE.    The patient does not have symptoms concerning for COVID-19 infection (fever, chills, cough, or new shortness of breath).   Prior CV studies:   The following studies were reviewed today:  none  Past Medical History:  Diagnosis Date  .  Allergy   . Anxiety   . Aortic insufficiency    a. mild-mod by echo 07/2018 and trivial by subsequent TEE  . Arthritis   . Bipolar 2 disorder ([place])   . Cataract   . Chronic combined systolic and diastolic CHF (congestive heart failure) ([place])   . Constipation    uses  [place] to go to [place] per pt  . Depression   . DM (diabetes mellitus), type 2 ([place])   . GERD (gastroesophageal reflux disease)   . HTN (hypertension)   . Hypercholesteremia   . Marijuana abuse   . 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 Moderate tricuspid regurgitation   . NICM (nonischemic cardiomyopathy) ([place])   . Renal insufficiency   . SVT (supraventricular tachycardia) ([facility])    nonsustained atrial tachycardia  . Trigger finger    r index inj 12/08, bilateral thumb inj 11/09, r thumb and dequervain's inj 01/2009   Past Surgical History:  Procedure Laterality Date  . ABDOMINAL HYSTERECTOMY  1986   fibroids  . CARDIAC CATHETERIZATION  05/06/2004   normal LV fxn EF>60%  . COLONOSCOPY    . cyst removed     cyst removed lower part of scalp in the back   . LEFT HEART CATH AND CORONARY ANGIOGRAPHY N/A 07/26/2018   Procedure: LEFT HEART CATH AND CORONARY ANGIOGRAPHY;  Surgeon: Sherren Mocha, MD;  Location: [place] CV [facility];  Service: Cardiovascular;  Laterality: N/A;  . LEFT HEART CATHETERIZATION WITH CORONARY ANGIOGRAM N/A 05/26/2011   Procedure: LEFT HEART CATHETERIZATION WITH CORONARY ANGIOGRAM;  Surgeon: Larey Dresser, MD;  Location: [facility] CATH LAB;  Service: Cardiovascular;  Laterality: N/A;  radial  . perirpheral iridectomy  02/12/2005  . TEE WITHOUT CARDIOVERSION N/A 08/05/2018   Procedure: TRANSESOPHAGEAL ECHOCARDIOGRAM (TEE);  Surgeon: Josue Hector, MD;  Location: [facility] ENDOSCOPY;  Service: Cardiovascular;  Laterality: N/A;     Current Meds  Medication Sig  . acetaminophen (TYLENOL) 500 MG tablet Take 1,000 mg by mouth every 6 (six) hours as needed for mild pain.  [name] atorvastatin (LIPITOR) 80 MG tablet Take 1 tablet (80 mg total) by mouth every morning.  . Blood Glucose Monitoring Suppl (ONE TOUCH ULTRA 2) W/DEVICE KIT 1 kit by Does not apply route once.  . candesartan (ATACAND) 4 MG tablet Take 1  tablet by mouth once daily  . famotidine (PEPCID) 40 MG tablet Take 1 tablet by mouth once daily  . feeding supplement, ENSURE ENLIVE, (ENSURE ENLIVE) LIQD Take 237 mLs by mouth 3 (three) times daily between meals.  . furosemide (LASIX) 80 MG tablet Take 80 mg, daily, by mouth, take one extra tablet, as needed, daily for SOB and edema or if you gain 3 lbs in a day or 5 lbs in a week.  . gabapentin (NEURONTIN) 100 MG capsule Take 1 capsule (100 mg total) by mouth 3 (three) times daily.  [name] glucose blood (ONE TOUCH ULTRA TEST) test strip USE 1 STRIP TO CHECK GLUCOSE THREE TIMES DAILY  . insulin aspart (NOVOLOG) 100 UNIT/ML injection Inject 4-8 Units into the skin 2 (two) times daily with a meal. (Patient taking differently: Inject 4-8 Units into the skin 2 (two) times daily with a meal. 3 times a day with meals)  . insulin degludec (TRESIBA FLEXTOUCH) 100 UNIT/ML SOPN FlexTouch Pen Inject 0.4 mLs (40 Units total) into the skin daily.  . Insulin Pen Needle 33G X 4  MM MISC 1 Units by Does not apply route 5 (five) times daily.  . Lancets (ONETOUCH ULTRASOFT) lancets Use as instructed  . metoprolol succinate (TOPROL-XL) 50 MG 24 hr tablet Take 2 tablet by mouth in the a.m, take 1 tablet by mouth in the p.m Take with or immediately following a meal. (Patient taking differently: Take 50 mg by mouth daily. )  . mirtazapine (REMERON) 15 MG tablet Take 1 tablet (15 mg total) by mouth at bedtime.  . ONE TOUCH LANCETS MISC 1 Device by Does not apply route 3 (three) times daily.     Allergies:   Ibuprofen and Lisinopril   Social History   Tobacco Use  . Smoking status: Former Smoker    Packs/day: 0.50    Years: 43.00    Pack years: 21.50    Types: Cigarettes  . Smokeless tobacco: Never Used  Substance Use Topics  . Alcohol use: Yes    Alcohol/week: 0.0 standard drinks    Comment: drinks socially occaisionally  . Drug use: Not Currently     Family Hx: The patient's family history includes Asthma in  her son and son; Bipolar disorder in her brother, mother, and sister; Diabetes in her brother, brother, brother, brother, father, mother, sister, sister, sister, sister, sister, and sister; Heart attack in her mother; Heart disease in her mother; Hypertension in her sister. There is no history of Colon cancer, Esophageal cancer, Colon polyps, Rectal cancer, or Stomach cancer.  ROS:   Please see the history of present illness.     All other systems reviewed and are negative.   Labs/Other Tests and Data Reviewed:    Recent Labs: 07/23/2018: Magnesium 1.9; TSH 2.620 09/11/2018: B Natriuretic Peptide 773.0 10/12/2018: ALT 44 10/13/2018: BUN 28; Creatinine, Ser 0.98; Hemoglobin 11.6; Platelets 268; Potassium 3.6; Sodium 139   Recent Lipid Panel Lab Results  Component Value Date/Time   CHOL 188 09/17/2018 10:16 AM   TRIG 148 09/17/2018 10:16 AM   HDL 65 09/17/2018 10:16 AM   CHOLHDL 2.9 09/17/2018 10:16 AM   CHOLHDL 3.5 07/23/2018 11:22 AM   LDLCALC 93 09/17/2018 10:16 AM   LDLDIRECT 237 (H) 06/25/2017 01:37 PM   LDLDIRECT 101 (H) 05/31/2013 02:21 PM    Wt Readings from Last 3 Encounters:  02/09/19 152 lb (68.9 kg)  12/01/18 142 lb (64.4 kg)  11/19/18 142 lb (64.4 kg)     Objective:    Vital Signs:  Ht _0  (1.575 m)   Wt 152 lb (68.9 kg)   BMI 27.80 kg/m    ASSESSMENT & PLAN:    1.  Chronic combined systolic/diastolic CHF -this is secondary to nonischemic dilated cardiomyopathy.  Cardiac cath earlier this year showed patent coronary arteries with luminal irregularities in the RCA and LAD.  She has gained 8lbs since I saw her last and has been more SOB.  Her LE edema, though, is very stable.  She is eating out mostly as it is quick for her and is usually subs with processed meat, pizza.  I think that poor compliance with Na is the main etiology of her SOB and weight gain.  I explained to her that she needs to avoid eating out and needs to eat mainly fruits and vegetables.  I am  going to refer her to the CHF nurse with [facility] clinic to help her with there diet and CHF teaching.  She currently takes Lasix 36m 2 tablets in the am and says that she is not urinating much.  I suspect she may not be absorbing this. I am going to change her to Torsemide 11m BID and have her followup with PA in [redacted] week along with BMET.    2.  Nonischemic dilated cardiomyopathy -2D echo showed mildly reduced LV function with EF 40 to 45%.  She will continue on beta-blocker and ARB.  3.  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  I will repeat 2D echo 07/2019 to make sure MR is stable.  4.  Hypertension - she does not check her BP at home.  Continue on Toprol XL 520mdaily and Candesartan 58m68maily.    5.  Diabetes mellitus type 2 -this is followed by her PCP and is poorly controlled.  Her last hemoglobin A1c was 10.6   6.  SVT - This was felt to be nonsustained atrial tachycardia.  She has not had any atrial fibrillation documented on event monitor.  She will continue on beta-blocker therapy.  COVID-19 Education: The signs and symptoms of COVID-19 were discussed with the patient and how to seek care for testing (follow up with PCP or arrange E-visit).  The importance of social distancing was discussed today.  Patient Risk:   After full review of this patient's clinical status, I feel that they are at least moderate risk at this time.  Time:   Today, I have spent 25 minutes directly with the patient on telephone discussing medical problems including CHF, HTN, SOB.  We also reviewed the symptoms of COVID 19 and the ways to protect against contracting the virus with telehealth technology.  I spent an additional 5 minutes reviewing patient's chart including labs, 2D echo.  Medication Adjustments/Labs and Tests Ordered: Current medicines are reviewed at length with the patient today.  Concerns regarding medicines are outlined above.   Tests Ordered: No orders of the defined types were placed in this encounter.  Medication Changes: No orders of the defined types were placed in this encounter.   Disposition:  Follow up in 1 week(s) with PA  Signed, TraFransico HimD  02/09/2019 9:29 AM    [place]

## 2019-02-09 NOTE — Patient Instructions (Signed)
Medication Instructions:  STOP: Lasix   START: Torsemide 20 mg twice a day   If you need a refill on your cardiac medications before your next appointment, please call your pharmacy.   Lab work: FUTURE: BMET in 1 week   If you have labs (blood work) drawn today and your tests are completely normal, you will receive your results only by: [name] MyChart Message (if you have MyChart) OR . A paper copy in the mail If you have any lab test that is abnormal or we need to change your treatment, we will call you to review the results.  Testing/Procedures: You have been referred to the heart failure clinic (Someone will contact you with more information)   Follow-Up: Follow up with APP in 1 week   Any Other Special Instructions Will Be Listed Below (If Applicable).

## 2019-02-09 NOTE — Addendum Note (Signed)
Addended by: Mendel Ryder on: 02/09/2019 11:46 AM   Modules accepted: Orders

## 2019-02-28 ENCOUNTER — Ambulatory Visit (INDEPENDENT_AMBULATORY_CARE_PROVIDER_SITE_OTHER): Payer: Medicare HMO | Admitting: Family Medicine

## 2019-02-28 ENCOUNTER — Encounter: Payer: Self-pay | Admitting: Family Medicine

## 2019-02-28 ENCOUNTER — Other Ambulatory Visit: Payer: Self-pay

## 2019-02-28 VITALS — BP 132/68 | HR 101 | Wt 147.4 lb

## 2019-02-28 DIAGNOSIS — E1165 Type 2 diabetes mellitus with hyperglycemia: Secondary | ICD-10-CM | POA: Diagnosis not present

## 2019-02-28 DIAGNOSIS — E1169 Type 2 diabetes mellitus with other specified complication: Secondary | ICD-10-CM

## 2019-02-28 DIAGNOSIS — E1140 Type 2 diabetes mellitus with diabetic neuropathy, unspecified: Secondary | ICD-10-CM | POA: Diagnosis not present

## 2019-02-28 DIAGNOSIS — R69 Illness, unspecified: Secondary | ICD-10-CM | POA: Diagnosis not present

## 2019-02-28 DIAGNOSIS — I10 Essential (primary) hypertension: Secondary | ICD-10-CM | POA: Diagnosis not present

## 2019-02-28 DIAGNOSIS — R7989 Other specified abnormal findings of blood chemistry: Secondary | ICD-10-CM

## 2019-02-28 DIAGNOSIS — I5042 Chronic combined systolic (congestive) and diastolic (congestive) heart failure: Secondary | ICD-10-CM

## 2019-02-28 DIAGNOSIS — Z23 Encounter for immunization: Secondary | ICD-10-CM | POA: Diagnosis not present

## 2019-02-28 DIAGNOSIS — Z794 Long term (current) use of insulin: Secondary | ICD-10-CM | POA: Diagnosis not present

## 2019-02-28 DIAGNOSIS — E1143 Type 2 diabetes mellitus with diabetic autonomic (poly)neuropathy: Secondary | ICD-10-CM

## 2019-02-28 DIAGNOSIS — I428 Other cardiomyopathies: Secondary | ICD-10-CM | POA: Insufficient documentation

## 2019-02-28 DIAGNOSIS — R945 Abnormal results of liver function studies: Secondary | ICD-10-CM | POA: Diagnosis not present

## 2019-02-28 DIAGNOSIS — F3341 Major depressive disorder, recurrent, in partial remission: Secondary | ICD-10-CM

## 2019-02-28 DIAGNOSIS — I38 Endocarditis, valve unspecified: Secondary | ICD-10-CM | POA: Insufficient documentation

## 2019-02-28 DIAGNOSIS — IMO0002 Reserved for concepts with insufficient information to code with codable children: Secondary | ICD-10-CM

## 2019-02-28 DIAGNOSIS — F3189 Other bipolar disorder: Secondary | ICD-10-CM

## 2019-02-28 DIAGNOSIS — E785 Hyperlipidemia, unspecified: Secondary | ICD-10-CM

## 2019-02-28 LAB — POCT GLYCOSYLATED HEMOGLOBIN (HGB A1C): HbA1c, POC (controlled diabetic range): 10.9 % — AB (ref 0.0–7.0)

## 2019-02-28 MED ORDER — METFORMIN HCL ER 500 MG PO TB24: 500.0000 mg | ORAL_TABLET | Freq: Every day | ORAL | 3 refills | Status: DC

## 2019-02-28 MED ORDER — MIRTAZAPINE 15 MG PO TABS: 15.0000 mg | ORAL_TABLET | Freq: Every day | ORAL | 2 refills | Status: DC

## 2019-02-28 NOTE — Progress Notes (Signed)
Cardiology Office Note    Date:  03/01/2019   ID:  [name], DOB 06-01-1956, MRN [id number]  PCP:  [name], DO  Cardiologist: Fransico Him, MD EPS: None  No chief complaint on file.   History of Present Illness:  [name] is a 63 y.o. female with history of hypertension, HLD, DM, tobacco abuse, nonischemic cardiomyopathy, diastolic CHF moderate MR.  New onset CHF 07/2018 LVEF 45 to 50% with moderate DD and moderate MR, moderate TR mild to moderate AR.  TEE showed rheumatic heart disease with mild MR moderate TR.  Cardiac cath 07/26/2018 no significant CAD low intracardiac filling pressures and preserved cardiac output.  Cath hemodynamics were not suggestive of severe MR.  Nonsustained atrial tachycardia treated with increase metoprolol.  Patient most recently had a telemedicine visit with Dr. Radford Pax 02/09/2019 at which time she had 8 pound weight gain and more shortness of breath.  She was eating out a lot.  Lasix was changed to torsemide 20 mg twice daily.  Patient here for f/u. Was in the process of moving and lost her torsemide so stayed on lasix and taking 80 mg BID. She has lost 5 lbs. Has cut back on salt and eating out. Breathing has improved some. Still short of breath with activity. HR still races some with activity. Her metoprolol was decreased back to 50 mg once daily because of low BP.  Patient does not think she is urinating on the Lasix like she used to.  Will switch back to torsemide once insurance will cover her prescription again.    Past Medical History:  Diagnosis Date  . Allergy   . Anxiety   . Aortic insufficiency    a. mild-mod by echo 07/2018 and trivial by subsequent TEE  . Arthritis   . Bipolar 2 disorder ([place])   . Cataract   . Chronic combined systolic and diastolic CHF (congestive heart failure) ([place])   . Constipation    uses [place] to go to [place] per pt  . Depression   . DM (diabetes mellitus), type 2 ([place])   . GERD  (gastroesophageal reflux disease)   . HTN (hypertension)   . Hypercholesteremia   . Marijuana abuse   . 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 Moderate tricuspid regurgitation   . NICM (nonischemic cardiomyopathy) ([place])   . Renal insufficiency   . SVT (supraventricular tachycardia) (HCC)    nonsustained atrial tachycardia  . Trigger finger    r index inj 12/08, bilateral thumb inj 11/09, r thumb and dequervain's inj 01/2009    Past Surgical History:  Procedure Laterality Date  . ABDOMINAL HYSTERECTOMY  1986   fibroids  . CARDIAC CATHETERIZATION  05/06/2004   normal LV fxn EF>60%  . COLONOSCOPY    . cyst removed     cyst removed lower part of scalp in the back   . LEFT HEART CATH AND CORONARY ANGIOGRAPHY N/A 07/26/2018   Procedure: LEFT HEART CATH AND CORONARY ANGIOGRAPHY;  Surgeon: Sherren Mocha, MD;  Location: [facility];  Service: Cardiovascular;  Laterality: N/A;  . LEFT HEART CATHETERIZATION WITH CORONARY ANGIOGRAM N/A 05/26/2011   Procedure: LEFT HEART CATHETERIZATION WITH CORONARY ANGIOGRAM;  Surgeon: Larey Dresser, MD;  Location: [facility] CATH LAB;  Service: Cardiovascular;  Laterality: N/A;  radial  . perirpheral iridectomy  02/12/2005  . TEE WITHOUT CARDIOVERSION N/A 08/05/2018   Procedure:  TRANSESOPHAGEAL ECHOCARDIOGRAM (TEE);  Surgeon: Josue Hector, MD;  Location: [facility] ENDOSCOPY;  Service: Cardiovascular;  Laterality: N/A;    Current Medications: Current Meds  Medication Sig  . acetaminophen (TYLENOL) 500 MG tablet Take 1,000 mg by mouth every 6 (six) hours as needed for mild pain.  [name] atorvastatin (LIPITOR) 80 MG tablet Take 1 tablet (80 mg total) by mouth every morning.  . Blood Glucose Monitoring Suppl (ONE TOUCH ULTRA 2) W/DEVICE KIT 1 kit by Does not apply route once.  . candesartan (ATACAND) 4 MG tablet Take 1 tablet by mouth once daily  . famotidine (PEPCID) 40 MG tablet Take 1  tablet by mouth once daily  . feeding supplement, ENSURE ENLIVE, (ENSURE ENLIVE) LIQD Take 237 mLs by mouth 3 (three) times daily between meals.  . gabapentin (NEURONTIN) 100 MG capsule Take 1 capsule (100 mg total) by mouth 3 (three) times daily.  [name] glucose blood (ONE TOUCH ULTRA TEST) test strip USE 1 STRIP TO CHECK GLUCOSE THREE TIMES DAILY  . insulin aspart (NOVOLOG) 100 UNIT/ML injection Inject 4-8 Units into the skin 2 (two) times daily with a meal. (Patient taking differently: Inject 4-8 Units into the skin 2 (two) times daily with a meal. 3 times a day with meals)  . insulin degludec (TRESIBA FLEXTOUCH) 100 UNIT/ML SOPN FlexTouch Pen Inject 0.4 mLs (40 Units total) into the skin daily.  . Insulin Pen Needle 33G X 4 MM MISC 1 Units by Does not apply route 5 (five) times daily.  . Lancets (ONETOUCH ULTRASOFT) lancets Use as instructed  . metFORMIN (GLUCOPHAGE-XR) 500 MG 24 hr tablet Take 1 tablet (500 mg total) by mouth daily.  . metoprolol succinate (TOPROL-XL) 50 MG 24 hr tablet Take 2 tablet by mouth in the a.m, take 1 tablet by mouth in the p.m Take with or immediately following a meal. (Patient taking differently: Take 50 mg by mouth daily. )  . mirtazapine (REMERON) 15 MG tablet Take 1 tablet (15 mg total) by mouth at bedtime.  . ONE TOUCH LANCETS MISC 1 Device by Does not apply route 3 (three) times daily.  [name] torsemide (DEMADEX) 20 MG tablet Take 1 tablet (20 mg total) by mouth 2 (two) times daily.     Allergies:   Ibuprofen and Lisinopril   Social History   Socioeconomic History  . Marital status: Married    Spouse name: Not on file  . Number of children: Not on file  . Years of education: Not on file  . Highest education level: Not on file  Occupational History  . 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 Financial resource strain: Not on file  . Food insecurity    Worry: Not on file     Inability: Not on file  . Transportation needs    Medical: Not on file    Non-medical: Not on file  Tobacco Use  . Smoking status: Former Smoker    Packs/day: 0.50    Years: 43.00    Pack years: 21.50    Types: Cigarettes  . Smokeless tobacco: Never Used  Substance and Sexual Activity  . Alcohol use: Yes    Alcohol/week: 0.0 standard drinks    Comment: drinks socially occaisionally  . Drug use: Not Currently  . Sexual activity: Never    Partners: Male  Lifestyle  . Physical activity    Days per week: Not on  file    Minutes per session: Not on file  . Stress: Not on file  Relationships  . Social Herbalist on phone: Not on file    Gets together: Not on file    Attends religious service: Not on file    Active member of club or organization: Not on file    Attends meetings of clubs or organizations: Not on file    Relationship status: Not on file  Other Topics Concern  . Not on file  Social History Narrative   Finances are a major issue. Meeting with [facility]. Rejected by healthcare sharing initiative   currently receiving med assistance for lantus through our office and lyrica.      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 Pt in process of obtaining disability.      Family History:  The patient's family history includes Asthma in her son and son; Bipolar disorder in her brother, mother, and sister; Diabetes in her brother, brother, brother, brother, father, mother, sister, sister, sister, sister, sister, and sister; Heart attack in her mother; Heart disease in her mother; Hypertension in her sister.   ROS:   Please see the history of present illness.    ROS All other systems reviewed and are negative.   PHYSICAL EXAM:   VS:  BP 138/70   Pulse 91   Ht 5' 2" (1.575 m)   Wt 147 lb 9.6 oz (67 kg)   SpO2 99%   BMI 27.00 kg/m    Physical Exam  GEN: Well nourished, well developed, in no acute distress  Neck: no JVD, carotid bruits, or masses Cardiac:RRR; 2/6 to 3/6 systolic murmur at the left sternal border Respiratory:  clear to auscultation bilaterally, normal work of breathing GI: soft, nontender, nondistended, + BS Ext: without cyanosis, clubbing, or edema, Good distal pulses bilaterally Neuro:  Alert and Oriented x 3, Psych: euthymic mood, full affect  Wt Readings from Last 3 Encounters:  03/01/19 147 lb 9.6 oz (67 kg)  02/28/19 147 lb 6.4 oz (66.9 kg)  02/09/19 152 lb (68.9 kg)      Studies/Labs Reviewed:   EKG:  EKG is not ordered today. Recent Labs: 07/23/2018: Magnesium 1.9; TSH 2.620 09/11/2018: B Natriuretic Peptide 773.0 10/12/2018: ALT 44 10/13/2018: Hemoglobin 11.6; Platelets 268 02/28/2019: BUN 11; Creatinine, Ser 1.00; Potassium 4.7; Sodium 141   Lipid Panel    Component Value Date/Time   CHOL 188 09/17/2018 1016   TRIG 148 09/17/2018 1016   HDL 65 09/17/2018 1016   CHOLHDL 2.9 09/17/2018 1016   CHOLHDL 3.5 07/23/2018 1122   VLDL 18 07/23/2018 1122   LDLCALC 93 09/17/2018 1016   LDLDIRECT 237 (H) 06/25/2017 1337   LDLDIRECT 101 (H) 05/31/2013 1421    Additional studies/ records that were reviewed today include:    2D echo 2/7/20IMPRESSIONS      1. The left ventricle has mildly reduced systolic function of 44-92%. The cavity size was normal. There is mildly increased left ventricular wall thickness. Echo evidence of pseudonormalization in diastolic relaxation.  2. The right ventricle has normal systolic function. The cavity was normal. There is no increase in right ventricular wall thickness.  3. Left atrial size was severely dilated.  4. Trivial pericardial effusion.  5. The anterior mitral valve leaflet appears partially restricted. There is mild thickening. Mitral valve regurgitation is moderate by color  flow Doppler.  6. PISA measurements are consistent with mild MR. However,  visually it is at least moderate. There appears to be mild restriction of the anterior mitral valve leaflet. This can be seen with ischemia or degenerative changes. Would consider cardiology  consultation and consider ischemia evaluation as well as TEE to better evaluate the mitral valve.  7. The tricuspid valve is normal in structure. Tricuspid valve regurgitation is moderate.  8. The aortic valve is normal in structure. There is mild thickening and mild calcification of the aortic valve. Aortic valve regurgitation is mild to moderate by color flow Doppler.  9. The pulmonic valve was normal in structure.   FINDINGS  Left Ventricle: The left ventricle has mildly reduced systolic function of 29-47%. The cavity size was normal. There is mildly increased left ventricular wall thickness. Echo evidence of pseudonormalization in diastolic relaxation Hypokinesis of the  basal to mid anteroseptal and inferoseptal myocardium. Right Ventricle: The right ventricle has normal systolic function. The cavity was normal. There is no increase in right ventricular wall thickness. Left Atrium: left atrial size was severely dilated Right Atrium: right atrial size was normal in size Interatrial Septum: No atrial level shunt detected by color flow Doppler.   Pericardium: Trivial pericardial effusion is present. Mitral Valve: The anterior mitral valve leaflet appears partially restricted. There is mild thickening. Mitral valve regurgitation is moderate by color flow Doppler. PISA measurements are consistent with mild MR. However, visually it is at least  moderate. There appears to be mild restriction of the anterior mitral valve leaflet. This can be seen with ischemia or degenerative changes. Would consider cardiology consultation and consider ischemia evaluation as well as TEE to better evaluate the  mitral valve. Tricuspid Valve: The tricuspid valve is normal in structure. Tricuspid valve regurgitation is moderate by  color flow Doppler. Aortic Valve: The aortic valve is normal in structure. There is mild thickening and mild calcification of the aortic valve. Aortic valve regurgitation is mild to moderate by color flow Doppler. Pulmonic Valve: The pulmonic valve was normal in structure. Pulmonic valve regurgitation is trivial by color flow Doppler. Venous: The inferior vena cava is normal in size with greater than 50% respiratory variability.   Cardiac catheterization 2/20201. Patent coronary arteries with no significant obstructive disease - mild luminal irregularities noted in the RCA and LAD 2. Low intracardiac filling pressures 3. Preserved cardiac output   Continue medical therapy. Plans noted for TEE to further assess mitral valve. Cath hemodynamics not suggestive of severe MR, but may be function component to MR and patient appears to be well-compensated at present.     ASSESSMENT:    1. Chronic combined systolic and diastolic CHF (congestive heart failure) ([place])   2. Nonischemic cardiomyopathy ([place])   3. Primary hypertension   4. SVT (supraventricular tachycardia) ([place])   5. Hyperlipidemia due to type 2 diabetes mellitus ([place])   6. Valvular heart disease      PLAN:  In order of problems listed above:  Chronic combined systolic and diastolic CHF Lasix changed to torsemide last office visit and referred to heart failure nurse for dietary teaching.  Unfortunately the patient lost her torsemide prescription while moving.  She is taking Lasix 80 mg twice daily.  She is lost 5 pounds and has no heart failure on exam today.  Kidney function checked yesterday by PCP was normal.  Patient still wants to change to torsemide as she does not feel the Lasix is working like it used to.  Check be met once she makes this transition.  Nonischemic cardiomyopathy ejection fraction 45 to 50% on echo 07/2018 cardiac cath at that time showed nonobstructive CAD  Essential hypertension blood pressure controlled.   Patient says her metoprolol was decreased to 50 mg once daily because of low blood pressure.  SVT felt to be unsustained atrial tachycardia-sounds like she is still having some breakthrough with activity since metoprolol was decreased.  Follow-up with Dr. Radford Pax in 2 months.  Diabetes mellitus type 2 followed by PCP  Valvular heart disease with mild MR moderate TR on echo 07/2018    Medication Adjustments/Labs and Tests Ordered: Current medicines are reviewed at length with the patient today.  Concerns regarding medicines are outlined above.  Medication changes, Labs and Tests ordered today are listed in the Patient Instructions below. There are no Patient Instructions on file for this visit.   Signed, Ermalinda Barrios, PA-C  03/01/2019 10:14 AM    [facility] [address], [place], [place]  [address] Phone: [phone]; Fax: [phone]

## 2019-02-28 NOTE — Progress Notes (Signed)
Subjective:   Patient ID: [name]    DOB: July 23, 1955, 63 y.o. female   MRN: [id number]  Crystall ANAID [name] is a 63 y.o. female with a history of combined systolic/diastolic HF, HTN, COPD, gastroparesis, GERD, uncontrolled T2DM with neuropathy, HLD,  here for diabetes follow up.  Type 2 Diabetes: Last A1C 13.7, A1C today 10.9. Currently takes 35U Tresiba qAM and Novolog 4-8 units with meals. CBG 56-400. She notes she doesn't get low blood sugars that often, but when it happens she gets sweaty and she'll check her blood sugar and eat something. She notes the problem is with her diet. She does not have much have an appetite. She has to take Mirtazipine which helps her appetite, her mood disorder, and her sleep. However, she can go all day without eating, typically doesn't eat a real meal except maybe twice a week. She notes she doesn't eat breakfast. For lunch she will eat chips and a sandwich or juice. She snacks on candy or fruit throughout the day. She does note she is voiding a lot but she associates this with her lasix. She was recently switched to Torsemide 20mg  but accidentally threw away the prescription so she is taking Lasix 80mg . She denies any polydipsia and polyphagia, lower extremity swelling, change in vision, shortness of breath or worsening or new chest pain or nausea or vomiting.  History of taking metformin ER but self discontinued stating she "had diarrhea". She is interested in talking to a diabetic coach or nutritionist. She endorses difficulty with urination at times where she has the urge but has difficulty urinating.   HTN: BP 132/68 today. Currently taking Toprol XL 50mg  QD and Candesartan 4mg  QD. Endorses compliance. Denies any chest pain, SOB, LE edema, headaches, or vision changes. Follows regularly with cardiologist Dr. Radford Pax.  HLD:  Currently taking Atorvastatin 80mg  QD. Denies any new or worsening muscle aches or weakness.  Health Maintenance: Due for Diabetic  eye exam, Flu vaccine, and Diabetic foot exam.   Review of Systems:  Per HPI.   [place], medications and smoking status reviewed.  Objective:   BP 132/68   Pulse (!) 101   Wt 147 lb 6.4 oz (66.9 kg)   SpO2 98%   BMI 26.96 kg/m  Vitals and nursing note reviewed.  General: well nourished, well developed, in no acute distress with non-toxic appearance, sitting comfortably in exam chair HEENT: normocephalic, atraumatic, moist mucous membranes CV: regular rate and rhythm, soft murmur appreciated, no lower extremity edema Lungs: clear to auscultation bilaterally with normal work of breathing on room air Abdomen: soft, non-tender, non-distended, normoactive bowel sounds Skin: warm, dry Extremities: warm and well perfused MSK: gait normal Neuro: Alert and oriented, speech normal  Diabetic Foot Exam - Simple   Simple Foot Form Visual Inspection No deformities, no ulcerations, no other skin breakdown bilaterally: Yes Sensation Testing See comments: Yes Pulse Check Posterior Tibialis and Dorsalis pulse intact bilaterally: Yes Comments Decreased sensation along toes bilaterally.       Assessment & Plan:   Uncontrolled type 2 diabetes mellitus with diabetic neuropathy, with long-term current use of insulin (HCC) A1C improved but still uncontrolled (13>10). Blood sugars appear to primarily range in the 200-300's. She has been on Metformin XR in the past (transitioned from IR 2/2 to GI symptoms) but no documentation on why this was discontinued. Discussed management options including addition of SGLT-2 vs restarting Metformin XR at lower dose. Hesitant to adjust Tyler Aas given episodes of hypoglycemia and irregular  eating habits. Patient was amendable to restarting Metformin XR at 500mg  QD. She was instructed to take with a meal. We will plan to follow up with a telemedicine visit in 1 month to check in on how she is tolerating this medicine. She was instructed to keep blood sugar log. Can  consider increase to 750mg  QD if tolerating. Can consider transition to SGLT-2 given history of heart failure and CV benefit if not tolerating metformin. However unsure if her insurance will cover. I would also like her to become stable on her new Torsemide before starting another medicine that affects kidney filtration. Plan for a 3 month follow up for repeat A1C.  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 No signs of deformities, ulcers, or skin breakdown. Symptoms well controlled with Gabapentin. Educated on importance of good blood sugar control to avoid worsening in neuropathy symptoms. Patient understood.  - continue Gabapentin as prescribed   Elevated LFTs History of elevated LFT's. Will obtain hepatic panel to evaluate. Consider RUQ ultrasound if continues to remain elevated. Denied any abdominal pain.  Primary hypertension Normotensive today. Endorses compliance to Toprol XL 50mg  and Candesartan 4mg  QD. Followed closely with cardiology. - BMP today to monitor kidney function  - continue meds as prescribed  Hyperlipidemia due to type 2 diabetes mellitus (HCC) Stable on Atorvastatin 80mg  QD without any side effects. Endorses compliance. - continue statin as above  Health Maintenance: Diabetic foot exam performed today Ophthalmology referral placed for eye exam  Flu shot provided today  Orders Placed This Encounter  Procedures  . Flu Vaccine QUAD 36+ mos IM  . Basic Metabolic Panel  . Ambulatory referral to Ophthalmology     Referral Priority:   Routine    Referral Type:   Consultation    Referral Reason:   Specialty Services Required    Requested Specialty:   Ophthalmology    Number of Visits Requested:   1  . HgB A1c   Meds ordered this encounter  Medications  . metFORMIN (GLUCOPHAGE-XR) 500 MG 24 hr tablet    Sig: Take 1 tablet (500 mg total) by mouth daily.    Dispense:  180 tablet    Refill:  3  . mirtazapine (REMERON) 15 MG tablet    Sig: Take 1 tablet (15 mg total) by mouth at bedtime.    Dispense:  30 tablet    Refill:  2    [name], DO PGY-2, [facility] 02/28/2019 6:57 PM

## 2019-02-28 NOTE — Assessment & Plan Note (Signed)
A1C improved but still uncontrolled (13>10). Blood sugars appear to primarily range in the 200-300's. She has been on Metformin XR in the past (transitioned from IR 2/2 to GI symptoms) but no documentation on why this was discontinued. Discussed management options including addition of SGLT-2 vs restarting Metformin XR at lower dose. Hesitant to adjust Tyler Aas given episodes of hypoglycemia and irregular eating habits. Patient was amendable to restarting Metformin XR at 500mg  QD. She was instructed to take with a meal. We will plan to follow up with a telemedicine visit in 1 month to check in on how she is tolerating this medicine. She was instructed to keep blood sugar log. Can consider increase to 750mg  QD if tolerating. Can consider transition to SGLT-2 given history of heart failure and CV benefit if not tolerating metformin. However unsure if her insurance will cover. I would also like her to become stable on her new Torsemide before starting another medicine that affects kidney filtration. Plan for a 3 month follow up for repeat A1C.  - Continue Tresiba 35U QD and Novolog sliding scale with meals - Blood sugar log  - Start Metformin XL 500mg  QD with meal - Follow up in 1 month via Telemedicine for follow up - Follow up in office in 3 months for A1C recheck - consider change to SGLT-2 if not tolerating metformin, but will await stabilization on new Torsemide medicine  - BMET today to evaluate kidney function

## 2019-02-28 NOTE — Assessment & Plan Note (Signed)
Diabetic foot exam notable for decreases sensation in toes bilaterally. No signs of deformities, ulcers, or skin breakdown. Symptoms well controlled with Gabapentin. Educated on importance of good blood sugar control to avoid worsening in neuropathy symptoms. Patient understood.  - continue Gabapentin as prescribed

## 2019-02-28 NOTE — Assessment & Plan Note (Signed)
Normotensive today. Endorses compliance to Toprol XL 50mg  and Candesartan 4mg  QD. Followed closely with cardiology. - BMP today to monitor kidney function  - continue meds as prescribed

## 2019-02-28 NOTE — Assessment & Plan Note (Signed)
History of elevated LFT's. Will obtain hepatic panel to evaluate. Consider RUQ ultrasound if continues to remain elevated. Denied any abdominal pain.

## 2019-02-28 NOTE — Patient Instructions (Addendum)
Thank you so much for coming to see me today! IT was so nice to meet you.  These continue your Tresiba at 35 units once a day and your Novolog sliding scale insulin.  These keep a log of your blood sugars so we can go over them.  Please start the Metformin. Take this medicine once a day every day with a fatty meal. Let me know if you develop any side effects.  You are scheduled for a follow up telemedicine follow up on October 12th. I look forward to checking in with you.  I would like to see you in person in 3 months to get repeat labs and follow up.  I have sent in a referral to an eye doctor to have your diabetic eye exam.  Please let me know if you do not hear from anyone in 2-3 weeks and I will send in another referral.   Thanks so much and please contact me if you have any questions or concerns.  Take care, Dr. Tarry Kos

## 2019-02-28 NOTE — Assessment & Plan Note (Signed)
Stable on Atorvastatin 80mg  QD without any side effects. Endorses compliance. - continue statin as above

## 2019-02-28 NOTE — Progress Notes (Signed)
a1c

## 2019-03-01 ENCOUNTER — Encounter: Payer: Self-pay | Admitting: Physician Assistant

## 2019-03-01 ENCOUNTER — Other Ambulatory Visit: Payer: Medicare HMO

## 2019-03-01 ENCOUNTER — Ambulatory Visit (INDEPENDENT_AMBULATORY_CARE_PROVIDER_SITE_OTHER): Payer: Medicare HMO | Admitting: Physician Assistant

## 2019-03-01 VITALS — BP 138/70 | HR 91 | Ht 62.0 in | Wt 147.6 lb

## 2019-03-01 DIAGNOSIS — I5042 Chronic combined systolic (congestive) and diastolic (congestive) heart failure: Secondary | ICD-10-CM

## 2019-03-01 DIAGNOSIS — I38 Endocarditis, valve unspecified: Secondary | ICD-10-CM | POA: Diagnosis not present

## 2019-03-01 DIAGNOSIS — E785 Hyperlipidemia, unspecified: Secondary | ICD-10-CM | POA: Diagnosis not present

## 2019-03-01 DIAGNOSIS — I428 Other cardiomyopathies: Secondary | ICD-10-CM

## 2019-03-01 DIAGNOSIS — I10 Essential (primary) hypertension: Secondary | ICD-10-CM | POA: Diagnosis not present

## 2019-03-01 DIAGNOSIS — I471 Supraventricular tachycardia: Secondary | ICD-10-CM | POA: Diagnosis not present

## 2019-03-01 DIAGNOSIS — E1169 Type 2 diabetes mellitus with other specified complication: Secondary | ICD-10-CM

## 2019-03-01 LAB — BASIC METABOLIC PANEL
BUN/Creatinine Ratio: 11 — ABNORMAL LOW (ref 12–28)
BUN: 11 mg/dL (ref 8–27)
CO2: 27 mmol/L (ref 20–29)
Calcium: 10.6 mg/dL — ABNORMAL HIGH (ref 8.7–10.3)
Chloride: 99 mmol/L (ref 96–106)
Creatinine, Ser: 1 mg/dL (ref 0.57–1.00)
GFR calc Af Amer: 69 mL/min/{1.73_m2} (ref >59)
GFR calc non Af Amer: 60 mL/min/{1.73_m2} (ref >59)
Glucose: 269 mg/dL — ABNORMAL HIGH (ref 65–99)
Potassium: 4.7 mmol/L (ref 3.5–5.2)
Sodium: 141 mmol/L (ref 134–144)

## 2019-03-01 MED ORDER — TORSEMIDE 20 MG PO TABS: 20.0000 mg | ORAL_TABLET | Freq: Two times a day (BID) | ORAL | 3 refills | Status: DC

## 2019-03-01 NOTE — Patient Instructions (Addendum)
Medication Instructions:  Your physician has recommended you make the following change in your medication:  Start Torsemide 20 mg Twice Daily  If you need a refill on your cardiac medications before your next appointment, please call your pharmacy.   Lab work: Your physician recommends that you return for lab work on October 5, 9 am BMET  If you have labs (blood work) drawn today and your tests are completely normal, you will receive your results only by: [name] MyChart Message (if you have MyChart) OR . A paper copy in the mail If you have any lab test that is abnormal or we need to change your treatment, we will call you to review the results.  Testing/Procedures: None Ordered  Follow-Up: At [facility], you and your health needs are our priority.  As part of our continuing mission to provide you with exceptional heart care, we have created designated Provider Care Teams.  These Care Teams include your primary Cardiologist (physician) and Advanced Practice Providers (APPs -  Physician Assistants and Nurse Practitioners) who all work together to provide you with the care you need, when you need it. You will need a follow up appointment in 2 months.  Please call our office 2 months in advance to schedule this appointment.  You may see Fransico Him, MD or one of the following Advanced Practice Providers on your designated Care Team:   [place], PA-C Melina Copa, PA-C . Ermalinda Barrios, PA-C  Any Other Special Instructions Will Be Listed Below (If Applicable).

## 2019-03-03 LAB — HEPATIC FUNCTION PANEL
ALT: 14 IU/L (ref 0–32)
AST: 19 IU/L (ref 0–40)
Albumin: 4.5 g/dL (ref 3.8–4.8)
Alkaline Phosphatase: 140 IU/L — ABNORMAL HIGH (ref 39–117)
Bilirubin Total: 0.6 mg/dL (ref 0.0–1.2)
Bilirubin, Direct: 0.14 mg/dL (ref 0.00–0.40)
Total Protein: 7.9 g/dL (ref 6.0–8.5)

## 2019-03-03 LAB — SPECIMEN STATUS REPORT

## 2019-03-03 NOTE — Progress Notes (Signed)
Liver function appears to have resolved. Unclear etiology for past elevation. Will defer RUQ ultrasound at this time and continue to monitor.

## 2019-03-21 ENCOUNTER — Other Ambulatory Visit: Payer: Medicare HMO

## 2019-03-28 ENCOUNTER — Encounter: Payer: Self-pay | Admitting: Family Medicine

## 2019-03-28 ENCOUNTER — Ambulatory Visit (INDEPENDENT_AMBULATORY_CARE_PROVIDER_SITE_OTHER): Payer: Medicare HMO | Admitting: Family Medicine

## 2019-03-28 ENCOUNTER — Other Ambulatory Visit: Payer: Self-pay

## 2019-03-28 VITALS — BP 130/80 | HR 87 | Wt 153.4 lb

## 2019-03-28 DIAGNOSIS — F3341 Major depressive disorder, recurrent, in partial remission: Secondary | ICD-10-CM | POA: Diagnosis not present

## 2019-03-28 DIAGNOSIS — Z794 Long term (current) use of insulin: Secondary | ICD-10-CM

## 2019-03-28 DIAGNOSIS — R0609 Other forms of dyspnea: Secondary | ICD-10-CM

## 2019-03-28 DIAGNOSIS — R69 Illness, unspecified: Secondary | ICD-10-CM | POA: Diagnosis not present

## 2019-03-28 DIAGNOSIS — F3189 Other bipolar disorder: Secondary | ICD-10-CM

## 2019-03-28 DIAGNOSIS — E1140 Type 2 diabetes mellitus with diabetic neuropathy, unspecified: Secondary | ICD-10-CM | POA: Diagnosis not present

## 2019-03-28 DIAGNOSIS — R0600 Dyspnea, unspecified: Secondary | ICD-10-CM | POA: Diagnosis not present

## 2019-03-28 DIAGNOSIS — IMO0002 Reserved for concepts with insufficient information to code with codable children: Secondary | ICD-10-CM

## 2019-03-28 DIAGNOSIS — E1165 Type 2 diabetes mellitus with hyperglycemia: Secondary | ICD-10-CM | POA: Diagnosis not present

## 2019-03-28 DIAGNOSIS — I5042 Chronic combined systolic (congestive) and diastolic (congestive) heart failure: Secondary | ICD-10-CM | POA: Diagnosis not present

## 2019-03-28 DIAGNOSIS — R7989 Other specified abnormal findings of blood chemistry: Secondary | ICD-10-CM

## 2019-03-28 DIAGNOSIS — K219 Gastro-esophageal reflux disease without esophagitis: Secondary | ICD-10-CM

## 2019-03-28 MED ORDER — TORSEMIDE 20 MG PO TABS: 20.0000 mg | ORAL_TABLET | Freq: Two times a day (BID) | ORAL | 3 refills | Status: AC

## 2019-03-28 MED ORDER — FAMOTIDINE 40 MG PO TABS: 40.0000 mg | ORAL_TABLET | Freq: Every day | ORAL | 3 refills | Status: DC

## 2019-03-28 MED ORDER — MIRTAZAPINE 15 MG PO TABS: 15.0000 mg | ORAL_TABLET | Freq: Every day | ORAL | 2 refills | Status: DC

## 2019-03-28 MED ORDER — DAPAGLIFLOZIN PROPANEDIOL 5 MG PO TABS: 5.0000 mg | ORAL_TABLET | Freq: Every day | ORAL | 3 refills | Status: DC

## 2019-03-28 NOTE — Progress Notes (Signed)
Subjective:   Patient ID: [name]    DOB: 24-Mar-1956, 63 y.o. female   MRN: [id number]  [name] is a 63 y.o. female with a history of HTN, COPD, GERD, T2DM, HLD, peripheral neuropathy, bipolar disorder, depression/anxiety, fibromyalgia here for diabetes follow up.  Uncontrolled T2DM: Patient seen 1 month ago for uncontrolled T2DM. Hgb A1C 13>10. She was restarted on Metformin XR 500mg  QD in addition to her Tresiba 35U QD and Novolog sliding scale (average 4-8 units). She notes her Blood sugars range from 150-250, however occasionally has BS to 450's. She notes she was not able to tolerate the Metformin due to nausea, vomiting, and GI upset. She gave it a try for 1 week but was unable to tolerate it thus discontinued it. She notes she has seen a dietician which is helping with her meals and proper diabetes logging.   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 Patient notes worsening SOB and dizziness with activity. Unable to walk as far without getting SOB. Patient denies having to sleep on more pillows at night or difficulty breathing while lying flat. Denies any LE swelling. Patient does note that her stomach does feels more distended. This has been happening off and on x 1 month. Followed by cardiologist Ermalinda Barrios, PA. She was started on Torsemide but unfortunately lost her rx prior to starting. She has been taking Lasix 80mg  QD, however she doesn't feel like its working as well. She also has a history of SVT that appears to occur more often with activity. Her metoprolol was decreased to 50mg  QD due to soft blood pressures. She is supposed to follow up with Dr. Radford Pax on 05/03/2019. Denies any chest pain. Endorses chronic cough that is unchanged. Denies any fevers or chills. Weight is up 6lbs. She is able to get the Torsemide 20mg  BID starting on the 15th. She plans to get refilled today.    Review of Systems:  Per HPI.   [place], medications and smoking status reviewed.  Objective:   BP 130/80   Pulse 87   Wt 153 lb 6.4 oz (69.6 kg)   SpO2 97%   BMI 28.06 kg/m  Vitals and nursing note reviewed.  General: well nourished, well developed, in no acute distress with non-toxic appearance, sitting in exam chair, appears uncomfortable every time she moves her left hip  CV: regular rate and rhythm without murmurs, rubs, or gallops, no lower extremity edema Lungs: intermittent fine crackles in lower left lobe, normal work up breathing on room air Abdomen: soft, nontender, moderately distended, normoactive bowel sounds Skin: warm, dry Extremities: warm and well perfused Neuro: Alert and oriented, speech normal  Assessment & Plan:   Elevated LFTs Resolved. LFTs on most recent hepatic panel WNL.   Uncontrolled type 2 diabetes mellitus with diabetic neuropathy, with long-term current use of insulin (HCC) Uncontrolled but improving based on last A1C (13>10). Intolerant to Metformin IR and ER. Currently on [place]. Working with dietician which she is finding helpful. Will start SGLT-2 given history of heart failure and CV benefit.  - Start [place] 5mg  QD  - Instructed patient to contact me if she has difficulty getting her insurance to cover the [place]. Can attempt Invokana 100mg  QD  - RTC in 1 month for diabetes follow up - Plan to obtain BMP at follow up visit to evaluate kidney function with SGLT-2 - Repeat A1C in December 2020  GERD (gastroesophageal reflux disease) Pepcid  refill request. Rx ordered.  Dyspnea on exertion Reports acute on chronic worsening dyspnea with exertion. Unclear etiology however some concern for early onset decompensated HF vs new onset right-sided HF. Patient denied any orthopnea, PND, or LE edema, however patient is up 6-8lbs from estimated dry weight and abdomen is distended. Has been taking Lasix 80mg  however feels this is no longer  effective. Was started on Torsemide however lost her previous prescription thus has not officially started this medication. Denies any chest pain or cough/fever/chills to indicate ACS or infection (PNA, COPD). Vital signs stable with 97% O2 sats on room air. LFT's, Protein and albumin levels on CMP in Sept 2020 was WNL. Normal coronary arteries on Cardiac cath in Feb 2020. Does have a history of MR which could be contributing.  Patient also has history of SVT that per cardiology was felt to be more consistent with unsustained atrial tachycardia. She appears to have some breakthrough tachycardia with activity since her Metoprolol was decreased for soft blood pressures. She was instructed to follow up with Dr. Radford Pax in November 2020. This may be contributing to her symptoms as well. Lower on differential includes anemia vs liver dysfunction. Had very long discussion in regards to this. Appears patient is able to pick up next RX of torsemide. Called in new rx of Torsemide and instructed patient to start taking immediately. If she is unable to obtain rx, then she should increase her Lasix to 160mg  BID. Patient was also instructed to call her cardiologist to schedule an appointment for this week. If unable to get in with her cardiologist this week, she should be seen by our clinic for follow up on her symptoms. Teach back method was practiced to ensure understanding.  - at follow up if no improvement in symptoms: recommend CBC to evaluate for anemia, CMP to evaluate albumin/protein levels and liver function, and consider repeat echocardiogram   History of Left Greater Trochanteric pain: At end of visit patient notes worsening left hip pain that is similar in presentation to prior pain. Has been seen by sports medicine in April 2020. She received steroid injection with much improvement in her pain. Given acuity of above illnesses, recommended patient follow up with me at earliest convenience or schedule follow up  visit with sports medicine. She was agreeable to calling [facility] for follow up appointment   No orders of the defined types were placed in this encounter.  Meds ordered this encounter  Medications  . dapagliflozin propanediol (FARXIGA) 5 MG TABS tablet    Sig: Take 5 mg by mouth daily.    Dispense:  90 tablet    Refill:  3  . torsemide (DEMADEX) 20 MG tablet    Sig: Take 1 tablet (20 mg total) by mouth 2 (two) times daily.    Dispense:  60 tablet    Refill:  3  . famotidine (PEPCID) 40 MG tablet    Sig: Take 1 tablet (40 mg total) by mouth daily.    Dispense:  30 tablet    Refill:  3  . mirtazapine (REMERON) 15 MG tablet    Sig: Take 1 tablet (15 mg total) by mouth at bedtime.    Dispense:  30 tablet    Refill:  2       [name], DO PGY-2, [facility] 03/30/2019 11:31 PM

## 2019-03-28 NOTE — Patient Instructions (Addendum)
Thank you so much for coming to see me today.  I have started you on a new medicine called Farxiga 5mg , take this medicine once a day. PLEASE let me know if you are unable to afford medicine/insurance does not cover this medicine. I will call in an alternative.   Please see me in 1 month for follow up of diabetes.  I am concerned your shortness of breath is either from your heart failure or arrhythmia. I recommend you restart the Torsemide 20mg  - take medicine twice a day. PLEASE call your cardiologist to be seen ASAP. If you are unable to pick up the Torsemide, then let me know and I will call in more Lasix. I would like to increase the lasix to 160mg  in that case. Please call me to keep me updated. Please be sure to follow up with me or your cardiologist in 1 week to ensure your symptoms are getting better.  Please also weight yourself every day, if you gain >3 pounds in one day then you need to be seen by a doctor.   Please schedule appointment with sports medicine for evaluation of your left hip.   Take care, Dr. Tarry Kos

## 2019-03-30 NOTE — Assessment & Plan Note (Addendum)
Uncontrolled but improving based on last A1C (13>10). Intolerant to Metformin IR and ER. Currently on [place]. Working with dietician which she is finding helpful. Will start SGLT-2 given history of heart failure and CV benefit.  - Start Farxiga 5mg  QD  - Instructed patient to contact me if she has difficulty getting her insurance to cover the [place]. Can attempt Invokana 100mg  QD  - RTC in 1 month for diabetes follow up - Plan to obtain BMP at follow up visit to evaluate kidney function with SGLT-2 - Repeat A1C in December 2020

## 2019-03-30 NOTE — Assessment & Plan Note (Addendum)
Reports acute on chronic worsening dyspnea with exertion. Unclear etiology however some concern for early onset decompensated HF vs new onset right-sided HF. Patient denied any orthopnea, PND, or LE edema, however patient is up 6-8lbs from estimated dry weight and abdomen is distended. Has been taking Lasix 80mg  however feels this is no longer effective. Was started on Torsemide however lost her previous prescription thus has not officially started this medication. Denies any chest pain or cough/fever/chills to indicate ACS or infection (PNA, COPD). Vital signs stable with 97% O2 sats on room air. LFT's, Protein and albumin levels on CMP in Sept 2020 was WNL. Normal coronary arteries on Cardiac cath in Feb 2020. Does have a history of MR which could be contributing.  Patient also has history of SVT that per cardiology was felt to be more consistent with unsustained atrial tachycardia. She appears to have some breakthrough tachycardia with activity since her Metoprolol was decreased for soft blood pressures. She was instructed to follow up with Dr. Radford Pax in November 2020. This may be contributing to her symptoms as well. Lower on differential includes anemia vs liver dysfunction. Had very long discussion in regards to this. Appears patient is able to pick up next RX of torsemide. Called in new rx of Torsemide and instructed patient to start taking immediately. If she is unable to obtain rx, then she should increase her Lasix to 160mg  BID. Patient was also instructed to call her cardiologist to schedule an appointment for this week. If unable to get in with her cardiologist this week, she should be seen by our clinic for follow up on her symptoms. Teach back method was practiced to ensure understanding.  - at follow up if no improvement in symptoms: recommend CBC to evaluate for anemia, CMP to evaluate albumin/protein levels and liver function, and consider repeat echocardiogram

## 2019-03-30 NOTE — Assessment & Plan Note (Signed)
Resolved. LFTs on most recent hepatic panel WNL.

## 2019-03-30 NOTE — Assessment & Plan Note (Deleted)
Reports acute on chronic worsening dyspnea with exertion. Unclear etiology however some concern for early onset decompensated HF. Patient denied any orthopnea, PND, or LE edema, however patient is up 6-8lbs from estimated dry weight and abdomen is distended. Has been taking Lasix 80mg  however feels this is no longer effective. Was started on Torsemide however lost her previous prescription thus has not officially started this medication. Denies any chest pain or cough/fever/chills to indicate ACS or infection. Patient does have history of SVT that per cardiology was felt to be more consistent with unsustained atrial tachycardia. She appears to have some breakthrough activity since her Metoprolol was decreased for soft blood pressures. She was instructed to follow up with Dr. Radford Pax in November 2020. This may be contributing to her symptoms as well. Had very long discussion in regards to this. Appears patient is able to pick up next RX of torsemide. Called in new rx of Torsemide and instructed patient to start taking immediately. If she is unable to obtain rx, then she should increase her Lasix to 160mg  BID. Patient was also instructed to call her cardiologist to schedule an appointment for this week. If unable to get into cardiology this week, she should be seen by our clinic for follow up on her symptoms. Teach back method practiced.  - at follow up and symptoms persist, recommend CBC to evaluate for anemia, CMP to evaluate albumin/protein levels and liver function, and consider repeat echocardiogram as this is concerning for right sided heart failure which would be new for patient.

## 2019-03-30 NOTE — Assessment & Plan Note (Signed)
Pepcid refill request. Rx ordered.

## 2019-04-20 ENCOUNTER — Telehealth: Payer: Self-pay | Admitting: Family Medicine

## 2019-04-20 ENCOUNTER — Other Ambulatory Visit: Payer: Self-pay

## 2019-04-20 DIAGNOSIS — E1165 Type 2 diabetes mellitus with hyperglycemia: Secondary | ICD-10-CM

## 2019-04-20 MED ORDER — INSULIN ASPART 100 UNIT/ML ~~LOC~~ SOLN: 4.0000 [IU] | Freq: Three times a day (TID) | SUBCUTANEOUS | 5 refills | Status: DC

## 2019-04-20 MED ORDER — ATORVASTATIN CALCIUM 80 MG PO TABS: 80.0000 mg | ORAL_TABLET | Freq: Every morning | ORAL | 2 refills | Status: DC

## 2019-04-20 MED ORDER — TRESIBA FLEXTOUCH 100 UNIT/ML ~~LOC~~ SOPN: 40.0000 [IU] | PEN_INJECTOR | Freq: Every day | SUBCUTANEOUS | 2 refills | Status: DC

## 2019-04-20 NOTE — Telephone Encounter (Signed)
Called to check in on patient given endorsement of worsening SOB with exertion during last visit. Appears patient never followed up with provider at [facility] or cardiologist and was unable to pick up new rx of Torsemide until this week. However she notes she has started the Torsemide and is having good response to the medicine - she has already urinated 3 times since taking the medicine a little while ago. She notes her breathing is improving as well.   She also notes she has been taking the new [place] as well. She endorsed some mild abdominal discomfort. She feels this may be due to taking on an empty stomach and will attempt to eat more prior to taking again. She plans to continue to monitor at this time.   Patient is scheduled for follow up visit on 11/13 with me. Will follow up on how she is doing on the new medicine at that time and check in on abdominal discomfort.

## 2019-04-29 ENCOUNTER — Other Ambulatory Visit: Payer: Self-pay

## 2019-04-29 ENCOUNTER — Encounter: Payer: Self-pay | Admitting: Family Medicine

## 2019-04-29 ENCOUNTER — Ambulatory Visit (INDEPENDENT_AMBULATORY_CARE_PROVIDER_SITE_OTHER): Payer: Medicare HMO | Admitting: Family Medicine

## 2019-04-29 VITALS — BP 135/80 | HR 92 | Wt 148.0 lb

## 2019-04-29 DIAGNOSIS — IMO0002 Reserved for concepts with insufficient information to code with codable children: Secondary | ICD-10-CM

## 2019-04-29 DIAGNOSIS — I10 Essential (primary) hypertension: Secondary | ICD-10-CM

## 2019-04-29 DIAGNOSIS — M62831 Muscle spasm of calf: Secondary | ICD-10-CM | POA: Diagnosis not present

## 2019-04-29 DIAGNOSIS — E1140 Type 2 diabetes mellitus with diabetic neuropathy, unspecified: Secondary | ICD-10-CM

## 2019-04-29 DIAGNOSIS — E1165 Type 2 diabetes mellitus with hyperglycemia: Secondary | ICD-10-CM | POA: Diagnosis not present

## 2019-04-29 DIAGNOSIS — Z794 Long term (current) use of insulin: Secondary | ICD-10-CM

## 2019-04-29 MED ORDER — CYCLOBENZAPRINE HCL 10 MG PO TABS: 10.0000 mg | ORAL_TABLET | Freq: Three times a day (TID) | ORAL | 0 refills | Status: AC | PRN

## 2019-04-29 NOTE — Progress Notes (Signed)
Subjective:   Patient ID: [name]    DOB: [birth year], 63 y.o. female   MRN: [id number]  [name] is a 63 y.o. female with a history of HTN, COPD, GERD, T2DM, HLD, peripheral neuropathy, bipolar disorder, depression/anxiety, fibromyalgia here for diabetes follow up.  T2DM: Patient seen 1 month ago from uncontrolled T2DM. Her Hgb A1C went from 13>10.9. She was started on Farxiga 5mg  QD in addition to her chronic diabetes meds Tresiba 35U QD and Novolog sliding scale (4/8 units). She notes she is tolerating the [place] well. She is intolerant to Metformin IR or ER. Her blood sugars range from 52-260. She does have intermittent higher pre-prandial 250-350 that she covers with Novolog but does not check her blood sugars post prandial. She also has some intermittent high levels of 350-450 where she endorses forgetting to take her [place]. Her blood sugar meter indicates lower blood sugars first thing in the morning. Endorses some occasional hypoglycemia symptoms when she is active, particularly if working out in her yard or long trips to the grocery store. Denies any polyuria, polydipsia, polyphagia. She continues to follow up with dietician which she is working on changes in sugary drinks and carbohydrate.  Leg Cramps: Patient endorses chronic intermittent leg cramps. These come and go. She notes she has to get up and walk around to help with the symptoms.  She has treated these cramps in the past with Flexeril as needed.   Hypertension: BP 135/80. Endorses compliance with Candesartan 4mg  QD and Metoprolol 50mg  QD. Denies any chest pain, SOB, vision changes, or headaches.  Review of Systems:  Per HPI.   [place], medications and smoking status reviewed.  Objective:   BP 135/80   Pulse 92   Wt 148 lb (67.1 kg)   SpO2 99%   BMI 27.07 kg/m  Vitals and nursing note reviewed.  General: pleasant older lady, appeared sad at initial visit, well nourished, well developed, in no acute  distress with non-toxic appearance Resp: breathing comfortably on room air, speaking in full sentences Skin: warm, dry EHM:CNOB normal Neuro: Alert and oriented, speech normal  Assessment & Plan:   Uncontrolled type 2 diabetes mellitus with diabetic neuropathy, with long-term current use of insulin (HCC) Chronic. AIC goal 7-8. Tolerating Farxiga well. Blood sugars overall appear improved. Some lows in early morning and when active and appears to have higher sugar levels around meal times. Discussed patient with Dr. Valentina Lucks.  - Opted to decrease Tresiba to 30 units qAM, increase sliding scale Novolog to 6-10 units, and continue [place] as prescribed.  - Recommended patient decrease Novolog sliding scale by 2 units if she plans to be active.  - Patient instructed to check pre-prandial and post-prandial blood sugars and keep a log.  - Continue to work with dietician and life style modifications.  - obtain BMP to evaluate kidney function after starting Farxiga - Follow up in 1 month for repeat A1C. Scheduled for follow up visit on 12/14 with me. - discussed hypoglecemia symptoms and recommended to decrease Tresiba by 2 units if she obtains fasting blood sugars <80 - Patient notes desire for Novolog pens at next refill instead of Novolog vial as it is hard for her to see to draw up accurately  Primary hypertension Slightly above blood pressure goal of <130/80. Endorses compliance. Follows closely with cardiology - BMP today to monitor kidney function - continue current medications   Leg Cramps: Chronic. Will check BMP to ensure no electrolyte imbalances given she  is on Torsemide and SGLT-2. Will give one month supply of Flexeril as she has had good success with this in the past.   Orders Placed This Encounter  Procedures  . Basic Metabolic Panel   Meds ordered this encounter  Medications  . cyclobenzaprine (FLEXERIL) 10 MG tablet    Sig: Take 1 tablet (10 mg total) by mouth 3 (three)  times daily as needed for muscle spasms.    Dispense:  30 tablet    Refill:  0   [name], DO PGY-2, [facility] 04/29/2019 5:20 PM

## 2019-04-29 NOTE — Patient Instructions (Addendum)
Thank you so much for coming in today!  Diabetes Plan:  Take 30 units of Tresiba every day Continue Farxiga 5mg  every day  Adjust your Novolog Sliding Scale to: Blood sugar of:  100-200 take 6 units of Novolog  201-300 take 8 units of Novolog  >300    take 10 units of Novolog  If you plan to be more active, then I recommend you decrease your Novolog by 2 units at that time to help prevent low blood sugars.  Lets plan to see each other again in 1 month for diabetes follow up.  Take care, Dr. Tarry Kos

## 2019-04-29 NOTE — Assessment & Plan Note (Signed)
Slightly above blood pressure goal of <130/80. Endorses compliance. Follows closely with cardiology - BMP today to monitor kidney function - continue current medications

## 2019-04-29 NOTE — Assessment & Plan Note (Addendum)
Chronic. AIC goal 7-8. Tolerating Farxiga well. Blood sugars overall appear improved. Some lows in early morning and when active and appears to have higher sugar levels around meal times. Discussed patient with Dr. Valentina Lucks.  - Opted to decrease Tresiba to 30 units qAM, increase sliding scale Novolog to 6-10 units, and continue [place] as prescribed.  - Recommended patient decrease Novolog sliding scale by 2 units if she plans to be active.  - Patient instructed to check pre-prandial and post-prandial blood sugars and keep a log.  - Continue to work with dietician and life style modifications.  - obtain BMP to evaluate kidney function after starting Farxiga - Follow up in 1 month for repeat A1C. Scheduled for follow up visit on 12/14 with me. - discussed hypoglecemia symptoms and recommended to decrease Tresiba by 2 units if she obtains fasting blood sugars <80 - Patient notes desire for Novolog pens at next refill instead of Novolog vial as it is hard for her to see to draw up accurately

## 2019-04-30 LAB — BASIC METABOLIC PANEL
BUN/Creatinine Ratio: 19 (ref 12–28)
BUN: 23 mg/dL (ref 8–27)
CO2: 30 mmol/L — ABNORMAL HIGH (ref 20–29)
Calcium: 10.5 mg/dL — ABNORMAL HIGH (ref 8.7–10.3)
Chloride: 99 mmol/L (ref 96–106)
Creatinine, Ser: 1.23 mg/dL — ABNORMAL HIGH (ref 0.57–1.00)
GFR calc Af Amer: 54 mL/min/{1.73_m2} — ABNORMAL LOW (ref >59)
GFR calc non Af Amer: 47 mL/min/{1.73_m2} — ABNORMAL LOW (ref >59)
Glucose: 246 mg/dL — ABNORMAL HIGH (ref 65–99)
Potassium: 4.4 mmol/L (ref 3.5–5.2)
Sodium: 143 mmol/L (ref 134–144)

## 2019-05-02 ENCOUNTER — Encounter: Payer: Self-pay | Admitting: Cardiology

## 2019-05-03 ENCOUNTER — Ambulatory Visit: Payer: Medicare HMO | Admitting: Cardiology

## 2019-05-04 ENCOUNTER — Telehealth: Payer: Self-pay | Admitting: Family Medicine

## 2019-05-04 NOTE — Telephone Encounter (Signed)
Attempted to call patient to discuss most recent BMP. Creatinine elevated from baseline 1.00 to 1.23 after recent addition of both Torsemide BID and Farxiga. Unable to reach patient. Will attempt to contact her again. If patient calls back, would like to discuss cutting back on Torsemide from BID to QD. She will need to keep a close eye on her fluid status by weighing her self daily and watching for increase in swelling. If she develops swelling or weight gain, she should go back up on her Torsemide to BID. I will plan to recheck her kidney function at her follow up visit in December. If stable we will plan to continue her current medicines. If worsening then may need to transition from an SGLT-2 to an alternative diabetes medicine such as GLP-1 or DDP4.    Patient also has elevated calcium level. Unclear why given she is on a loop diuretic. Will continue to monitor this at this time.

## 2019-05-06 ENCOUNTER — Telehealth: Payer: Self-pay | Admitting: Family Medicine

## 2019-05-06 NOTE — Telephone Encounter (Signed)
Finally was able to get a hold of patient. Discussed BMP and recommendation as addressed in previous phone message:  - Take Torsemide QD. Increase to BID if increase in fluid status such as swelling, SOB, or weight gain.  - Plan to recheck kidney function at follow up visit.  - If creatinine remains elevated will likely need to transition SGLT-2 to another diabetic medicine such as GLP-1 or DDP4.   Patient understood and agreed to plan.

## 2019-05-17 DIAGNOSIS — R69 Illness, unspecified: Secondary | ICD-10-CM | POA: Diagnosis not present

## 2019-05-30 ENCOUNTER — Ambulatory Visit: Payer: Medicare HMO | Admitting: Family Medicine

## 2019-06-24 ENCOUNTER — Telehealth (HOSPITAL_COMMUNITY): Payer: Self-pay | Admitting: Vascular Surgery

## 2019-06-24 NOTE — Telephone Encounter (Signed)
left pt message to make new chf appt

## 2019-07-06 ENCOUNTER — Telehealth (INDEPENDENT_AMBULATORY_CARE_PROVIDER_SITE_OTHER): Payer: Medicare HMO | Admitting: Family Medicine

## 2019-07-06 ENCOUNTER — Telehealth: Payer: Self-pay

## 2019-07-06 ENCOUNTER — Other Ambulatory Visit: Payer: Self-pay

## 2019-07-06 DIAGNOSIS — I509 Heart failure, unspecified: Secondary | ICD-10-CM | POA: Diagnosis not present

## 2019-07-06 DIAGNOSIS — E1140 Type 2 diabetes mellitus with diabetic neuropathy, unspecified: Secondary | ICD-10-CM

## 2019-07-06 DIAGNOSIS — Z794 Long term (current) use of insulin: Secondary | ICD-10-CM

## 2019-07-06 DIAGNOSIS — IMO0002 Reserved for concepts with insufficient information to code with codable children: Secondary | ICD-10-CM

## 2019-07-06 DIAGNOSIS — J439 Emphysema, unspecified: Secondary | ICD-10-CM | POA: Diagnosis not present

## 2019-07-06 DIAGNOSIS — R69 Illness, unspecified: Secondary | ICD-10-CM | POA: Diagnosis not present

## 2019-07-06 DIAGNOSIS — R0602 Shortness of breath: Secondary | ICD-10-CM | POA: Diagnosis not present

## 2019-07-06 DIAGNOSIS — Z20822 Contact with and (suspected) exposure to covid-19: Secondary | ICD-10-CM | POA: Diagnosis not present

## 2019-07-06 DIAGNOSIS — E1165 Type 2 diabetes mellitus with hyperglycemia: Secondary | ICD-10-CM

## 2019-07-06 DIAGNOSIS — E1122 Type 2 diabetes mellitus with diabetic chronic kidney disease: Secondary | ICD-10-CM | POA: Diagnosis not present

## 2019-07-06 DIAGNOSIS — R42 Dizziness and giddiness: Secondary | ICD-10-CM | POA: Diagnosis not present

## 2019-07-06 DIAGNOSIS — R0902 Hypoxemia: Secondary | ICD-10-CM | POA: Diagnosis not present

## 2019-07-06 DIAGNOSIS — N3000 Acute cystitis without hematuria: Secondary | ICD-10-CM | POA: Diagnosis not present

## 2019-07-06 DIAGNOSIS — N184 Chronic kidney disease, stage 4 (severe): Secondary | ICD-10-CM | POA: Diagnosis not present

## 2019-07-06 DIAGNOSIS — I130 Hypertensive heart and chronic kidney disease with heart failure and stage 1 through stage 4 chronic kidney disease, or unspecified chronic kidney disease: Secondary | ICD-10-CM | POA: Diagnosis not present

## 2019-07-06 DIAGNOSIS — R791 Abnormal coagulation profile: Secondary | ICD-10-CM | POA: Diagnosis not present

## 2019-07-06 DIAGNOSIS — I428 Other cardiomyopathies: Secondary | ICD-10-CM | POA: Diagnosis not present

## 2019-07-06 DIAGNOSIS — N179 Acute kidney failure, unspecified: Secondary | ICD-10-CM | POA: Diagnosis not present

## 2019-07-06 NOTE — Telephone Encounter (Signed)
Pt calls nursing line regarding SHOB on exertion. Pt reports feeling dizzy and feeling like she is going to pass out. Pt states oxygen saturations drop down to 71% with exertion. Right now patient is lying down and has been resting for last 30 minutes and O2 saturation is 98%. Pt denies chest pain at this time.   Pt is having to take breaks on the phone to take breaths. Breathing seems somewhat staggered. Advised patient to be seen and evaluated in ED.   Pt states husband will take her at this time.   Talbot Grumbling, RN

## 2019-07-06 NOTE — Progress Notes (Signed)
[facility] Visit  Patient consented to have virtual visit. Method of visit: Video was attempted, but technology challenges prevented patient from using video, so visit was conducted via telephone.  Encounter participants: Patient: [name] - located at [employer] Provider: [name] - located at [facility] Others (if applicable): None  Chief Complaint: Diabetes follow up  HPI: Type 2 Diabetes: AIC goal 7-8. Currently on Farxiga 10mg  QD, Tresiba 30U qAM, and sliding scale Novolog6-10U. Endorses compliance. She doesn't have her meter with her today. She is going off of memory which she notes is not good. Notes CBGs range 80-180, few in the 230's and few 400's. Few low blood sugars 40-50's but she cant remember part of day - she feels weak and lightheaded when it gets this low. Denies any polyuria, polydipsia, polyphagia.   ROS: per HPI  Pertinent PMHx: uncontrolled type 2 diabetes  Exam:  Respiratory: speaking in full sentences, unlabored breathing appreciated   Assessment/Plan:  Uncontrolled type 2 diabetes mellitus with diabetic neuropathy, with long-term current use of insulin ([name]) Unable to hold in-person visit due to patient presenting to clinic with SOB. Visit converted to telemedicine visit. Patient also did not have meter or record of her blood sugars. Appears on average patient has better control with occasional high and occasional low. Unclear when these are happening thus do not feel comfortable making insulin adjustments. Will continue current regimen and follow up in 1 month. Patient informed to keep log of blood sugars and be sure to bring these with her.  Creatinine elevated on prior BMP. Was planning on rechecking today given transition to virtual, this was unable to be performed.  - continue Farxiga 10mg  QD, Tresiba 30U qAM, and sliding scale Novolog 6-10U.  - Plan to recheck kidney function at follow up visit.  - If creatinine  remains elevated will likely need to transition SGLT-2 to another diabetic medicine such as GLP-1 or DDP4.     Time spent during visit with patient: 21 minutes  [name], [place], PGY2 07/06/19

## 2019-07-07 DIAGNOSIS — R42 Dizziness and giddiness: Secondary | ICD-10-CM | POA: Diagnosis not present

## 2019-07-07 DIAGNOSIS — R0902 Hypoxemia: Secondary | ICD-10-CM | POA: Diagnosis not present

## 2019-07-07 DIAGNOSIS — I340 Nonrheumatic mitral (valve) insufficiency: Secondary | ICD-10-CM | POA: Diagnosis not present

## 2019-07-07 DIAGNOSIS — I517 Cardiomegaly: Secondary | ICD-10-CM | POA: Diagnosis not present

## 2019-07-07 DIAGNOSIS — N179 Acute kidney failure, unspecified: Secondary | ICD-10-CM | POA: Diagnosis not present

## 2019-07-07 DIAGNOSIS — R791 Abnormal coagulation profile: Secondary | ICD-10-CM | POA: Diagnosis not present

## 2019-07-07 DIAGNOSIS — I5189 Other ill-defined heart diseases: Secondary | ICD-10-CM | POA: Diagnosis not present

## 2019-07-07 NOTE — Assessment & Plan Note (Signed)
Unable to hold in-person visit due to patient presenting to clinic with SOB. Visit converted to telemedicine visit. Patient also did not have meter or record of her blood sugars. Appears on average patient has better control with occasional high and occasional low. Unclear when these are happening thus do not feel comfortable making insulin adjustments. Will continue current regimen and follow up in 1 month. Patient informed to keep log of blood sugars and be sure to bring these with her.  Creatinine elevated on prior BMP. Was planning on rechecking today given transition to virtual, this was unable to be performed.  - continue Farxiga 10mg  QD, Tresiba 30U qAM, and sliding scale Novolog 6-10U.  - Plan to recheck kidney function at follow up visit.  - If creatinine remains elevated will likely need to transition SGLT-2 to another diabetic medicine such as GLP-1 or DDP4.

## 2019-07-08 DIAGNOSIS — N3000 Acute cystitis without hematuria: Secondary | ICD-10-CM | POA: Diagnosis not present

## 2019-07-08 DIAGNOSIS — I10 Essential (primary) hypertension: Secondary | ICD-10-CM | POA: Diagnosis not present

## 2019-07-08 DIAGNOSIS — R69 Illness, unspecified: Secondary | ICD-10-CM | POA: Diagnosis not present

## 2019-07-08 DIAGNOSIS — R42 Dizziness and giddiness: Secondary | ICD-10-CM | POA: Diagnosis not present

## 2019-07-08 DIAGNOSIS — R0902 Hypoxemia: Secondary | ICD-10-CM | POA: Diagnosis not present

## 2019-07-08 DIAGNOSIS — N179 Acute kidney failure, unspecified: Secondary | ICD-10-CM | POA: Diagnosis not present

## 2019-07-08 DIAGNOSIS — J449 Chronic obstructive pulmonary disease, unspecified: Secondary | ICD-10-CM | POA: Diagnosis not present

## 2019-07-09 DIAGNOSIS — I10 Essential (primary) hypertension: Secondary | ICD-10-CM | POA: Diagnosis not present

## 2019-07-09 DIAGNOSIS — R42 Dizziness and giddiness: Secondary | ICD-10-CM | POA: Diagnosis not present

## 2019-07-09 DIAGNOSIS — N3000 Acute cystitis without hematuria: Secondary | ICD-10-CM | POA: Diagnosis not present

## 2019-07-09 DIAGNOSIS — N179 Acute kidney failure, unspecified: Secondary | ICD-10-CM | POA: Diagnosis not present

## 2019-07-09 DIAGNOSIS — R0902 Hypoxemia: Secondary | ICD-10-CM | POA: Diagnosis not present

## 2019-07-09 DIAGNOSIS — R69 Illness, unspecified: Secondary | ICD-10-CM | POA: Diagnosis not present

## 2019-07-09 DIAGNOSIS — J449 Chronic obstructive pulmonary disease, unspecified: Secondary | ICD-10-CM | POA: Diagnosis not present

## 2019-07-27 ENCOUNTER — Telehealth (HOSPITAL_COMMUNITY): Payer: Self-pay | Admitting: Vascular Surgery

## 2019-07-27 NOTE — Telephone Encounter (Signed)
3rd attempt, left pt message to make new chf appt

## 2019-08-03 DIAGNOSIS — I10 Essential (primary) hypertension: Secondary | ICD-10-CM | POA: Diagnosis not present

## 2019-08-03 DIAGNOSIS — I428 Other cardiomyopathies: Secondary | ICD-10-CM | POA: Diagnosis not present

## 2019-08-08 ENCOUNTER — Encounter: Payer: Self-pay | Admitting: Family Medicine

## 2019-08-08 ENCOUNTER — Other Ambulatory Visit: Payer: Self-pay

## 2019-08-08 ENCOUNTER — Ambulatory Visit (INDEPENDENT_AMBULATORY_CARE_PROVIDER_SITE_OTHER): Payer: Medicare HMO | Admitting: Family Medicine

## 2019-08-08 VITALS — BP 128/84 | HR 94 | Wt 140.2 lb

## 2019-08-08 DIAGNOSIS — IMO0002 Reserved for concepts with insufficient information to code with codable children: Secondary | ICD-10-CM

## 2019-08-08 DIAGNOSIS — F17200 Nicotine dependence, unspecified, uncomplicated: Secondary | ICD-10-CM

## 2019-08-08 DIAGNOSIS — E785 Hyperlipidemia, unspecified: Secondary | ICD-10-CM

## 2019-08-08 DIAGNOSIS — E1140 Type 2 diabetes mellitus with diabetic neuropathy, unspecified: Secondary | ICD-10-CM

## 2019-08-08 DIAGNOSIS — Z794 Long term (current) use of insulin: Secondary | ICD-10-CM | POA: Diagnosis not present

## 2019-08-08 DIAGNOSIS — E1165 Type 2 diabetes mellitus with hyperglycemia: Secondary | ICD-10-CM | POA: Diagnosis not present

## 2019-08-08 DIAGNOSIS — I10 Essential (primary) hypertension: Secondary | ICD-10-CM | POA: Diagnosis not present

## 2019-08-08 DIAGNOSIS — K089 Disorder of teeth and supporting structures, unspecified: Secondary | ICD-10-CM

## 2019-08-08 DIAGNOSIS — R69 Illness, unspecified: Secondary | ICD-10-CM | POA: Diagnosis not present

## 2019-08-08 DIAGNOSIS — I5042 Chronic combined systolic (congestive) and diastolic (congestive) heart failure: Secondary | ICD-10-CM

## 2019-08-08 DIAGNOSIS — E1169 Type 2 diabetes mellitus with other specified complication: Secondary | ICD-10-CM | POA: Diagnosis not present

## 2019-08-08 LAB — POCT GLYCOSYLATED HEMOGLOBIN (HGB A1C): HbA1c, POC (controlled diabetic range): 10.1 % — AB (ref 0.0–7.0)

## 2019-08-08 MED ORDER — PRO-STAT SUGAR FREE PO LIQD: 30.0000 mL | Freq: Three times a day (TID) | ORAL | 11 refills | Status: AC

## 2019-08-08 MED ORDER — DAPAGLIFLOZIN PROPANEDIOL 10 MG PO TABS: 10.0000 mg | ORAL_TABLET | Freq: Every day | ORAL | 3 refills | Status: AC

## 2019-08-08 NOTE — Progress Notes (Signed)
Subjective:   Patient ID: [name]    DOB: 10-12-1955, 64 y.o. female   MRN: [id number]  [name] is a 64 y.o. female with a history of CHF with nonischemic cardiomyopathy, hypertension, COPD, HDL, GERD, neuropathy, uncontrolled diabetes, fibromyalgia, tobacco use disorder here for diabetes follow up.  Uncontrolled Type 2 Diabetes: Last three A1C's below. Currently on Dapagliflozin 5 mg daily, NovoLog 6-10U with meals, Tresiba 30U QD. Endorses compliance. Notes CBGs range 57-380 with average BS in 250-280's. Denies any polyuria, polydipsia, polyphagia. Due for diabetic eye exam. Notes she doesn't eat very much food due to not having any upper teeth - this leads to her drinking lots of fluids such as tea, coffee. She is supposed to drink ensures but can't afford it.   Lab Results  Component Value Date   HGBA1C 10.1 (A) 08/08/2019   HGBA1C 10.9 (A) 02/28/2019   HGBA1C 13.7 (A) 10/21/2018    HTN:  BP: 128/84 today. Currently on candesartan 4 mg daily. Endorses compliance. Denies any chest pain, SOB, vision changes, or headaches.   HLD: Last lipid panel below. Currently on atorvastatin 80 mg daily. Endorses compliance. Denies any muscles aches or weakness. Lab Results  Component Value Date   CHOL 188 09/17/2018   HDL 65 09/17/2018   LDLCALC 93 09/17/2018   LDLDIRECT 237 (H) 06/25/2017   TRIG 148 09/17/2018   CHOLHDL 2.9 09/17/2018   HFrEF (EF 45%)  Nonischemic Cardiomyopathy Currently on Carvedilol 12.5mg  BID, Candesartan 25mg , and Torsemide 20mg  QD with extra pill if wt >3lbs. Patient to limit fluid 45-50oz/day and low sodium diet of <2g/day. Considering Entresto in the future. Notes occasional swelling and improvement in breathing but this comes and goes.   EDW 145lbs, Wt today: 140lbs.   History of Tobacco use: Smoked 0.5 pack/day x 45 years.(23 pack year history). Quit 1 year ago.   Review of Systems:  Per HPI.   [place], medications and smoking status  reviewed.  Objective:   BP 128/84   Pulse 94   Wt 140 lb 3.2 oz (63.6 kg)   SpO2 99%   BMI 25.64 kg/m  Vitals and nursing note reviewed.  General: pleasant thinner older female, well nourished, well developed, in no acute distress with non-toxic appearance CV: regular rate and rhythm, gallop appreciated best at left upper sternal border, no lower extremity edema, 2+ radial and pedal pulses bilaterally Lungs: clear to auscultation bilaterally with normal work of breathing on room air Skin: warm, dry, small hyperpigmented patch on left lateral mid foot, tender to palpation with harder bony nodule appreciated that is tender to palpation, no signs of infection including no erythema, edema, or discharge Extremities: warm and well perfused MSK: gait normal Neuro: Alert and oriented, speech normal  Assessment & Plan:   Tobacco use disorder Quit smoking 1 year ago. Congratulated patient on this success!. History of smoking 1/2-3/4 pack per day x ~45 years. This makes her have a 23-31 pack year. Last low dose CT screen 2018 notable for emphysema and aortic arthrosclerosis.  Will discuss repeat low dose CT lung cancer screen at follow up visit.   Uncontrolled type 2 diabetes mellitus with diabetic neuropathy, with long-term current use of insulin (HCC) A1c improving.  Not eating many meals, does not using NovoLog very often.  Discussed caloric intake which includes sugary drinks such as tea and coffee.  Patient has difficulty eating solids due to her poor dentition and cannot afford the Ensure supplements.  Discussed prostat  meal replacement which is sugar-free.  Staff message sent to [name] to inquire about financial assistance for the meal replacements.  Recommended patient monitor blood sugars more closely and supplement with NovoLog when elevated, particularly when consuming a calorically dense beverage.  Will increase to Dapagliflozen to 10 mg daily, decrease NovoLog to 4 to 6 units  with meals, and increase Tresiba to 35U daily.  Patient instructed to decrease his Tyler Aas back to 30 units if she persistently has blood sugars less than 100. -Increase Dapagliflozen to 10 mg daily -Decrease NovoLog to 4 to 6 units with meals -Increase Tresiba to 35 units daily -Monitor blood sugars 3 times a day with meals, fasting and before bed -Decrease Tresiba back to 30 units daily if consistently gets blood sugar <100 -Recommended to schedule diabetic eye exam at earliest convenience -Follow-up 1 month for close monitoring -Referral for chronic care management given multiple chronic comorbid conditions -Referral placed for diabetic education and nutrition  Primary hypertension Normotensive and well controlled. Continue candesartan 25 mg daily  Hyperlipidemia due to type 2 diabetes mellitus (HCC) Continue atorvastatin 80 mg daily  Chronic combined systolic and diastolic CHF (congestive heart failure) (HCC) Newer diagnosis with EF of 45 to 50%.  Coronary cath without obstructive disease.  Follows closely with cardiology.  Currently on carvedilol, candesartan, and torsemide.  EDW 145 LBS, weight today 140 LBS.  Euvolemic on exam and breathing comfortably on room air. -Continue current medications -Follow-up with cardiology as scheduled  Orders Placed This Encounter  Procedures  . Ambulatory referral to Nutrition and Diabetic Education    Referral Priority:   Routine    Referral Type:   Consultation    Referral Reason:   Specialty Services Required    Number of Visits Requested:   1  . Ambulatory referral to Chronic Care Management Services    Referral Priority:   Routine    Referral Type:   Consultation    Referral Reason:   Care Coordination    Number of Visits Requested:   1  . HgB A1c   Meds ordered this encounter  Medications  . Amino Acids-Protein Hydrolys (FEEDING SUPPLEMENT, PRO-STAT SUGAR FREE 64,) LIQD    Sig: Take 30 mLs by mouth 3 (three) times daily with  meals.    Dispense:  887 mL    Refill:  11  . dapagliflozin propanediol (FARXIGA) 10 MG TABS tablet    Sig: Take 10 mg by mouth daily.    Dispense:  90 tablet    Refill:  [place], DO PGY-2, [facility] 08/08/2019 9:46 PM

## 2019-08-08 NOTE — Assessment & Plan Note (Signed)
Quit smoking 1 year ago. Congratulated patient on this success!. History of smoking 1/2-3/4 pack per day x ~45 years. This makes her have a 23-31 pack year. Last low dose CT screen 2018 notable for emphysema and aortic arthrosclerosis.  Will discuss repeat low dose CT lung cancer screen at follow up visit.

## 2019-08-08 NOTE — Assessment & Plan Note (Signed)
Normotensive and well controlled. Continue candesartan 25 mg daily

## 2019-08-08 NOTE — Assessment & Plan Note (Addendum)
A1c improving.  Not eating many meals, does not using NovoLog very often.  Discussed caloric intake which includes sugary drinks such as tea and coffee.  Patient has difficulty eating solids due to her poor dentition and cannot afford the Ensure supplements.  Discussed prostat meal replacement which is sugar-free.  Staff message sent to [name] to inquire about financial assistance for the meal replacements.  Recommended patient monitor blood sugars more closely and supplement with NovoLog when elevated, particularly when consuming a calorically dense beverage.  Will increase to Dapagliflozen to 10 mg daily, decrease NovoLog to 4 to 6 units with meals, and increase Tresiba to 35U daily.  Patient instructed to decrease his Tyler Aas back to 30 units if she persistently has blood sugars less than 100. -Increase Dapagliflozen to 10 mg daily -Decrease NovoLog to 4 to 6 units with meals -Increase Tresiba to 35 units daily -Monitor blood sugars 3 times a day with meals, fasting and before bed -Decrease Tresiba back to 30 units daily if consistently gets blood sugar <100 -Recommended to schedule diabetic eye exam at earliest convenience -Follow-up 1 month for close monitoring -Referral for chronic care management given multiple chronic comorbid conditions -Referral placed for diabetic education and nutrition

## 2019-08-08 NOTE — Assessment & Plan Note (Signed)
Newer diagnosis with EF of 45 to 50%.  Coronary cath without obstructive disease.  Follows closely with cardiology.  Currently on carvedilol, candesartan, and torsemide.  EDW 145 LBS, weight today 140 LBS.  Euvolemic on exam and breathing comfortably on room air. -Continue current medications -Follow-up with cardiology as scheduled

## 2019-08-08 NOTE — Patient Instructions (Signed)
Thank you for coming to see me today. It was a pleasure to see you.   Increase your Wilder Glade to 10mg  a day. Increase Tresiba to 35 units a day. If you consistently get numbers <100 then decrease to 30 units a day. Continue Novolog 4-6 units with meals.   Please follow-up with 1 month for diabetes follow up.   If you have any questions or concerns, please do not hesitate to call the office at [phone].  Take Care,   Dr. [name], DO Resident Physician [place] [phone]

## 2019-08-08 NOTE — Assessment & Plan Note (Signed)
Continue atorvastatin 80 mg daily. 

## 2019-08-09 ENCOUNTER — Telehealth: Payer: Self-pay | Admitting: Family Medicine

## 2019-08-09 NOTE — Chronic Care Management (AMB) (Signed)
  Chronic Care Management   Outreach Note  08/09/2019 Name: [name] MRN: [id number] DOB: 04/03/1956  [name] is a 64 y.o. year old female who is a primary care patient of [name], DO. I reached out to [employer] by phone today in response to a referral sent by [name] PCP, Dr. [name]     An unsuccessful telephone outreach was attempted today. The patient was referred to the case management team for assistance with care management and care coordination.   Follow Up Plan: A HIPPA compliant phone message was left for the patient providing contact information and requesting a return call.  The care management team will reach out to the patient again over the next 7 days.  If patient returns call to provider office, please advise to call Embedded Care Management Care Guide Glenna Durand LPN at [phone]  Renika Shiflet, LPN Health Advisor, [place] Management ??[email or web address] ??[phone]

## 2019-08-12 ENCOUNTER — Ambulatory Visit: Payer: Self-pay | Admitting: Licensed Clinical Social Worker

## 2019-08-12 NOTE — Chronic Care Management (AMB) (Signed)
   Social Work  Care Management Consultation  08/12/2019 Name: [name] MRN: [id number] DOB: 09-13-1955  [name] is a 64 y.o. year old female who sees [place], Archie Endo, DO for primary care. LCSW was consulted by PCP for information /resources to assistance patient with obtaining food supplements ( Pro-Stat). Patient was not interviewed or contacted during this encounter however LCSW researched options for the above request.     Recommendation: Patient may benefit from receiving ensure sample in the [facility] office as well as completing financial assistance application for Ensure produces as pro-stat does not offer financial assistance to off set the cost .    Intervention: LCSW researched options. Relevant information and resources shared with provider.   Review of patient status, including review of consultants reports, relevant laboratory and other test results, and collaboration with appropriate care team members and the patient's provider was performed as part of comprehensive patient evaluation and provision of chronic care management services.    Plan:  1. Provider will review samples in clinic to see if they will meet patient's need 2. Communicate this to LCSW 3. LCSW will follow up with patient to assist with Ensure financial assistance application after provider communicates next steps to LCSW.  [name], LCSW Clinical Social Worker [place] / [place]   [phone] 8:58 AM

## 2019-08-16 ENCOUNTER — Ambulatory Visit: Payer: Self-pay | Admitting: Licensed Clinical Social Worker

## 2019-08-16 NOTE — Chronic Care Management (AMB) (Signed)
    Clinical Social Work  Care Management Outreach   08/16/2019 Name: ETHELLE OLA MRN: [id number] DOB: 1956/04/07  LUDA CHARBONNEAU is a 64 y.o. year old female who is a primary care patient of [name], DO .   The Care Management team was consulted for assistance with nutrition supplements LCSW reached out to [employer] today by phone to introduce self, assess needs and offer Care Management services and interventions.     Review of patient status, including review of consultants reports, relevant laboratory and other test results, and collaboration with appropriate care team members and the patient's provider was performed as part of comprehensive patient evaluation and provision of care management services.    Follow Up Plan: Phone appointment scheduled Friday March 5th   [name], Ali Molina Worker [place] / [place]   [phone] 3:41 PM

## 2019-08-18 ENCOUNTER — Other Ambulatory Visit: Payer: Self-pay

## 2019-08-18 ENCOUNTER — Ambulatory Visit (INDEPENDENT_AMBULATORY_CARE_PROVIDER_SITE_OTHER): Payer: Medicare HMO | Admitting: Family Medicine

## 2019-08-18 VITALS — BP 130/61 | HR 85 | Temp 97.7°F | Wt 132.0 lb

## 2019-08-18 DIAGNOSIS — H00025 Hordeolum internum left lower eyelid: Secondary | ICD-10-CM

## 2019-08-18 NOTE — Progress Notes (Signed)
    SUBJECTIVE:   CHIEF COMPLAINT / HPI:   Patient complaining of approximately 24 hours of painful lesion on left lower eyelid.  She says she can "feel a bump ", she denies any trauma or any sensation of "something in her eye ".  She says she notices first in the shower and she just thought that her eyelid was hurting as she thought she could only may be see a little bit of a deformation, then when she pulled her eyelid down and looked inside she thought that there was a much more visible bump.  She has no changes acutely in her vision and her vision is symmetric per her although she has had slowly decreasing vision over the last couple years and coincidentally happens to have a follow-up with an ophthalmologist on Monday.  PERTINENT  PMH / PSH: See HPI  OBJECTIVE:   BP 130/61   Pulse 85   Temp 97.7 F (36.5 C) (Oral)   Wt 132 lb (59.9 kg)   SpO2 97%   BMI 24.14 kg/m   General: Alert, pleasant, no distress Eye exam: Vision is 20/70 bilaterally, no focal field deficits per patient, left eyelid with minimally visible focal edema central to left lower eyelid, sclera is clear, when retracting left lower eyelid there is a very obviously visible approximately 1 cm in diameter  ASSESSMENT/PLAN:   Hordeolum internum left lower eyelid Likely internal hordeolum of left lower eyelid, no acute changes in vision, does have chronic visual decline for which she happens to have a follow-up appointment with Dr. Katy Fitch on Monday.  Advised warm compresses until that time, immediate contact with physician if any acute change in vision happens.     Sherene Sires, [place]

## 2019-08-18 NOTE — Patient Instructions (Signed)
After reviewing your story and doing exam, we think that this might actually be an in internal stye.  This would be a slightly less common presentation but the treatment for this would still be warm compresses, avoiding make-up or chemicals around the eye and occasional gentle massage.  I am glad you have a follow-up on Monday with your eye doctor, please make this part of your discussion with them.  Sometimes these take 2 to 3 weeks to go away.  If you get any rapid changes in your vision, please get to the emergency department immediately but I would find that to be very unlikely.  Dr. Criss Rosales

## 2019-08-19 ENCOUNTER — Ambulatory Visit: Payer: Medicare HMO | Admitting: Licensed Clinical Social Worker

## 2019-08-19 DIAGNOSIS — E440 Moderate protein-calorie malnutrition: Secondary | ICD-10-CM

## 2019-08-19 DIAGNOSIS — Z7189 Other specified counseling: Secondary | ICD-10-CM

## 2019-08-19 DIAGNOSIS — IMO0002 Reserved for concepts with insufficient information to code with codable children: Secondary | ICD-10-CM

## 2019-08-19 DIAGNOSIS — E1140 Type 2 diabetes mellitus with diabetic neuropathy, unspecified: Secondary | ICD-10-CM

## 2019-08-19 NOTE — Assessment & Plan Note (Signed)
Likely internal hordeolum of left lower eyelid, no acute changes in vision, does have chronic visual decline for which she happens to have a follow-up appointment with Dr. Katy Fitch on Monday.  Advised warm compresses until that time, immediate contact with physician if any acute change in vision happens.

## 2019-08-19 NOTE — Chronic Care Management (AMB) (Signed)
Care Management   Clinical Social Work initial Note  08/19/2019 Name: [name] MRN: [id number] DOB: [birth year]  The Care Management team was consulted by PCP to assist the patient with obtaining food/nutrition supplements   [name] is a 64 y.o. year old female who sees [place], Archie Endo, DO for primary care. LCSW reached out to [employer] today by phone to introduce self, assess needs and barriers to care.   Assessment: Patient is pleasanat and engaged in conversation.  Experiencing difficulty eating and needs food/nutrition supplements.  Patient is on a fixed income and unable to purchase these.      Review of patient status, including review of consultants reports, relevant laboratory and other test results, and collaboration with appropriate care team members and the patient's provider was performed as part of comprehensive patient evaluation and provision of chronic care management services.    SDOH (Social Determinants of Health) assessments performed: No:   Goals Addressed            This Visit's Progress   . Nutrition Supplements       CARE PLAN ENTRY (see longtitudinal plan of care for additional care plan information)  Current Barriers:  . Patient with COPD and DMII needs food/nutrition supplements due to not being able to eat solid food,  . Acknowledges deficits and needs support, education and care coordination in order to meet this unmet need  . Unable to afford Ensure Clinical Goal(s):  Over the next 30 days, patient will receive samples and assistance with food supplements and be able to manager her health  Interventions: . Assessed patient needs, what she has done in the past and how she is currently meeting need . Assisted patient with completing application for nutrition assistance, discussed samples . Collaborated with PCP regarding patient needs and request to sign [employer] patient assistance  forms  . Collaborated with appropriate  clinical care team members regarding patient needs Patient Self Care Activities: . Patient is unable to independently navigate getting food/nutrition supplements without care coordination support  Initial goal documentation        Outpatient Encounter Medications as of 08/19/2019  Medication Sig  . acetaminophen (TYLENOL) 500 MG tablet Take 1,000 mg by mouth every 6 (six) hours as needed for mild pain.  . Amino Acids-Protein Hydrolys (FEEDING SUPPLEMENT, PRO-STAT SUGAR FREE 64,) LIQD Take 30 mLs by mouth 3 (three) times daily with meals.  [name] atorvastatin (LIPITOR) 80 MG tablet Take 1 tablet (80 mg total) by mouth every morning.  . Blood Glucose Monitoring Suppl (ONE TOUCH ULTRA 2) W/DEVICE KIT 1 kit by Does not apply route once.  . candesartan (ATACAND) 4 MG tablet Take 1 tablet by mouth once daily  . cyclobenzaprine (FLEXERIL) 10 MG tablet Take 1 tablet (10 mg total) by mouth 3 (three) times daily as needed for muscle spasms.  . dapagliflozin propanediol (FARXIGA) 10 MG TABS tablet Take 10 mg by mouth daily.  . famotidine (PEPCID) 40 MG tablet Take 1 tablet (40 mg total) by mouth daily.  . feeding supplement, ENSURE ENLIVE, (ENSURE ENLIVE) LIQD Take 237 mLs by mouth 3 (three) times daily between meals.  . gabapentin (NEURONTIN) 100 MG capsule Take 1 capsule (100 mg total) by mouth 3 (three) times daily.  [name] glucose blood (ONE TOUCH ULTRA TEST) test strip USE 1 STRIP TO CHECK GLUCOSE THREE TIMES DAILY  . insulin aspart (NOVOLOG) 100 UNIT/ML injection Inject 4-8 Units into the skin 3 (three) times daily  with meals.  . insulin degludec (TRESIBA FLEXTOUCH) 100 UNIT/ML SOPN FlexTouch Pen Inject 0.4 mLs (40 Units total) into the skin daily.  . Insulin Pen Needle 33G X 4 MM MISC 1 Units by Does not apply route 5 (five) times daily.  . Lancets (ONETOUCH ULTRASOFT) lancets Use as instructed  . metoprolol succinate (TOPROL-XL) 50 MG 24 hr tablet Take 50 mg by mouth daily. Take with or immediately following  a meal.  . mirtazapine (REMERON) 15 MG tablet Take 1 tablet (15 mg total) by mouth at bedtime.  . ONE TOUCH LANCETS MISC 1 Device by Does not apply route 3 (three) times daily.  [name] torsemide (DEMADEX) 20 MG tablet Take 1 tablet (20 mg total) by mouth 2 (two) times daily.   No facility-administered encounter medications on file as of 08/19/2019.       Information about Care Management services will be shared with Ms.  Kray  During office visit next week including:  1. Care Management services include personalized support from designated clinical staff supervised by her physician, including individualized plan of care and coordination with other care providers 2. Remind patient of 24/7 contact phone numbers to provider's office for assistance with urgent and routine care needs. 3. Care Management services are voluntary and patient may stop at any time .  Patient agreed to services provided today and verbal consent obtained.   Follow Up Plan: Patient will come into office 08/23/19 to pick up ensure samples as well as sign [employer] food/nutrition supplement application.   [name], LCSW Clinical Social Worker [place] / [place]   [phone] 11:51 AM

## 2019-08-22 DIAGNOSIS — H25812 Combined forms of age-related cataract, left eye: Secondary | ICD-10-CM | POA: Diagnosis not present

## 2019-08-22 DIAGNOSIS — H16223 Keratoconjunctivitis sicca, not specified as Sjogren's, bilateral: Secondary | ICD-10-CM | POA: Diagnosis not present

## 2019-08-22 DIAGNOSIS — E113553 Type 2 diabetes mellitus with stable proliferative diabetic retinopathy, bilateral: Secondary | ICD-10-CM | POA: Diagnosis not present

## 2019-08-22 DIAGNOSIS — Z961 Presence of intraocular lens: Secondary | ICD-10-CM | POA: Diagnosis not present

## 2019-08-23 ENCOUNTER — Ambulatory Visit: Payer: Medicare HMO | Admitting: Licensed Clinical Social Worker

## 2019-08-23 ENCOUNTER — Other Ambulatory Visit: Payer: Self-pay

## 2019-08-23 DIAGNOSIS — E440 Moderate protein-calorie malnutrition: Secondary | ICD-10-CM

## 2019-08-23 DIAGNOSIS — Z7189 Other specified counseling: Secondary | ICD-10-CM

## 2019-08-23 NOTE — Patient Instructions (Signed)
Licensed Clinical Social Worker Visit Information Ms. Aten  it was nice speaking with you. Please call me directly if you have questions [phone] Goals we discussed today:  Goals Addressed            This Visit's Progress   . Nutrition Supplements   On track    CARE PLAN ENTRY (see longtitudinal plan of care for additional care plan information)  Current Barriers & Progress:  . Patient with COPD and DMII needs food/nutrition supplements due to not being able to eat solid food,  . Acknowledges deficits and needs support, education and care coordination in order to meet this unmet need  . Unable to afford Ensure . Patient signed Ensure application for patient assistance  Clinical Goal(s):  Over the next 30 days, patient will receive samples and assistance with food supplements and be able to manager her health  Interventions: . Assessed patient needs, reviewed application with patient and provided patient with copy of signed documents  . Provided patient with ensure samples approved by PCP . Collaborated with PCP regarding patient needs  . Request PCP to sign [employer] patient assistance  forms ( placed in mailbox 08/23/19) Patient Self Care Activities: . Patient is unable to independently navigate getting food/nutrition supplements without care coordination support  Initial goal documentation        Materials provided: Yes: ensure samples Ms. Eulas Post received Care Management services today:  1. Care Management services include personalized support from designated clinical staff supervised by her physician, including individualized plan of care and coordination with other care providers 2. 24/7 contact [phone] for assistance for urgent and routine care needs. 3. Care Management are voluntary services and be declined at any time by calling the office.  Patient  verbally agreed to assistance and services provided by embedded care coordination/care management team today.    Patient verbalizes understanding of instructions provided today.  Follow up plan:  SW will follow up with patient by phone over the next 2 weeks  Maurine Cane, LCSW

## 2019-08-23 NOTE — Chronic Care Management (AMB) (Signed)
Care Management   Clinical Social Work Follow Up   08/23/2019 Name: [name] MRN: [id number] DOB: [birth year]  Referred by: [name], DO  Reason for referral : Care Coordination (nutrition supplement assistance )  [name] is a 64 y.o. year old female who is a primary care patient of [name], DO.  Reason for follow-up: Office encounter with patient today for ongoing assessment and brief interventions to assist with care coordination needs.    Review of patient status, including review of consultants reports, relevant laboratory and other test results, and collaboration with appropriate care team members and the patient's provider was performed as part of comprehensive patient evaluation and provision of care management services.    Advance Directive Status: Not addressed during this encounter SDOH (Social Determinants of Health) assessments performed: Yes no needs identified   Goals Addressed            This Visit's Progress   . Nutrition Supplements   On track    CARE PLAN ENTRY (see longtitudinal plan of care for additional care plan information)  Current Barriers & Progress:  . Patient with COPD and DMII needs food/nutrition supplements due to not being able to eat solid food,  . Acknowledges deficits and needs support, education and care coordination in order to meet this unmet need  . Unable to afford Ensure . Patient signed Ensure application for patient assistance  Clinical Goal(s):  Over the next 30 days, patient will receive samples and assistance with food supplements and be able to manager her health  Interventions: . Assessed patient needs, reviewed application with patient and provided patient with copy of signed documents  . Provided patient with ensure samples approved by PCP . Collaborated with PCP regarding patient needs  . Request PCP to sign [employer] patient assistance  forms ( placed in mailbox 08/23/19) Patient Self  Care Activities: . Patient is unable to independently navigate getting food/nutrition supplements without care coordination support  Initial goal documentation         Outpatient Encounter Medications as of 08/23/2019  Medication Sig  . acetaminophen (TYLENOL) 500 MG tablet Take 1,000 mg by mouth every 6 (six) hours as needed for mild pain.  . Amino Acids-Protein Hydrolys (FEEDING SUPPLEMENT, PRO-STAT SUGAR FREE 64,) LIQD Take 30 mLs by mouth 3 (three) times daily with meals.  [name] atorvastatin (LIPITOR) 80 MG tablet Take 1 tablet (80 mg total) by mouth every morning.  . Blood Glucose Monitoring Suppl (ONE TOUCH ULTRA 2) W/DEVICE KIT 1 kit by Does not apply route once.  . candesartan (ATACAND) 4 MG tablet Take 1 tablet by mouth once daily  . cyclobenzaprine (FLEXERIL) 10 MG tablet Take 1 tablet (10 mg total) by mouth 3 (three) times daily as needed for muscle spasms.  . dapagliflozin propanediol (FARXIGA) 10 MG TABS tablet Take 10 mg by mouth daily.  . famotidine (PEPCID) 40 MG tablet Take 1 tablet (40 mg total) by mouth daily.  . feeding supplement, ENSURE ENLIVE, (ENSURE ENLIVE) LIQD Take 237 mLs by mouth 3 (three) times daily between meals.  . gabapentin (NEURONTIN) 100 MG capsule Take 1 capsule (100 mg total) by mouth 3 (three) times daily.  [name] glucose blood (ONE TOUCH ULTRA TEST) test strip USE 1 STRIP TO CHECK GLUCOSE THREE TIMES DAILY  . insulin aspart (NOVOLOG) 100 UNIT/ML injection Inject 4-8 Units into the skin 3 (three) times daily with meals.  . insulin degludec (TRESIBA FLEXTOUCH) 100 UNIT/ML SOPN FlexTouch  Pen Inject 0.4 mLs (40 Units total) into the skin daily.  . Insulin Pen Needle 33G X 4 MM MISC 1 Units by Does not apply route 5 (five) times daily.  . Lancets (ONETOUCH ULTRASOFT) lancets Use as instructed  . metoprolol succinate (TOPROL-XL) 50 MG 24 hr tablet Take 50 mg by mouth daily. Take with or immediately following a meal.  . mirtazapine (REMERON) 15 MG tablet Take 1 tablet  (15 mg total) by mouth at bedtime.  . ONE TOUCH LANCETS MISC 1 Device by Does not apply route 3 (three) times daily.  [name] torsemide (DEMADEX) 20 MG tablet Take 1 tablet (20 mg total) by mouth 2 (two) times daily.   No facility-administered encounter medications on file as of 08/23/2019.   Plan:  1. LCSW will fax application once signed and completed by PCP 2. LCSW will reach out to patient and provide an updated in the next 2 weeks  [name], [place] / [place]   [phone] 10:41 AM

## 2019-08-24 ENCOUNTER — Other Ambulatory Visit: Payer: Self-pay | Admitting: Pharmacist

## 2019-08-24 NOTE — Patient Outreach (Addendum)
[facility]) Care Management  [place]   08/24/2019  [name] Sep 29, 1955 [id number]  Reason for referral: Medication Review / Polypharmacy  Referral source: Dr. Tarry Kos' office Current insurance: [employer]   Outreach:  Unsuccessful telephone call attempt #1 to patient. HIPAA compliant voicemail left requesting a return call  Plan:  -I will make another outreach attempt to patient within 3-4 business days.   [name], PharmD, [place] [phone]

## 2019-08-29 ENCOUNTER — Other Ambulatory Visit: Payer: Self-pay | Admitting: Pharmacist

## 2019-08-29 ENCOUNTER — Ambulatory Visit: Payer: Self-pay | Admitting: Pharmacist

## 2019-08-29 NOTE — Patient Outreach (Signed)
[facility])  [place] Team    08/29/2019  [name] 03/14/1956 [id number]  Reason for referral: Medication Review / Polypharmacy  Referral source: Dr. Tarry Kos' office Current insurance: [employer]alling  Outreach:  Unsuccessful telephone call attempt #2 to patient. HIPAA compliant voicemail left requesting a return call  Plan:  -I will make another outreach attempt to patient within 3-4 business days.   Ralene Bathe, PharmD, [place] [phone]

## 2019-08-31 ENCOUNTER — Ambulatory Visit: Payer: Self-pay | Admitting: Pharmacist

## 2019-08-31 ENCOUNTER — Other Ambulatory Visit: Payer: Self-pay | Admitting: Pharmacist

## 2019-08-31 NOTE — Patient Outreach (Signed)
[facility]) Care Management  [place]   08/31/2019  [name] Oct 31, 1955 [id number]  Reason for referral: Medication Review / Polypharmacy  Referral source: Dr. Tarry Kos' office Current insurance: [employer] Falling  Outreach:  Unsuccessful telephone call attempt #3 to patient. HIPAA compliant voicemail left requesting a return call  Plan:  -I will close [place] case at this time as I have been unable to establish and/or maintain contact with patient.    Ralene Bathe, PharmD, [place] [phone]

## 2019-09-01 ENCOUNTER — Ambulatory Visit: Payer: Self-pay | Admitting: Licensed Clinical Social Worker

## 2019-09-01 NOTE — Chronic Care Management (AMB) (Signed)
   Social Work  Care Management Collaboration 09/01/2019 Name: [name] MRN: [id number] DOB: Sep 03, 1955  [name] is a 64 y.o. year old female who sees [place], Archie Endo, DO for primary care. LCSW was consulted by PCP to assistance patient with  Obtaining nutrition supplements. Patient was not interviewed or contacted during this encounter.  Intervention:  Review of patient status, including review of consultants reports, relevant laboratory and other test results, and collaboration with appropriate care team members and the patient's provider was performed as part of comprehensive patient evaluation and provision of chronic care management services.   Goals Addressed            This Visit's Progress   . Nutrition Supplements   On track    CARE PLAN ENTRY (see longtitudinal plan of care for additional care plan information)  Current Barriers & Progress:  . Patient with COPD and DMII needs food/nutrition supplements due to not being able to eat solid food,  . Acknowledges deficits and needs support, education and care coordination in order to meet this unmet need  . Unable to afford Ensure . Patient signed Ensure application for patient assistance  Clinical Goal(s):  Over the next 30 days, patient will receive samples and assistance with food supplements and be able to manager her health  Interventions: . Assessed patient needs, reviewed application with patient and provided patient with copy of signed documents  . Provided patient with ensure samples approved by PCP . Collaborated with PCP regarding patient needs, applications signed . Edmund faxed patient assistance application and [employer] for Ensure on 09/01/19 Patient Self Care Activities: . Patient is unable to independently navigate getting food/nutrition supplements without care coordination support  Please see past updates related to this goal by clicking on the "Past Updates" button in the selected goal        Plan: LCSW will F/U with patient in 1 to 2 weeks  [name], [place] / [place]   [phone] 9:49 AM

## 2019-09-02 ENCOUNTER — Telehealth: Payer: Self-pay | Admitting: Family Medicine

## 2019-09-02 NOTE — Telephone Encounter (Signed)
Received a notification that patient was denied nutrition assistance based on caloric criteria.  Spoke to representative at [employer] patient assistant program to determine more details on denial.  She was denied based on caloric criteria not met.  This was an error as she does require 100% of calories to be met by the product.  Faxed correct information to [phone]. Will await there reevaluation and decision.

## 2019-09-06 ENCOUNTER — Telehealth: Payer: Medicare HMO

## 2019-09-07 ENCOUNTER — Ambulatory Visit: Payer: Self-pay | Admitting: Licensed Clinical Social Worker

## 2019-09-07 NOTE — Chronic Care Management (AMB) (Signed)
    Clinical Social Work Care Management  Unsuccessful Outreach   09/07/2019 Name: [name] MRN: [id number] DOB: Feb 04, 1956  [name] is a 64 y.o. year old female who is a primary care patient of [name], DO .   LCSW reached out to [name] to provider her with an update on the Ensure patient assistance application. If [employer]ure patient assistance is unable to reach patient they will close out the request and patient will have to re-apply.  Outreach was unsuccessful.   A HIPPA compliant phone message was left for the patient providing contact information and requesting a return call.   Review of patient status, including review of consultants reports, relevant laboratory and other test results, and collaboration with appropriate care team members and the patient's provider was performed as part of comprehensive patient evaluation and provision of care management services.   Goals Addressed            This Visit's Progress   . Nutrition Supplements   On track    CARE PLAN ENTRY (see longtitudinal plan of care for additional care plan information)  Current Barriers & Progress:  . Patient with COPD and DMII needs food/nutrition supplements due to not being able to eat solid food,  . Acknowledges deficits and needs support, education and care coordination in order to meet this unmet need  . Unable to afford Ensure . Patient signed Ensure application for patient assistance  . LCSW informed by [employer] patient assistance that patient was approved for Ensure they have tried to contact patient but has not been successful Clinical Goal(s):  Over the next 30 days, patient will receive samples and assistance with food supplements and be able to manager her health  Interventions: . Assessed patient needs, reviewed application with patient and provided patient with copy of signed documents  . Provided patient with ensure samples approved by PCP . Collaborated with PCP  regarding patient needs, applications signed . LSCW called patient and asked her to call patient assistance as she has been approved Patient Self Care Activities: . Patient is unable to independently navigate getting food/nutrition supplements without care coordination support  Please see past updates related to this goal by clicking on the "Past Updates" button in the selected goal       Follow Up Plan: If no return call is received LCSW will call again in 3 to  5 days.  [name], LCSW Clinical Social Worker [place] / [place]   [phone] 3:04 PM

## 2019-09-08 ENCOUNTER — Other Ambulatory Visit: Payer: Self-pay

## 2019-09-08 ENCOUNTER — Encounter: Payer: Self-pay | Admitting: Registered"

## 2019-09-08 ENCOUNTER — Encounter: Payer: Medicare HMO | Attending: Family Medicine | Admitting: Registered"

## 2019-09-08 ENCOUNTER — Telehealth: Payer: Self-pay | Admitting: Family Medicine

## 2019-09-08 DIAGNOSIS — E119 Type 2 diabetes mellitus without complications: Secondary | ICD-10-CM | POA: Diagnosis not present

## 2019-09-08 NOTE — Telephone Encounter (Signed)
Spoke to patient to inform her that her application for assistance to receive Ensure was approved. They have been trying to contact her to inform her and if they dont speak to her they will close the case.  She was unaware of this.  Informed her that [name] was trying to reach her and left a message with the contact information.  She noted she had not checked her voice messages yet, plans to do so and will contact them.

## 2019-09-08 NOTE — Progress Notes (Signed)
Diabetes Self-Management Education  Visit Type:  First/Initial  Appt. Start Time: 9:12 Appt. End Time: 10:24  09/08/2019  [name], identified by name and date of birth, is a 64 y.o. female with a diagnosis of Diabetes: Type 2.   ASSESSMENT  This note is not being shared with the patient for the following reason: To prevent harm (release of this note would result in harm to the life or physical safety of the patient or another).  Pt arrives reporting medical history: CHF, manic depression, bipolar (working on getting a therapist via [employer]). States she recently moved to [facility] because previous living conditions in an upper-level apartment was too strenuous for her.   Reports she does not have an appetite. Recently provided Ensure Enlive. States Care Coach is working to have meals delivered to pt's home. States he does not have any teeth making it hard to eat. States she needs to have mouth surgery. States she cannot break down meats enough to chew and swallow. Mainly eats chicken franks, fish, boiled eggs as protein options. States she likes a variety of beans, all except black beans. States she is only allowed 2000 mg sodium along with a daily fluid restriction. States she has tried Mrs. Dash, no salt All Purpose seasoning and adds salt because food is not flavored enough.   States she checks BS 1-2x/day: 49-250. Only checks when she doesn't physically feel well. States some days she wakes up and is not in the mood to check her BS. States she is supposed to check BS before she eats but states she doesn't eat much so only checking 1-2x/day. States she was told to walk but nothing strenuous for physical activity.   Reports she has 4 adult children. States she is more secluded, doesn't see anyone and doesn't talk to anyone especially since moving to [facility]. States husband is supportive. He lives in [place]. States it is hard for her to be around her family because she  has to hide her mental conditions with them because they don't understand.     There were no vitals taken for this visit. There is no height or weight on file to calculate BMI.   Diabetes Self-Management Education - 09/08/19 0937      Health Coping   How would you rate your overall health?  Poor      Psychosocial Assessment   Patient Belief/Attitude about Diabetes  Afraid    Self-care barriers  None;Low literacy    Self-management support  Family    Patient Concerns  Nutrition/Meal planning    Special Needs  Simplified materials    Preferred Learning Style  Auditory;Visual    Learning Readiness  Ready      Complications   Last HgB A1C per patient/outside source  10.1 %    How often do you check your blood sugar?  3-4 times/day    Fasting Blood glucose range (mg/dL)  <70    Postprandial Blood glucose range (mg/dL)  >200;180-200;130-179    Number of hypoglycemic episodes per month  4    Can you tell when your blood sugar is low?  Yes    What do you do if your blood sugar is low?  will eat candy or drink Pepsi    Number of hyperglycemic episodes per week  0    Have you had a dilated eye exam in the past 12 months?  Yes    Have you had a dental exam in the past  12 months?  No    Are you checking your feet?  Yes    How many days per week are you checking your feet?  3      Dietary Intake   Breakfast  frosted flakes + peaches (fruit cup) + 2% milk    Lunch  fish (fried then seared) + 1/2 slice of bread    Dinner  1 c Lipton's chicken noodle soup (in box) + chopped green onions + 4 PB crackers + 1/2 bottle of Coke + 1/2 glass of water    Beverage(s)  2% milk, 1/2 bottle of Coke, 1/2 glass of water      Exercise   Exercise Type  ADL's    How many days per week to you exercise?  0    How many minutes per day do you exercise?  0    Total minutes per week of exercise  0      Patient Education   Previous Diabetes Education  Yes (please comment)    Nutrition management   Food  label reading, portion sizes and measuring food.;Information on hints to eating out and maintain blood glucose control.;Meal options for control of blood glucose level and chronic complications.;Role of diet in the treatment of diabetes and the relationship between the three main macronutrients and blood glucose level    Physical activity and exercise   Role of exercise on diabetes management, blood pressure control and cardiac health.    Medications  Reviewed patients medication for diabetes, action, purpose, timing of dose and side effects.    Acute complications  Taught treatment of hypoglycemia - the 15 rule.;Discussed and identified patients' treatment of hyperglycemia.      Individualized Goals (developed by patient)   Nutrition  Follow meal plan discussed;General guidelines for healthy choices and portions discussed    Medications  take my medication as prescribed    Reducing Risk  treat hypoglycemia with 15 grams of carbs if blood glucose less than 70mg /dL      Post-Education Assessment   Patient understands the diabetes disease and treatment process.  Needs Review    Patient understands incorporating nutritional management into lifestyle.  Needs Review    Patient undertands incorporating physical activity into lifestyle.  Needs Review    Patient understands using medications safely.  Needs Review    Patient understands monitoring blood glucose, interpreting and using results  Needs Review    Patient understands prevention, detection, and treatment of acute complications.  Demonstrates understanding / competency    Patient understands prevention, detection, and treatment of chronic complications.  Needs Review    Patient understands how to develop strategies to address psychosocial issues.  Needs Review    Patient understands how to develop strategies to promote health/change behavior.  Demonstrates understanding / competency      Outcomes   Program Status  Not Completed        Learning Objective:  Patient will have a greater understanding of diabetes self-management. Patient education plan is to attend individual and/or group sessions per assessed needs and concerns.   Plan:   Patient Instructions  - Have 3 meals a day. Use handout as guide.   - Write down meals and snacks each day.   - Have Ensure when not eating meals.     Expected Outcomes:  Demonstrated interest in learning. Expect positive outcomes  Education material provided: My Plate  If problems or questions, patient to contact team via:  Phone and Email  Future  DSME appointment: - 4-6 wks

## 2019-09-08 NOTE — Patient Instructions (Signed)
-   Have 3 meals a day. Use handout as guide.   - Write down meals and snacks each day.   - Have Ensure when not eating meals.

## 2019-09-09 ENCOUNTER — Ambulatory Visit: Payer: Medicare HMO | Admitting: Family Medicine

## 2019-09-13 ENCOUNTER — Ambulatory Visit: Payer: Self-pay | Admitting: Licensed Clinical Social Worker

## 2019-09-13 NOTE — Chronic Care Management (AMB) (Signed)
  Social Work Care Management  Unsuccessful Phone Outreach   09/13/2019 Name: AZARAH DACY MRN: [id number] DOB: July 14, 1955  Referred by: [name], DO,  Reason for referral : Care Coordination (F/U call)   Caylen IVI GRIFFITH is a 64 y.o. year old female who sees [place], Archie Endo, DO for primary care.  Called patient to assess needs and barriers to see if she was able to connect to patient assistance for Ensure.  Telephone outreach was unsuccessful. A HIPPA compliant phone message was left for the patient providing contact information and requesting a return call.  Plan:  If no return call is received. LCSW will call again tomorrow.  [name], LCSW Clinical Social Worker [place] / [place]   [phone] 11:41 AM

## 2019-09-14 ENCOUNTER — Other Ambulatory Visit: Payer: Self-pay

## 2019-09-14 ENCOUNTER — Ambulatory Visit: Payer: Medicare HMO | Admitting: Licensed Clinical Social Worker

## 2019-09-14 DIAGNOSIS — E440 Moderate protein-calorie malnutrition: Secondary | ICD-10-CM

## 2019-09-14 DIAGNOSIS — Z7189 Other specified counseling: Secondary | ICD-10-CM

## 2019-09-14 NOTE — Chronic Care Management (AMB) (Signed)
Care Management   Clinical Social Work Follow Up   09/14/2019 Name: [name] MRN: [id number] DOB: 1956-04-23  Referred by: [name], DO  Reason for referral : Care Coordination (F/U call)  [name] is a 64 y.o. year old female who is a primary care patient of [name], DO.  Reason for follow-up: Phone encounter with patient today for ongoing assessment and brief interventions to assist with care coordination needs.   Assessment: Patient is doing well and appreciative of assistance with obtaining Ensure  Review of patient status, including review of consultants reports, relevant laboratory and other test results, and collaboration with appropriate care team members and the patient's provider was performed as part of comprehensive patient evaluation and provision of care management services.   SDOH (Social Determinants of Health) assessments performed: No  Goals Addressed            This Visit's Progress   . COMPLETED: Nutrition Supplements       CARE PLAN ENTRY (see longtitudinal plan of care for additional care plan information)  Current Barriers & Progress:  . Patient with COPD and DMII needs food/nutrition supplements due to not being able to eat solid food,  . Acknowledges deficits and needs support, education and care coordination in order to meet this unmet need  . Unable to afford Ensure . Patient signed Ensure application for patient assistance and application faxed . Patient has spoken with patient assistance with Ensure and will receive several cases of Ensure in 1 to 3 weeks Clinical Goal(s):  Over the next 30 days, patient will receive samples and assistance with food supplements and be able to manager her health  Interventions: . Assessed patient needs and barriers   . Provided patient with ensure samples approved by PCP . Collaborated with PCP regarding patient needs, applications signed Patient Self Care Activities: . Patient is  unable to independently navigate getting food/nutrition supplements without care coordination support  Please see past updates related to this goal by clicking on the "Past Updates" button in the selected goal         Outpatient Encounter Medications as of 09/14/2019  Medication Sig  . acetaminophen (TYLENOL) 500 MG tablet Take 1,000 mg by mouth every 6 (six) hours as needed for mild pain.  . Amino Acids-Protein Hydrolys (FEEDING SUPPLEMENT, PRO-STAT SUGAR FREE 64,) LIQD Take 30 mLs by mouth 3 (three) times daily with meals.  [name] atorvastatin (LIPITOR) 80 MG tablet Take 1 tablet (80 mg total) by mouth every morning.  . Blood Glucose Monitoring Suppl (ONE TOUCH ULTRA 2) W/DEVICE KIT 1 kit by Does not apply route once.  . candesartan (ATACAND) 4 MG tablet Take 1 tablet by mouth once daily  . cyclobenzaprine (FLEXERIL) 10 MG tablet Take 1 tablet (10 mg total) by mouth 3 (three) times daily as needed for muscle spasms.  . dapagliflozin propanediol (FARXIGA) 10 MG TABS tablet Take 10 mg by mouth daily.  . famotidine (PEPCID) 40 MG tablet Take 1 tablet (40 mg total) by mouth daily. (Patient not taking: Reported on 09/08/2019)  . feeding supplement, ENSURE ENLIVE, (ENSURE ENLIVE) LIQD Take 237 mLs by mouth 3 (three) times daily between meals.  . gabapentin (NEURONTIN) 100 MG capsule Take 1 capsule (100 mg total) by mouth 3 (three) times daily.  [name] glucose blood (ONE TOUCH ULTRA TEST) test strip USE 1 STRIP TO CHECK GLUCOSE THREE TIMES DAILY  . insulin aspart (NOVOLOG) 100 UNIT/ML injection Inject 4-8 Units into  the skin 3 (three) times daily with meals.  . insulin degludec (TRESIBA FLEXTOUCH) 100 UNIT/ML SOPN FlexTouch Pen Inject 0.4 mLs (40 Units total) into the skin daily.  . Insulin Pen Needle 33G X 4 MM MISC 1 Units by Does not apply route 5 (five) times daily.  . Lancets (ONETOUCH ULTRASOFT) lancets Use as instructed  . metoprolol succinate (TOPROL-XL) 50 MG 24 hr tablet Take 50 mg by mouth daily.  Take with or immediately following a meal.  . mirtazapine (REMERON) 15 MG tablet Take 1 tablet (15 mg total) by mouth at bedtime.  . ONE TOUCH LANCETS MISC 1 Device by Does not apply route 3 (three) times daily.  [name] torsemide (DEMADEX) 20 MG tablet Take 1 tablet (20 mg total) by mouth 2 (two) times daily.     Plan: No follow up scheduled with LCSW.  Patient will contact office if new needs arise  [name], [place] / [place]   [phone] 11:24 AM

## 2019-09-14 NOTE — Patient Instructions (Signed)
Licensed Clinical Social Worker Visit Information [name]  it was nice speaking with you. Please call me directly if you have questions [phone] Goals we discussed today:  Goals Addressed            This Visit's Progress   . COMPLETED: Nutrition Supplements       CARE PLAN ENTRY (see longtitudinal plan of care for additional care plan information)  Current Barriers & Progress:  . Patient with COPD and DMII needs food/nutrition supplements due to not being able to eat solid food,  . Acknowledges deficits and needs support, education and care coordination in order to meet this unmet need  . Unable to afford Ensure . Patient signed Ensure application for patient assistance and application faxed . Patient has spoken with patient assistance with Ensure and will receive several cases of Ensure in 1 to 3 weeks Clinical Goal(s):  Over the next 30 days, patient will receive samples and assistance with food supplements and be able to manager her health  Interventions: . Assessed patient needs and barriers   . Provided patient with ensure samples approved by PCP . Collaborated with PCP regarding patient needs, applications signed Patient Self Care Activities: . Patient is unable to independently navigate getting food/nutrition supplements without care coordination support  Please see past updates related to this goal by clicking on the "Past Updates" button in the selected goal       Materials provided:  [name] received Care Management services today:  1. Care Management services include personalized support from designated clinical staff supervised by her physician, including individualized plan of care and coordination with other care providers 2. 24/7 contact [phone] for assistance for urgent and routine care needs. 3. Care Management are voluntary services and be declined at any time by calling the office. Patient verbally agreed to assistance and services provided by  embedded care coordination/care management team today.   Patient verbalizes understanding of instructions provided today.  Follow up plan: No F/U scheduled   Maurine Cane, LCSW

## 2019-09-20 ENCOUNTER — Other Ambulatory Visit: Payer: Self-pay | Admitting: Family Medicine

## 2019-09-20 DIAGNOSIS — Z1231 Encounter for screening mammogram for malignant neoplasm of breast: Secondary | ICD-10-CM

## 2019-10-06 ENCOUNTER — Ambulatory Visit: Payer: Medicare HMO | Admitting: Registered"

## 2019-12-01 ENCOUNTER — Encounter: Payer: Self-pay | Admitting: Family Medicine

## 2019-12-01 ENCOUNTER — Other Ambulatory Visit (HOSPITAL_COMMUNITY)
Admission: RE | Admit: 2019-12-01 | Discharge: 2019-12-01 | Disposition: A | Discharge status: Home / Self Care | Payer: Medicare HMO | Source: Ambulatory Visit | Attending: Family Medicine | Admitting: Family Medicine

## 2019-12-01 ENCOUNTER — Ambulatory Visit (INDEPENDENT_AMBULATORY_CARE_PROVIDER_SITE_OTHER): Payer: Medicare HMO | Admitting: Family Medicine

## 2019-12-01 ENCOUNTER — Other Ambulatory Visit: Payer: Self-pay

## 2019-12-01 VITALS — BP 142/78 | HR 101 | Temp 98.7°F | Wt 117.0 lb

## 2019-12-01 DIAGNOSIS — R69 Illness, unspecified: Secondary | ICD-10-CM | POA: Diagnosis not present

## 2019-12-01 DIAGNOSIS — E785 Hyperlipidemia, unspecified: Secondary | ICD-10-CM

## 2019-12-01 DIAGNOSIS — E1169 Type 2 diabetes mellitus with other specified complication: Secondary | ICD-10-CM | POA: Diagnosis not present

## 2019-12-01 DIAGNOSIS — R300 Dysuria: Secondary | ICD-10-CM

## 2019-12-01 DIAGNOSIS — F332 Major depressive disorder, recurrent severe without psychotic features: Secondary | ICD-10-CM | POA: Diagnosis not present

## 2019-12-01 DIAGNOSIS — Z113 Encounter for screening for infections with a predominantly sexual mode of transmission: Secondary | ICD-10-CM | POA: Diagnosis not present

## 2019-12-01 DIAGNOSIS — F3181 Bipolar II disorder: Secondary | ICD-10-CM

## 2019-12-01 DIAGNOSIS — N898 Other specified noninflammatory disorders of vagina: Secondary | ICD-10-CM | POA: Insufficient documentation

## 2019-12-01 DIAGNOSIS — E1165 Type 2 diabetes mellitus with hyperglycemia: Secondary | ICD-10-CM | POA: Diagnosis not present

## 2019-12-01 DIAGNOSIS — E1140 Type 2 diabetes mellitus with diabetic neuropathy, unspecified: Secondary | ICD-10-CM | POA: Diagnosis not present

## 2019-12-01 DIAGNOSIS — IMO0002 Reserved for concepts with insufficient information to code with codable children: Secondary | ICD-10-CM

## 2019-12-01 DIAGNOSIS — B3731 Acute candidiasis of vulva and vagina: Secondary | ICD-10-CM | POA: Insufficient documentation

## 2019-12-01 DIAGNOSIS — I10 Essential (primary) hypertension: Secondary | ICD-10-CM | POA: Diagnosis not present

## 2019-12-01 DIAGNOSIS — B373 Candidiasis of vulva and vagina: Secondary | ICD-10-CM | POA: Diagnosis not present

## 2019-12-01 DIAGNOSIS — Z794 Long term (current) use of insulin: Secondary | ICD-10-CM | POA: Diagnosis not present

## 2019-12-01 LAB — POCT URINALYSIS DIP (MANUAL ENTRY)
Bilirubin, UA: NEGATIVE
Glucose, UA: >=1000 mg/dL — AB
Leukocytes, UA: NEGATIVE
Nitrite, UA: NEGATIVE
Protein Ur, POC: NEGATIVE mg/dL
Spec Grav, UA: <=1.005 — AB (ref 1.010–1.025)
Urobilinogen, UA: 0.2 E.U./dL
pH, UA: 5.5 (ref 5.0–8.0)

## 2019-12-01 LAB — POCT GLYCOSYLATED HEMOGLOBIN (HGB A1C): HbA1c, POC (controlled diabetic range): 12.9 % — AB (ref 0.0–7.0)

## 2019-12-01 LAB — POCT WET PREP (WET MOUNT)
Clue Cells Wet Prep Whiff POC: NEGATIVE
Trichomonas Wet Prep HPF POC: ABSENT

## 2019-12-01 LAB — POCT UA - MICROSCOPIC ONLY

## 2019-12-01 MED ORDER — FLUCONAZOLE 150 MG PO TABS: ORAL_TABLET | ORAL | 0 refills | Status: DC

## 2019-12-01 NOTE — Progress Notes (Addendum)
Subjective:   Patient ID: [name]    DOB: 1956/06/10, 64 y.o. female   MRN: [id number]  [name] is a 64 y.o. 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  Depression: She notes it's been going around since February. She notes she is feeling very down, crying all the time over everything. She notes thoughts of being better off dead, but denies any active thoughts or plan.  She feels this is contributing to why she isnt managing her medicines the right way. She notes decreased appetite. She endorses difficulty with sleep as well. She has a history of depression and bipolar disorder. She has been on something before but she "couldn't function" with the medicine so she stopped. She used to go to [place] but they dont accept her insurance. She is interested in therapy but notes she has no money to pay out of pocket fees.   Vaginal Itching: Patient notes she has been having vaginal itching. Patient also endorses pain when she wipes when she wipes primarily and pain with intercourse plus frequency. Endorses white vaginal discharge. Denies pelvic pain, dysuria, hematuria, urgency, fevers, chills. Sexually active once a week with her husband.  Review of Systems:  Per HPI.   Objective:   BP (!) 142/78    Pulse (!) 101    Temp 98.7 F (37.1 C) (Oral)    Wt 117 lb (53.1 kg)    SpO2 98%    BMI 21.40 kg/m  Vitals and nursing note reviewed.  General: Pleasant but very tearful older lady, sitting comfortably in exam chair with granddaughter at side, well nourished, well developed, with non-toxic appearance Resp: Speaking in full sentences, breathing comfortably on room air Skin: warm, dry Extremities: warm and well perfused MSK: gait normal Neuro: Alert and  oriented, speech normal Psych:  Cognition and judgment appear intact. Alert, communicative  and cooperative with normal attention span and concentration. No apparent delusions, illusions, hallucinations.  Very tearful throughout exam.  Flat affect.  Appears depressed but not anxious or manic.  Denies active SI/HI. Speech is pressured and at times cluttered making it unintelligible. Having delusions of grandeur Pelvic exam: VULVA: normal appearing vulva with no masses, tenderness or lesions, VAGINA: atrophic but no bleeding, vaginal discharge - white and creamy. Patient has history of partial hysterectomy, no cervix identified.  Depression screen [facility] 2/9 12/01/2019 09/08/2019 08/18/2019  Decreased Interest 3 0 0  Down, Depressed, Hopeless 3 3 0  PHQ - 2 Score 6 3 0  Altered sleeping 3 1 -  Tired, decreased energy 3 3 -  Change in appetite 3 3 -  Feeling bad or failure about yourself  3 3 -  Trouble concentrating 3 1 -  Moving slowly or fidgety/restless 0 3 -  Suicidal thoughts 1 1 -  PHQ-9 Score 22 18 -  Difficult doing work/chores Extremely dIfficult Somewhat difficult -  Some recent data might be hidden    Assessment & Plan:   Uncontrolled type 2 diabetes mellitus with diabetic neuropathy, with long-term current use of insulin (HCC) A1C significantly worse at 12.9 today from 10 in 07/2019. Encounter diverted from chronic care management to focus on her acute severe depression. Give acute on chronic MDD/Bipolar 2 Disorder, she has not been managing her chronic diseases very well. Plan to  follow up on 12/09/19 via virtual visit to discuss her chronic diseases. Believe she would greatly benefit from help of CCM as well. CCM consulted.  Hyperlipidemia due to type 2 diabetes mellitus (HCC) Lipid panel obtained today. Follow up scheduled for 12/09/19 to discuss results and management plan.  Primary hypertension BP elevated today. Likely related to her current MDD. Will defer changes in management  at this time. Follow up on 12/09/19 to further evalaute. Continue Candesartan 25mg  QD at this time.  Severe episode of recurrent major depressive disorder, without psychotic features ([place]) During visit patient endorsed severe depression and passive thoughts of suicide.  She was very tearful throughout exam.  PHQ-9 score of 22 with several days to question 9.  MDQ score difficult to interpret however 5 out of 13 were indicated yes, question 2 yes, question 3-serious problem.  She denied any active thoughts of suicide or plan.  She has no guns in her home.  She does have a history of suicidal attempts in the past.  She has a history of bipolar 2 disorder in which appears she was treated with Abilify, Effexor, and trazodone.  She notes intolerance to these medications in the past as they made her very sleepy all the time.  She is currently on mirtazapine for depression and appetite stimulation.  She has been evaluated by [facility] however was unable to afford their co-pay.  Money is a significant barrier for patient.  Had a long discussion with patient in regards to management options.  I feel patient would benefit from both psycho-and pharmacological therapy. Given intolerance to Abilify in the past, we will hold off on starting medications at this time.  Extensive resource list provided for patient to call and schedule an appointment with therapy and psychiatrist.  I have scheduled a follow up appointment with patient for 12/09/2019.  I have also consulted [name] with CCM for continued follow-up until she can establish care with psychiatrist. Safety plan discussed with the patient. They are aware of how to contact crisis services if need be and instructed to go to the nearest emergency room if they feel they are in imminent danger of harming themselves and or others, or symptoms are of out control or unbearable.   Vulvovaginal candidiasis Patient endorsed vaginal itching and pain when she wipes.   Denies dysuria but endorses frequency.  UA negative for infection but she had significant glucosuria which is likely contributing to her frequency.  Wet prep notable for yeast which is likely from uncontrolled diabetes.  Will treat with Diflucan: 1 pill now, then another in 72 hours. RTC if now improvement or worsening. Follow up scheduled for 6/25 for diabetes management. - patient also desired STD screening. GC/Cl, HIV, RPR obtained. Will call in positive and treat if indicated.   Orders Placed This Encounter  Procedures   Urine Culture   Lipid panel   Basic Metabolic Panel   HIV Antibody (routine testing w rflx)   RPR   Ambulatory referral to Chronic Care Management Services    Referral Priority:   Routine    Referral Type:   Consultation    Referral Reason:   Care Coordination    Number of Visits Requested:   1   POCT glycosylated hemoglobin (Hb A1C)   POCT Wet Prep [facility]   POCT urinalysis dipstick   POCT UA - Microscopic Only   Meds ordered this encounter  Medications   fluconazole (DIFLUCAN) 150 MG tablet    Sig:  Take 1 tablet now, then 1 tablet in 72 hours.    Dispense:  2 tablet    Refill:  0   [name], DO PGY-2, [place] [facility] 12/01/2019 6:06 PM

## 2019-12-01 NOTE — Patient Instructions (Addendum)
I am so sorry that you have been feeling so down recently.  Below is a list of therapy and psychiatrists that take your insurance.  I highly recommend that you call a psychiatrist and get an appointment as soon as possible.  I believe you would benefit greatly from both therapy and medication management.  Please call the emergency hotline if you have any worsening in symptoms.  Please feel free to call and schedule a follow-up appoint with me sooner if you need someone to talk to.  I have also consulted [name] who will also reach out to you for close follow-up.  Schedule you for a follow-up telemedicine visit for 12/09/2019 at 8:30 AM.  I will call you that morning for our appointment.  At this appointment we will talk about your diabetes and your blood pressure as well as your depression.  Call me if you need me sooner.  Therapy and Counseling Resources Most providers on this list will take Medicaid. Patients with commercial insurance or Medicare should contact their insurance company to get a list of in network providers.  [employer]  [address] [place], [place] [address]      [place] [address]  [place], [place] [address] [phone]  [place] [address] [place]  [place], [place] [address]       [phone]      Jinny Blossom [facility] [address], [place], [place]  [facility]:  [place]. [place]  [facility]:  [address] Loni Muse, [place]  [facility] (under & uninsured) [address], [place] [phone]    [email or web address]    [facility] [place]     Phone:  [phone]     [place]  [place] [address]      [place],  [place] [address] [place]: [place], [place], [place]   [place] ([place] therapist) [address] [place] [address]   [place] [address]    [phone]    [facility]   [place] [address],  [place], [place]   [place]  [place]  [facility]  [address] [place], [place]        [phone]  Open Access/Walk In Clinic under & uninsured  [place], To schedule an appointment call [phone] [address] [place] [phone]):  Fay Records from 8 AM - 3 PM Moving June 1 to [employer] at [facility] [address], [place],  ([place])   [place], [place]: [phone]) 8:30 - 12; 1 - 2:30  [facility] the [place],  [place], [place]    ([phone]):8:30 - 12; 2 - 3PM  [facility] [place],  [address]  [place]; [phone]):   Mon - Fri 8 AM - 5 PM  [facility] [place]  MWF 12:30 to 3:00 or call to schedule an appointment  [phone]  Specific Provider options Psychology Today  [email or web address] 1. click on find a therapist  2. enter your zip code 3. left side and select or tailor a therapist for your specific need.   [facility] Provider Directory [email or web address]  (Medicaid)   Follow all drop down to find a provider  [place] [phone] or [email or web address] [address], [place], [place] Recovery support and educational   24- Hour Availability:  . [place] or [phone]  . Family Service of the [employer] [phone]  [facility] Crisis Service  [phone]   . [place]  [phone] (after  hours)  . Therapeutic Alternative/Mobile Crisis   [phone]  . [place] National Suicide Hotline  [phone] ([place])  . Call 911 or go to emergency room  . [employer]  [phone]);  [place] and [employer]   . [facility] ACCESS  [phone]); [place], [place], [place], [place], [place], [place], [place]    Psychiatry Resource List (Adults and Children) Most of these providers will take Medicaid. please consult your insurance for a complete and updated list of available providers. When calling to make an appointment have your insurance information available to confirm you are covered.  [place]:   [place]: [address]     [phone]   Linna Hoff: [place],        [phone] : [place],    [place]: [place],                   [phone] Children: [place] [address]         [phone]  [place]  (Psychiatry & counseling ; adults & children ; will take Medicaid) [place], [place], [place]        [phone]   [place]  (Psychiatry only; Adults /children 12 and over, will take Medicaid)  [place], [place], [place] [address]       [phone]   [place] (Psychiatry & counseling ; adults & children ; will take Medicaid [address]  [place] [address]   Go on-line to complete referral ( [email or web address]-referral [phone]    (Spanish therapist)  [facility]  Psychiatry & counseling; Adults and children;  Call Registration prior to scheduling an appointment [phone] [address]    [place], [place] [address]    [phone]  [facility] (Psychiatry & counseling; adults & children; Medicare no Medicaid)  [place],   [address]      [phone]    [facility] (up to age 70)  Psychiatry & counseling ,will take Medicaid, must do counseling to receive psychiatry services  [address] [place] [address]        ([place] (Psychiatry & counseling; adults & children; will take Medicaid) Will need a referral from provider [address],  [place], [place]  [phone]  [facility]---  Walk-in Mon-Fri, 8:30-5:00 (will take Medicaid)  [address] [place], [place]  [phone]  To schedule an appointment call [phone]  [facility] --- Walk-In Mon-Friday 8am-3pm ( will take Medicaid, Psychiatry, Adults & children,  [address] [place], [place]   [phone]   [facility] [place]--, Walk-in M-F 8am-12pm and  1pm -3pm   (Counseling, Psychiatry, will take Medicaid, adults & children)  [address], [place], [place]  [phone]     If you are feeling suicidal or depression symptoms worsen please immediately go to:   24 Hour Availability Emergency Department or call 911   OR     [facility]  [address], [place], [place] [address]  [phone] or [phone]  . If you are thinking about harming yourself or having thoughts of suicide, or if you know someone who is, seek help right away. . Call your doctor or mental health care provider. . Call 911 or go to a hospital emergency room to get immediate help, or ask a friend or family member to help you do these things. . Call the [place] National Suicide Prevention Lifeline's toll-free, 24-hour hotline at 1-800-273-TALK 414-183-8160) or TTY: 1-800-799-4 TTY (680)417-3548) to talk to a trained counselor. . If you are in crisis, make sure you are not left alone.  . If someone else is in crisis, make sure he or she is not left alone   Family Service of the [employer] (Domestic Violence, Rape & Victim  Assistance [phone]  [facility]  [address], [place]  [address]               [phone] or [phone]  [place]    (ONLY from 8am-4pm)    [phone]  Therapeutic Alternative Mobile Crisis Unit (24/7)   [phone]  [place] National Suicide Hotline   2394850084 Diamantina Monks)

## 2019-12-01 NOTE — Assessment & Plan Note (Addendum)
A1C significantly worse at 12.9 today from 10 in 07/2019. Encounter diverted from chronic care management to focus on her acute severe depression. Give acute on chronic MDD/Bipolar 2 Disorder, she has not been managing her chronic diseases very well. Plan to follow up on 12/09/19 via virtual visit to discuss her chronic diseases. Believe she would greatly benefit from help of CCM as well. CCM consulted.

## 2019-12-01 NOTE — Assessment & Plan Note (Signed)
Lipid panel obtained today. Follow up scheduled for 12/09/19 to discuss results and management plan.

## 2019-12-01 NOTE — Assessment & Plan Note (Signed)
Patient endorsed vaginal itching and pain when she wipes.  Denies dysuria but endorses frequency.  UA negative for infection but she had significant glucosuria which is likely contributing to her frequency.  Wet prep notable for yeast which is likely from uncontrolled diabetes.  Will treat with Diflucan: 1 pill now, then another in 72 hours. RTC if now improvement or worsening. Follow up scheduled for 6/25 for diabetes management.

## 2019-12-01 NOTE — Assessment & Plan Note (Signed)
BP elevated today. Likely related to her current MDD. Will defer changes in management at this time. Follow up on 12/09/19 to further evalaute. Continue Candesartan 25mg  QD at this time.

## 2019-12-01 NOTE — Assessment & Plan Note (Signed)
During visit patient endorsed severe depression and passive thoughts of suicide.  She was very tearful throughout exam.  PHQ-9 score of 22 with several days to question 9.  MDQ score difficult to interpret however 5 out of 13 were indicated yes, question 2 yes, question 3-serious problem.  She denied any active thoughts of suicide or plan.  She has no guns in her home.  She does have a history of suicidal attempts in the past.  She has a history of bipolar 2 disorder in which appears she was treated with Abilify, Effexor, and trazodone.  She notes intolerance to these medications in the past as they made her very sleepy all the time.  She is currently on mirtazapine for depression and appetite stimulation.  She has been evaluated by [facility] however was unable to afford their co-pay.  Money is a significant barrier for patient.  Had a long discussion with patient in regards to management options.  I feel patient would benefit from both psycho-and pharmacological therapy. Given intolerance to Abilify in the past, we will hold off on starting medications at this time.  Extensive resource list provided for patient to call and schedule an appointment with therapy and psychiatrist.  I have scheduled a follow up appointment with patient for 12/09/2019.  I have also consulted [name] with CCM for continued follow-up until she can establish care with psychiatrist. Safety plan discussed with the patient. They are aware of how to contact crisis services if need be and instructed to go to the nearest emergency room if they feel they are in imminent danger of harming themselves and or others, or symptoms are of out control or unbearable.

## 2019-12-02 ENCOUNTER — Telehealth: Payer: Self-pay | Admitting: Family Medicine

## 2019-12-02 ENCOUNTER — Ambulatory Visit: Payer: Self-pay | Admitting: *Deleted

## 2019-12-02 LAB — BASIC METABOLIC PANEL
BUN/Creatinine Ratio: 18 (ref 12–28)
BUN: 18 mg/dL (ref 8–27)
CO2: 22 mmol/L (ref 20–29)
Calcium: 10.4 mg/dL — ABNORMAL HIGH (ref 8.7–10.3)
Chloride: 92 mmol/L — ABNORMAL LOW (ref 96–106)
Creatinine, Ser: 1.02 mg/dL — ABNORMAL HIGH (ref 0.57–1.00)
GFR calc Af Amer: 67 mL/min/{1.73_m2} (ref >59)
GFR calc non Af Amer: 58 mL/min/{1.73_m2} — ABNORMAL LOW (ref >59)
Glucose: 469 mg/dL — ABNORMAL HIGH (ref 65–99)
Potassium: 4.2 mmol/L (ref 3.5–5.2)
Sodium: 134 mmol/L (ref 134–144)

## 2019-12-02 LAB — CERVICOVAGINAL ANCILLARY ONLY
Chlamydia: NEGATIVE
Comment: NEGATIVE
Comment: NEGATIVE
Comment: NORMAL
Neisseria Gonorrhea: NEGATIVE
Trichomonas: NEGATIVE

## 2019-12-02 LAB — LIPID PANEL
Chol/HDL Ratio: 4.8 ratio — ABNORMAL HIGH (ref 0.0–4.4)
Cholesterol, Total: 304 mg/dL — ABNORMAL HIGH (ref 100–199)
HDL: 64 mg/dL (ref >39)
LDL Chol Calc (NIH): 209 mg/dL — ABNORMAL HIGH (ref 0–99)
Triglycerides: 164 mg/dL — ABNORMAL HIGH (ref 0–149)
VLDL Cholesterol Cal: 31 mg/dL (ref 5–40)

## 2019-12-02 LAB — RPR: RPR Ser Ql: NONREACTIVE

## 2019-12-02 LAB — HIV ANTIBODY (ROUTINE TESTING W REFLEX): HIV Screen 4th Generation wRfx: NONREACTIVE

## 2019-12-02 NOTE — Chronic Care Management (AMB) (Signed)
  Chronic Care Management   Note  12/02/2019 Name: ALBIE BAZIN MRN: [id number] DOB: 1955-08-27  Referral for pharmacy support forwarded to [place] team.   Goals Addressed            This Visit's Progress   . Medication Management and Education Needs       CARE PLAN ENTRY (see longitudinal plan of care for additional care plan information)  Current Barriers:  . Chronic Disease Management support and education needs related to poorly controlled DMII, Hyperlipidemia, and HTN   Nurse Case Manager Clinical Goal(s):  [name] Over the next 30 days, patient will work with [place] team pharmacist to address medication management and education needs  Interventions:  . Inter-disciplinary care team collaboration (see longitudinal plan of care) . Pharmacy referral for medication management and education needs  Patient Self Care Activities:  . Unable to independently adhere to prescribed medication regimen  Initial goal documentation        Follow up plan: The care management team will reach out to the patient again over the next 7 days.   [place] Management Coordinator Direct Dial:  [phone]  Fax: [phone]

## 2019-12-02 NOTE — Chronic Care Management (AMB) (Signed)
  Chronic Care Management   Outreach Note  12/02/2019 Name: DEADRA DIGGINS MRN: [id number] DOB: Nov 09, 1955  CHERELL COLVIN is a 64 y.o. year old female who is a primary care patient of [name], DO. I reached out to [employer] by phone today in response to a referral sent by [name] PCP, [name] DO     An unsuccessful telephone outreach was attempted today. The patient was referred to the case management team for assistance with care management and care coordination.   Follow Up Plan: A HIPPA compliant phone message was left for the patient providing contact information and requesting a return call.  The care management team will reach out to the patient again over the next 7 days.  If patient returns call to provider office, please advise to call Embedded Care Management Care Guide Glenna Durand LPN at [phone]  Mylena Sedberry, LPN Health Advisor, [place] Management ??[email or web address] ??[phone]

## 2019-12-03 LAB — URINE CULTURE: Organism ID, Bacteria: NO GROWTH

## 2019-12-05 ENCOUNTER — Telehealth: Payer: Self-pay

## 2019-12-05 ENCOUNTER — Other Ambulatory Visit: Payer: Self-pay | Admitting: Family Medicine

## 2019-12-05 MED ORDER — FLUCONAZOLE 150 MG PO TABS
150.0000 mg | ORAL_TABLET | Freq: Once | ORAL | 0 refills | Status: AC
Start: 2019-12-05 — End: 2019-12-05

## 2019-12-05 NOTE — Telephone Encounter (Signed)
Patient calls nurse line regarding fluconazole rx. Patient states that second pill fell down the sink and needs another pill sent into the pharmacy.   Forwarding to PCP   Talbot Grumbling, RN

## 2019-12-05 NOTE — Telephone Encounter (Signed)
Please inform patient RX was sent to pharmacy.

## 2019-12-05 NOTE — Chronic Care Management (AMB) (Signed)
°  Chronic Care Management   Outreach Note  12/05/2019 Name: [name] MRN: [id number] DOB: 1955-12-04  [name] is a 64 y.o. year old female who is a primary care patient of [name], DO. I reached out to [employer] by phone today in response to a referral sent by [name] PCP, Milagros Loll DO     A second unsuccessful telephone outreach was attempted today. The patient was referred to the case management team for assistance with care management and care coordination.   Follow Up Plan: A HIPPA compliant phone message was left for the patient providing contact information and requesting a return call.  The care management team will reach out to the patient again over the next 7 days.  If patient returns call to provider office, please advise to call Embedded Care Management Care Guide Glenna Durand LPN at [phone]  Celsa Nordahl, LPN Health Advisor, [place] Management ??[email or web address]  ??[phone]

## 2019-12-06 ENCOUNTER — Ambulatory Visit: Payer: Self-pay | Admitting: Licensed Clinical Social Worker

## 2019-12-06 NOTE — Telephone Encounter (Signed)
Called patient and LVM of below.   Talbot Grumbling, RN

## 2019-12-06 NOTE — Chronic Care Management (AMB) (Signed)
  Care Management  Referral Note  12/06/2019 Name: [name] MRN: [id number] DOB: 01-Oct-1955  Referred by: PCP, [place]  Follow up plan: Spoke with patient by phone. Follow up scheduled 12/06/2019 with LCSW  [name], [place] / [place]   [phone] 11:40 AM

## 2019-12-07 ENCOUNTER — Other Ambulatory Visit: Payer: Self-pay

## 2019-12-07 ENCOUNTER — Telehealth: Payer: Self-pay

## 2019-12-07 ENCOUNTER — Ambulatory Visit: Payer: Medicare HMO | Admitting: Licensed Clinical Social Worker

## 2019-12-07 DIAGNOSIS — R0602 Shortness of breath: Secondary | ICD-10-CM | POA: Diagnosis not present

## 2019-12-07 DIAGNOSIS — I517 Cardiomegaly: Secondary | ICD-10-CM | POA: Diagnosis not present

## 2019-12-07 DIAGNOSIS — E11649 Type 2 diabetes mellitus with hypoglycemia without coma: Secondary | ICD-10-CM | POA: Diagnosis not present

## 2019-12-07 DIAGNOSIS — N281 Cyst of kidney, acquired: Secondary | ICD-10-CM | POA: Diagnosis not present

## 2019-12-07 DIAGNOSIS — R109 Unspecified abdominal pain: Secondary | ICD-10-CM | POA: Diagnosis not present

## 2019-12-07 DIAGNOSIS — R4781 Slurred speech: Secondary | ICD-10-CM | POA: Diagnosis not present

## 2019-12-07 DIAGNOSIS — E1140 Type 2 diabetes mellitus with diabetic neuropathy, unspecified: Secondary | ICD-10-CM | POA: Diagnosis not present

## 2019-12-07 DIAGNOSIS — J439 Emphysema, unspecified: Secondary | ICD-10-CM | POA: Diagnosis not present

## 2019-12-07 DIAGNOSIS — I2510 Atherosclerotic heart disease of native coronary artery without angina pectoris: Secondary | ICD-10-CM | POA: Diagnosis not present

## 2019-12-07 DIAGNOSIS — N184 Chronic kidney disease, stage 4 (severe): Secondary | ICD-10-CM | POA: Diagnosis not present

## 2019-12-07 DIAGNOSIS — Z7189 Other specified counseling: Secondary | ICD-10-CM

## 2019-12-07 DIAGNOSIS — I10 Essential (primary) hypertension: Secondary | ICD-10-CM | POA: Diagnosis not present

## 2019-12-07 DIAGNOSIS — I700 Atherosclerosis of aorta: Secondary | ICD-10-CM | POA: Diagnosis not present

## 2019-12-07 DIAGNOSIS — R112 Nausea with vomiting, unspecified: Secondary | ICD-10-CM | POA: Diagnosis not present

## 2019-12-07 DIAGNOSIS — R945 Abnormal results of liver function studies: Secondary | ICD-10-CM | POA: Diagnosis not present

## 2019-12-07 DIAGNOSIS — E876 Hypokalemia: Secondary | ICD-10-CM | POA: Diagnosis not present

## 2019-12-07 DIAGNOSIS — R200 Anesthesia of skin: Secondary | ICD-10-CM | POA: Diagnosis not present

## 2019-12-07 DIAGNOSIS — R739 Hyperglycemia, unspecified: Secondary | ICD-10-CM | POA: Diagnosis not present

## 2019-12-07 DIAGNOSIS — I130 Hypertensive heart and chronic kidney disease with heart failure and stage 1 through stage 4 chronic kidney disease, or unspecified chronic kidney disease: Secondary | ICD-10-CM | POA: Diagnosis not present

## 2019-12-07 NOTE — Chronic Care Management (AMB) (Signed)
    Clinical Social Work  Care Management Outreach   12/07/2019 Name: AVALYNN BOWE MRN: [id number] DOB: Jan 21, 1956  KAIDYN HERNANDES is a 64 y.o. year old female who is a primary care patient of [name], DO .  The Care Management team was consulted for assistance with [place] and Resources  LCSW reached out to [employer] today by phone to introduce self, assess needs and offer Care Management services and interventions.   Patient unable to talk due to concerns with not feeling well. (stomach pain and shortness of breath) Intervention: Collaborated with [facility] RN ( [place]) who called to assess patient's concerns   Plan: LCSW will F/U with patient in one week  Review of patient status, including review of consultants reports, relevant laboratory and other test results, and collaboration with appropriate care team members and the patient's provider was performed as part of comprehensive patient evaluation and provision of care management services.    [name], [place] / [place]   [phone] 2:22 PM

## 2019-12-07 NOTE — Telephone Encounter (Signed)
Called patient to check in to make sure she was going to the ED as instructed.  She notes that she is waiting for her husband to get home and then he will take her to urgent care.  She endorses nausea, weakness, fatigue, abdominal pain, and general feeling of unwell. I asked patient to check her blood sugar which was noted to be 539. She notes she took all of her diabetes medications this morning.  Instructed her that she needs to go to the ED for IV hydration and insulin therapy as I am concerned that she is going into HHS/DKA. She voiced understanding and agreement with plan.  Her husband will be home within 30 minutes and they will report to the ED immediately.  I instructed her that if she started to feel worse before her husband returns from work that she should call EMS to be taken to ED immediately.  She voiced understanding agreement with plan.

## 2019-12-07 NOTE — Telephone Encounter (Signed)
Our LCSW asked me to reach out to patient due to some concerns. I called the patient and she sounded "off," soft spoken and trouble getting her words out. Patient reports she has felt bad all day. Patient reports a sudden onset of of nausea along with leg weakness and SOB starting this morning. I advised patient to go to ED ASAP to be evaluated for concerning symptoms. Patient agreed with plan and will call us with updates.

## 2019-12-08 ENCOUNTER — Ambulatory Visit: Payer: Self-pay | Admitting: Licensed Clinical Social Worker

## 2019-12-08 DIAGNOSIS — R4781 Slurred speech: Secondary | ICD-10-CM | POA: Diagnosis not present

## 2019-12-08 DIAGNOSIS — I5042 Chronic combined systolic (congestive) and diastolic (congestive) heart failure: Secondary | ICD-10-CM | POA: Diagnosis not present

## 2019-12-08 DIAGNOSIS — I10 Essential (primary) hypertension: Secondary | ICD-10-CM | POA: Diagnosis not present

## 2019-12-08 DIAGNOSIS — R0602 Shortness of breath: Secondary | ICD-10-CM | POA: Diagnosis not present

## 2019-12-08 DIAGNOSIS — R739 Hyperglycemia, unspecified: Secondary | ICD-10-CM | POA: Diagnosis not present

## 2019-12-08 DIAGNOSIS — Z794 Long term (current) use of insulin: Secondary | ICD-10-CM | POA: Diagnosis not present

## 2019-12-08 DIAGNOSIS — I428 Other cardiomyopathies: Secondary | ICD-10-CM | POA: Diagnosis not present

## 2019-12-08 DIAGNOSIS — I340 Nonrheumatic mitral (valve) insufficiency: Secondary | ICD-10-CM | POA: Diagnosis not present

## 2019-12-08 DIAGNOSIS — R109 Unspecified abdominal pain: Secondary | ICD-10-CM | POA: Diagnosis not present

## 2019-12-08 DIAGNOSIS — E119 Type 2 diabetes mellitus without complications: Secondary | ICD-10-CM | POA: Diagnosis not present

## 2019-12-08 NOTE — Chronic Care Management (AMB) (Signed)
    Clinical Social Work  Care Management   12/08/2019 Name: [name] MRN: [id number] DOB: 19-Nov-1955  [name] is a 64 y.o. year old female who is a primary care patient of [name], DO .   LCSW received phone message from NATANE HEWARD today that she is in the hospital in [place].  Asked LCSW to inform PCP Intervention: PCP notified  Plan: LCSW will F/U with patient after she is discharged.  Review of patient status, including review of consultants reports, relevant laboratory and other test results, and collaboration with appropriate care team members and the patient's provider was performed as part of comprehensive patient evaluation and provision of care management services.     [name], [place] / [place]   [phone] 2:17 PM

## 2019-12-09 ENCOUNTER — Encounter: Payer: Self-pay | Admitting: Family Medicine

## 2019-12-09 ENCOUNTER — Telehealth (INDEPENDENT_AMBULATORY_CARE_PROVIDER_SITE_OTHER): Payer: Medicare HMO | Admitting: Family Medicine

## 2019-12-09 ENCOUNTER — Other Ambulatory Visit: Payer: Self-pay

## 2019-12-09 DIAGNOSIS — E1169 Type 2 diabetes mellitus with other specified complication: Secondary | ICD-10-CM | POA: Diagnosis not present

## 2019-12-09 DIAGNOSIS — E1140 Type 2 diabetes mellitus with diabetic neuropathy, unspecified: Secondary | ICD-10-CM | POA: Diagnosis not present

## 2019-12-09 DIAGNOSIS — IMO0002 Reserved for concepts with insufficient information to code with codable children: Secondary | ICD-10-CM

## 2019-12-09 DIAGNOSIS — R0602 Shortness of breath: Secondary | ICD-10-CM | POA: Diagnosis not present

## 2019-12-09 DIAGNOSIS — E785 Hyperlipidemia, unspecified: Secondary | ICD-10-CM | POA: Diagnosis not present

## 2019-12-09 DIAGNOSIS — Z794 Long term (current) use of insulin: Secondary | ICD-10-CM | POA: Diagnosis not present

## 2019-12-09 DIAGNOSIS — R739 Hyperglycemia, unspecified: Secondary | ICD-10-CM | POA: Diagnosis not present

## 2019-12-09 DIAGNOSIS — E1165 Type 2 diabetes mellitus with hyperglycemia: Secondary | ICD-10-CM

## 2019-12-09 DIAGNOSIS — R69 Illness, unspecified: Secondary | ICD-10-CM | POA: Diagnosis not present

## 2019-12-09 DIAGNOSIS — R4781 Slurred speech: Secondary | ICD-10-CM | POA: Diagnosis not present

## 2019-12-09 DIAGNOSIS — F332 Major depressive disorder, recurrent severe without psychotic features: Secondary | ICD-10-CM | POA: Diagnosis not present

## 2019-12-09 DIAGNOSIS — R109 Unspecified abdominal pain: Secondary | ICD-10-CM | POA: Diagnosis not present

## 2019-12-09 NOTE — Assessment & Plan Note (Signed)
Patient currently hospitalized for severe hyperglycemia leading to abdominal pain, nausea/vomiting, diarrhea in setting of severe depression leading to medication noncompliance.  She had extensive work-up that was unremarkable.  She is still currently in the hospital receiving care from the hospitalist.  Appreciate their help with caring for [name].  We will plan to follow-up on 12/15/2019 to review medications and diabetes management plan.  If patient's abdominal pain does not improve following better control of her diabetes, recommended that she follow-up for further evaluation.  Patient voiced understanding and agreement with plan.

## 2019-12-09 NOTE — Assessment & Plan Note (Signed)
Uncontrolled.  Likely secondary to noncompliance in setting of severe depression.  We will further discuss at follow-up on 12/15/2019. -Continue atorvastatin 80 mg daily -May benefit from additional agent if continues to be elevated despite adherence

## 2019-12-09 NOTE — Chronic Care Management (AMB) (Signed)
  Chronic Care Management   Note  12/09/2019 Name: [name] MRN: [id number] DOB: 1955/08/31  [name] is a 64 y.o. year old female who is a primary care patient of [name], DO. I reached out to [employer] by phone today in response to a referral sent by [name] PCP, [name] DO     [name] was given information about Chronic Care Management services today including:  1. CCM service includes personalized support from designated clinical staff supervised by her physician, including individualized plan of care and coordination with other care providers 2. 24/7 contact phone numbers for assistance for urgent and routine care needs. 3. Service will only be billed when office clinical staff spend 20 minutes or more in a month to coordinate care. 4. Only one practitioner may furnish and bill the service in a calendar month. 5. The patient may stop CCM services at any time (effective at the end of the month) by phone call to the office staff. 6. The patient will be responsible for cost sharing (co-pay) of up to 20% of the service fee (after annual deductible is met).  Patient agreed to services and verbal consent obtained.   Follow up plan: Telephone appointment with care management team member scheduled for:12/14/2019  Glenna Durand, LPN Health Advisor, [place] Management ??[email or web address] ??[phone]

## 2019-12-09 NOTE — Progress Notes (Signed)
[place] Telemedicine Visit  I connected with [name] on 12/09/19 at  8:30 AM EDT by a video enabled telemedicine application and verified that I am speaking with the correct person using two identifiers.     I discussed the limitations of evaluation and management by telemedicine and the availability of in person appointments.  I discussed that the purpose of this telehealth visit is to provide medical care while limiting exposure to the novel coronavirus.  The patient expressed understanding and agreed to proceed.  Patient consented to have virtual visit. Method of visit: Video  Encounter participants: Patient: [name] - located at  Provider: [name] - located at [facility] Others (if applicable): [name], MS2  Chief Complaint: Diabetes and Depression Follow up  HPI:  Depression: She has not been able to follow up with psychiatrist or establish care due to the acute illness.  She notes that she continues to be very tearful and crying over everything.  Denies SI/HI.  Diabetes: Patient hospitalized for hyperglycemia with associated abdominal pain, nausea, vomiting, increased stool frequency in setting of difficult social situation and depression. She had CT head which was negative. CT abdomen/pelvic with and without contrast negative for acute findings. RUQ ultrasound negative for cholelithiasis or acute process. MRI head negative for acute abnormality. She notes that for the last few weeks she has not been taking her medications correctly   ROS: per HPI  Pertinent PMHx: Uncontrolled type 2 diabetes, severe MDD/bipolar disorder  Exam:  Gen: Intermittently tearful throughout exam Respiratory: Breathing comfortably on room air, speaking in full sentences Neuro: Denies SI/HI  Assessment/Plan:  Uncontrolled type 2 diabetes mellitus with diabetic neuropathy, with long-term current use of insulin ([name]) Patient currently hospitalized  for severe hyperglycemia leading to abdominal pain, nausea/vomiting, diarrhea in setting of severe depression leading to medication noncompliance.  She had extensive work-up that was unremarkable.  She is still currently in the hospital receiving care from the hospitalist.  Appreciate their help with caring for [name].  We will plan to follow-up on 12/15/2019 to review medications and diabetes management plan.  If patient's abdominal pain does not improve following better control of her diabetes, recommended that she follow-up for further evaluation.  Patient voiced understanding and agreement with plan.  Severe episode of recurrent major depressive disorder, without psychotic features ([place]) History of severe major depression disorder and bipolar 2 disorder with history of treatment with multiple medications.  She has been unable to establish care with psychiatry given acute illness from her hyperglycemia.  Patient instructed to call psychiatrist on Monday to establish care.  [name] also plans to reach out to patient to help with resources.  Patient currently denies SI/HI but was very tearful throughout exam. Safety plan discussed with the patient. They are aware of how to contact crisis services if need be and instructed to go to the nearest emergency room if they feel they are in imminent danger of harming themselves and or others, or symptoms are of out control or unbearable.  Follow-up scheduled for 12/15/2019, sooner if worsening symptoms.   Hyperlipidemia due to type 2 diabetes mellitus (HCC) Uncontrolled.  Likely secondary to noncompliance in setting of severe depression.  We will further discuss at follow-up on 12/15/2019. -Continue atorvastatin 80 mg daily -May benefit from additional agent if continues to be elevated despite adherence   I discussed the assessment and treatment plan with the patient and/or parent/guardian. They were provided an  opportunity to ask questions and all were  answered. They agreed with the plan and demonstrated an understanding of the instructions.   They were advised to call back or seek an in-person evaluation in the emergency room if the symptoms worsen or if the condition fails to improve as anticipated.  Total time: 25 minutes. This includes time spent with the patient during the visit as well as time spent before and after the visit reviewing the chart, documenting the encounter, making phone calls, reviewing studies, etc.  [name], [place], PGY2 12/09/2019 10:43 AM

## 2019-12-09 NOTE — Assessment & Plan Note (Signed)
History of severe major depression disorder and bipolar 2 disorder with history of treatment with multiple medications.  She has been unable to establish care with psychiatry given acute illness from her hyperglycemia.  Patient instructed to call psychiatrist on Monday to establish care.  [name] also plans to reach out to patient to help with resources.  Patient currently denies SI/HI but was very tearful throughout exam. Safety plan discussed with the patient. They are aware of how to contact crisis services if need be and instructed to go to the nearest emergency room if they feel they are in imminent danger of harming themselves and or others, or symptoms are of out control or unbearable.  Follow-up scheduled for 12/15/2019, sooner if worsening symptoms.

## 2019-12-12 ENCOUNTER — Telehealth: Payer: Self-pay | Admitting: Family Medicine

## 2019-12-12 NOTE — Telephone Encounter (Signed)
Patient is retuning a missed call. She said she was expecting a call from Dr. Tarry Kos and would like to know if she could give her another call.

## 2019-12-13 ENCOUNTER — Ambulatory Visit: Payer: Self-pay | Admitting: Licensed Clinical Social Worker

## 2019-12-13 ENCOUNTER — Other Ambulatory Visit: Payer: Self-pay

## 2019-12-13 DIAGNOSIS — Z7189 Other specified counseling: Secondary | ICD-10-CM

## 2019-12-13 DIAGNOSIS — IMO0002 Reserved for concepts with insufficient information to code with codable children: Secondary | ICD-10-CM

## 2019-12-13 DIAGNOSIS — R69 Illness, unspecified: Secondary | ICD-10-CM | POA: Diagnosis not present

## 2019-12-13 MED ORDER — ONETOUCH ULTRA BLUE VI STRP: ORAL_STRIP | 12 refills | Status: AC

## 2019-12-13 NOTE — Chronic Care Management (AMB) (Signed)
    Clinical Social Work  Care Management Outreach   12/13/2019 Name: [name] MRN: [id number] DOB: 03-20-1956  [name] is a 64 y.o. year old female who is a primary care patient of [name], DO .  The Care Management team was consulted for assistance with Mental Health Counseling and Resources and managing chronic medical conditions.  LCSW reached out to [employer] today by phone.  The outreach was unsuccessful.  A HIPPA compliant phone message was left for the patient providing contact information and requesting a return call.  Intervention: Collaborated with CCM RN. Reminded patient of CCM RN phone appointment 12/14/2019 Plan: Will F/U with patient in 1 to 2 days if no return call.  Review of patient status, including review of consultants reports, relevant laboratory and other test results, and collaboration with appropriate care team members and the patient's provider was performed as part of comprehensive patient evaluation and provision of care management services.    [name], [place] / [place]   [phone] 4:16 PM

## 2019-12-14 ENCOUNTER — Other Ambulatory Visit: Payer: Self-pay

## 2019-12-14 ENCOUNTER — Ambulatory Visit: Payer: Medicare HMO

## 2019-12-14 NOTE — Chronic Care Management (AMB) (Signed)
  Care Management   Outreach Note  12/14/2019 Name: [name] MRN: [id number] DOB: March 07, 1956  Referred by: [name], DO Reason for referral : Care Coordination (Care Management RNCM Initial)   An unsuccessful telephone outreach was attempted today. The patient was referred to the case management team for assistance with care management and care coordination.   Follow Up Plan: A HIPPA compliant phone message was left for the patient providing contact information and requesting a return call.  The care management team will reach out to the patient again over the next 7-14 days.   Lazaro Arms RN, BSN, [facility] Care Management Coordinator [place] Phone: [phone] Fax: [phone]

## 2019-12-15 ENCOUNTER — Ambulatory Visit: Payer: Medicare HMO | Admitting: Licensed Clinical Social Worker

## 2019-12-15 ENCOUNTER — Telehealth (INDEPENDENT_AMBULATORY_CARE_PROVIDER_SITE_OTHER): Payer: Medicare HMO | Admitting: Family Medicine

## 2019-12-15 DIAGNOSIS — Z139 Encounter for screening, unspecified: Secondary | ICD-10-CM

## 2019-12-15 DIAGNOSIS — Z7189 Other specified counseling: Secondary | ICD-10-CM

## 2019-12-15 NOTE — Chronic Care Management (AMB) (Signed)
Care Management   Clinical Social Work Follow Up   12/15/2019 Name: [name] MRN: [id number] DOB: [birth year] Referred by: Mercedes Hefty, DO  Reason for referral : Care Coordination (counseling and psych)  [name] is a 64 y.o. year old female who is a primary care patient of Mercedes Hefty, DO.  Reason for follow-up: assess for barriers and progress with connecting to psychiatry and counseling .   Assessment: Patient continues to experience symptoms of depression. She has not been able to call resources provided by PCP. Assisted patient with connecting to mental health provider during this encounter. Also mailed patient [facility] calendar to keep up with appointment. Plan:  1. Patient will schedule appointment when [facility] returns her call 2.   Reminded patient of schedule phone appointment with CCM RN 3. LCSW will F/U with patient in 2 weeks Advance Directive Status: [place]  for related entries.  SDOH (Social Determinants of Health) assessments performed: Yes ;  SDOH Interventions     Most Recent Value  SDOH Interventions  SDOH Interventions for the Following Domains Depression  Depression Interventions/Treatment  Referral to Psychiatry, Counseling      Goals Addressed            This Visit's Progress   . Advance Directives       CARE PLAN ENTRY (see longitudinal plan of care for additional care plan information)  Current Barriers:  . Patient does not have an Forensic scientist . Patient acknowledges deficits, education and support in order to complete this document Clinical Social Work Goal(s): Over the next 30 to 60 days,  . the patient will review and complete Advance Directive packet, have notarized and provide a copy to provider office . review mailed EMMI education on Advance Directive as evidenced by patient self report of review Interventions provided by LCSW: . Assessed understanding of Advance Directives . A voluntary discussion about  advanced care planning including importance of advanced directives, healthcare proxy and living will was discussed with the patient.  . Mailed the patient EMMI educational information on Advance Directives as well as an Forensic scientist packet Patient Self Care Activities:  . Is able to complete documentation independently . Able to identify [place] [facility] [place] . patient will review information mailed by LCSW Initial goal documentation    . counseling and psychiatry       CARE PLAN ENTRY (see longitudinal plan of care for additional care plan information)  Current Barriers:  . Patient with Depression acknowledges deficits with connecting to mental health provider for medication management and ongoing counseling.   . Patient needs Support, Education, and Care Coordination in order to meet unmet mental health needs  Clinical Social Work Goal(s):  [name] Over the next 30 days, patient will work with SW by telephone to reduce or manage symptoms of depression until connected for ongoing counseling resources.  . Patient will implement clinical interventions discussed today to decreases symptoms of depression and increase knowledge and/or ability of: coping skills. Interventions:  . Assessed patient's  previous treatment, needs and barriers to care . Provided basic mental health support, education and interventions  . Collaborated with PCP and CCM RN care manager regarding patient needs . Discussed several options for long term counseling based on need and insurance. Assisted patient with narrowing the options down to [employer]  ) . Assisted patient with making call to schedule initial appointment.  Intake worker will call  patient back with in 24 hours to schedule appointment  . Other interventions include: Emotional/Supportive Counseling EMMI educational video on depression Patient Self Care Activities & Deficits:  . Patient is unable to independently navigate  community resource options without care coordination support . Patient is able to implement clinical interventions discussed today and is motivated for treatment  Initial goal documentation       Outpatient Encounter Medications as of 12/15/2019  Medication Sig  . acetaminophen (TYLENOL) 500 MG tablet Take 1,000 mg by mouth every 6 (six) hours as needed for mild pain.  . Amino Acids-Protein Hydrolys (FEEDING SUPPLEMENT, PRO-STAT SUGAR FREE 64,) LIQD Take 30 mLs by mouth 3 (three) times daily with meals.  [name] atorvastatin (LIPITOR) 80 MG tablet Take 1 tablet (80 mg total) by mouth every morning.  . Blood Glucose Monitoring Suppl (ONE TOUCH ULTRA 2) W/DEVICE KIT 1 kit by Does not apply route once.  . candesartan (ATACAND) 4 MG tablet Take 1 tablet by mouth once daily  . cyclobenzaprine (FLEXERIL) 10 MG tablet Take 1 tablet (10 mg total) by mouth 3 (three) times daily as needed for muscle spasms.  . dapagliflozin propanediol (FARXIGA) 10 MG TABS tablet Take 10 mg by mouth daily.  . feeding supplement, ENSURE ENLIVE, (ENSURE ENLIVE) LIQD Take 237 mLs by mouth 3 (three) times daily between meals.  . gabapentin (NEURONTIN) 100 MG capsule Take 1 capsule (100 mg total) by mouth 3 (three) times daily.  [name] glucose blood (ONE TOUCH ULTRA TEST) test strip USE 1 STRIP TO CHECK GLUCOSE THREE TIMES DAILY  . insulin aspart (NOVOLOG) 100 UNIT/ML injection Inject 4-8 Units into the skin 3 (three) times daily with meals.  . insulin degludec (TRESIBA FLEXTOUCH) 100 UNIT/ML SOPN FlexTouch Pen Inject 0.4 mLs (40 Units total) into the skin daily.  . Insulin Pen Needle 33G X 4 MM MISC 1 Units by Does not apply route 5 (five) times daily.  . Lancets (ONETOUCH ULTRASOFT) lancets Use as instructed  . metoprolol succinate (TOPROL-XL) 50 MG 24 hr tablet Take 50 mg by mouth daily. Take with or immediately following a meal.  . mirtazapine (REMERON) 15 MG tablet Take 1 tablet (15 mg total) by mouth at bedtime.  . ONE TOUCH  LANCETS MISC 1 Device by Does not apply route 3 (three) times daily.  [name] torsemide (DEMADEX) 20 MG tablet Take 1 tablet (20 mg total) by mouth 2 (two) times daily.   No facility-administered encounter medications on file as of 12/15/2019.    Review of patient status, including review of consultants reports, relevant laboratory and other test results, and collaboration with appropriate care team members and the patient's provider was performed as part of comprehensive patient evaluation and provision of care management services.    [name], [place] / [place]   [phone] 2:42 PM

## 2019-12-16 NOTE — Progress Notes (Signed)
Called patient to gather info prior to provider for a virtual visit.   Patient was under the impression that her appointment was another day.  Patient rescheduled for Access to Care [facility] 12/20/2019.   [name], [place]

## 2019-12-20 ENCOUNTER — Other Ambulatory Visit: Payer: Self-pay

## 2019-12-20 ENCOUNTER — Telehealth (INDEPENDENT_AMBULATORY_CARE_PROVIDER_SITE_OTHER): Payer: Medicare HMO | Admitting: Family Medicine

## 2019-12-20 DIAGNOSIS — I10 Essential (primary) hypertension: Secondary | ICD-10-CM | POA: Diagnosis not present

## 2019-12-20 DIAGNOSIS — E1165 Type 2 diabetes mellitus with hyperglycemia: Secondary | ICD-10-CM | POA: Diagnosis not present

## 2019-12-20 DIAGNOSIS — E1140 Type 2 diabetes mellitus with diabetic neuropathy, unspecified: Secondary | ICD-10-CM | POA: Diagnosis not present

## 2019-12-20 DIAGNOSIS — F332 Major depressive disorder, recurrent severe without psychotic features: Secondary | ICD-10-CM | POA: Diagnosis not present

## 2019-12-20 DIAGNOSIS — IMO0002 Reserved for concepts with insufficient information to code with codable children: Secondary | ICD-10-CM

## 2019-12-20 DIAGNOSIS — Z794 Long term (current) use of insulin: Secondary | ICD-10-CM | POA: Diagnosis not present

## 2019-12-20 DIAGNOSIS — R69 Illness, unspecified: Secondary | ICD-10-CM | POA: Diagnosis not present

## 2019-12-20 MED ORDER — CANDESARTAN CILEXETIL 4 MG PO TABS: 4.0000 mg | ORAL_TABLET | Freq: Every day | ORAL | 0 refills | Status: AC

## 2019-12-20 NOTE — Assessment & Plan Note (Signed)
Patient has been on candesartan 4 mg daily.  She has been tolerating this well.  She is requesting a refill today.  Refilled.  Patient to come in in 1 week, can check BP at that time.  No new chest pain or shortness of breath.

## 2019-12-20 NOTE — Assessment & Plan Note (Signed)
Patient continues to have depressed mood.  No SI/HI.  Her PCP has set her up with CCM and to work on getting a psychiatry appointment.  She states that they are supposed to call her.  Currently denies SI/HI.  Patient has follow-up in 1 week with PCP.

## 2019-12-20 NOTE — Progress Notes (Signed)
[place] Telemedicine Visit  Patient consented to have virtual visit and was identified by name and date of birth. Method of visit: Telephone  Encounter participants: Patient: [name] - located at home Provider: Cleophas Dunker - located at [facility] Others (if applicable): none  Chief Complaint: Diabetes f/u and Depression f/u  HPI:  Diabetes States that she is eating better and compliant with medications now since being out of the hospital Current regimen: Novolog 4-8 units TID if she eats, Tyler Aas 38u QD, Farxiga 10mg  CBG in 400s last week, accidentally took extra [place] Her sugars were low (in 40s), so didn't take more insulin until 7/5 CBG yesterday AM 52, at 3:37PM was 286 On 7/4 was 175 in AM, evening was 237 Takes her Tresiba around 9-10 AM She states that she has been trying to eat 3 meals, but doesn't always eat in the morning No polyuria or polydipsia  Hypertension Has been on candesartan 25mg  since 07/2018 Took last pill yesterday Breathing has been okay, no chest pain  Depression Mood is on and off Has been working with CCM to get an appointment with [place] with [name], LCSW on 7/1 No SI/HI Continues to have increased tearfulness and crying over everything  ROS: per HPI  Pertinent PMHx: Bipolar disorder, CHF, hypertension, nonischemic cardiomyopathy, T2DM, HLD  Exam:  There were no vitals taken for this visit.  Respiratory: Breathing comfortably on room air, no evidence of respiratory distress over the phone, speaking complete sentences  Assessment/Plan:  Uncontrolled type 2 diabetes mellitus with diabetic neuropathy, with long-term current use of insulin ([place]) CBGs currently not well controlled.  Did have some lows with overtaking [place].  Seems that she has difficulty eating 3 meals a day, mostly her levels are elevated in the evenings.  She takes [place] in the morning.  Will increase Tresiba from  38 units to 40 units.  We will continue with current sliding scale of NovoLog.  We will also continue with [place].  She is currently on an ARB.  This was refilled.  Advised her to follow-up in 1 week in person.  Call back if problems with sugars running too high or too low.  Primary hypertension Patient has been on candesartan 4 mg daily.  She has been tolerating this well.  She is requesting a refill today.  Refilled.  Patient to come in in 1 week, can check BP at that time.  No new chest pain or shortness of breath.  Severe episode of recurrent major depressive disorder, without psychotic features ([place]) Patient continues to have depressed mood.  No SI/HI.  Her PCP has set her up with CCM and to work on getting a psychiatry appointment.  She states that they are supposed to call her.  Currently denies SI/HI.  Patient has follow-up in 1 week with PCP.    Time spent during visit with patient: 12 minutes

## 2019-12-20 NOTE — Assessment & Plan Note (Signed)
CBGs currently not well controlled.  Did have some lows with overtaking [place].  Seems that she has difficulty eating 3 meals a day, mostly her levels are elevated in the evenings.  She takes [place] in the morning.  Will increase Tresiba from 38 units to 40 units.  We will continue with current sliding scale of NovoLog.  We will also continue with [place].  She is currently on an ARB.  This was refilled.  Advised her to follow-up in 1 week in person.  Call back if problems with sugars running too high or too low.

## 2019-12-27 ENCOUNTER — Other Ambulatory Visit: Payer: Self-pay

## 2019-12-27 ENCOUNTER — Ambulatory Visit: Payer: Medicare HMO

## 2019-12-27 NOTE — Chronic Care Management (AMB) (Signed)
  Care Management   Outreach Note  12/27/2019 Name: [name] MRN: [id number] DOB: [birth year]  Referred by: [name], DO Reason for referral : Chronic Care Management (Initial DM II 2nd atteempt)   A second unsuccessful telephone outreach was attempted today. The patient was referred to the case management team for assistance with care management and care coordination.   Follow Up Plan: A HIPPA compliant phone message was left for the patient providing contact information and requesting a return call.  The care management team will reach out to the patient again over the next 7-14 days.   Lazaro Arms RN, BSN, [facility] Care Management Coordinator [place] Phone: [phone] Fax: [phone]

## 2019-12-28 ENCOUNTER — Ambulatory Visit: Payer: Medicare HMO | Admitting: Family Medicine

## 2019-12-28 NOTE — Progress Notes (Deleted)
° °  Subjective:   Patient ID: [name]    DOB: 10/27/1955, 64 y.o. female   MRN: [id number]  [name] is a 64 y.o. female with a history of *** here for ***  Uncontrolled Diabetes: Last three A1C's below. Currently on Farxiga 10mg  QD, Tresiba 40U QD, Novolog 4-8U TID. Endorses compliance. Notes CBGs range ***. Denies any hypoglycemia. Denies any polyuria, polydipsia, polyphagia. Due for ***.  Lab Results  Component Value Date   HGBA1C 12.9 (A) 12/01/2019   HGBA1C 10.1 (A) 08/08/2019   HGBA1C 10.9 (A) 02/28/2019   HTN:    today. Currently on Candesartan 4mg  QD. Endorses compliance. Non-smoker/Current everyday smoker***. Denies any chest pain, SOB, vision changes, or headaches.   HLD: Last lipid panel below. Currently on Atorvastatin 80mg  QD. Has not been taking medications regularly. Denies any muscles aches or weakness.  Lab Results  Component Value Date   CHOL 304 (H) 12/01/2019   HDL 64 12/01/2019   LDLCALC 209 (H) 12/01/2019   LDLDIRECT 237 (H) 06/25/2017   TRIG 164 (H) 12/01/2019   CHOLHDL 4.8 (H) 12/01/2019    Depression:    Stomach Pain:  Review of Systems:  Per HPI.   Objective:   There were no vitals taken for this visit. Vitals and nursing note reviewed.  General: well nourished, well developed, in no acute distress with non-toxic appearance HEENT: normocephalic, atraumatic, moist mucous membranes Neck: supple, non-tender without lymphadenopathy CV: regular rate and rhythm without murmurs, rubs, or gallops, no lower extremity edema Lungs: clear to auscultation bilaterally with normal work of breathing Abdomen: soft, non-tender, non-distended, no masses or organomegaly palpable, normoactive bowel sounds Skin: warm, dry, no rashes or lesions Extremities: warm and well perfused, normal tone MSK: ROM grossly intact, strength intact, gait normal Neuro: Alert and oriented, speech normal  Assessment & Plan:   No problem-specific Assessment &  Plan notes found for this encounter.  No orders of the defined types were placed in this encounter.  No orders of the defined types were placed in this encounter.   (1). Assess statin adherence. If adherent, initiate ezetimibe 10 mg daily.  (2). F/u Direct LDL in 3 months to assess for medication efficacy      [name], DO PGY-3, [place] [facility] 12/28/2019 7:57 AM

## 2020-01-03 ENCOUNTER — Ambulatory Visit (INDEPENDENT_AMBULATORY_CARE_PROVIDER_SITE_OTHER): Payer: Medicare HMO | Admitting: Family Medicine

## 2020-01-03 ENCOUNTER — Ambulatory Visit: Payer: Medicare HMO | Admitting: Licensed Clinical Social Worker

## 2020-01-03 ENCOUNTER — Other Ambulatory Visit: Payer: Self-pay

## 2020-01-03 VITALS — BP 142/80 | HR 95 | Ht 62.0 in | Wt 124.6 lb

## 2020-01-03 DIAGNOSIS — E785 Hyperlipidemia, unspecified: Secondary | ICD-10-CM

## 2020-01-03 DIAGNOSIS — I10 Essential (primary) hypertension: Secondary | ICD-10-CM

## 2020-01-03 DIAGNOSIS — Z794 Long term (current) use of insulin: Secondary | ICD-10-CM | POA: Diagnosis not present

## 2020-01-03 DIAGNOSIS — Z87891 Personal history of nicotine dependence: Secondary | ICD-10-CM

## 2020-01-03 DIAGNOSIS — Z7189 Other specified counseling: Secondary | ICD-10-CM

## 2020-01-03 DIAGNOSIS — IMO0002 Reserved for concepts with insufficient information to code with codable children: Secondary | ICD-10-CM

## 2020-01-03 DIAGNOSIS — E1169 Type 2 diabetes mellitus with other specified complication: Secondary | ICD-10-CM | POA: Diagnosis not present

## 2020-01-03 DIAGNOSIS — E1165 Type 2 diabetes mellitus with hyperglycemia: Secondary | ICD-10-CM | POA: Diagnosis not present

## 2020-01-03 DIAGNOSIS — E1140 Type 2 diabetes mellitus with diabetic neuropathy, unspecified: Secondary | ICD-10-CM

## 2020-01-03 DIAGNOSIS — F332 Major depressive disorder, recurrent severe without psychotic features: Secondary | ICD-10-CM

## 2020-01-03 DIAGNOSIS — R69 Illness, unspecified: Secondary | ICD-10-CM | POA: Diagnosis not present

## 2020-01-03 MED ORDER — LIRAGLUTIDE 18 MG/3ML ~~LOC~~ SOPN: 0.6000 mg | PEN_INJECTOR | Freq: Every morning | SUBCUTANEOUS | 1 refills | Status: AC

## 2020-01-03 MED ORDER — TRESIBA FLEXTOUCH 100 UNIT/ML ~~LOC~~ SOPN: 32.0000 [IU] | PEN_INJECTOR | Freq: Every day | SUBCUTANEOUS | 2 refills | Status: DC

## 2020-01-03 NOTE — Progress Notes (Signed)
Subjective:   Patient ID: [name]    DOB: 12-Jun-1956, 64 y.o. female   MRN: [id number]  Mahala LASHONNA RIEKE is a 64 y.o. female with a history of essential hypertension, combined systolic and diastolic CHF, nonischemic cardiomyopathy, valvular heart disease, COPD with emphysema, GERD, peripheral neuropathy, hyperlipidemia, uncontrolled diabetes, history of tobacco abuse, fibromyalgia, severe major depression here for diabetes follow-up.  Diabetes: Last three A1C's below. Currently on Tresiba 40U qAM, NOvolog sliding scale, and [place]. Endorses compliance. Notes CBGs range 100-350, Highs are in the 400's often when she forgets to take her [place]. Lows in the 40-50's often in the morning. She has a low of 25 yesterday requiring EMS. Due for diabetic eye exam.  Lab Results  Component Value Date   HGBA1C 12.9 (A) 12/01/2019   HGBA1C 10.1 (A) 08/08/2019   HGBA1C 10.9 (A) 02/28/2019    HTN:  BP: (!) 142/80 today. Currently on Candesartan 4mg  QD. Endorses compliance but did not take medicine today. Prior smoker. Denies any chest pain, SOB, vision changes, or headaches.   HLD: Last lipid panel below. Currently on Atorvastatin 80mg . Has not been compliant in setting of worsening depression. Denies any muscles aches or weakness.  Lab Results  Component Value Date   CHOL 304 (H) 12/01/2019   HDL 64 12/01/2019   LDLCALC 209 (H) 12/01/2019   LDLDIRECT 237 (H) 06/25/2017   TRIG 164 (H) 12/01/2019   CHOLHDL 4.8 (H) 12/01/2019   Severe Major Depression: Patient has been working with social worker [name] in establishing care with psychiatrist for counseling and medication assistance.  PHQ-9 score 15 today with score of 1 to question #9. This is improved from a score of 21 in June. Denies any active SI/HI. Notes she is doing better today.  She notes that she has her good days and her bad days but is been trying to stay busy.  She has been working very hard on a family members  graduation party which has been keeping her occupied.  [name] was present during exam and helped coordinate appointment with [facility].   Review of Systems:  Per HPI.   Objective:   BP (!) 142/80   Pulse 95   Ht 5\' 2"  (1.575 m)   Wt 124 lb 9.6 oz (56.5 kg)   SpO2 97%   BMI 22.79 kg/m  Vitals and nursing note reviewed.  General: Pleasant older female, sitting comfortably in exam bed, well nourished, well developed, in no acute distress with non-toxic appearance Resp: Breathing comfortably on room air, speaking in full sentences Skin: warm, dry Extremities: warm and well perfused MSK:  gait normal Neuro: Alert and oriented, speech normal Psych: Conversant and pleasant. Appears more happy today. Not tearful during exam. Not anxious or manic appearing.  Linear and non-tangential thought process noted  Depression screen [facility] 2/9 01/04/2020 12/09/2019 12/01/2019  Decreased Interest 1 3 3   Down, Depressed, Hopeless 1 3 3   PHQ - 2 Score 2 6 6   Altered sleeping 2 3 3   Tired, decreased energy 3 3 3   Change in appetite 1 0 3  Feeling bad or failure about yourself  1 3 3   Trouble concentrating 2 3 3   Moving slowly or fidgety/restless 3 2 0  Suicidal thoughts 1 1 1   PHQ-9 Score 15 21 22   Difficult doing work/chores Somewhat difficult Somewhat difficult Extremely dIfficult  Some recent data might be hidden   Assessment & Plan:   Primary hypertension  Elevated blood pressure today in setting of missed blood pressure medicine this morning.  She plans to take her medicine when she gets home.   -Continue candesartan 4 mg daily  Uncontrolled type 2 diabetes mellitus with diabetic neuropathy, with long-term current use of insulin (HCC) Chronic, uncontrolled.  Patient has very labile blood sugars ranging from 25 to 450 with significant fluctuations throughout the day. She is chronic insulin user, checking blood sugars >4 times per day.  She would benefit  greatly from continuous glucose monitoring.  Will send orders in for this.  Will adjust medications to better control and help prevent hypoglycemia. -Decrease Tresiba to 32 units every morning -Continue Farxiga -Continue NovoLog sliding scale 4 to 8 units with meals -Start liraglutide 0.6 mg QD -Follow-up in 2 weeks to review blood sugar log and medication adjustment if indicated  Hyperlipidemia due to type 2 diabetes mellitus (HCC) Chronic, uncontrolled.  Patient endorses better compliance with atorvastatin. -New refill sent to pharmacy  History of tobacco abuse Prior smoker, quit in February 2020.  Congratulated patient on success. -Due for lung cancer screen.  - Will discuss at follow-up visit to allow patient time to obtain better control of her diabetes and depression.  Severe episode of recurrent major depressive disorder, without psychotic features ([place]) Patient continues to endorse depressed mood but appears improved from prior exam.  PHQ-9 score improved from 21-15 today.  Denies any active SI/HI.  Social worker [name] present during exam who helped extensively to establish patient with [facility] and [facility].  Appointment scheduled for 01/13/2020 at 11 AM.  Patient is aware and plans to attend this appointment. Safety plan discussed with the patient. They are aware of how to contact crisis services if need be and instructed to go to the nearest emergency room if they feel they are in imminent danger of harming themselves and or others, or symptoms are of out control or unbearable.  - follow up with [facility] on 01/13/20 - follow up with PCP in 2 weeks via telemedicine appointment  Orders Placed This Encounter  Procedures  . Referral to Nutrition and Diabetes Services    Referral Priority:   Routine    Referral Type:   Consultation    Referral Reason:   Specialty Services Required    Number of Visits Requested:   1   Meds ordered this encounter    Medications  . liraglutide (VICTOZA) 18 MG/3ML SOPN    Sig: Inject 0.1 mLs (0.6 mg total) into the skin in the morning.    Dispense:  3 mL    Refill:  1  . insulin degludec (TRESIBA FLEXTOUCH) 100 UNIT/ML FlexTouch Pen    Sig: Inject 0.32 mLs (32 Units total) into the skin daily.    Dispense:  15 mL    Refill:  2  . atorvastatin (LIPITOR) 80 MG tablet    Sig: Take 1 tablet (80 mg total) by mouth every morning.    Dispense:  90 tablet    Refill:  2  . Continuous Blood Gluc Receiver (DEXCOM G6 RECEIVER) DEVI    Sig: 1 Device by Does not apply route continuous.    Dispense:  1 each    Refill:  2  . Continuous Blood Gluc Sensor (DEXCOM G6 SENSOR) MISC    Sig: 1 Device by Does not apply route continuous.    Dispense:  3 each    Refill:  3  . Continuous Blood Gluc Transmit (DEXCOM G6 TRANSMITTER) MISC  Sig: 1 Device by Does not apply route continuous.    Dispense:  1 each    Refill:  Doran, DO PGY-3, [facility] 01/04/2020 12:12 PM

## 2020-01-03 NOTE — Patient Instructions (Signed)
Thank you for coming to see me today. It was a pleasure to see you.   Diabetes: Continue [place] daily. Decrease the Tresiba to 32 units every morning Start Victoza 0.6mg  injection daily I will work on getting you a continuous glucose monitor to better monitor your blood sugars Follow up with Dr. Tarry Kos via telemedicine for diabetes follow up.  For your heart failure, be sure to take Torsemide twice a day. If you continue to have swelling or any worsening in breathing, follow up with me or the cardiologist.   Please follow up with psychiatry on 01/13/20 at 11am and August 9th as scheduled.   If you have any questions or concerns, please do not hesitate to call the office at [phone].  Take Care,  Dr. [name], DO Resident Physician [place] [phone]

## 2020-01-04 ENCOUNTER — Telehealth: Payer: Self-pay

## 2020-01-04 MED ORDER — DEXCOM G6 SENSOR MISC: 1.0000 | 3 refills | Status: DC

## 2020-01-04 MED ORDER — DEXCOM G6 TRANSMITTER MISC: 1.0000 | 3 refills | Status: AC

## 2020-01-04 MED ORDER — DEXCOM G6 RECEIVER DEVI: 1.0000 | 2 refills | Status: AC

## 2020-01-04 MED ORDER — ATORVASTATIN CALCIUM 80 MG PO TABS: 80.0000 mg | ORAL_TABLET | Freq: Every morning | ORAL | 2 refills | Status: AC

## 2020-01-04 NOTE — Assessment & Plan Note (Signed)
Chronic, uncontrolled.  Patient endorses better compliance with atorvastatin. -New refill sent to pharmacy

## 2020-01-04 NOTE — Assessment & Plan Note (Signed)
Prior smoker, quit in February 2020.  Congratulated patient on success. -Due for lung cancer screen.  - Will discuss at follow-up visit to allow patient time to obtain better control of her diabetes and depression.

## 2020-01-04 NOTE — Assessment & Plan Note (Signed)
Elevated blood pressure today in setting of missed blood pressure medicine this morning.  She plans to take her medicine when she gets home.   -Continue candesartan 4 mg daily

## 2020-01-04 NOTE — Chronic Care Management (AMB) (Signed)
Care Management   Clinical Social Work Follow Up   01/04/2020 Name: [name] MRN: [id number] DOB: Dec 13, 1955 Referred by: [name], DO  Reason for referral : Care Coordination (mental health needs)  [name] is a 64 y.o. year old female who is a primary care patient of [name], DO.  Reason for follow-up: assess for barriers and progress with connecting with counseling and psychiatry .   Assessment: Patient continues to experience barriers and difficulty with follow up which seems to be exacerbated by symptom of depression.  This is also evident by patient not answering her phone.   Patient would like continued follow-up from CCM LCSW. Plan:  1. LCSW will reachout to patient within the next week 2.  CCM RN will make another attempt to contact patient next week Advance Directive Status:; not addressed during this encounter.  SDOH (Social Determinants of Health) assessments performed: Yes ;   Goals Addressed            This Visit's Progress   . counseling and psychiatry   On track    [place] (see longitudinal plan of care for additional care plan information)  Current Barriers & Progress:  . Patient with Depression acknowledges deficits with connecting to mental health provider for medication management and ongoing counseling.   . Patient needs Support, Education, and Care Coordination in order to meet unmet mental health needs  . Patient has not made progress with connecting to mental health provider; [facility] called patient however she did not return call Clinical Social Work Goal(s):  [name] Over the next 30 days, patient will work with SW by telephone to reduce or manage symptoms of depression until connected for ongoing counseling resources.  . Patient will implement clinical interventions discussed today to decreases symptoms of depression and increase knowledge and/or ability of: coping skills. Interventions:  . Assessed patient's  needs and barriers to care . Collaborated with PCP and CCM RN care manager regarding patient needs ( reminded patient that CCM RN would call her on July 29th  . Assisted patient with calling [facility] [phone] while in office to schedule initial appointment.  Intake worker  previously called patient back however patient did not answer the phone. [name] Appointment scheduled July 30th at 11:00 with [facility]( patient will meet with therapist first then medication management appointment in Aug. ) . Other interventions include: Emotional/Supportive Counseling, provided patient with CCM calendar to keep up with appointments  Patient Self Care Activities & Deficits:  . Patient is unable to independently navigate community resource options without care coordination support . Patient is motivated for treatment and will keep appointment July 30th  Please see past updates related to this goal by clicking on the "Past Updates" button in the selected goal        Outpatient Encounter Medications as of 01/03/2020  Medication Sig  . acetaminophen (TYLENOL) 500 MG tablet Take 1,000 mg by mouth every 6 (six) hours as needed for mild pain.  . Amino Acids-Protein Hydrolys (FEEDING SUPPLEMENT, PRO-STAT SUGAR FREE 64,) LIQD Take 30 mLs by mouth 3 (three) times daily with meals.  [name] atorvastatin (LIPITOR) 80 MG tablet Take 1 tablet (80 mg total) by mouth every morning.  . Blood Glucose Monitoring Suppl (ONE TOUCH ULTRA 2) W/DEVICE KIT 1 kit by Does not apply route once.  . candesartan (ATACAND) 4 MG tablet Take 1 tablet (4 mg total) by mouth daily.  . cyclobenzaprine (FLEXERIL) 10 MG  tablet Take 1 tablet (10 mg total) by mouth 3 (three) times daily as needed for muscle spasms.  . dapagliflozin propanediol (FARXIGA) 10 MG TABS tablet Take 10 mg by mouth daily.  . feeding supplement, ENSURE ENLIVE, (ENSURE ENLIVE) LIQD Take 237 mLs by mouth 3 (three) times daily between meals.  . gabapentin (NEURONTIN) 100 MG  capsule Take 1 capsule (100 mg total) by mouth 3 (three) times daily.  [name] glucose blood (ONE TOUCH ULTRA TEST) test strip USE 1 STRIP TO CHECK GLUCOSE THREE TIMES DAILY  . insulin aspart (NOVOLOG) 100 UNIT/ML injection Inject 4-8 Units into the skin 3 (three) times daily with meals.  . Insulin Pen Needle 33G X 4 MM MISC 1 Units by Does not apply route 5 (five) times daily.  . Lancets (ONETOUCH ULTRASOFT) lancets Use as instructed  . liraglutide (VICTOZA) 18 MG/3ML SOPN Inject 0.1 mLs (0.6 mg total) into the skin in the morning.  . metoprolol succinate (TOPROL-XL) 50 MG 24 hr tablet Take 50 mg by mouth daily. Take with or immediately following a meal.  . mirtazapine (REMERON) 15 MG tablet Take 1 tablet (15 mg total) by mouth at bedtime.  . ONE TOUCH LANCETS MISC 1 Device by Does not apply route 3 (three) times daily.  [name] torsemide (DEMADEX) 20 MG tablet Take 1 tablet (20 mg total) by mouth 2 (two) times daily.   No facility-administered encounter medications on file as of 01/03/2020.   Review of patient status, including review of consultants reports, relevant laboratory and other test results, and collaboration with appropriate care team members and the patient's provider was performed as part of comprehensive patient evaluation and provision of care management services.    [name], [place] / [place]   [phone] 9:56 AM

## 2020-01-04 NOTE — Assessment & Plan Note (Addendum)
Chronic, uncontrolled.  Patient has very labile blood sugars ranging from 25 to 450 with significant fluctuations throughout the day. She is chronic insulin user, checking blood sugars >4 times per day.  She would benefit greatly from continuous glucose monitoring.  Will send orders in for this.  Will adjust medications to better control and help prevent hypoglycemia. -Decrease Tresiba to 32 units every morning -Continue Farxiga -Continue NovoLog sliding scale 4 to 8 units with meals -Start liraglutide 0.6 mg QD -Follow-up in 2 weeks to review blood sugar log and medication adjustment if indicated

## 2020-01-04 NOTE — Telephone Encounter (Signed)
I called patient to very address, as her address on her insurance card and what we have listed conflict. I need this information to insure she gets her CGM. Please clarify her physical address if she calls back.

## 2020-01-04 NOTE — Assessment & Plan Note (Addendum)
Patient continues to endorse depressed mood but appears improved from prior exam.  PHQ-9 score improved from 21-15 today.  Denies any active SI/HI.  Social worker [name] present during exam who helped extensively to establish [facility] with [facility].  Appointment scheduled for 01/13/2020 at 11 AM.  Patient is aware and plans to attend this appointment. Safety plan discussed with the patient. They are aware of how to contact crisis services if need be and instructed to go to the nearest emergency room if they feel they are in imminent danger of harming themselves and or others, or symptoms are of out control or unbearable.  - follow up with [facility] on 01/13/20 - follow up with PCP in 2 weeks via telemedicine appointment

## 2020-01-12 ENCOUNTER — Other Ambulatory Visit: Payer: Self-pay

## 2020-01-12 ENCOUNTER — Ambulatory Visit: Payer: Self-pay | Admitting: Licensed Clinical Social Worker

## 2020-01-12 ENCOUNTER — Ambulatory Visit: Payer: Medicare HMO

## 2020-01-12 NOTE — Chronic Care Management (AMB) (Signed)
   Social Work  Care Management Collaboration 01/12/2020 Name: KAYSEN SEFCIK MRN: [id number] DOB: 01-19-1956 JAYDYNN WOLFORD is a 64 y.o. year old female who sees [place], Archie Endo, DO for primary care.     Intervention: Patient was not interviewed or contacted during this encounter.  LCSW collaborated with CCM RN for ongoing patient needs.  CCM RN also reminded patient of counseling appointment with [facility] 01/13/20. Plan: LCSW will F/U with patient in 2 weeks  Review of patient status, including review of consultants reports, relevant laboratory and other test results, and collaboration with appropriate care team members and the patient's provider was performed as part of comprehensive patient evaluation and provision of chronic care management services.    [name], [place] / [place]   [phone] 12:20 PM

## 2020-01-12 NOTE — Patient Instructions (Signed)
Visit Information  Goals Addressed              This Visit's Progress     I can't remember to take my medications sometimes (pt-stated)        CARE PLAN ENTRY (see longtitudinal plan of care for additional care plan information)  Objective:  Lab Results  Component Value Date   HGBA1C 12.9 (A) 12/01/2019    Lab Results  Component Value Date   CREATININE 1.02 (H) 12/01/2019   CREATININE 1.23 (H) 04/29/2019   CREATININE 1.00 02/28/2019     Current Barriers:   Knowledge Deficits related to basic Diabetes pathophysiology and self care/management- patient states that she forgets to take her medications on time.  She states if she doesn't take it when she first thinks about it she will not remember.  She feels she has memory issues and has a lot going on in there life. That contributes to it.  Case Manager Clinical Goal(s):  Over the next 90 days, patient will demonstrate improved adherence to prescribed treatment plan for diabetes self care/management as evidenced by: lowering her A1c by 1-2 points  Interventions:   Provided education to patient about basic DM disease process  Reviewed medications with patient and discussed importance of medication adherence  Discussed plans with patient for ongoing care management follow up and provided patient with direct contact information for care management team  Advised patient, providing education and rationale, to check cbg with sliding scale and record, calling doctor office for findings outside established parameters.    The patient states that she has an appointment tomorrow for counseling at 11 am.  Plan 1. Patient will call her insurance to see if she gets extra bucks monthly to help her by a BP monitor.  She will call to notify me. 2. Write down her blood sugar values   Send patient a educational booklet on diabetes  Patient Self Care Activities:   UNABLE to independently manage diabetes  Self administers oral  medications as prescribed  Self administers insulin as prescribed  Attends all scheduled provider appointments  Initial goal documentation        Ms. Fruin was given information about Care Management services today including:  1. Care Management services include personalized support from designated clinical staff supervised by her physician, including individualized plan of care and coordination with other care providers 2. 24/7 contact phone numbers for assistance for urgent and routine care needs. 3. The patient may stop CCM services at any time (effective at the end of the month) by phone call to the office staff.  Patient agreed to services and verbal consent obtained.   The patient verbalized understanding of instructions provided today and declined a print copy of patient instruction materials.   The care management team will reach out to the patient again over the next 14 days.   Lazaro Arms RN, BSN, [facility] Care Management Coordinator [place] Phone: [phone] Fax: [phone]

## 2020-01-12 NOTE — Chronic Care Management (AMB) (Signed)
Care Management   Initial Visit Note  01/12/2020 Name: [name] MRN: [id number] DOB: September 25, 1955   Assessment: [name] is a 64 y.o. year old female who sees [place], Archie Endo, DO for primary care. The care management team was consulted for assistance with care management and care coordination needs related to Disease Management Educational Needs DM II/ HTN.   Review of patient status, including review of consultants reports, relevant laboratory and other test results, and collaboration with appropriate care team members and the patient's provider was performed as part of comprehensive patient evaluation and provision of care management services.    SDOH (Social Determinants of Health) assessments performed: No See Care Plan activities for detailed interventions related to [facility])     Outpatient Encounter Medications as of 01/12/2020  Medication Sig Note  . acetaminophen (TYLENOL) 500 MG tablet Take 1,000 mg by mouth every 6 (six) hours as needed for mild pain.   . Amino Acids-Protein Hydrolys (FEEDING SUPPLEMENT, PRO-STAT SUGAR FREE 64,) LIQD Take 30 mLs by mouth 3 (three) times daily with meals.   [name] atorvastatin (LIPITOR) 80 MG tablet Take 1 tablet (80 mg total) by mouth every morning.   . Blood Glucose Monitoring Suppl (ONE TOUCH ULTRA 2) W/DEVICE KIT 1 kit by Does not apply route once.   . candesartan (ATACAND) 4 MG tablet Take 1 tablet (4 mg total) by mouth daily.   . carvedilol (COREG) 12.5 MG tablet Take 12.5 mg by mouth 2 (two) times daily.   . cyclobenzaprine (FLEXERIL) 10 MG tablet Take 1 tablet (10 mg total) by mouth 3 (three) times daily as needed for muscle spasms. 01/12/2020: Patient states the bottle that she has 5 mg   . dapagliflozin propanediol (FARXIGA) 10 MG TABS tablet Take 10 mg by mouth daily.   . feeding supplement, ENSURE ENLIVE, (ENSURE ENLIVE) LIQD Take 237 mLs by mouth 3 (three) times daily between meals.   . gabapentin (NEURONTIN) 100 MG capsule Take  1 capsule (100 mg total) by mouth 3 (three) times daily.   [name] glucose blood (ONE TOUCH ULTRA TEST) test strip USE 1 STRIP TO CHECK GLUCOSE THREE TIMES DAILY   . insulin aspart (NOVOLOG) 100 UNIT/ML injection Inject 4-8 Units into the skin 3 (three) times daily with meals.   . insulin degludec (TRESIBA FLEXTOUCH) 100 UNIT/ML FlexTouch Pen Inject 0.32 mLs (32 Units total) into the skin daily.   . Insulin Pen Needle 33G X 4 MM MISC 1 Units by Does not apply route 5 (five) times daily.   . Lancets (ONETOUCH ULTRASOFT) lancets Use as instructed   . liraglutide (VICTOZA) 18 MG/3ML SOPN Inject 0.1 mLs (0.6 mg total) into the skin in the morning.   . mirtazapine (REMERON) 15 MG tablet Take 1 tablet (15 mg total) by mouth at bedtime.   . ONE TOUCH LANCETS MISC 1 Device by Does not apply route 3 (three) times daily.   . pantoprazole (PROTONIX) 40 MG tablet Take 40 mg by mouth daily.   [name] torsemide (DEMADEX) 20 MG tablet Take 1 tablet (20 mg total) by mouth 2 (two) times daily.   . Continuous Blood Gluc Receiver (DEXCOM G6 RECEIVER) DEVI 1 Device by Does not apply route continuous. (Patient not taking: Reported on 01/12/2020)   . Continuous Blood Gluc Sensor (DEXCOM G6 SENSOR) MISC 1 Device by Does not apply route continuous. (Patient not taking: Reported on 01/12/2020)   . Continuous Blood Gluc Transmit (DEXCOM G6 TRANSMITTER) MISC 1 Device by Does  not apply route continuous. (Patient not taking: Reported on 01/12/2020)   . metoprolol succinate (TOPROL-XL) 50 MG 24 hr tablet Take 50 mg by mouth daily. Take with or immediately following a meal. (Patient not taking: Reported on 01/12/2020) 01/12/2020: Patient states she was switched to Carvedilol   No facility-administered encounter medications on file as of 01/12/2020.    Goals Addressed              This Visit's Progress   .  I can't remember to take my medications sometimes (pt-stated)        CARE PLAN ENTRY (see longtitudinal plan of care for additional  care plan information)  Objective:  Lab Results  Component Value Date   HGBA1C 12.9 (A) 12/01/2019 .   Lab Results  Component Value Date   CREATININE 1.02 (H) 12/01/2019   CREATININE 1.23 (H) 04/29/2019   CREATININE 1.00 02/28/2019     Current Barriers:  [name] Knowledge Deficits related to basic Diabetes pathophysiology and self care/management- patient states that she forgets to take her medications on time.  She states if she doesn't take it when she first thinks about it she will not remember.  She feels she has memory issues and has a lot going on in there life. That contributes to it.  Case Manager Clinical Goal(s):  Over the next 90 days, patient will demonstrate improved adherence to prescribed treatment plan for diabetes self care/management as evidenced by: lowering her A1c by 1-2 points  Interventions:  . Provided education to patient about basic DM disease process . Reviewed medications with patient and discussed importance of medication adherence . Discussed plans with patient for ongoing care management follow up and provided patient with direct contact information for care management team . Advised patient, providing education and rationale, to check cbg with sliding scale and record, calling doctor office for findings outside established parameters.   . The patient states that she has an appointment tomorrow for counseling at 11 am. . Plan 1. Patient will call her insurance to see if she gets extra bucks monthly to help her by a BP monitor.  She will call to notify me. 2. Write down her blood sugar values  . Send patient a educational booklet on diabetes  Patient Self Care Activities:  . UNABLE to independently manage diabetes . Self administers oral medications as prescribed . Self administers insulin as prescribed . Attends all scheduled provider appointments  Initial goal documentation         Follow up plan:  The care management team will reach out to the  patient again over the next 14 days.   [name] was given information about Care Management services today including:  1. Care Management services include personalized support from designated clinical staff supervised by a physician, including individualized plan of care and coordination with other care providers 2. 24/7 contact phone numbers for assistance for urgent and routine care needs. 3. The patient may stop Care Management services at any time (effective at the end of the month) by phone call to the office staff.  Patient agreed to services and verbal consent obtained.  Lazaro Arms RN, BSN, [facility] Care Management Coordinator [place] Phone: [phone] Fax: [phone]

## 2020-01-17 ENCOUNTER — Telehealth (INDEPENDENT_AMBULATORY_CARE_PROVIDER_SITE_OTHER): Payer: Medicare HMO | Admitting: Family Medicine

## 2020-01-17 NOTE — Progress Notes (Signed)
Attempted to contact patient for our scheduled telemedicine visit three times without success. LMoVM for patient to call back to reschedule.

## 2020-01-18 ENCOUNTER — Telehealth: Payer: Self-pay | Admitting: *Deleted

## 2020-01-18 DIAGNOSIS — N179 Acute kidney failure, unspecified: Secondary | ICD-10-CM | POA: Diagnosis not present

## 2020-01-18 DIAGNOSIS — N184 Chronic kidney disease, stage 4 (severe): Secondary | ICD-10-CM | POA: Diagnosis not present

## 2020-01-18 DIAGNOSIS — Z794 Long term (current) use of insulin: Secondary | ICD-10-CM | POA: Diagnosis not present

## 2020-01-18 DIAGNOSIS — J438 Other emphysema: Secondary | ICD-10-CM | POA: Diagnosis not present

## 2020-01-18 DIAGNOSIS — R69 Illness, unspecified: Secondary | ICD-10-CM | POA: Diagnosis not present

## 2020-01-18 DIAGNOSIS — I428 Other cardiomyopathies: Secondary | ICD-10-CM | POA: Diagnosis not present

## 2020-01-18 DIAGNOSIS — I10 Essential (primary) hypertension: Secondary | ICD-10-CM | POA: Diagnosis not present

## 2020-01-18 DIAGNOSIS — E1122 Type 2 diabetes mellitus with diabetic chronic kidney disease: Secondary | ICD-10-CM | POA: Diagnosis not present

## 2020-01-18 DIAGNOSIS — I129 Hypertensive chronic kidney disease with stage 1 through stage 4 chronic kidney disease, or unspecified chronic kidney disease: Secondary | ICD-10-CM | POA: Diagnosis not present

## 2020-01-18 DIAGNOSIS — Z041 Encounter for examination and observation following transport accident: Secondary | ICD-10-CM | POA: Diagnosis not present

## 2020-01-18 NOTE — Chronic Care Management (AMB) (Signed)
  Chronic Care Management   Note  01/18/2020 Name: MARY-ANN PENNELLA MRN: [id number] DOB: Jan 02, 1956  HENNIE GOSA is a 64 y.o. year old female who is a primary care patient of [name], DO and is actively engaged with the care management team. I reached out to [name] by phone today to assist with re-scheduling a follow up visit with the RN Case Manager.  Follow up plan: Telephone appointment with care management team member scheduled for: 02/02/2020  [facility]  [place], [place] [id number] Direct Dial: [email or web address] Website: [email or web address]

## 2020-01-26 ENCOUNTER — Ambulatory Visit (INDEPENDENT_AMBULATORY_CARE_PROVIDER_SITE_OTHER): Payer: Medicare HMO | Admitting: Family Medicine

## 2020-01-26 ENCOUNTER — Encounter: Payer: Self-pay | Admitting: Family Medicine

## 2020-01-26 ENCOUNTER — Ambulatory Visit: Payer: Medicare HMO | Admitting: Licensed Clinical Social Worker

## 2020-01-26 ENCOUNTER — Telehealth: Payer: Medicare HMO

## 2020-01-26 ENCOUNTER — Other Ambulatory Visit: Payer: Self-pay

## 2020-01-26 DIAGNOSIS — F332 Major depressive disorder, recurrent severe without psychotic features: Secondary | ICD-10-CM | POA: Diagnosis not present

## 2020-01-26 DIAGNOSIS — R69 Illness, unspecified: Secondary | ICD-10-CM | POA: Diagnosis not present

## 2020-01-26 DIAGNOSIS — Z7189 Other specified counseling: Secondary | ICD-10-CM

## 2020-01-26 MED ORDER — TRAMADOL HCL 50 MG PO TABS: 50.0000 mg | ORAL_TABLET | Freq: Three times a day (TID) | ORAL | 0 refills | Status: AC | PRN

## 2020-01-26 MED ORDER — BACLOFEN 10 MG PO TABS: 10.0000 mg | ORAL_TABLET | Freq: Three times a day (TID) | ORAL | 0 refills | Status: AC | PRN
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
## 2020-01-26 NOTE — Patient Instructions (Signed)
It was wonderful to see you today.  It would be fine to increase your Tylenol to 1000 mg 3-4 times daily (every 6-8 hours) and use heating pad 20 minutes at a time on your neck and chest several times a day.  I have also sent in a medication called tramadol, this is a stronger medicine and should be used sparingly only with severe pain.  It can make you drowsy so be careful with this.  Lastly, I sent in baclofen which helps with the muscle spasms and should be less sedating than the Flexeril.  Please go to [facility] imaging center at your convenience to get images of your chest and spine.  These results will come to me.  If you have any new onset weakness, bowel or bladder difficulties, pain out of control, or any new numbness/tingling please go to the ED.  Please follow-up in the next 1-2 weeks.
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
## 2020-01-30 ENCOUNTER — Encounter: Payer: Self-pay | Admitting: Family Medicine

## 2020-02-02 ENCOUNTER — Telehealth: Payer: Self-pay

## 2020-02-02 ENCOUNTER — Telehealth: Payer: Medicare HMO

## 2020-02-02 NOTE — Telephone Encounter (Signed)
  Care Management   Outreach Note  02/02/2020 Name: [name] MRN: [id number] DOB: [birth year]  Referred by: [name], DO Reason for referral : No chief complaint on file.   An unsuccessful telephone outreach was attempted today. The patient was referred to the case management team for assistance with care management and care coordination.   Follow Up Plan: A HIPPA compliant phone message was left for the patient providing contact information and requesting a return call.  The care management team will reach out to the patient again over the next 7-14 days.   Lazaro Arms RN, BSN, [facility] Care Management Coordinator [place] Phone: [phone] Fax: [phone]

## 2020-02-06 NOTE — Telephone Encounter (Signed)
Spoke with patient rescheduled telephone call on 02/15/2020

## 2020-02-13 ENCOUNTER — Ambulatory Visit
Admission: RE | Admit: 2020-02-13 | Discharge: 2020-02-13 | Disposition: A | Discharge status: Home / Self Care | Payer: Medicare HMO | Source: Ambulatory Visit | Attending: Family Medicine | Admitting: Family Medicine

## 2020-02-13 DIAGNOSIS — S299XXA Unspecified injury of thorax, initial encounter: Secondary | ICD-10-CM | POA: Diagnosis not present

## 2020-02-13 DIAGNOSIS — I700 Atherosclerosis of aorta: Secondary | ICD-10-CM | POA: Diagnosis not present

## 2020-02-13 DIAGNOSIS — M542 Cervicalgia: Secondary | ICD-10-CM | POA: Diagnosis not present

## 2020-02-15 ENCOUNTER — Telehealth: Payer: Self-pay | Admitting: Family Medicine

## 2020-02-15 ENCOUNTER — Ambulatory Visit: Payer: Medicare HMO

## 2020-02-15 ENCOUNTER — Telehealth: Payer: Self-pay | Admitting: Pharmacist

## 2020-02-15 DIAGNOSIS — IMO0002 Reserved for concepts with insufficient information to code with codable children: Secondary | ICD-10-CM

## 2020-02-15 DIAGNOSIS — Z789 Other specified health status: Secondary | ICD-10-CM
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
## 2020-02-15 NOTE — Telephone Encounter (Signed)
Contacted patient at the request of CCM consult - Lazaro Arms, RN.   Patient reports low blood sugar readings (multiple < 80mg /dl) AND low blood pressure readings (multiple systolic readings< 096).   Following discussion of current medication regimen we agreed to make several changes today and for the patient to come to Rx Clinic tomorrow at 10:30 AM.    Diabetes Medications:  Continue Farxiga 10mg  daily, Victoza 0.6mg  daily and Novolog 4-8 sliding scale. Decreased Tresiba from 32 to 24 units today.   HTN Medications: HOLD both carvedilol 12.5mg  and candasartan 4mg  daily today.    Reevaluate blood pressure again tomorrow.   Patient verbalized she is doing better with recent depressive symptoms and she is eating better (healthier).
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
## 2020-02-15 NOTE — Telephone Encounter (Signed)
   SF 02/15/2020   Name: [name]   MRN: [id number]   DOB: September 19, 1955   AGE: 64 y.o.   GENDER: female   PCP Mullis, Kiersten P, DO.   Called pt regarding [employer] Referral for transportation. Informed Ms. Pruett that [place] will try to set up transportation services through [facility] and that if transportation is confirmed she will receive a call from their office. Patient stated understanding. Also asked patient if she will be riding alone and if she uses a wheelchair or walker. Patient stated that she will be riding alone and she does not use a walker or a wheelchair. Updated intake form for [facility]. Sent e-mail to [employer] with attached General Intake form.   From: Audelia Hives [email or web address]> To: Transportation Services [email or web address]  Subject: SECURE: General Intake Form for [employer]  [place],   Attached is the General Intake Form for Ms. Emrys Mceachron. The patient has an appointment scheduled for tomorrow February 16, 2020 at 10:30 am.   Thanks,   [place], Care Management Phone: [phone] Email: [email or web address]      [place], Care Management Phone: [phone] Email: [email or web address]

## 2020-02-16 ENCOUNTER — Other Ambulatory Visit: Payer: Self-pay

## 2020-02-16 ENCOUNTER — Ambulatory Visit: Payer: Medicare HMO | Admitting: Licensed Clinical Social Worker

## 2020-02-16 ENCOUNTER — Encounter: Payer: Self-pay | Admitting: Pharmacist

## 2020-02-16 ENCOUNTER — Ambulatory Visit (INDEPENDENT_AMBULATORY_CARE_PROVIDER_SITE_OTHER): Payer: Medicare HMO | Admitting: Pharmacist

## 2020-02-16 VITALS — BP 133/77 | HR 93 | Ht 62.0 in | Wt 122.4 lb

## 2020-02-16 DIAGNOSIS — E1165 Type 2 diabetes mellitus with hyperglycemia: Secondary | ICD-10-CM | POA: Diagnosis not present

## 2020-02-16 DIAGNOSIS — Z794 Long term (current) use of insulin: Secondary | ICD-10-CM | POA: Diagnosis not present

## 2020-02-16 DIAGNOSIS — I10 Essential (primary) hypertension: Secondary | ICD-10-CM | POA: Diagnosis not present

## 2020-02-16 DIAGNOSIS — Z7189 Other specified counseling: Secondary | ICD-10-CM

## 2020-02-16 DIAGNOSIS — E1140 Type 2 diabetes mellitus with diabetic neuropathy, unspecified: Secondary | ICD-10-CM | POA: Diagnosis not present

## 2020-02-16 DIAGNOSIS — Z139 Encounter for screening, unspecified: Secondary | ICD-10-CM

## 2020-02-16 DIAGNOSIS — IMO0002 Reserved for concepts with insufficient information to code with codable children: Secondary | ICD-10-CM

## 2020-02-16 MED ORDER — TRESIBA FLEXTOUCH 100 UNIT/ML ~~LOC~~ SOPN: 22.0000 [IU] | PEN_INJECTOR | Freq: Every day | SUBCUTANEOUS | 2 refills | Status: DC

## 2020-02-16 NOTE — Progress Notes (Signed)
Reviewed: I agree with Dr. Koval's documentation and management. 

## 2020-02-16 NOTE — Patient Instructions (Addendum)
It was nice to see you today!  Your goal blood sugar is 80-130 before eating and less than 180 after eating.  Medication Changes: Start taking Tresiba 22 units daily, if you sugar continues to be less than 80 then reduce to 18 units   Continue Victoza 0.6 mg daily  Continue using Novolog with meals   Continue Farxiga 10 mg daily  Continue taking Candesartan 4 mg daily  STOP taking Carvedilol for now!  Use Fairlife Lactose-free milk to use with Pro-stat  Glucerna Hunger Smart  Ensure Max Protein   Will give you a call next Tuesday!  Monitor blood sugars at home and keep a log (glucometer or piece of paper) to bring with you to your next visit.  Keep up the good work with diet and exercise. Aim for a diet full of vegetables, fruit and lean meats (chicken, [place], fish). Try to limit salt intake by eating fresh or frozen vegetables (instead of canned), rinse canned vegetables prior to cooking and do not add any additional salt to meals.

## 2020-02-16 NOTE — Assessment & Plan Note (Signed)
Hypertension longstanding currently uncontrolled. Blood pressure goal = <130/80 mmHg. Medication adherence appears variable. Yesterday, patient verbally reported multiple low BP readings (SBP <100), however Clinic BP today was 133/77 and last low SBP was 78 on 8/3 (~1 month ago) per written BP log. Additionally, over the past week SBP readings ranged 140-170s.   -Continue Candesartan 4 mg daily -HOLD carvedilol 12.5 mg BID

## 2020-02-16 NOTE — Telephone Encounter (Signed)
I plan to call this patient Tuesday and reevaluate blood glucose and blood pressure.   I think it may be appropriate to handle her through telehealth (phone most likely) over the upcoming weeks as she has no car.   [place] paid for her transport today from [place].
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
## 2020-02-16 NOTE — Assessment & Plan Note (Signed)
Diabetes longstanding currently uncontrolled with A1C of 12.9%. Medication adherence appears variable - reports adherence with Tyler Aas, however stopped Novolog 1 month ago. Patient experiencing multiple low sugars over the past two weeks including this morning. Patient is able to verbalize appropriate hypoglycemia management plan.  -Decreased Tresiba to 22 units daily, if sugars less than 80 then decrease to 18 units -Continue Novolog 4-8 units TID w/meals -Continue Farxiga 10 mg daily -Continue Victoza 0.6 mg daily -Encouraged patient to use Ensure Max Protein or Glucerna Hunger Smart for meal supplementation (less carbs and sugar) -Extensively discussed pathophysiology of diabetes, recommended lifestyle interventions, dietary effects on blood sugar control -Counseled on s/sx of and management of hypoglycemia -Next A1C anticipated September 2021.

## 2020-02-16 NOTE — Telephone Encounter (Signed)
Thank you so much for reaching out to my patient and helping with the hypotension and hypoglycemia!! I really appreciate it!

## 2020-02-16 NOTE — Progress Notes (Signed)
S:     Patient arrives in good spirits.  Presents for diabetes evaluation, education, and management. Patient was referred and last seen by Primary Care Provider on 01/03/20.    Yesterday (9/1), patient reported multiple low blood sugar readings (80mg /dL), therefore Tyler Aas was decreased from 32 to 24 units daily. Also reported multiple low blood pressure readings (SBP<100), therefore patient was instructed to hold carvediolol and candesartan and follow-up in pharmacy clinic the next day (9/2).  Today, patient reports taking Tresiba 32 units daily (not 24 units as instructed) and stopped using Novolog a month ago due to lack of appetite and not eating enough meals daily. Reports medication adherence with Farxiga 10 mg daily, Victoza 0.6 mg daily, and atorvastatin 80 mg daily. Reports taking candesartan this morning, however held carvedilol yesterday and today as instructed. Home blood sugar readings ranged from 60s-200s (multiple lows in 60-70s and sugar this morning was 68 and 72) and BP readings from the past week ranged from SBP 140-170s; patient did experience symptomatic hypotension on 8/3 - SBP 78 (dizziness, lightheaded, and fell). Reports lack of appetite due to depression and stopped using mirtazapine 3 months ago because it was very sedating. States provider at [facility] prescribed her a new medication (name unknown), however has not picked up new med due to lack of transportation.  Insurance coverage/medication affordability: [employer] Medicare  Medication adherence reported is variable.   Current diabetes medications include: Tresiba 24 units (took 32 units yesterday), Novolog 4-8 units TID w/meals, Farxiga 10 mg daily, Victoza 0.6 mg daily Current hypertension medications include: Candesartan 4 mg daily, carvedilol 12.5 mg BID (HELD), torsemide 20 mg BID Current hyperlipidemia medications include: Atorvastatin 80 mg daily  Patient reports hypoglycemic events.   O:    Physical Exam Vitals reviewed.  Constitutional:      Appearance: Normal appearance. She is normal weight.  Neurological:     Mental Status: She is alert.  Psychiatric:        Mood and Affect: Mood normal.        Behavior: Behavior normal.        Thought Content: Thought content normal.    Review of Systems  All other systems reviewed and are negative.   Lab Results  Component Value Date   HGBA1C 12.9 (A) 12/01/2019   Vitals:   02/16/20 1030  BP: 133/77  Pulse: 93  SpO2: 98%    Lipid Panel     Component Value Date/Time   CHOL 304 (H) 12/01/2019 1651   TRIG 164 (H) 12/01/2019 1651   HDL 64 12/01/2019 1651   CHOLHDL 4.8 (H) 12/01/2019 1651   CHOLHDL 3.5 07/23/2018 1122   VLDL 18 07/23/2018 1122   LDLCALC 209 (H) 12/01/2019 1651   LDLDIRECT 237 (H) 06/25/2017 1337   LDLDIRECT 101 (H) 05/31/2013 1421    Home sugar readings: 60s-200s (multiple lows in 60-70s and sugar this morning was 68 and 72)  BP readings from the past week ranged from SBP 140-170s; experienced symptomatic hypotension on 8/3 - SBP 78   Clinical Atherosclerotic Cardiovascular Disease (ASCVD): Yes  The 10-year ASCVD risk score Mikey Bussing DC Jr., et al., 2013) is: 28.7%   Values used to calculate the score:     Age: 62 years     Sex: Female     Is Non-Hispanic African American: Yes     Diabetic: Yes     Tobacco smoker: No     Systolic Blood Pressure: 762 mmHg  Is BP treated: Yes     HDL Cholesterol: 64 mg/dL     Total Cholesterol: 304 mg/dL    A/P: Diabetes longstanding currently uncontrolled with A1C of 12.9%. Medication adherence appears variable - reports adherence with Tyler Aas, however stopped Novolog 1 month ago. Patient experiencing multiple low sugars over the past two weeks including this morning. Patient is able to verbalize appropriate hypoglycemia management plan.  -Decreased Tresiba to 22 units daily, if sugars less than 80 then decrease to 18 units -Continue Novolog 4-8 units TID  w/meals -Continue Farxiga 10 mg daily -Continue Victoza 0.6 mg daily -Encouraged patient to use Ensure Max Protein or Glucerna Hunger Smart for meal supplementation (less carbs and sugar) -Extensively discussed pathophysiology of diabetes, recommended lifestyle interventions, dietary effects on blood sugar control -Counseled on s/sx of and management of hypoglycemia -Next A1C anticipated September 2021.   ASCVD risk - primary prevention in patient with diabetes. Last LDL is not controlled at 209 on 6/17/201. ASCVD risk score is >20%  - high intensity statin indicated. -Continue Atorvastatin 80 mg daily -Consider Zetia 10 mg daily at next visit to reach LDL goal < 70 mg/dL  Hypertension longstanding currently uncontrolled. Blood pressure goal = <130/80 mmHg. Medication adherence appears variable. Yesterday, patient verbally reported multiple low BP readings (SBP <100), however Clinic BP today was 133/77 and last low SBP was 78 on 8/3 (~1 month ago) per written BP log. Additionally, over the past week SBP readings ranged 140-170s.   -Continue Candesartan 4 mg daily -HOLD carvedilol 12.5 mg BID  Written patient instructions provided.  Total time in face to face counseling 30 minutes.   Follow up telephone Visit Tuesday, September 7th.  Patient seen with Fara Olden, PharmD - PGY-1 Resident. and Lorel Monaco, PharmD, PGY2 Pharmacy Resident.

## 2020-02-21 ENCOUNTER — Ambulatory Visit: Payer: Medicare HMO | Admitting: Licensed Clinical Social Worker

## 2020-02-21 ENCOUNTER — Telehealth: Payer: Self-pay | Admitting: Pharmacist

## 2020-02-21 DIAGNOSIS — Z7189 Other specified counseling: Secondary | ICD-10-CM
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
## 2020-02-21 NOTE — Telephone Encounter (Signed)
Patient contacted for follow-up of blood sugars and blood pressures after visit on 02/16/2020.  Patient reports lowest reading of 90 with adjustments to blood sugar regimen.  She reports taking, as recently suggested, [place]:  22 units once daily, novolog 4-8 units with sliding scale (mostly 4 units).   No adjustments at this time.   Patient reported blood pressure yesterday 139/74.  She denies any dizziness and reports taking only candesartan 4mg  once daily.   (She continues to hold carvdilol).    We discussed need for physician visit in 2 days and discussed potential reschedule to later.  She is currently challenged by transportation issues but would like to keep appointment with physician to discuss back and neck pain.    I shared that I would have [name], CSW contact her to discuss options.   I will plan phone follow-up  In 10-14 days to reassess status.

## 2020-02-23 ENCOUNTER — Ambulatory Visit: Payer: Medicare HMO | Admitting: Family Medicine

## 2020-02-23 DIAGNOSIS — R69 Illness, unspecified: Secondary | ICD-10-CM | POA: Diagnosis not present

## 2020-02-27 ENCOUNTER — Telehealth: Payer: Medicare HMO

## 2020-03-06 ENCOUNTER — Telehealth: Payer: Self-pay | Admitting: Pharmacist

## 2020-03-06 DIAGNOSIS — IMO0002 Reserved for concepts with insufficient information to code with codable children: Secondary | ICD-10-CM

## 2020-03-06 MED ORDER — TRESIBA FLEXTOUCH 100 UNIT/ML ~~LOC~~ SOPN: 20.0000 [IU] | PEN_INJECTOR | Freq: Every day | SUBCUTANEOUS | Status: DC

## 2020-03-06 NOTE — Telephone Encounter (Signed)
Noted and agree. 

## 2020-03-06 NOTE — Telephone Encounter (Signed)
Patient follow-up phone all for Diabetes and Hypertension.  Blood sugar control readings 80-300 with majority of readings 100-199.  Several post-prandial were elevated to > 200.   Patient reports taking: -Tresiba 22 units daily - Novolog 4-8 units on sliding scale 100-199 = 4 units,  200-299 = 6 units, 300 or higher = 8 units - Victoza 0.6mg  daily - Farxiga 10 mg daily  Patient reports minimal dizziness/high-headed feeling since last contact.  Continues on candesartan once daily.   Following several minutes of discussion including detailed meal review (patient typically eating 1 meal per day) we agreed to reduce Tresiba to 20 units once daily, and continue all other medications the same.    NOTE*Patient was recently prescribed amitriptyline 25mg  once daily.  Patient educated on purpose, proper use and potential adverse effects including sedation, and anti-cholinergic side effects.  Following instruction patient verbalized understanding of treatment plan.  She agreed to try this medication.    I will plan to call her again in 1 week.

## 2020-03-07 ENCOUNTER — Ambulatory Visit: Payer: Medicare HMO | Admitting: Family Medicine

## 2020-03-07 NOTE — Progress Notes (Deleted)
    SUBJECTIVE:   CHIEF COMPLAINT / HPI:   Diabetes Current Regimen: *** CBGs: ***  Last A1c: *** on ***  Denies polyuria, polydipsia, hypoglycemia *** Last Eye Exam: *** Statin: *** ACE/ARB: ***  PERTINENT  PMH / PSH: ***  OBJECTIVE:   There were no vitals taken for this visit.  ***  ASSESSMENT/PLAN:   No problem-specific Assessment & Plan notes found for this encounter.     Gerlene Fee, [place]

## 2020-03-13 ENCOUNTER — Telehealth: Payer: Self-pay | Admitting: Pharmacist

## 2020-03-13 NOTE — Telephone Encounter (Signed)
Attempted to contact RE blood glucose control.  No answer X2 attempts.   I left messag stating I would try back in 2 days.

## 2020-03-16 ENCOUNTER — Telehealth: Payer: Self-pay | Admitting: Pharmacist

## 2020-03-16 ENCOUNTER — Telehealth: Payer: Medicare HMO

## 2020-03-16 NOTE — Telephone Encounter (Signed)
I discussed with kiestein and she is ok with video visits with this patient.

## 2020-03-16 NOTE — Telephone Encounter (Signed)
Contacted patient to follow-up of Diabetes control.   Patient reports that her blood sugar has been under fairly good control.  She reported that her readings are randing 90-350 with majority of readings in the 100-low 200s.   She admits to missing her meal time insulin at times and believes that is the reason for the very high readings.   Following discussion we decided to continue same doses of insulin.  Tresiba 20 units daily Novolog SSI 4-6-8 units prior to meals.   I plan to call her in ~ 10 days for further blood sugar evaluation and potential adjustment.   Patient reports eating more consistently since starting amitriptyline.  She states she feels slightly better.  She admits that she is no longer crying as frequently.   She sounded more positive.    Patient was rejected for Dexcom at her pharmacy.  As she is > 59.66 years old, we will try to work through coverage for this patient testing 4 times daily and doing 4 injections daily to obtain CGM 0 Dexcom.  She is aware that a company named [employer] will be assisting with this process.     Patient requested video visit with PCP if possible.  I shared that I would inquire if that was a possibility given her unique transportation issue.   I will request advice from Dr. Gwendlyn Deutscher, and other staff members.

## 2020-03-20 NOTE — Telephone Encounter (Signed)
Dexcom PA faxed to [employer].

## 2020-03-20 NOTE — Telephone Encounter (Signed)
LMOVM for pt to call back and schedule a video visit. Christen Bame, CMA

## 2020-03-21 ENCOUNTER — Telehealth: Payer: Medicare HMO

## 2020-03-21 ENCOUNTER — Telehealth: Payer: Self-pay

## 2020-03-21 NOTE — Telephone Encounter (Signed)
°  Care Management   Outreach Note  03/21/2020 Name: [name] MRN: [id number] DOB: 1956-05-28  Referred by: [name], DO Reason for referral : Appointment (DM II)   An unsuccessful telephone outreach was attempted today. The patient was referred to the case management team for assistance with care management and care coordination.   Follow Up Plan: A HIPAA compliant phone message was left for the patient providing contact information and requesting a return call.  The care management team will reach out to the patient again over the next 7-14 days.   Lazaro Arms RN, BSN, [facility] Care Management Coordinator [place] Phone: [phone] Fax: [phone]

## 2020-03-22 NOTE — Telephone Encounter (Signed)
Called spoke with patient rescheduled for FU call on 03/30/2020 with RN CM

## 2020-03-26 ENCOUNTER — Telehealth: Payer: Self-pay | Admitting: Pharmacist

## 2020-03-26 NOTE — Telephone Encounter (Signed)
Attempted call for follow-up of DM regimen / insulin dose adjustment.   No answer, left message I would try back later.

## 2020-03-27 ENCOUNTER — Telehealth: Payer: Self-pay | Admitting: Pharmacist

## 2020-03-27 NOTE — Telephone Encounter (Signed)
Reviewed and agree.

## 2020-03-27 NOTE — Telephone Encounter (Signed)
Patient contact to follow-up on blood sugar control.   Patient reports improved appetite but was unable to tolerate her antidepressant.  She has an appointment in 3 days to talk to her mental health provider to discuss a new option.  She stated she was eating more consistently.  She denied any symptomatic/low blood sugar readings.   Patient reports blood sugar readings of: 180 369 179  118 269 252 134, 107,        113, 249 143, 108, 233     153,  303, Reports insulin regimen of: Tresiba 22 units in the AM Novolog 4,6,8 sliding scale   Patient has not yet scheduled a virtual visit with Dr. Tarry Kos.  She plans to call back this after noon.   Patient plans to call [facility] RE her CGM.  She stated she would also do that today.   No change in insulin regimen at this time.  I plan to call her again in ~ 2-3 weeks.

## 2020-03-28 DIAGNOSIS — E118 Type 2 diabetes mellitus with unspecified complications: Secondary | ICD-10-CM | POA: Diagnosis not present

## 2020-03-29 NOTE — Telephone Encounter (Signed)
Pt has an in-person appt on 04/13/20.  LMOVM for her to call back to discuss turning it into a virtual. Christen Bame, CMA

## 2020-03-30 ENCOUNTER — Telehealth: Payer: Medicare HMO

## 2020-03-30 ENCOUNTER — Telehealth: Payer: Self-pay

## 2020-03-30 DIAGNOSIS — E118 Type 2 diabetes mellitus with unspecified complications: Secondary | ICD-10-CM | POA: Diagnosis not present

## 2020-03-30 NOTE — Telephone Encounter (Signed)
  Care Management   Outreach Note  03/30/2020 Name: [name] MRN: [id number] DOB: 1956/05/27  Referred by: [name], DO Reason for referral : Appointment (DM II)   A second unsuccessful telephone outreach was attempted today. The patient was referred to the case management team for assistance with care management and care coordination.   Follow Up Plan: A HIPAA compliant phone message was left for the patient providing contact information and requesting a return call.  The care management team will reach out to the patient again over the next 7-10 days.   Lazaro Arms RN, BSN, [facility] Care Management Coordinator [place] Phone: [phone] Fax: [phone]

## 2020-04-05 NOTE — Telephone Encounter (Signed)
Spoke with patient rescheduled for 04/11/2020

## 2020-04-05 NOTE — Telephone Encounter (Signed)
Contacted patient regarding CGM teaching. Patient confirms she and her husband will be able to come in-person Friday, October 29th for her appointment with Dr. Tarry Kos. At that time, pharmacy will provide counseling and education regarding Dexcom CGM. Instructed patient to bring all materials to her visit. Patient verbalized understanding.  [name], PharmD PGY2 Ambulatory Care Resident [place]

## 2020-04-11 ENCOUNTER — Telehealth: Payer: Medicare HMO

## 2020-04-11 ENCOUNTER — Telehealth: Payer: Self-pay

## 2020-04-11 NOTE — Telephone Encounter (Signed)
  Care Management   Outreach Note  04/11/2020 Name: [name] MRN: [id number] DOB: [birth year]  Referred by: [name], DO Reason for referral : Appointment (DM II)   Third unsuccessful telephone outreach was attempted today. The patient was referred to the case management team for assistance with care management and care coordination. The patient's primary care provider has been notified of our unsuccessful attempts to make or maintain contact with the patient. The care management team is pleased to engage with this patient at any time in the future should he/she be interested in assistance from the care management team.   Follow Up Plan: We have been unable to make contact with the patient for follow up. The care management team is available to follow up with the patient after provider conversation with the patient regarding recommendation for care management engagement and subsequent re-referral to the care management team.   Lazaro Arms RN, BSN, [facility] Care Management Coordinator [place] Phone: [phone] Fax: [phone]

## 2020-04-12 ENCOUNTER — Telehealth: Payer: Self-pay | Admitting: Pharmacist

## 2020-04-12 DIAGNOSIS — IMO0002 Reserved for concepts with insufficient information to code with codable children: Secondary | ICD-10-CM

## 2020-04-12 DIAGNOSIS — E1140 Type 2 diabetes mellitus with diabetic neuropathy, unspecified: Secondary | ICD-10-CM

## 2020-04-12 NOTE — Telephone Encounter (Signed)
Contacted patient regarding CGM teaching. Patient confirmed she will able to come in-person tomorrow (10/29) for her PCP appointment with Dr. Tarry Kos scheduled at 4:10PM. At that time, pharmacy will provide counseling and education regarding Dexcom CGM. Instructed patient to bring all materials to her visit. Patient verbalized understanding.

## 2020-04-12 NOTE — Progress Notes (Signed)
Subjective:   Patient ID: [name]    DOB: 1955/07/29, 64 y.o. female   MRN: [id number]  [name] is a 64 y.o. female with a history of combined CHF, nonischemic cardiomyopathy, HTN, valvular heart disease, COPD w/ emphasema, GERD, HLD, neuropathy, uncontrolled T2DM, h/o tobacco abuse, MDD here for diabetes f/u  Uncontrolled Diabetes: Last three A1C's below. Currently on Tresiba 22U in AM, Novolog 4,6,8 units, Farxiga 10mg  QD, and Liraglutide (Victoza) 0.6mg  qAM. Endorses compliance. Notes CBGs range 150-250. Low BS of 54 x 1. Denies any polyuria, polydipsia, polyphagia. Due for diabetic foot exam.  Has been approved for DexCOM and plan for training today after visit by pharmacy team. Endorses some occasional nausea when she eats since starting the Victoza.   Lab Results  Component Value Date   HGBA1C 9.8 (A) 04/13/2020   HGBA1C 12.9 (A) 12/01/2019   HGBA1C 10.1 (A) 08/08/2019     HTN:  BP: 112/64 today. Currently on Candesartan 4mg  QD, Coreg 12.5mg  BID. Endorses compliance.  Former smoker. Denies any chest pain, SOB, vision changes, or headaches.   Health Maintenance: Health Maintenance Due  Topic  . OPHTHALMOLOGY EXAM   . MAMMOGRAM   . INFLUENZA VACCINE   . FOOT EXAM    COPD  H/o Tobacco abuse: Smoked 1/2-3/4 pack/day x 25 years, quit February 2020, (25 pack year history). Does qualify for lung cancer screen at this time.  Blurry Vision  Due for diabetic eye exam: Patient endorses blurry vision.  She requests referral to eye doctor. She notes that this has been gradual worsening.   Review of Systems:  Per HPI.   Objective:   BP 112/64   Pulse (!) 105   Ht 5\' 2"  (1.575 m)   Wt 121 lb (54.9 kg)   SpO2 99%   BMI 22.13 kg/m  Vitals and nursing note reviewed.  General: pleasant older female, sitting comfortably in exam chair, well nourished, well developed, in no acute distress with non-toxic appearance HEENT: normocephalic, atraumatic, moist mucous  membranes, oropharynx clear without erythema or exudate CV: regular rate and rhythm without murmurs, rubs, or gallops Lungs: clear to auscultation bilaterally with normal work of breathing on room air, speaking in full sentences Abdomen: soft, non-tender, non-distended, no masses or organomegaly palpable, normoactive bowel sounds, no epigastric pai Skin: warm, dry Extremities: warm and well perfused MSK:  gait normal Neuro: Alert and oriented, speech normal  Assessment & Plan:   Uncontrolled type 2 diabetes mellitus with diabetic neuropathy, with long-term current use of insulin (HCC) Chronic, uncontrolled but improving. Dexcom training today. Improved blood sugars with only one reported hypoglycemic episode. Does endorse some nausea with the Victoza with meals. Will discuss with pharmacy team regarding lowering dose vs continuing current meds.  - continue current meds - follow up in 1 month  - patient instructed to keep a log of blood pressure/blood sugars - due for diabetic eye exam - referral placed and list mailed to patient  Primary hypertension Well controlled. Continue Candesartan 4mg  QD and Coreg 12.5mg  BID Patient endorses occasional lightheadedness when standing. Recommended she keep log of BP's particularly when symptomatic. She is to call if <100/70.  Follow up 1 month   History of tobacco abuse Due for lung cancer screen Low dose CT screen ordered  COPD with emphysema ([place]) Plan as above Will further inquire about symptoms at follow up visit  GERD (gastroesophageal reflux disease) Endorses primary symptoms when she gets upset. Continue Pepcid Try tums or  Gaviscon for breakthrough pain May need to consider PPI if no improvement  Orders Placed This Encounter  Procedures  . CT CHEST LUNG CA SCREEN LOW DOSE W/O CM    Standing Status:   Future    Standing Expiration Date:   04/12/2021    Order Specific Question:   Reason for Exam (SYMPTOM  OR DIAGNOSIS REQUIRED)     Answer:   screening lung cancer, prior smoking history, 25 pack year history    Order Specific Question:   Preferred Imaging Location?    Answer:   [facility]  . Ambulatory referral to Ophthalmology    Referral Priority:   Routine    Referral Type:   Consultation    Referral Reason:   Specialty Services Required    Requested Specialty:   Ophthalmology    Number of Visits Requested:   1  . POCT glycosylated hemoglobin (Hb A1C)   [name], DO PGY-3, [facility] 04/17/2020 8:00 AM

## 2020-04-13 ENCOUNTER — Ambulatory Visit (INDEPENDENT_AMBULATORY_CARE_PROVIDER_SITE_OTHER): Payer: Medicare HMO | Admitting: Family Medicine

## 2020-04-13 ENCOUNTER — Other Ambulatory Visit: Payer: Self-pay

## 2020-04-13 ENCOUNTER — Encounter: Payer: Self-pay | Admitting: Family Medicine

## 2020-04-13 VITALS — BP 112/64 | HR 105 | Ht 62.0 in | Wt 121.0 lb

## 2020-04-13 DIAGNOSIS — E1140 Type 2 diabetes mellitus with diabetic neuropathy, unspecified: Secondary | ICD-10-CM | POA: Diagnosis not present

## 2020-04-13 DIAGNOSIS — Z794 Long term (current) use of insulin: Secondary | ICD-10-CM | POA: Diagnosis not present

## 2020-04-13 DIAGNOSIS — E1165 Type 2 diabetes mellitus with hyperglycemia: Secondary | ICD-10-CM

## 2020-04-13 DIAGNOSIS — Z87891 Personal history of nicotine dependence: Secondary | ICD-10-CM | POA: Diagnosis not present

## 2020-04-13 DIAGNOSIS — K219 Gastro-esophageal reflux disease without esophagitis: Secondary | ICD-10-CM | POA: Diagnosis not present

## 2020-04-13 DIAGNOSIS — IMO0002 Reserved for concepts with insufficient information to code with codable children: Secondary | ICD-10-CM

## 2020-04-13 DIAGNOSIS — J439 Emphysema, unspecified: Secondary | ICD-10-CM | POA: Diagnosis not present

## 2020-04-13 DIAGNOSIS — I10 Essential (primary) hypertension: Secondary | ICD-10-CM

## 2020-04-13 DIAGNOSIS — H538 Other visual disturbances: Secondary | ICD-10-CM | POA: Diagnosis not present

## 2020-04-13 LAB — POCT GLYCOSYLATED HEMOGLOBIN (HGB A1C): Hemoglobin A1C: 9.8 % — AB (ref 4.0–5.6)

## 2020-04-13 NOTE — Progress Notes (Signed)
    S:   CGM Placement Patient arrives in good spirits, accompanied by her husband.  Presents for diabetes management and Dexcom G6 application.  Patient was referred and last seen by Primary Care Provider, Dr. Tarry Kos for assistance with CGM placement.  Patient reports blood glucose values ranging 100-200s. Patient denies hypoglycemic events.  Dexcom G6 patient education Patient oriented to three components of Dexcom G6 continuous glucose monitor (sensor, transmitter, receiver/cellphone) Receiver used for CGM management.   Patient was shown videos to demonstrate application and use of Dexcom. All patient questions were answered.   Dexcom G6 account email: [email or web address] Sensor code: [phone] Transmitter code: [id number]  CGM overview and set-up  1. Button, touch screen, and icons 2. Power supply and recharging 3. Home screen 4. Date and time 5. Set BG target range 6. Set alarm/alert tone with low and high values 7. Interstitial vs. capillary blood glucose readings  8. When to verify sensor reading with fingerstick blood glucose 9. Blood glucose reading measured every five minutes. 10. Sensor will last 10 days 11. Transmitter will last 90 days and must be reused  12. Transmitter must be within 20 feet of receiver/cell phone.  Sensor application -- sensor placed on right lower abdomen 1. Site selection and site prep with alcohol pad 2. Sensor prep-sensor pack and sensor applicator 3. Sensor applied to area away from waistband, scarring, tattoos, irritation, and bones 4. Transmitter sanitized with alcohol pad and inserted into sensor. 5. Starting the sensor: 2 hour warm up before BG readings available 6. Sensor change every 10 days and rotate site 7. Call Dexcom customer service if sensor comes off before 10 days  Safety and Troubleshooting 1. Do a fingerstick blood glucose test if the sensor readings do not match how    you feel 2. Remove sensor prior to magnetic resonance  imaging (MRI), computed tomography (CT) scan, or high-frequency electrical heat (diathermy) treatment. 3. Do not allow sun screen or insect repellant to come into contact with Dexcom G6. These skin care products may lead for the plastic used in the Dexcom G6 to crack. 4. Dexcom G6 may be worn through a Environmental education officer. It may not be exposed to an advanced Imaging Technology (AIT) body scanner (also called a millimeter wave scanner) or the baggage x-ray machine. Instead, ask for hand-wanding or full-body pat-down and visual inspection.  5. Doses of acetaminophen (Tylenol) >1 gram every 6 hours may cause false high readings. 6. Hydroxyurea (Hydrea, Droxia) may interfere with accuracy of blood glucose readings from Dexcom G6. 7. Store sensor kit between 36 and 86 degrees Farenheit. Can be refrigerated within this temperature range.  Contact information provided for [facility] customer service and/or trainer.   Lab Results  Component Value Date   HGBA1C 12.9 (A) 12/01/2019   Lipid Panel     Component Value Date/Time   CHOL 304 (H) 12/01/2019 1651   TRIG 164 (H) 12/01/2019 1651   HDL 64 12/01/2019 1651   CHOLHDL 4.8 (H) 12/01/2019 1651   CHOLHDL 3.5 07/23/2018 1122   VLDL 18 07/23/2018 1122   LDLCALC 209 (H) 12/01/2019 1651   LDLDIRECT 237 (H) 06/25/2017 1337   LDLDIRECT 101 (H) 05/31/2013 1421    Written patient instructions provided.  Total time in face to face counseling 30 minutes.   Follow up Pharmacist phone call in one week.   Patient seen with Mercy Riding, PharmD, PGY1 Acute Care Resident

## 2020-04-13 NOTE — Patient Instructions (Addendum)
It was a pleasure to see you today!  Thank you for choosing [facility] for your primary care.  [name] was seen for diabetes follow up   Our plans for today were:  I will talk with Dr. Valentina Lucks about the Victoza dosing  I have placed referral to eye doctor for further evaluation  Lets follow up in 1 month to follow up blood sugars and blood pressures  Keep a log of blood pressure/blood sugars  Try tums or Gaviscon when you get the pain in your stomach.   To keep you healthy, please keep in mind the following health maintenance items that you are due for:   1. CT scan for lung cancer screen    You should return to our clinic in 1 month for follow up.   Best Wishes,   [name], DO

## 2020-04-17 NOTE — Assessment & Plan Note (Signed)
Plan as above Will further inquire about symptoms at follow up visit

## 2020-04-17 NOTE — Assessment & Plan Note (Signed)
Well controlled. Continue Candesartan 4mg  QD and Coreg 12.5mg  BID Patient endorses occasional lightheadedness when standing. Recommended she keep log of BP's particularly when symptomatic. She is to call if <100/70.  Follow up 1 month

## 2020-04-17 NOTE — Assessment & Plan Note (Addendum)
Chronic, uncontrolled but improving. Dexcom training today. Improved blood sugars with only one reported hypoglycemic episode. Does endorse some nausea with the Victoza with meals. Will discuss with pharmacy team regarding lowering dose vs continuing current meds.  - continue current meds - follow up in 1 month  - patient instructed to keep a log of blood pressure/blood sugars - due for diabetic eye exam - referral placed and list mailed to patient

## 2020-04-17 NOTE — Assessment & Plan Note (Signed)
Due for lung cancer screen Low dose CT screen ordered

## 2020-04-17 NOTE — Assessment & Plan Note (Signed)
Endorses primary symptoms when she gets upset. Continue Pepcid Try tums or Gaviscon for breakthrough pain May need to consider PPI if no improvement

## 2020-04-18 ENCOUNTER — Telehealth: Payer: Self-pay | Admitting: Pharmacist

## 2020-04-18 ENCOUNTER — Telehealth: Payer: Self-pay

## 2020-04-18 NOTE — Telephone Encounter (Signed)
Noted and agree. 

## 2020-04-18 NOTE — Telephone Encounter (Signed)
Called patient with CT appointment  [facility] [address] [place], [place] [address] [phone]  04/24/2020 1100 am 1045 arrival  Patient verbalized understanding.  Ozella Almond, [place]

## 2020-04-18 NOTE — Telephone Encounter (Signed)
Contacted patient RE her blood sugar control.  Patient reports her readings in the 100s and 200s.  She is happy with the CGM - Dexcom.   Patient reports currently using Tresiba 22 units once daily and Novolog 4,6, or 8 units on sliding scalle.  Patient is also taking Victoza (liraglutide) 0.6mg  daily.   The complaint of GI pain and symptoms recently reported to PCP, Dr. Tarry Kos were discussed.  We agreed to do a trial OFF of Victoza (advised to stop therapy today and through next week when I plan to contact her again by phone).

## 2020-04-24 ENCOUNTER — Ambulatory Visit: Payer: Medicare HMO

## 2020-04-24 ENCOUNTER — Other Ambulatory Visit: Payer: Self-pay

## 2020-04-26 ENCOUNTER — Telehealth: Payer: Self-pay | Admitting: Pharmacist

## 2020-04-26 DIAGNOSIS — IMO0002 Reserved for concepts with insufficient information to code with codable children: Secondary | ICD-10-CM

## 2020-04-26 DIAGNOSIS — E1140 Type 2 diabetes mellitus with diabetic neuropathy, unspecified: Secondary | ICD-10-CM

## 2020-04-26 NOTE — Telephone Encounter (Addendum)
Contacted patient to assess GI pain while off Victoza. Patient reports she experienced mild GI pain after stopping Victoza and is unsure if Victoza is the cause. We agreed to restart Victoza 0.6 mg daily and monitor symptoms. Will call in 1 week to reassess symptoms.   Patient unable to recall BG readings as she was not home, but reports happiness that CGM alerts her of lows/highs which allows her to correct sugars.

## 2020-04-26 NOTE — Telephone Encounter (Signed)
Noted and agree. 

## 2020-04-27 IMAGING — CR DG CHEST 2V
2 series · 2 of 2 positions shown · non-contrast
Comparison: 10/30/2015

CLINICAL DATA: Shortness of breath for 2 weeks.

EXAM:
CHEST - 2 VIEW

[w chest pa]
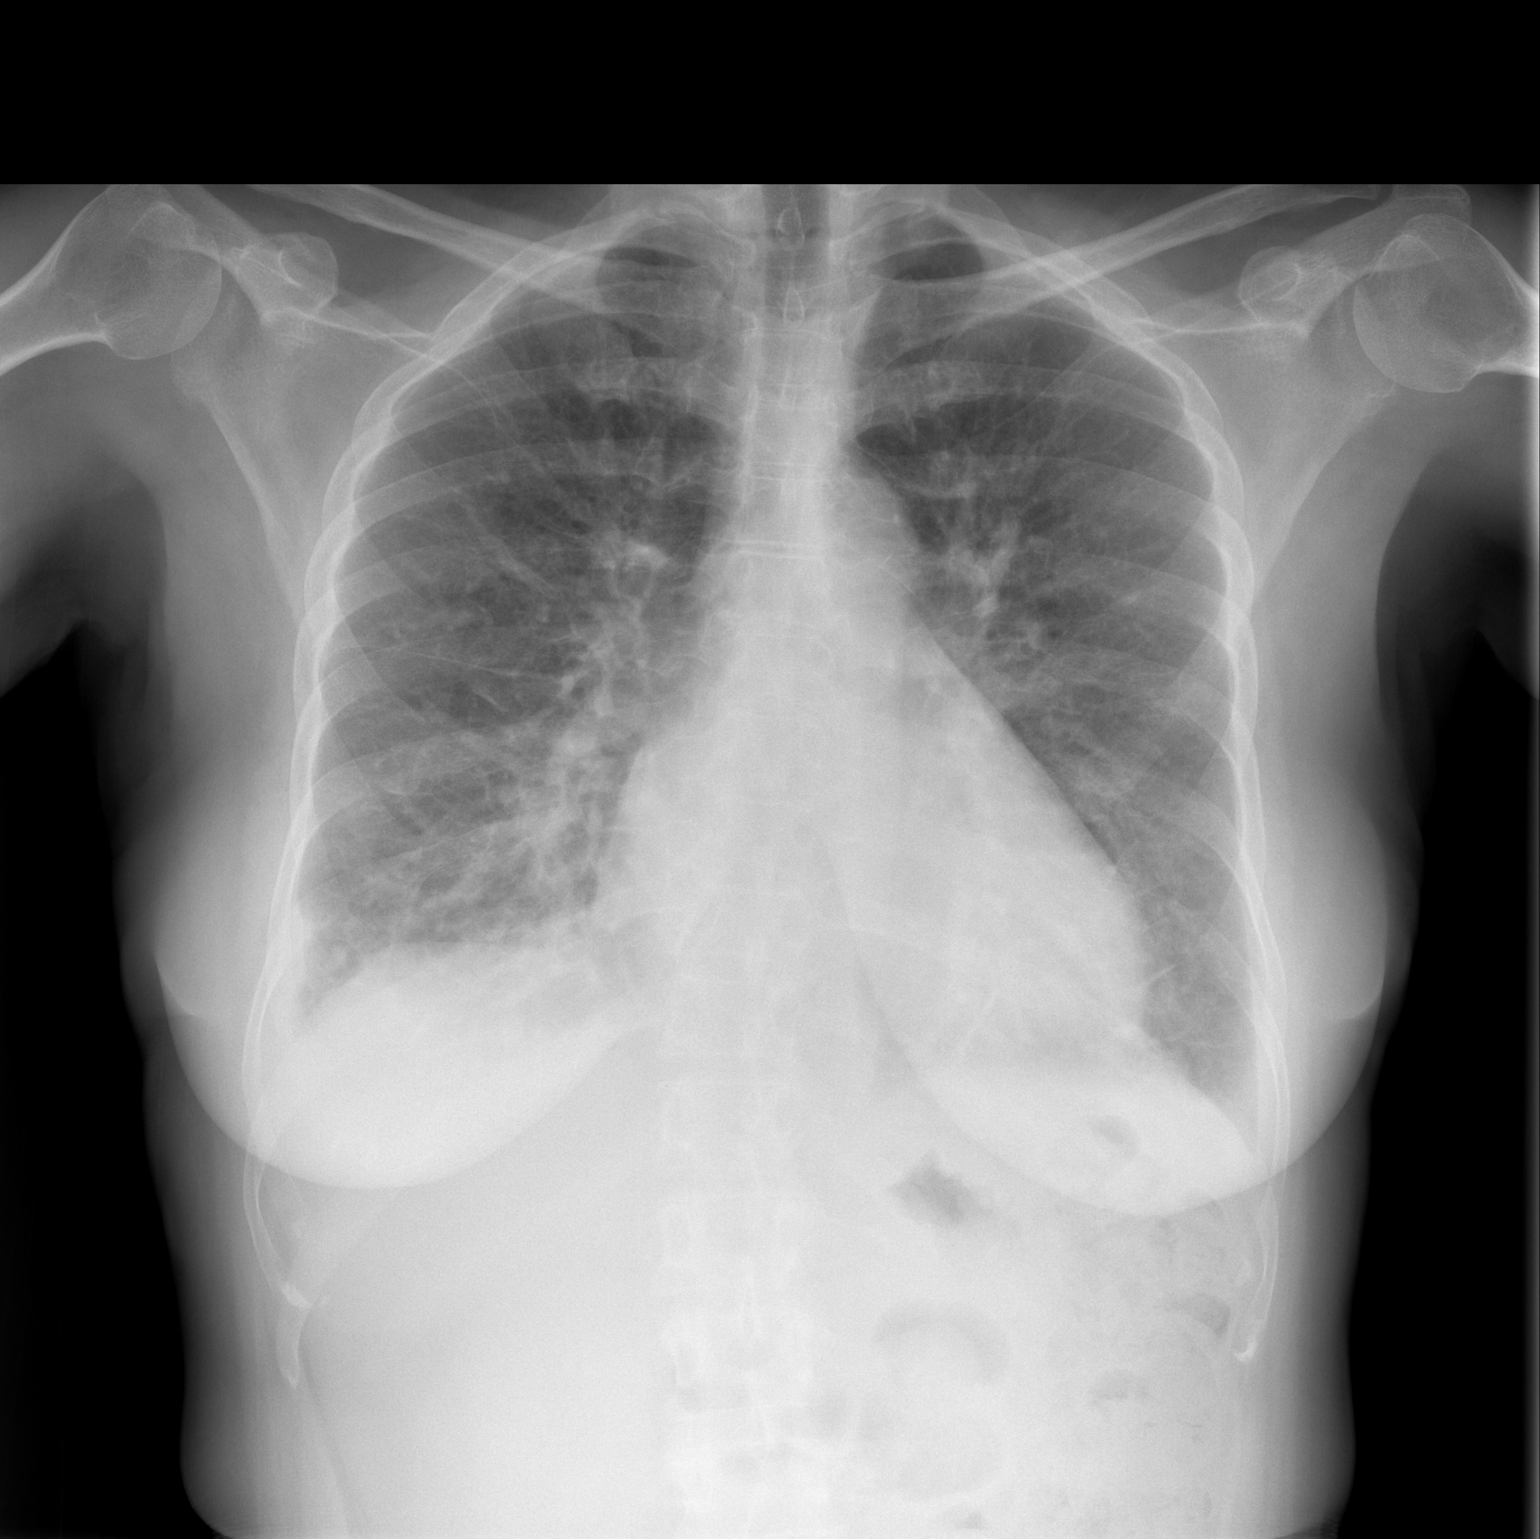

[w chest lat]
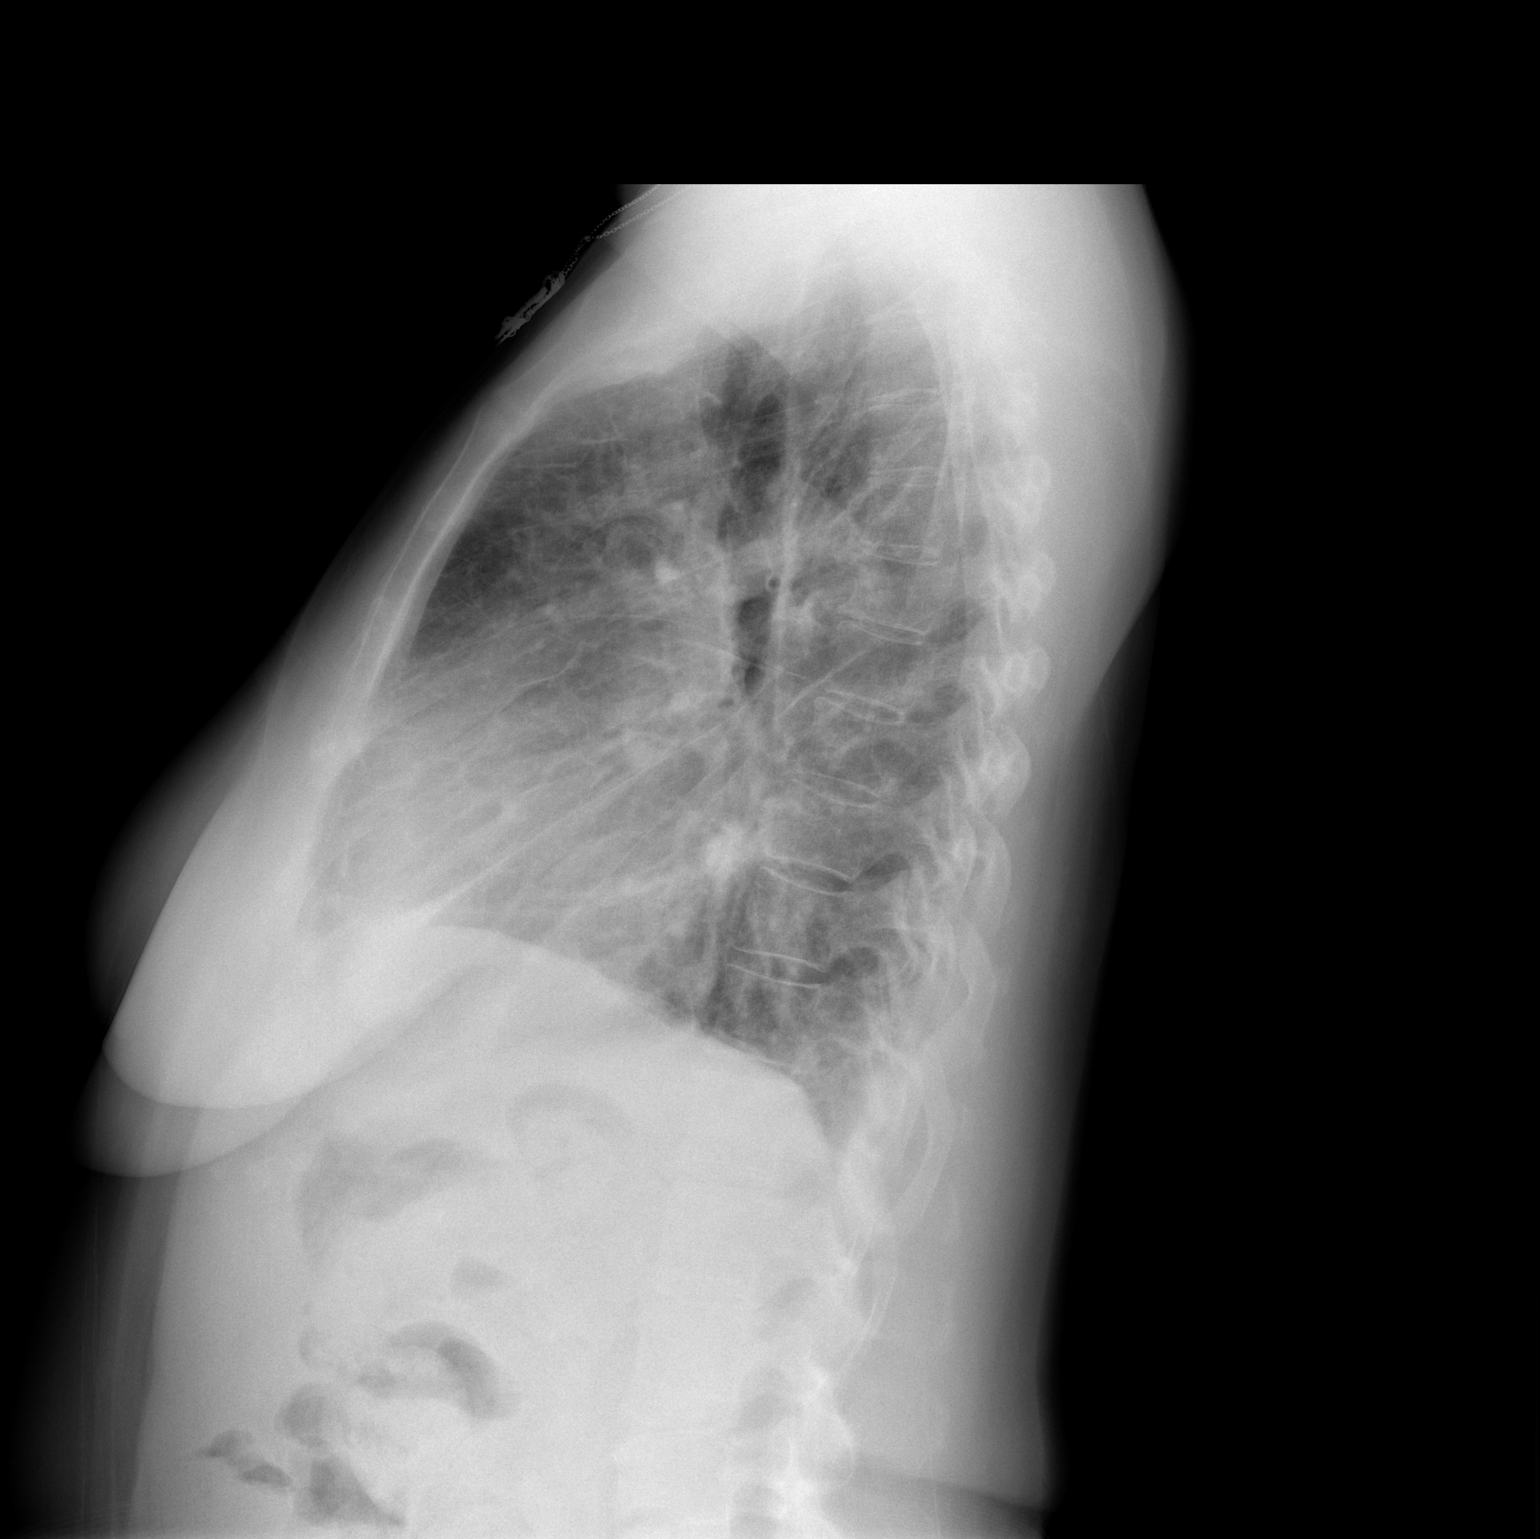

[2 of 2 positions shown; findings below may reference images not displayed]

FINDINGS: Borderline heart size with mild pulmonary vascular congestion.
Interstitial infiltrates in the lung bases suggest edema or
interstitial pneumonitis. Small bilateral pleural effusions. No
pneumothorax. Mediastinal contours appear intact.
IMPRESSION: Borderline heart size with mild pulmonary vascular congestion and
interstitial edema. Small bilateral pleural effusions.

## 2020-04-27 IMAGING — CT CT ANGIO CHEST
2 of 8 series · 18 of 36 positions shown · IV contrast (iopamidol)
Comparison: 02/05/2017

CLINICAL DATA: Shortness of breath and mild chest discomfort for 5
days. Positive D-dimer. History of hypertension, diabetes,
gastroesophageal reflux disease, tobacco use.

EXAM:
CT ANGIOGRAPHY CHEST WITH CONTRAST
TECHNIQUE: Multidetector CT imaging of the chest was performed using the
standard protocol during bolus administration of intravenous
contrast. Multiplanar CT image reconstructions and MIPs were
obtained to evaluate the vascular anatomy.
CONTRAST:  100mL V2IGPJ-4TA IOPAMIDOL (V2IGPJ-4TA) INJECTION 76%

[Series 6: pe thins · axial · 0.63mm/px · z∈[-229,+17]mm · 17 of 276 slices shown]
[im 15/276  lung]
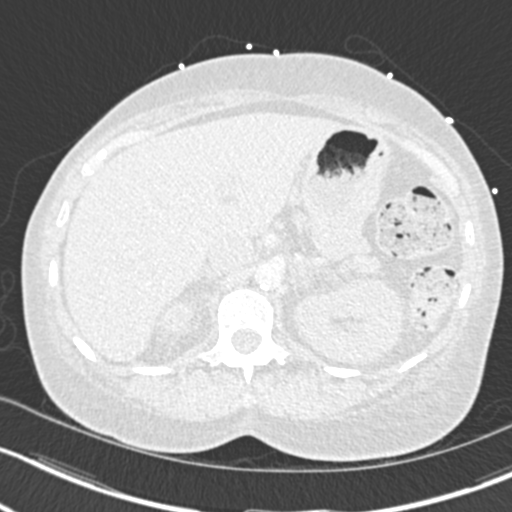
[im 29/276  mediastinal]
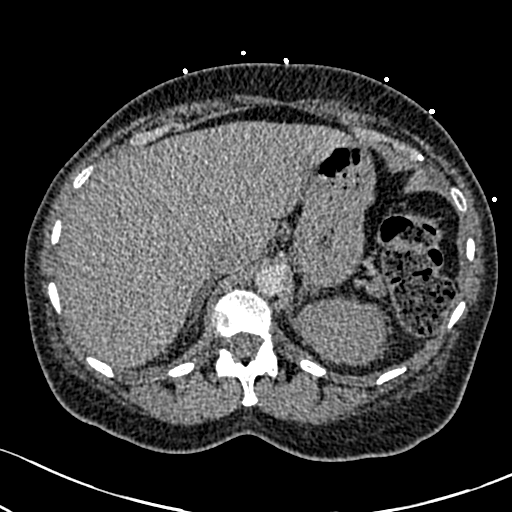
[im 44/276  lung]
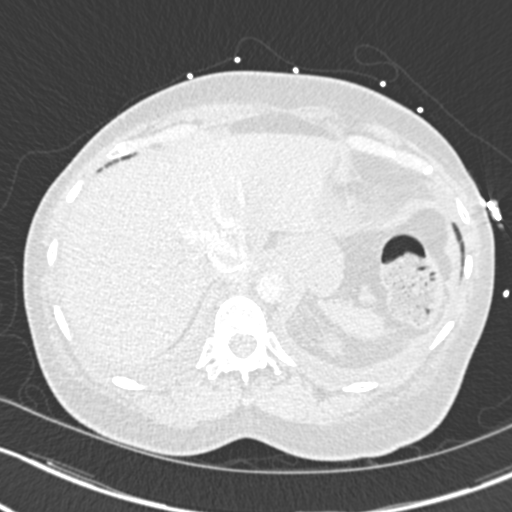
[im 58/276  mediastinal]
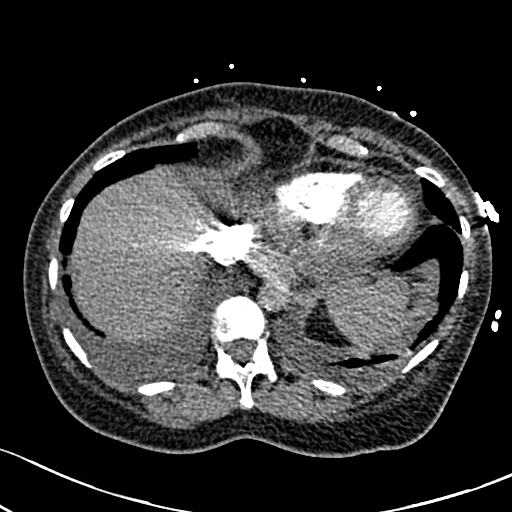
[im 73/276  lung]
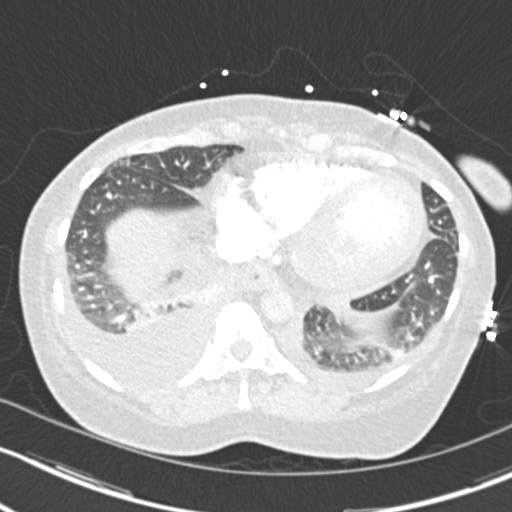
[im 87/276  mediastinal]
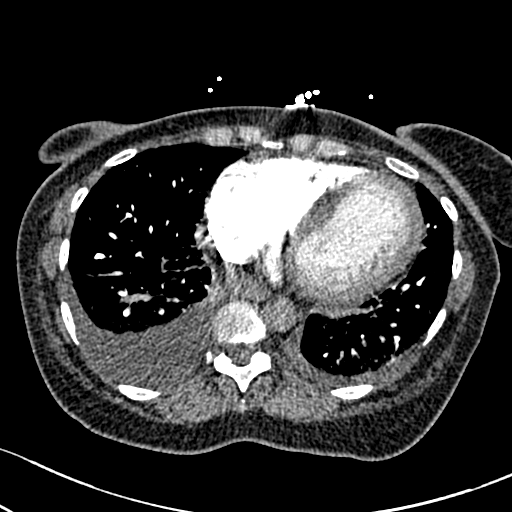
[im 102/276  lung]
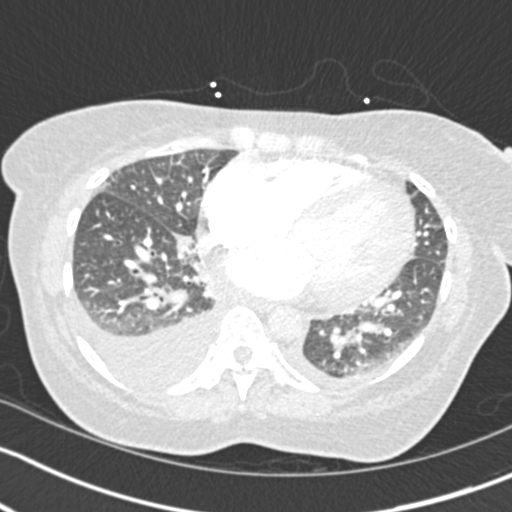
[im 116/276  mediastinal]
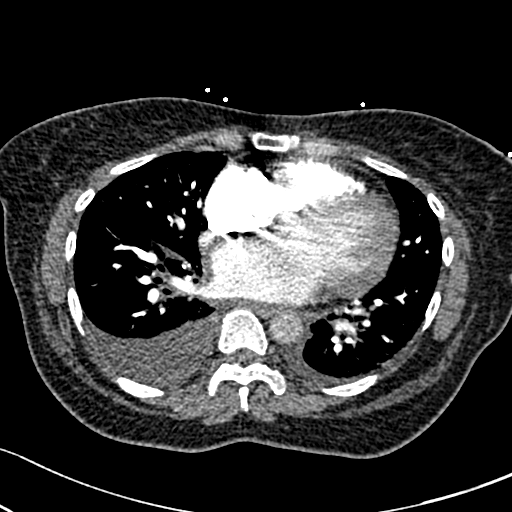
[im 145/276  lung]
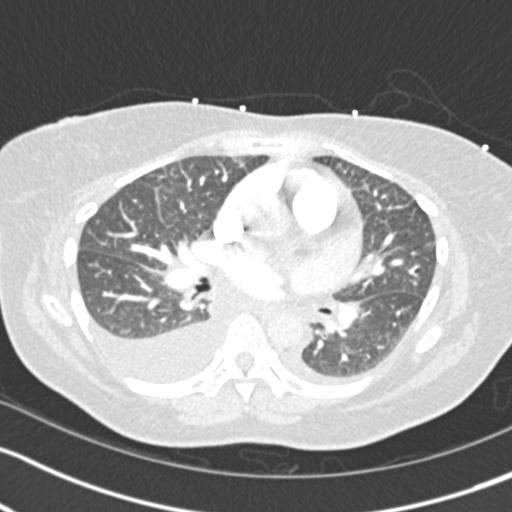
[im 160/276  mediastinal]
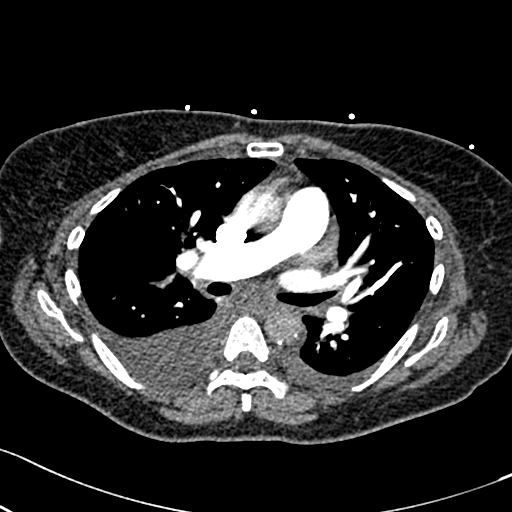
[im 174/276  lung]
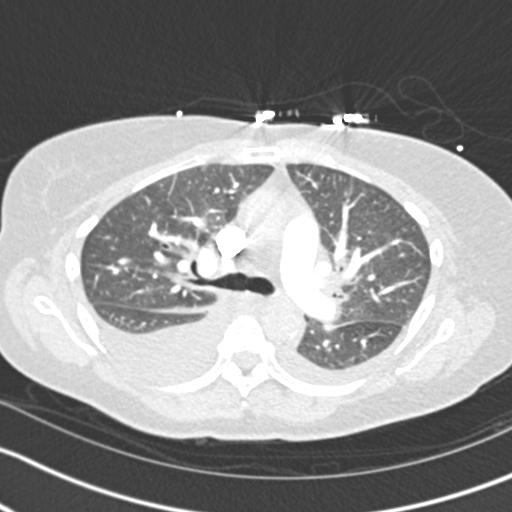
[im 189/276  mediastinal]
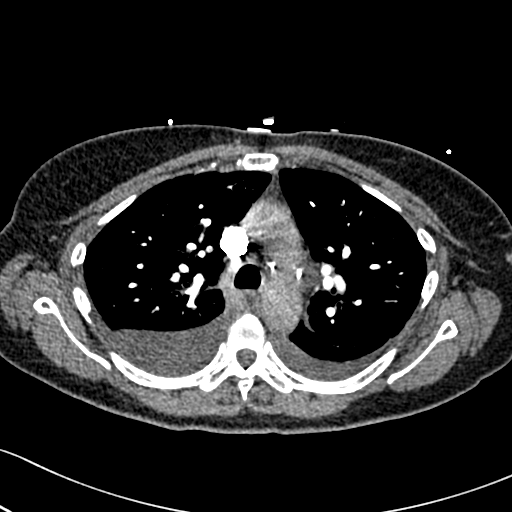
[im 203/276  lung]
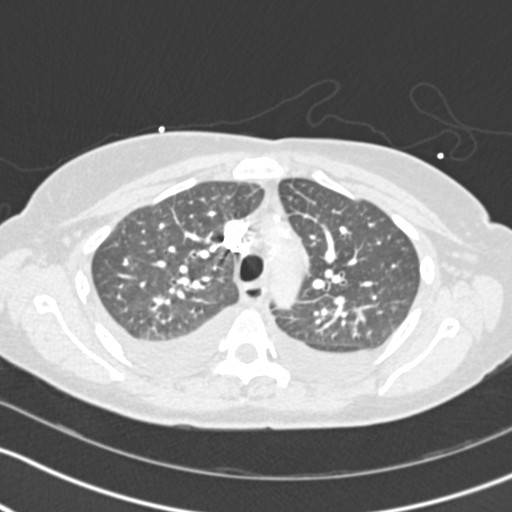
[im 218/276  mediastinal]
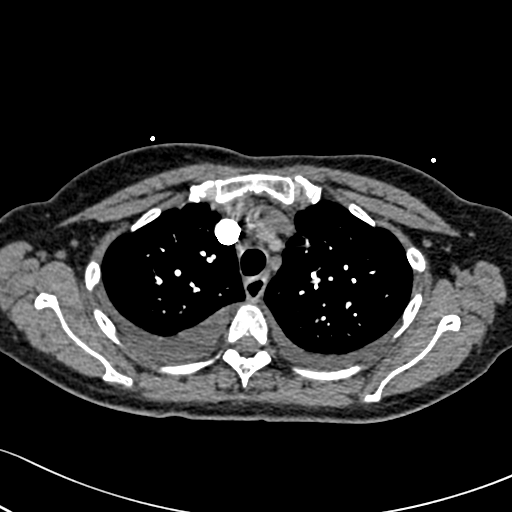
[im 232/276  lung]
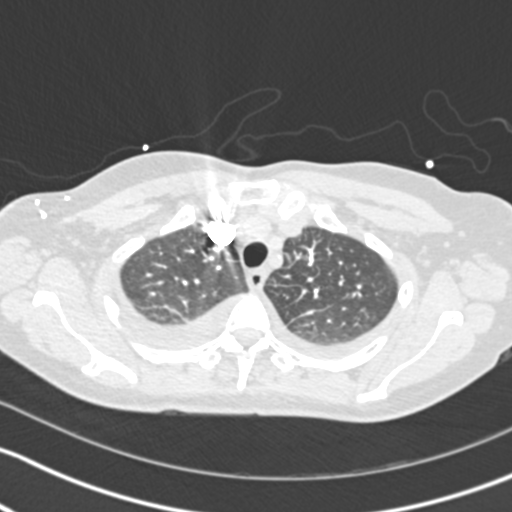
[im 247/276  mediastinal]
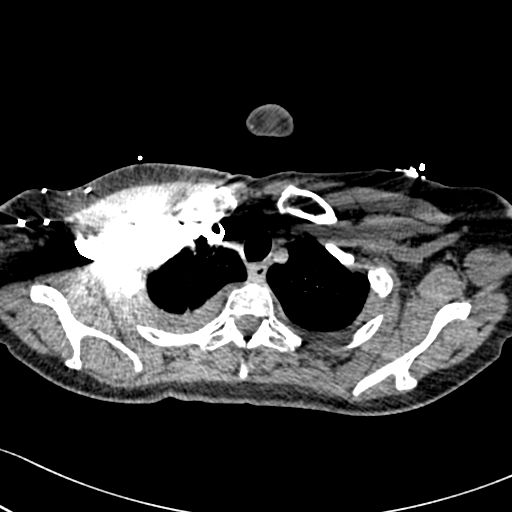
[im 261/276  lung]
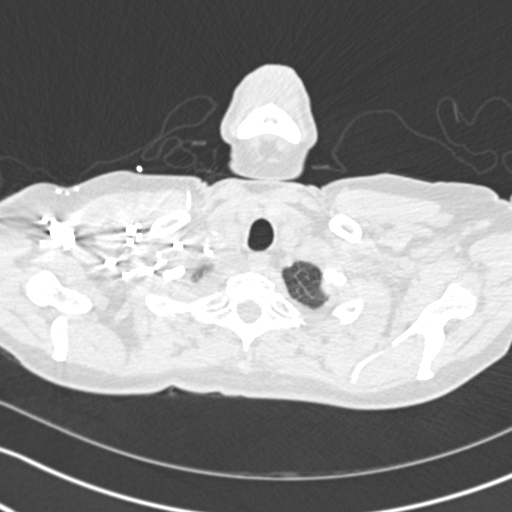

[Series 7: pe coronal mpr · coronal · 0.60mm/px · 1 of 128 slices shown]
[im 64/128  mediastinal]
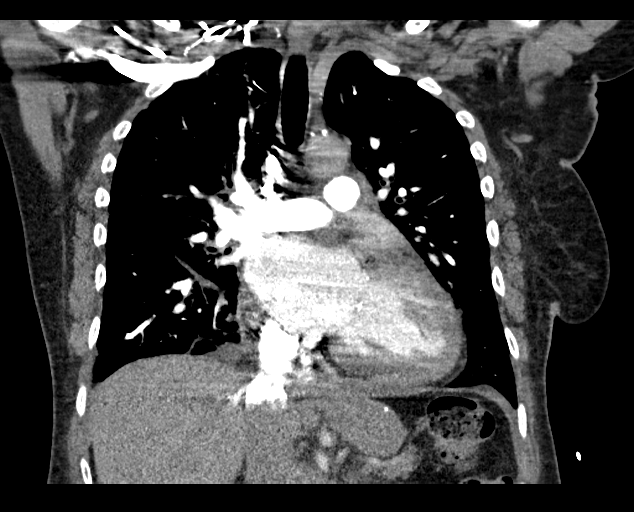

[18 of 36 positions shown; findings below may reference images not displayed]

FINDINGS: Cardiovascular: Good opacification of the central and segmental
pulmonary arteries. No focal filling defects. No evidence of
significant pulmonary embolus. Normal caliber thoracic aorta with
scattered calcifications. Cardiac enlargement with left heart
prominence. No pericardial effusions. Coronary artery
calcifications.

Mediastinum/Nodes: Esophagus is decompressed. No significant
lymphadenopathy in the chest.

Lungs/Pleura: Moderate bilateral pleural effusions, greater on the
right. Atelectasis or infiltration in the lung bases. Mild
interstitial and perihilar infiltration likely due to edema. No
pneumothorax. Airways are patent.

Upper Abdomen: Reflux of contrast material into the inferior vena
cava may indicate right heart failure.

Musculoskeletal: No chest wall abnormality. No acute or significant
osseous findings.

Review of the MIP images confirms the above findings.
IMPRESSION: 1. No evidence of significant pulmonary embolus.
2. Cardiac enlargement with bilateral pleural effusions, basilar
atelectasis, and perihilar infiltration, likely due to edema.

Aortic Atherosclerosis ([id number]).

## 2020-04-28 DIAGNOSIS — E118 Type 2 diabetes mellitus with unspecified complications: Secondary | ICD-10-CM | POA: Diagnosis not present

## 2020-04-30 ENCOUNTER — Other Ambulatory Visit: Payer: Self-pay

## 2020-04-30 DIAGNOSIS — IMO0002 Reserved for concepts with insufficient information to code with codable children: Secondary | ICD-10-CM

## 2020-04-30 DIAGNOSIS — E1140 Type 2 diabetes mellitus with diabetic neuropathy, unspecified: Secondary | ICD-10-CM

## 2020-04-30 MED ORDER — DEXCOM G6 SENSOR MISC: 1.0000 | 3 refills | Status: AC

## 2020-05-01 DIAGNOSIS — J9601 Acute respiratory failure with hypoxia: Secondary | ICD-10-CM | POA: Diagnosis not present

## 2020-05-01 DIAGNOSIS — I5043 Acute on chronic combined systolic (congestive) and diastolic (congestive) heart failure: Secondary | ICD-10-CM | POA: Diagnosis not present

## 2020-05-01 DIAGNOSIS — E876 Hypokalemia: Secondary | ICD-10-CM | POA: Diagnosis not present

## 2020-05-01 DIAGNOSIS — I110 Hypertensive heart disease with heart failure: Secondary | ICD-10-CM | POA: Diagnosis not present

## 2020-05-01 DIAGNOSIS — R0602 Shortness of breath: Secondary | ICD-10-CM | POA: Diagnosis not present

## 2020-05-01 DIAGNOSIS — J449 Chronic obstructive pulmonary disease, unspecified: Secondary | ICD-10-CM | POA: Diagnosis not present

## 2020-05-01 DIAGNOSIS — E1122 Type 2 diabetes mellitus with diabetic chronic kidney disease: Secondary | ICD-10-CM | POA: Diagnosis not present

## 2020-05-01 DIAGNOSIS — E1169 Type 2 diabetes mellitus with other specified complication: Secondary | ICD-10-CM | POA: Diagnosis not present

## 2020-05-01 DIAGNOSIS — R918 Other nonspecific abnormal finding of lung field: Secondary | ICD-10-CM | POA: Diagnosis not present

## 2020-05-01 DIAGNOSIS — I083 Combined rheumatic disorders of mitral, aortic and tricuspid valves: Secondary | ICD-10-CM | POA: Diagnosis not present

## 2020-05-01 DIAGNOSIS — I517 Cardiomegaly: Secondary | ICD-10-CM | POA: Diagnosis not present

## 2020-05-01 DIAGNOSIS — E785 Hyperlipidemia, unspecified: Secondary | ICD-10-CM | POA: Diagnosis not present

## 2020-05-01 DIAGNOSIS — I509 Heart failure, unspecified: Secondary | ICD-10-CM | POA: Diagnosis not present

## 2020-05-01 DIAGNOSIS — I130 Hypertensive heart and chronic kidney disease with heart failure and stage 1 through stage 4 chronic kidney disease, or unspecified chronic kidney disease: Secondary | ICD-10-CM | POA: Diagnosis not present

## 2020-05-01 DIAGNOSIS — K219 Gastro-esophageal reflux disease without esophagitis: Secondary | ICD-10-CM | POA: Diagnosis not present

## 2020-05-01 DIAGNOSIS — R69 Illness, unspecified: Secondary | ICD-10-CM | POA: Diagnosis not present

## 2020-05-01 DIAGNOSIS — I10 Essential (primary) hypertension: Secondary | ICD-10-CM | POA: Diagnosis not present

## 2020-05-01 DIAGNOSIS — I428 Other cardiomyopathies: Secondary | ICD-10-CM | POA: Diagnosis not present

## 2020-05-01 DIAGNOSIS — N1832 Chronic kidney disease, stage 3b: Secondary | ICD-10-CM | POA: Diagnosis not present

## 2020-05-01 DIAGNOSIS — R0600 Dyspnea, unspecified: Secondary | ICD-10-CM | POA: Diagnosis not present

## 2020-05-01 DIAGNOSIS — N179 Acute kidney failure, unspecified: Secondary | ICD-10-CM | POA: Diagnosis not present

## 2020-05-01 DIAGNOSIS — I5189 Other ill-defined heart diseases: Secondary | ICD-10-CM | POA: Diagnosis not present

## 2020-05-01 DIAGNOSIS — Z794 Long term (current) use of insulin: Secondary | ICD-10-CM | POA: Diagnosis not present

## 2020-05-03 ENCOUNTER — Telehealth: Payer: Self-pay | Admitting: Pharmacist

## 2020-05-03 DIAGNOSIS — E1140 Type 2 diabetes mellitus with diabetic neuropathy, unspecified: Secondary | ICD-10-CM

## 2020-05-03 DIAGNOSIS — IMO0002 Reserved for concepts with insufficient information to code with codable children: Secondary | ICD-10-CM

## 2020-05-03 NOTE — Telephone Encounter (Signed)
Contacted patient to assess for side-effects after restarting Victoza. Patient reports she is currently admitted to [facility] and being treated for fluid overload and shortness of breath secondary to CHF exacerbation. Will follow-up with patient in 2-3 weeks regarding diabetes management.

## 2020-05-08 DIAGNOSIS — I951 Orthostatic hypotension: Secondary | ICD-10-CM | POA: Diagnosis not present

## 2020-05-08 DIAGNOSIS — I130 Hypertensive heart and chronic kidney disease with heart failure and stage 1 through stage 4 chronic kidney disease, or unspecified chronic kidney disease: Secondary | ICD-10-CM | POA: Diagnosis not present

## 2020-05-08 DIAGNOSIS — E1122 Type 2 diabetes mellitus with diabetic chronic kidney disease: Secondary | ICD-10-CM | POA: Diagnosis not present

## 2020-05-08 DIAGNOSIS — I5042 Chronic combined systolic (congestive) and diastolic (congestive) heart failure: Secondary | ICD-10-CM | POA: Diagnosis not present

## 2020-05-08 DIAGNOSIS — R0789 Other chest pain: Secondary | ICD-10-CM | POA: Diagnosis not present

## 2020-05-08 DIAGNOSIS — R42 Dizziness and giddiness: Secondary | ICD-10-CM | POA: Diagnosis not present

## 2020-05-08 DIAGNOSIS — R69 Illness, unspecified: Secondary | ICD-10-CM | POA: Diagnosis not present

## 2020-05-08 DIAGNOSIS — J439 Emphysema, unspecified: Secondary | ICD-10-CM | POA: Diagnosis not present

## 2020-05-08 DIAGNOSIS — N178 Other acute kidney failure: Secondary | ICD-10-CM | POA: Diagnosis not present

## 2020-05-08 DIAGNOSIS — I10 Essential (primary) hypertension: Secondary | ICD-10-CM | POA: Diagnosis not present

## 2020-05-08 DIAGNOSIS — N179 Acute kidney failure, unspecified: Secondary | ICD-10-CM | POA: Diagnosis not present

## 2020-05-08 DIAGNOSIS — I083 Combined rheumatic disorders of mitral, aortic and tricuspid valves: Secondary | ICD-10-CM | POA: Diagnosis not present

## 2020-05-08 DIAGNOSIS — R079 Chest pain, unspecified: Secondary | ICD-10-CM | POA: Diagnosis not present

## 2020-05-08 DIAGNOSIS — I428 Other cardiomyopathies: Secondary | ICD-10-CM | POA: Diagnosis not present

## 2020-05-08 DIAGNOSIS — E785 Hyperlipidemia, unspecified: Secondary | ICD-10-CM | POA: Diagnosis not present

## 2020-05-08 DIAGNOSIS — R7989 Other specified abnormal findings of blood chemistry: Secondary | ICD-10-CM | POA: Diagnosis not present

## 2020-05-09 ENCOUNTER — Telehealth: Payer: Self-pay | Admitting: Licensed Clinical Social Worker

## 2020-05-09 DIAGNOSIS — R42 Dizziness and giddiness: Secondary | ICD-10-CM | POA: Diagnosis not present

## 2020-05-09 DIAGNOSIS — R7989 Other specified abnormal findings of blood chemistry: Secondary | ICD-10-CM | POA: Diagnosis not present

## 2020-05-09 DIAGNOSIS — I5042 Chronic combined systolic (congestive) and diastolic (congestive) heart failure: Secondary | ICD-10-CM | POA: Diagnosis not present

## 2020-05-09 DIAGNOSIS — N179 Acute kidney failure, unspecified: Secondary | ICD-10-CM | POA: Diagnosis not present

## 2020-05-09 DIAGNOSIS — E119 Type 2 diabetes mellitus without complications: Secondary | ICD-10-CM | POA: Diagnosis not present

## 2020-05-09 DIAGNOSIS — I10 Essential (primary) hypertension: Secondary | ICD-10-CM | POA: Diagnosis not present

## 2020-05-09 DIAGNOSIS — Z794 Long term (current) use of insulin: Secondary | ICD-10-CM | POA: Diagnosis not present

## 2020-05-09 DIAGNOSIS — J431 Panlobular emphysema: Secondary | ICD-10-CM | POA: Diagnosis not present

## 2020-05-09 DIAGNOSIS — I951 Orthostatic hypotension: Secondary | ICD-10-CM | POA: Diagnosis not present

## 2020-05-09 NOTE — Chronic Care Management (AMB) (Signed)
    Clinical Social Work  Care Management Outreach   05/09/2020 Name: [name] MRN: [id number] DOB: [birth year]  [name] is a 64 y.o. year old female who is a primary care patient of [name], DO . Received voice message from patient asking for return call.  Return phone call to [name] today to assess needs, and progress with care plan goals.  The outreach was unsuccessful.  A HIPPA compliant phone message was left for the patient providing contact information and requesting a return call.   LCSW noticed patient was seen in ED yesterday  Plan: If no return call is received, will call again in 7 to 10 days.  Review of patient status, including review of consultants reports, relevant laboratory and other test results, and collaboration with appropriate care team members and the patient's provider was performed as part of comprehensive patient evaluation and provision of care management services.    [name], [place] / [place]   [phone] 8:45 AM

## 2020-05-11 DIAGNOSIS — I428 Other cardiomyopathies: Secondary | ICD-10-CM | POA: Diagnosis not present

## 2020-05-11 DIAGNOSIS — I10 Essential (primary) hypertension: Secondary | ICD-10-CM | POA: Diagnosis not present

## 2020-05-11 DIAGNOSIS — I340 Nonrheumatic mitral (valve) insufficiency: Secondary | ICD-10-CM | POA: Diagnosis not present

## 2020-05-11 DIAGNOSIS — I2510 Atherosclerotic heart disease of native coronary artery without angina pectoris: Secondary | ICD-10-CM | POA: Diagnosis not present

## 2020-05-17 ENCOUNTER — Telehealth: Payer: Self-pay | Admitting: Pharmacist

## 2020-05-17 NOTE — Telephone Encounter (Signed)
Left HIPAA compliant voicemail requesting call back to assess for side-effects after restarting Victoza for diabetes management.  Will call again in 2 weeks.

## 2020-05-19 ENCOUNTER — Other Ambulatory Visit: Payer: Self-pay | Admitting: Family Medicine

## 2020-05-19 DIAGNOSIS — Z87891 Personal history of nicotine dependence: Secondary | ICD-10-CM

## 2020-05-21 ENCOUNTER — Ambulatory Visit: Payer: Medicare HMO | Admitting: Family Medicine

## 2020-05-23 ENCOUNTER — Telehealth: Payer: Self-pay | Admitting: Licensed Clinical Social Worker

## 2020-05-23 NOTE — Chronic Care Management (AMB) (Signed)
    Clinical Social Work  Care Management Outreach   05/23/2020 Name: [name] MRN: [id number] DOB: 09-28-1955  [name] is a 64 y.o. year old female who is a primary care patient of [name], DO .    2nd F/U phone call to [name] today to assess needs, and progress with care plan goals.  The outreach was unsuccessful. A HIPPA compliant phone message was left for the patient providing contact information and requesting a return call.   Plan: LCSW will wait for return call.  If no return call is received, will call again in 45 to 60 days.  Review of patient status, including review of consultants reports, relevant laboratory and other test results, and collaboration with appropriate care team members and the patient's provider was performed as part of comprehensive patient evaluation and provision of care management services.    [name], [place] / [place]   [phone] 1:53 PM

## 2020-05-24 ENCOUNTER — Telehealth: Payer: Self-pay | Admitting: Pharmacist

## 2020-05-24 DIAGNOSIS — E1140 Type 2 diabetes mellitus with diabetic neuropathy, unspecified: Secondary | ICD-10-CM

## 2020-05-24 DIAGNOSIS — E1165 Type 2 diabetes mellitus with hyperglycemia: Secondary | ICD-10-CM

## 2020-05-24 DIAGNOSIS — IMO0002 Reserved for concepts with insufficient information to code with codable children: Secondary | ICD-10-CM

## 2020-05-24 MED ORDER — INSULIN ASPART 100 UNIT/ML ~~LOC~~ SOLN: 4.0000 [IU] | Freq: Three times a day (TID) | SUBCUTANEOUS | Status: AC

## 2020-05-24 MED ORDER — TRESIBA FLEXTOUCH 100 UNIT/ML ~~LOC~~ SOPN: 16.0000 [IU] | PEN_INJECTOR | Freq: Every day | SUBCUTANEOUS | Status: AC

## 2020-05-24 NOTE — Telephone Encounter (Signed)
Patient returned call to our office following phone message yesterday to assess glucose control.   Patient reports that she is up 10# today and is planning to talk to the CHF cardiology team today.   Patient also reports highly variable blood sugars including low readings after meals, especially at night, which have been as low as 48 last night.   She reports adherence to dapagliflozin, liraglutide, insulin degludec and insulin aspart.   Following some discussion of blood sugar variability we agreed to  Reduce Tresiba from 20 units daily to 16 units daily.  Reduce mealtime insulin dose to 4 units prior to meals.  (Advised to no longer take doses > 4 units).  We discussed short-term goal of avoiding all low readings if possible.  She notes that her Dexcom is letting her know prior to experiencing low readings and has only infrequently become symptomatically low.   I encouraged her to continue to work on health intake of meals throughout the entire day (stressed that avoid meals was not ideal).   Patient verbalized understanding of changes and details of treatment plan.   I will plan to call her again in 1 week.

## 2020-05-24 NOTE — Telephone Encounter (Signed)
Noted and agree. 

## 2020-05-28 DIAGNOSIS — E118 Type 2 diabetes mellitus with unspecified complications: Secondary | ICD-10-CM | POA: Diagnosis not present

## 2020-05-30 ENCOUNTER — Telehealth: Payer: Self-pay | Admitting: Pharmacist

## 2020-05-30 NOTE — Telephone Encounter (Signed)
Contacted to follow-up on blood sugars AND CGM use.   Patient states that her blood sugars are under good control - "fewer highs and lows".  She reports the alarm features let her know before her blood sugars are significantly out of range.   We agreed to make no changes today. A follow-up plan for phone follow-up in 3 weeks was acceptable to the patient.

## 2020-05-30 NOTE — Telephone Encounter (Signed)
Noted and agree. 

## 2020-06-01 DIAGNOSIS — I429 Cardiomyopathy, unspecified: Secondary | ICD-10-CM | POA: Diagnosis not present

## 2020-06-01 DIAGNOSIS — I10 Essential (primary) hypertension: Secondary | ICD-10-CM | POA: Diagnosis not present

## 2020-06-01 DIAGNOSIS — I5020 Unspecified systolic (congestive) heart failure: Secondary | ICD-10-CM | POA: Diagnosis not present

## 2020-06-01 DIAGNOSIS — R0602 Shortness of breath: Secondary | ICD-10-CM | POA: Diagnosis not present

## 2020-06-06 ENCOUNTER — Telehealth: Payer: Self-pay | Admitting: *Deleted

## 2020-06-06 NOTE — Telephone Encounter (Signed)
Please inform Crystal that I received a message from [place] stating the below   [place], [employer], [employer], DO New criteria for lung ca screening is 50-65yo w/20py hx.  If you still want pt to have the ct, we will schedule it when a new order and Josem Kaufmann is put in.   Thx,  Apolonio Schneiders    This is why order was placed again.

## 2020-06-06 NOTE — Telephone Encounter (Signed)
Crystal from [place] calls to inform us that pt does not qualify for a CT lung screen.  Christen Bame, CMA

## 2020-06-07 NOTE — Telephone Encounter (Signed)
Hey [name],  Can you please help is to understand the discrepancy in information?

## 2020-06-11 ENCOUNTER — Ambulatory Visit: Payer: Medicare HMO | Admitting: Family Medicine

## 2020-06-11 ENCOUNTER — Telehealth: Payer: Self-pay | Admitting: Family Medicine

## 2020-06-11 DIAGNOSIS — I499 Cardiac arrhythmia, unspecified: Secondary | ICD-10-CM | POA: Diagnosis not present

## 2020-06-11 DIAGNOSIS — Z743 Need for continuous supervision: Secondary | ICD-10-CM | POA: Diagnosis not present

## 2020-06-11 DIAGNOSIS — J9690 Respiratory failure, unspecified, unspecified whether with hypoxia or hypercapnia: Secondary | ICD-10-CM | POA: Diagnosis not present

## 2020-06-11 DIAGNOSIS — R404 Transient alteration of awareness: Secondary | ICD-10-CM | POA: Diagnosis not present

## 2020-06-11 DIAGNOSIS — R4020 Unspecified coma: Secondary | ICD-10-CM | POA: Diagnosis not present

## 2020-06-11 NOTE — Progress Notes (Deleted)
   Subjective:   Patient ID: [name]    DOB: Jan 08, 1956, 64 y.o. female   MRN: [id number]  [name] is a 64 y.o. female with a history of chronic combined CHF, NICM, HTN, valvular heart disease, COPD with emphysema, GERD, HLD, peripheral neuropathy, uncontrolled T2DM, fibromyalgia, h/o tobacco abuse, malnutrition of moderate degree, severe MDD here for Dizziness  Dizziness:  She was evaluate din [facility] ED for similar dizziness felt to be secondary to orthostatic hypotension in setting of recent increase in antihypertensives and concurrent diuretic use. She was given IV fluids with improvement in symptoms.  Health Maintenance: Health Maintenance Due  Topic  . OPHTHALMOLOGY EXAM   . MAMMOGRAM   . INFLUENZA VACCINE   . FOOT EXAM   . COVID-19 Vaccine (3 - Booster for Moderna series)  . DEXA SCAN    Review of Systems:  Per HPI.   Objective:   There were no vitals taken for this visit. Vitals and nursing note reviewed.  Physical Exam:  General: well nourished, well developed, in no acute distress with non-toxic appearance HEENT: normocephalic, atraumatic, moist mucous membranes Neck: supple, non-tender without lymphadenopathy CV: regular rate and rhythm without murmurs, rubs, or gallops, no lower extremity edema Lungs: clear to auscultation bilaterally with normal work of breathing Abdomen: soft, non-tender, non-distended, no masses or organomegaly palpable, normoactive bowel sounds Skin: warm, dry, no rashes or lesions Extremities: warm and well perfused, normal tone MSK: ROM grossly intact, strength intact, gait normal Neuro: Alert and oriented, speech normal Special Test: [employer]  Eye Exam: Fundi and PERRL  Neuro: strength and coordination (romberg, finger-nose, heel-shin)  Plan Options: - Rx Meclizine - Referral to vestibular rehab - obtaining CBC to rule out anemia as a contributing cause - obtaining CMP to rule out electrolyte  abnormalities and hypoglycemia as potential contributors to her symptoms   Assessment & Plan:   No problem-specific Assessment & Plan notes found for this encounter.  No orders of the defined types were placed in this encounter.  No orders of the defined types were placed in this encounter.  Follow up 1 month for DM, HTN, and HLD follow up and labs  Mercedes Marble, DO PGY-3, [facility] 06/12/2020 8:13 AM

## 2020-06-11 NOTE — Telephone Encounter (Signed)
Received a page from EMS.  EMS reported that they were called to the house of [name] earlier that night due to cardiac arrest.  After performing CPR they were not successful in achieving ROSC.  She was declared dead at the scene.  EMS was calling to inform us that her death certificate would be sent to the PCP.

## 2020-06-16 DEATH — deceased

## 2020-06-19 ENCOUNTER — Telehealth: Payer: Self-pay | Admitting: Family Medicine

## 2020-06-19 NOTE — Telephone Encounter (Signed)
Death Certificate was dropped off by [facility] and has been placed in PCP's box for completion. Please use blue or black ink. Please return to [facility] once completed.

## 2020-06-22 NOTE — Telephone Encounter (Signed)
[employer] was notified by [employer]. DC was picked up by [facility]. [facility] was notified of the death.

## 2020-07-10 NOTE — Telephone Encounter (Signed)
Death Certificate was returned by [facility] and has been placed in PCP's box for completion (redo). Please use blue or black ink. Please return to [facility] once completed.

## 2020-07-12 ENCOUNTER — Telehealth: Payer: Self-pay | Admitting: Family Medicine

## 2020-07-12 NOTE — Telephone Encounter (Signed)
[facility] Representative came to pick up DC. DC is invalid because  the information was crossed through. Rep. will provide additional  DC for correction.  Concern this is the fourth invalid correction, which is  inconvenient to the family.   Note regarding DC place in December note in error.

## 2020-07-12 NOTE — Telephone Encounter (Signed)
Completed and handed to Ms. Burton.

## 2020-07-12 NOTE — Telephone Encounter (Signed)
A new Death Certificate was dropped off by [facility] and has been placed in PCP's box for completion. Please use blue or black ink. Please return to [facility] once completed.

## 2020-07-12 NOTE — Telephone Encounter (Signed)
[facility] Representative came to pick up DC. DC is invalid because  the information was crossed through. Rep. Will provide additional  DC for correction.  Concern this is the fourth invalid correction, which is  inconvenient to the family.

## 2020-07-13 ENCOUNTER — Telehealth: Payer: Self-pay | Admitting: Family Medicine

## 2020-07-13 NOTE — Telephone Encounter (Signed)
Death Certificate completed and [facility] called for pick-up.  [facility] Practice Administrator notified
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
## 2023-09-09 NOTE — Telephone Encounter (Signed)
error
